# Patient Record
Sex: Male | Born: 1960 | Race: White | Hispanic: No | Marital: Married | Smoking: Never smoker
Health system: Southern US, Community
[De-identification: ages and names within clinical notes are randomized; demographics above are authoritative.]

## PROBLEM LIST (undated history)

## (undated) DIAGNOSIS — K219 Gastro-esophageal reflux disease without esophagitis: Secondary | ICD-10-CM

## (undated) DIAGNOSIS — I4719 Other supraventricular tachycardia: Secondary | ICD-10-CM

## (undated) DIAGNOSIS — J189 Pneumonia, unspecified organism: Secondary | ICD-10-CM

## (undated) DIAGNOSIS — Z9889 Other specified postprocedural states: Secondary | ICD-10-CM

## (undated) DIAGNOSIS — I7121 Aneurysm of the ascending aorta, without rupture: Secondary | ICD-10-CM

## (undated) DIAGNOSIS — M1990 Unspecified osteoarthritis, unspecified site: Secondary | ICD-10-CM

## (undated) DIAGNOSIS — I350 Nonrheumatic aortic (valve) stenosis: Secondary | ICD-10-CM

## (undated) DIAGNOSIS — N529 Male erectile dysfunction, unspecified: Secondary | ICD-10-CM

## (undated) DIAGNOSIS — I480 Paroxysmal atrial fibrillation: Secondary | ICD-10-CM

## (undated) DIAGNOSIS — Z8719 Personal history of other diseases of the digestive system: Secondary | ICD-10-CM

## (undated) DIAGNOSIS — G4733 Obstructive sleep apnea (adult) (pediatric): Secondary | ICD-10-CM

## (undated) DIAGNOSIS — B159 Hepatitis A without hepatic coma: Secondary | ICD-10-CM

## (undated) DIAGNOSIS — Z87442 Personal history of urinary calculi: Secondary | ICD-10-CM

## (undated) DIAGNOSIS — I10 Essential (primary) hypertension: Secondary | ICD-10-CM

## (undated) DIAGNOSIS — K8020 Calculus of gallbladder without cholecystitis without obstruction: Secondary | ICD-10-CM

## (undated) DIAGNOSIS — I2510 Atherosclerotic heart disease of native coronary artery without angina pectoris: Secondary | ICD-10-CM

## (undated) DIAGNOSIS — F909 Attention-deficit hyperactivity disorder, unspecified type: Secondary | ICD-10-CM

## (undated) DIAGNOSIS — R51 Headache: Secondary | ICD-10-CM

## (undated) DIAGNOSIS — R011 Cardiac murmur, unspecified: Secondary | ICD-10-CM

## (undated) DIAGNOSIS — I471 Supraventricular tachycardia: Secondary | ICD-10-CM

## (undated) DIAGNOSIS — I712 Thoracic aortic aneurysm, without rupture: Secondary | ICD-10-CM

## (undated) DIAGNOSIS — U071 COVID-19: Secondary | ICD-10-CM

## (undated) DIAGNOSIS — I509 Heart failure, unspecified: Secondary | ICD-10-CM

## (undated) DIAGNOSIS — I499 Cardiac arrhythmia, unspecified: Secondary | ICD-10-CM

## (undated) HISTORY — DX: Nonrheumatic aortic (valve) stenosis: I35.0

## (undated) HISTORY — DX: Hepatitis a without hepatic coma: B15.9

## (undated) HISTORY — PX: KNEE SURGERY: SHX244

## (undated) HISTORY — PX: OTHER SURGICAL HISTORY: SHX169

## (undated) HISTORY — DX: Calculus of gallbladder without cholecystitis without obstruction: K80.20

## (undated) HISTORY — DX: Obstructive sleep apnea (adult) (pediatric): G47.33

## (undated) HISTORY — PX: LUMBAR FUSION: SHX111

## (undated) HISTORY — DX: Thoracic aortic aneurysm, without rupture: I71.2

## (undated) HISTORY — PX: CEREBRAL ANEURYSM REPAIR: SHX164

## (undated) HISTORY — DX: Paroxysmal atrial fibrillation: I48.0

## (undated) HISTORY — DX: Aneurysm of the ascending aorta, without rupture: I71.21

## (undated) HISTORY — DX: Atherosclerotic heart disease of native coronary artery without angina pectoris: I25.10

---
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
## 1998-07-04 ENCOUNTER — Emergency Department (HOSPITAL_COMMUNITY): Admission: EM | Admit: 1998-07-04 | Discharge: 1998-07-04 | Payer: Self-pay | Admitting: Emergency Medicine

## 1998-07-29 ENCOUNTER — Encounter: Admission: RE | Admit: 1998-07-29 | Discharge: 1998-07-29 | Payer: Self-pay | Admitting: Infectious Diseases

## 1998-07-31 ENCOUNTER — Inpatient Hospital Stay (HOSPITAL_COMMUNITY): Admission: AD | Admit: 1998-07-31 | Discharge: 1998-08-05 | Payer: Self-pay | Admitting: Internal Medicine

## 1998-08-05 ENCOUNTER — Encounter: Payer: Self-pay | Admitting: Internal Medicine

## 1998-08-12 ENCOUNTER — Encounter: Payer: Self-pay | Admitting: Infectious Diseases

## 1998-08-12 ENCOUNTER — Encounter: Admission: RE | Admit: 1998-08-12 | Discharge: 1998-08-12 | Payer: Self-pay | Admitting: Infectious Diseases

## 1998-08-12 ENCOUNTER — Ambulatory Visit (HOSPITAL_COMMUNITY): Admission: RE | Admit: 1998-08-12 | Discharge: 1998-08-12 | Payer: Self-pay | Admitting: Infectious Diseases

## 1998-08-19 ENCOUNTER — Inpatient Hospital Stay (HOSPITAL_COMMUNITY): Admission: RE | Admit: 1998-08-19 | Discharge: 1998-08-20 | Payer: Self-pay | Admitting: *Deleted

## 1998-08-19 ENCOUNTER — Encounter: Payer: Self-pay | Admitting: *Deleted

## 1998-08-28 ENCOUNTER — Encounter: Admission: RE | Admit: 1998-08-28 | Discharge: 1998-08-28 | Payer: Self-pay | Admitting: Infectious Diseases

## 1998-09-06 ENCOUNTER — Encounter: Admission: RE | Admit: 1998-09-06 | Discharge: 1998-09-06 | Payer: Self-pay | Admitting: Infectious Diseases

## 1998-10-25 ENCOUNTER — Encounter: Admission: RE | Admit: 1998-10-25 | Discharge: 1998-10-25 | Payer: Self-pay | Admitting: Infectious Diseases

## 1998-10-28 ENCOUNTER — Encounter: Payer: Self-pay | Admitting: Infectious Diseases

## 1998-10-28 ENCOUNTER — Ambulatory Visit (HOSPITAL_COMMUNITY): Admission: RE | Admit: 1998-10-28 | Discharge: 1998-10-28 | Payer: Self-pay | Admitting: Infectious Diseases

## 1999-03-13 ENCOUNTER — Encounter: Admission: RE | Admit: 1999-03-13 | Discharge: 1999-03-13 | Payer: Self-pay | Admitting: Infectious Diseases

## 1999-03-14 ENCOUNTER — Encounter: Payer: Self-pay | Admitting: Infectious Diseases

## 1999-03-14 ENCOUNTER — Ambulatory Visit (HOSPITAL_COMMUNITY): Admission: RE | Admit: 1999-03-14 | Discharge: 1999-03-14 | Payer: Self-pay | Admitting: Infectious Diseases

## 2000-05-31 ENCOUNTER — Encounter: Admission: RE | Admit: 2000-05-31 | Discharge: 2000-05-31 | Payer: Self-pay | Admitting: Internal Medicine

## 2001-05-05 ENCOUNTER — Encounter: Admission: RE | Admit: 2001-05-05 | Discharge: 2001-05-05 | Payer: Self-pay | Admitting: Internal Medicine

## 2001-10-29 ENCOUNTER — Emergency Department (HOSPITAL_COMMUNITY): Admission: EM | Admit: 2001-10-29 | Discharge: 2001-10-29 | Payer: Self-pay | Admitting: *Deleted

## 2002-05-18 ENCOUNTER — Ambulatory Visit (HOSPITAL_COMMUNITY): Admission: RE | Admit: 2002-05-18 | Discharge: 2002-05-18 | Payer: Self-pay | Admitting: Neurosurgery

## 2002-05-18 ENCOUNTER — Encounter: Payer: Self-pay | Admitting: Neurosurgery

## 2002-07-05 ENCOUNTER — Encounter: Payer: Self-pay | Admitting: Neurosurgery

## 2002-07-05 ENCOUNTER — Inpatient Hospital Stay (HOSPITAL_COMMUNITY): Admission: RE | Admit: 2002-07-05 | Discharge: 2002-07-09 | Payer: Self-pay | Admitting: Neurosurgery

## 2002-09-28 ENCOUNTER — Encounter: Admission: RE | Admit: 2002-09-28 | Discharge: 2002-12-27 | Payer: Self-pay | Admitting: Neurosurgery

## 2004-04-25 ENCOUNTER — Encounter: Admission: RE | Admit: 2004-04-25 | Discharge: 2004-04-25 | Payer: Self-pay | Admitting: Internal Medicine

## 2004-07-04 ENCOUNTER — Ambulatory Visit: Payer: Self-pay | Admitting: Cardiovascular Disease

## 2004-10-10 ENCOUNTER — Ambulatory Visit: Payer: Self-pay

## 2004-10-24 ENCOUNTER — Ambulatory Visit: Payer: Self-pay | Admitting: Cardiovascular Disease

## 2004-11-26 ENCOUNTER — Emergency Department (HOSPITAL_COMMUNITY): Admission: EM | Admit: 2004-11-26 | Discharge: 2004-11-27 | Payer: Self-pay | Admitting: Emergency Medicine

## 2004-12-01 ENCOUNTER — Ambulatory Visit: Payer: Self-pay | Admitting: Cardiology

## 2004-12-09 ENCOUNTER — Ambulatory Visit: Payer: Self-pay

## 2004-12-09 ENCOUNTER — Ambulatory Visit: Payer: Self-pay | Admitting: Internal Medicine

## 2004-12-23 ENCOUNTER — Ambulatory Visit: Payer: Self-pay | Admitting: Internal Medicine

## 2004-12-29 ENCOUNTER — Ambulatory Visit: Payer: Self-pay | Admitting: Internal Medicine

## 2005-05-06 ENCOUNTER — Ambulatory Visit: Payer: Self-pay | Admitting: Cardiology

## 2005-05-14 ENCOUNTER — Ambulatory Visit: Payer: Self-pay | Admitting: Internal Medicine

## 2005-06-26 ENCOUNTER — Ambulatory Visit: Payer: Self-pay | Admitting: Pulmonary Disease

## 2005-06-29 ENCOUNTER — Ambulatory Visit (HOSPITAL_BASED_OUTPATIENT_CLINIC_OR_DEPARTMENT_OTHER): Admission: RE | Admit: 2005-06-29 | Discharge: 2005-06-29 | Payer: Self-pay | Admitting: Pulmonary Disease

## 2005-07-01 ENCOUNTER — Ambulatory Visit: Payer: Self-pay | Admitting: Pulmonary Disease

## 2005-07-03 ENCOUNTER — Ambulatory Visit: Payer: Self-pay | Admitting: Internal Medicine

## 2005-07-10 ENCOUNTER — Ambulatory Visit: Payer: Self-pay | Admitting: Cardiology

## 2005-07-17 ENCOUNTER — Ambulatory Visit: Payer: Self-pay | Admitting: Cardiology

## 2005-07-17 ENCOUNTER — Ambulatory Visit: Payer: Self-pay | Admitting: Pulmonary Disease

## 2005-09-11 ENCOUNTER — Ambulatory Visit: Payer: Self-pay | Admitting: Family Medicine

## 2005-10-02 ENCOUNTER — Ambulatory Visit: Payer: Self-pay | Admitting: Internal Medicine

## 2005-11-06 ENCOUNTER — Ambulatory Visit: Payer: Self-pay | Admitting: Internal Medicine

## 2005-11-13 ENCOUNTER — Ambulatory Visit: Payer: Self-pay | Admitting: Cardiovascular Disease

## 2005-11-19 ENCOUNTER — Ambulatory Visit: Payer: Self-pay | Admitting: Cardiology

## 2005-12-29 ENCOUNTER — Ambulatory Visit: Payer: Self-pay | Admitting: Internal Medicine

## 2005-12-30 ENCOUNTER — Encounter: Payer: Self-pay | Admitting: Internal Medicine

## 2006-01-08 ENCOUNTER — Ambulatory Visit: Payer: Self-pay | Admitting: Cardiovascular Disease

## 2006-01-28 ENCOUNTER — Ambulatory Visit: Payer: Self-pay | Admitting: Internal Medicine

## 2006-02-05 ENCOUNTER — Ambulatory Visit: Payer: Self-pay | Admitting: Cardiology

## 2006-02-12 ENCOUNTER — Ambulatory Visit: Payer: Self-pay | Admitting: Cardiology

## 2006-03-19 ENCOUNTER — Ambulatory Visit: Payer: Self-pay | Admitting: Internal Medicine

## 2006-04-13 ENCOUNTER — Ambulatory Visit: Payer: Self-pay | Admitting: Cardiology

## 2006-04-15 ENCOUNTER — Ambulatory Visit (HOSPITAL_COMMUNITY): Admission: RE | Admit: 2006-04-15 | Discharge: 2006-04-15 | Payer: Self-pay | Admitting: Neurosurgery

## 2006-04-19 ENCOUNTER — Ambulatory Visit: Payer: Self-pay

## 2006-04-19 ENCOUNTER — Ambulatory Visit: Payer: Self-pay | Admitting: Internal Medicine

## 2006-04-29 ENCOUNTER — Ambulatory Visit: Payer: Self-pay | Admitting: Internal Medicine

## 2006-05-07 ENCOUNTER — Ambulatory Visit: Payer: Self-pay | Admitting: Cardiology

## 2006-05-14 ENCOUNTER — Ambulatory Visit: Payer: Self-pay | Admitting: Cardiology

## 2006-05-21 ENCOUNTER — Ambulatory Visit: Payer: Self-pay | Admitting: Cardiovascular Disease

## 2006-06-07 ENCOUNTER — Ambulatory Visit: Payer: Self-pay | Admitting: Cardiovascular Disease

## 2006-06-07 ENCOUNTER — Ambulatory Visit: Payer: Self-pay | Admitting: Internal Medicine

## 2006-06-11 ENCOUNTER — Ambulatory Visit: Payer: Self-pay | Admitting: Pulmonary Disease

## 2006-06-25 ENCOUNTER — Ambulatory Visit: Payer: Self-pay | Admitting: Internal Medicine

## 2006-07-02 ENCOUNTER — Ambulatory Visit: Payer: Self-pay

## 2006-07-02 ENCOUNTER — Encounter: Payer: Self-pay | Admitting: Internal Medicine

## 2006-08-04 ENCOUNTER — Ambulatory Visit: Payer: Self-pay | Admitting: Cardiology

## 2006-10-08 ENCOUNTER — Ambulatory Visit: Payer: Self-pay | Admitting: Internal Medicine

## 2006-10-22 ENCOUNTER — Ambulatory Visit: Payer: Self-pay | Admitting: Internal Medicine

## 2006-11-12 ENCOUNTER — Ambulatory Visit: Payer: Self-pay | Admitting: Cardiovascular Disease

## 2006-11-12 ENCOUNTER — Ambulatory Visit: Payer: Self-pay | Admitting: Internal Medicine

## 2006-11-18 ENCOUNTER — Ambulatory Visit: Payer: Self-pay | Admitting: Cardiology

## 2006-11-18 LAB — CONVERTED CEMR LAB
INR: 4.4 — ABNORMAL HIGH (ref 0.9–2.0)
Prothrombin Time: 26.8 s — ABNORMAL HIGH (ref 10.0–14.0)

## 2007-03-17 ENCOUNTER — Ambulatory Visit: Payer: Self-pay | Admitting: Cardiology

## 2007-03-28 ENCOUNTER — Ambulatory Visit: Payer: Self-pay | Admitting: Internal Medicine

## 2007-03-28 ENCOUNTER — Ambulatory Visit: Payer: Self-pay | Admitting: Cardiology

## 2007-04-08 ENCOUNTER — Ambulatory Visit: Payer: Self-pay | Admitting: Internal Medicine

## 2007-04-08 ENCOUNTER — Ambulatory Visit: Payer: Self-pay | Admitting: Cardiology

## 2007-04-08 LAB — CONVERTED CEMR LAB
ALT: 30 units/L (ref 0–53)
AST: 22 units/L (ref 0–37)
Albumin: 4.2 g/dL (ref 3.5–5.2)
Alkaline Phosphatase: 60 units/L (ref 39–117)
BUN: 14 mg/dL (ref 6–23)
Bilirubin, Direct: 0.1 mg/dL (ref 0.0–0.3)
CO2: 30 meq/L (ref 19–32)
Calcium: 9.2 mg/dL (ref 8.4–10.5)
Chloride: 100 meq/L (ref 96–112)
Cholesterol: 242 mg/dL (ref 0–200)
Creatinine, Ser: 1.1 mg/dL (ref 0.4–1.5)
Direct LDL: 175 mg/dL
GFR calc Af Amer: 94 mL/min
GFR calc non Af Amer: 78 mL/min
Glucose, Bld: 105 mg/dL — ABNORMAL HIGH (ref 70–99)
HDL: 34.9 mg/dL — ABNORMAL LOW (ref >39.0)
Potassium: 4.9 meq/L (ref 3.5–5.1)
Sodium: 135 meq/L (ref 135–145)
Total Bilirubin: 2.1 mg/dL — ABNORMAL HIGH (ref 0.3–1.2)
Total CHOL/HDL Ratio: 6.9
Total Protein: 6.8 g/dL (ref 6.0–8.3)
Triglycerides: 145 mg/dL (ref 0–149)
VLDL: 29 mg/dL (ref 0–40)

## 2007-04-22 ENCOUNTER — Ambulatory Visit: Payer: Self-pay | Admitting: Cardiology

## 2007-05-06 ENCOUNTER — Ambulatory Visit: Payer: Self-pay | Admitting: Cardiology

## 2007-06-17 ENCOUNTER — Ambulatory Visit: Payer: Self-pay | Admitting: Cardiovascular Disease

## 2007-06-17 ENCOUNTER — Encounter: Payer: Self-pay | Admitting: Internal Medicine

## 2007-06-17 ENCOUNTER — Ambulatory Visit: Payer: Self-pay

## 2007-07-07 ENCOUNTER — Ambulatory Visit: Payer: Self-pay | Admitting: Internal Medicine

## 2007-07-07 LAB — CONVERTED CEMR LAB
ALT: 33 units/L (ref 0–53)
AST: 24 units/L (ref 0–37)
Albumin: 3.9 g/dL (ref 3.5–5.2)
Alkaline Phosphatase: 54 units/L (ref 39–117)
BUN: 15 mg/dL (ref 6–23)
Bilirubin, Direct: 0.1 mg/dL (ref 0.0–0.3)
CO2: 30 meq/L (ref 19–32)
Calcium: 9.4 mg/dL (ref 8.4–10.5)
Chloride: 107 meq/L (ref 96–112)
Cholesterol: 213 mg/dL (ref 0–200)
Creatinine, Ser: 1.1 mg/dL (ref 0.4–1.5)
Direct LDL: 148.3 mg/dL
GFR calc Af Amer: 94 mL/min
GFR calc non Af Amer: 77 mL/min
Glucose, Bld: 95 mg/dL (ref 70–99)
HDL: 33.3 mg/dL — ABNORMAL LOW (ref >39.0)
Potassium: 5.1 meq/L (ref 3.5–5.1)
Sodium: 141 meq/L (ref 135–145)
Total Bilirubin: 1.1 mg/dL (ref 0.3–1.2)
Total CHOL/HDL Ratio: 6.4
Total Protein: 6.6 g/dL (ref 6.0–8.3)
Triglycerides: 138 mg/dL (ref 0–149)
VLDL: 28 mg/dL (ref 0–40)

## 2007-07-13 ENCOUNTER — Ambulatory Visit: Payer: Self-pay | Admitting: Internal Medicine

## 2007-09-15 ENCOUNTER — Ambulatory Visit: Payer: Self-pay | Admitting: Internal Medicine

## 2007-10-14 ENCOUNTER — Ambulatory Visit: Payer: Self-pay | Admitting: Cardiology

## 2007-10-14 LAB — CONVERTED CEMR LAB
INR: 4.6 — ABNORMAL HIGH (ref 0.8–1.0)
Prothrombin Time: 27.5 s — ABNORMAL HIGH (ref 10.9–13.3)

## 2007-10-21 ENCOUNTER — Ambulatory Visit: Payer: Self-pay | Admitting: Internal Medicine

## 2007-10-21 ENCOUNTER — Encounter: Payer: Self-pay | Admitting: Internal Medicine

## 2007-10-21 ENCOUNTER — Ambulatory Visit: Payer: Self-pay

## 2007-10-21 LAB — CONVERTED CEMR LAB
ALT: 51 units/L (ref 0–53)
AST: 35 units/L (ref 0–37)
Albumin: 4.1 g/dL (ref 3.5–5.2)
Alkaline Phosphatase: 59 units/L (ref 39–117)
BUN: 14 mg/dL (ref 6–23)
Bilirubin, Direct: 0.2 mg/dL (ref 0.0–0.3)
CO2: 31 meq/L (ref 19–32)
Calcium: 9.4 mg/dL (ref 8.4–10.5)
Chloride: 107 meq/L (ref 96–112)
Cholesterol: 179 mg/dL (ref 0–200)
Creatinine, Ser: 1 mg/dL (ref 0.4–1.5)
GFR calc Af Amer: 104 mL/min
GFR calc non Af Amer: 86 mL/min
Glucose, Bld: 93 mg/dL (ref 70–99)
HDL: 36.2 mg/dL — ABNORMAL LOW (ref >39.0)
LDL Cholesterol: 119 mg/dL — ABNORMAL HIGH (ref 0–99)
Potassium: 4.4 meq/L (ref 3.5–5.1)
Sodium: 141 meq/L (ref 135–145)
Total Bilirubin: 1.7 mg/dL — ABNORMAL HIGH (ref 0.3–1.2)
Total CHOL/HDL Ratio: 4.9
Total Protein: 6.8 g/dL (ref 6.0–8.3)
Triglycerides: 118 mg/dL (ref 0–149)
VLDL: 24 mg/dL (ref 0–40)

## 2007-11-25 ENCOUNTER — Ambulatory Visit: Payer: Self-pay | Admitting: Cardiology

## 2007-12-26 ENCOUNTER — Ambulatory Visit: Payer: Self-pay | Admitting: Thoracic Surgery (Cardiothoracic Vascular Surgery)

## 2008-01-03 ENCOUNTER — Ambulatory Visit: Payer: Self-pay | Admitting: Internal Medicine

## 2008-01-03 LAB — CONVERTED CEMR LAB
BUN: 15 mg/dL (ref 6–23)
Basophils Absolute: 0 10*3/uL (ref 0.0–0.1)
Basophils Relative: 0 % (ref 0.0–1.0)
CO2: 31 meq/L (ref 19–32)
Calcium: 9.1 mg/dL (ref 8.4–10.5)
Chloride: 105 meq/L (ref 96–112)
Creatinine, Ser: 0.9 mg/dL (ref 0.4–1.5)
Eosinophils Absolute: 0.1 10*3/uL (ref 0.0–0.7)
Eosinophils Relative: 1.5 % (ref 0.0–5.0)
GFR calc Af Amer: 118 mL/min
GFR calc non Af Amer: 97 mL/min
Glucose, Bld: 100 mg/dL — ABNORMAL HIGH (ref 70–99)
HCT: 39.2 % (ref 39.0–52.0)
Hemoglobin: 13.3 g/dL (ref 13.0–17.0)
INR: 1.5 — ABNORMAL HIGH (ref 0.8–1.0)
Lymphocytes Relative: 39.9 % (ref 12.0–46.0)
MCHC: 33.9 g/dL (ref 30.0–36.0)
MCV: 91.7 fL (ref 78.0–100.0)
Monocytes Absolute: 0.2 10*3/uL (ref 0.1–1.0)
Monocytes Relative: 4.3 % (ref 3.0–12.0)
Neutro Abs: 2.7 10*3/uL (ref 1.4–7.7)
Neutrophils Relative %: 54.3 % (ref 43.0–77.0)
Platelets: 154 10*3/uL (ref 150–400)
Potassium: 4.4 meq/L (ref 3.5–5.1)
Prothrombin Time: 17.5 s — ABNORMAL HIGH (ref 10.9–13.3)
RBC: 4.28 M/uL (ref 4.22–5.81)
RDW: 12.1 % (ref 11.5–14.6)
Sodium: 141 meq/L (ref 135–145)
WBC: 5 10*3/uL (ref 4.5–10.5)
aPTT: 39.7 s — ABNORMAL HIGH (ref 21.7–29.8)

## 2008-01-05 ENCOUNTER — Encounter: Payer: Self-pay | Admitting: Thoracic Surgery (Cardiothoracic Vascular Surgery)

## 2008-01-05 ENCOUNTER — Ambulatory Visit: Payer: Self-pay | Admitting: Internal Medicine

## 2008-01-05 ENCOUNTER — Inpatient Hospital Stay (HOSPITAL_BASED_OUTPATIENT_CLINIC_OR_DEPARTMENT_OTHER): Admission: RE | Admit: 2008-01-05 | Discharge: 2008-01-05 | Payer: Self-pay | Admitting: Internal Medicine

## 2008-01-05 ENCOUNTER — Ambulatory Visit (HOSPITAL_COMMUNITY): Admission: RE | Admit: 2008-01-05 | Discharge: 2008-01-05 | Payer: Self-pay | Admitting: Internal Medicine

## 2008-01-09 ENCOUNTER — Ambulatory Visit: Payer: Self-pay | Admitting: Cardiology

## 2008-01-09 ENCOUNTER — Ambulatory Visit: Payer: Self-pay | Admitting: Thoracic Surgery (Cardiothoracic Vascular Surgery)

## 2008-01-09 ENCOUNTER — Encounter: Payer: Self-pay | Admitting: Internal Medicine

## 2008-01-09 LAB — CONVERTED CEMR LAB
INR: 1.3 — ABNORMAL HIGH (ref 0.8–1.0)
Prothrombin Time: 14.9 s — ABNORMAL HIGH (ref 10.9–13.3)

## 2008-01-12 ENCOUNTER — Ambulatory Visit: Payer: Self-pay | Admitting: Internal Medicine

## 2008-02-18 ENCOUNTER — Emergency Department (HOSPITAL_COMMUNITY): Admission: EM | Admit: 2008-02-18 | Discharge: 2008-02-18 | Payer: Self-pay | Admitting: Emergency Medicine

## 2008-03-13 ENCOUNTER — Ambulatory Visit: Payer: Self-pay | Admitting: Internal Medicine

## 2008-04-20 ENCOUNTER — Ambulatory Visit: Payer: Self-pay | Admitting: Internal Medicine

## 2008-06-15 ENCOUNTER — Ambulatory Visit: Payer: Self-pay | Admitting: Cardiology

## 2008-07-04 ENCOUNTER — Ambulatory Visit: Payer: Self-pay | Admitting: Internal Medicine

## 2008-07-04 LAB — CONVERTED CEMR LAB
ALT: 30 units/L (ref 0–53)
AST: 28 units/L (ref 0–37)
Albumin: 4.1 g/dL (ref 3.5–5.2)
Alkaline Phosphatase: 56 units/L (ref 39–117)
BUN: 20 mg/dL (ref 6–23)
Bilirubin, Direct: 0.3 mg/dL (ref 0.0–0.3)
CO2: 27 meq/L (ref 19–32)
CRP, High Sensitivity: <1 — ABNORMAL LOW (ref 0.00–5.00)
Calcium: 8.9 mg/dL (ref 8.4–10.5)
Chloride: 107 meq/L (ref 96–112)
Creatinine, Ser: 1.2 mg/dL (ref 0.4–1.5)
GFR calc Af Amer: 84 mL/min
GFR calc non Af Amer: 70 mL/min
Glucose, Bld: 123 mg/dL — ABNORMAL HIGH (ref 70–99)
Potassium: 4.2 meq/L (ref 3.5–5.1)
Sodium: 139 meq/L (ref 135–145)
Total Bilirubin: 1.5 mg/dL — ABNORMAL HIGH (ref 0.3–1.2)
Total Protein: 6.6 g/dL (ref 6.0–8.3)

## 2008-07-11 ENCOUNTER — Ambulatory Visit: Payer: Self-pay | Admitting: Internal Medicine

## 2008-07-11 ENCOUNTER — Encounter: Payer: Self-pay | Admitting: Internal Medicine

## 2008-07-11 ENCOUNTER — Ambulatory Visit: Payer: Self-pay

## 2008-07-11 ENCOUNTER — Ambulatory Visit: Payer: Self-pay | Admitting: Cardiology

## 2008-07-18 ENCOUNTER — Ambulatory Visit: Payer: Self-pay

## 2008-07-18 ENCOUNTER — Ambulatory Visit: Payer: Self-pay | Admitting: Cardiology

## 2008-07-18 ENCOUNTER — Ambulatory Visit: Payer: Self-pay | Admitting: Internal Medicine

## 2008-07-18 LAB — CONVERTED CEMR LAB
Cholesterol: 184 mg/dL (ref 0–200)
HDL: 37.4 mg/dL — ABNORMAL LOW (ref >39.0)
LDL Cholesterol: 123 mg/dL — ABNORMAL HIGH (ref 0–99)
Total CHOL/HDL Ratio: 4.9
Triglycerides: 116 mg/dL (ref 0–149)
VLDL: 23 mg/dL (ref 0–40)

## 2008-07-23 ENCOUNTER — Ambulatory Visit: Payer: Self-pay | Admitting: Thoracic Surgery (Cardiothoracic Vascular Surgery)

## 2008-07-23 ENCOUNTER — Encounter
Admission: RE | Admit: 2008-07-23 | Discharge: 2008-07-23 | Payer: Self-pay | Admitting: Thoracic Surgery (Cardiothoracic Vascular Surgery)

## 2008-10-18 ENCOUNTER — Ambulatory Visit: Payer: Self-pay | Admitting: Internal Medicine

## 2008-11-08 ENCOUNTER — Ambulatory Visit: Payer: Self-pay | Admitting: Cardiovascular Disease

## 2008-11-21 ENCOUNTER — Ambulatory Visit: Payer: Self-pay | Admitting: Internal Medicine

## 2008-11-21 DIAGNOSIS — R509 Fever, unspecified: Secondary | ICD-10-CM | POA: Insufficient documentation

## 2008-11-21 DIAGNOSIS — I330 Acute and subacute infective endocarditis: Secondary | ICD-10-CM | POA: Insufficient documentation

## 2008-11-21 DIAGNOSIS — R042 Hemoptysis: Secondary | ICD-10-CM | POA: Insufficient documentation

## 2008-11-21 DIAGNOSIS — Z954 Presence of other heart-valve replacement: Secondary | ICD-10-CM | POA: Insufficient documentation

## 2008-11-22 ENCOUNTER — Ambulatory Visit: Payer: Self-pay | Admitting: Cardiology

## 2008-12-03 ENCOUNTER — Ambulatory Visit: Payer: Self-pay | Admitting: Cardiology

## 2008-12-03 LAB — CONVERTED CEMR LAB
INR: 6.9 (ref 0.8–1.0)
Prothrombin Time: 67.5 s (ref 10.9–13.3)

## 2008-12-12 ENCOUNTER — Ambulatory Visit: Payer: Self-pay | Admitting: Cardiovascular Disease

## 2008-12-28 ENCOUNTER — Ambulatory Visit: Payer: Self-pay | Admitting: Cardiology

## 2009-01-11 ENCOUNTER — Ambulatory Visit: Payer: Self-pay | Admitting: Cardiology

## 2009-01-25 ENCOUNTER — Ambulatory Visit: Payer: Self-pay | Admitting: Cardiology

## 2009-01-25 LAB — CONVERTED CEMR LAB
POC INR: 2.3
Protime: 18.5

## 2009-01-29 ENCOUNTER — Encounter: Payer: Self-pay | Admitting: *Deleted

## 2009-03-06 ENCOUNTER — Encounter: Payer: Self-pay | Admitting: *Deleted

## 2009-03-12 ENCOUNTER — Encounter: Payer: Self-pay | Admitting: Internal Medicine

## 2009-03-12 ENCOUNTER — Telehealth: Payer: Self-pay | Admitting: Internal Medicine

## 2009-03-13 ENCOUNTER — Ambulatory Visit: Payer: Self-pay | Admitting: Vascular Surgery

## 2009-03-13 ENCOUNTER — Emergency Department (HOSPITAL_COMMUNITY): Admission: EM | Admit: 2009-03-13 | Discharge: 2009-03-13 | Payer: Self-pay | Admitting: Emergency Medicine

## 2009-03-14 ENCOUNTER — Encounter: Payer: Self-pay | Admitting: Cardiology

## 2009-03-14 ENCOUNTER — Ambulatory Visit: Payer: Self-pay | Admitting: Internal Medicine

## 2009-03-14 ENCOUNTER — Telehealth (INDEPENDENT_AMBULATORY_CARE_PROVIDER_SITE_OTHER): Payer: Self-pay | Admitting: Cardiology

## 2009-03-14 DIAGNOSIS — M79609 Pain in unspecified limb: Secondary | ICD-10-CM | POA: Insufficient documentation

## 2009-03-14 LAB — CONVERTED CEMR LAB
INR: 3
POC INR: 4.8

## 2009-03-15 ENCOUNTER — Telehealth (INDEPENDENT_AMBULATORY_CARE_PROVIDER_SITE_OTHER): Payer: Self-pay | Admitting: *Deleted

## 2009-04-19 ENCOUNTER — Ambulatory Visit: Payer: Self-pay | Admitting: Cardiovascular Disease

## 2009-04-19 LAB — CONVERTED CEMR LAB: POC INR: 4.1

## 2009-05-23 ENCOUNTER — Ambulatory Visit: Payer: Self-pay | Admitting: Cardiology

## 2009-05-23 LAB — CONVERTED CEMR LAB: POC INR: 4.7

## 2009-06-19 ENCOUNTER — Encounter (INDEPENDENT_AMBULATORY_CARE_PROVIDER_SITE_OTHER): Payer: Self-pay | Admitting: Cardiology

## 2009-07-04 ENCOUNTER — Ambulatory Visit: Payer: Self-pay | Admitting: Internal Medicine

## 2009-07-15 ENCOUNTER — Encounter: Payer: Self-pay | Admitting: Internal Medicine

## 2009-07-15 ENCOUNTER — Ambulatory Visit: Payer: Self-pay | Admitting: Thoracic Surgery (Cardiothoracic Vascular Surgery)

## 2009-07-15 ENCOUNTER — Encounter
Admission: RE | Admit: 2009-07-15 | Discharge: 2009-07-15 | Payer: Self-pay | Admitting: Thoracic Surgery (Cardiothoracic Vascular Surgery)

## 2009-08-02 ENCOUNTER — Ambulatory Visit: Payer: Self-pay | Admitting: Cardiology

## 2009-08-02 LAB — CONVERTED CEMR LAB: POC INR: 3.9

## 2009-08-06 ENCOUNTER — Ambulatory Visit: Payer: Self-pay | Admitting: Internal Medicine

## 2009-08-06 DIAGNOSIS — N529 Male erectile dysfunction, unspecified: Secondary | ICD-10-CM | POA: Insufficient documentation

## 2009-09-20 ENCOUNTER — Ambulatory Visit: Payer: Self-pay | Admitting: Cardiovascular Disease

## 2009-09-20 LAB — CONVERTED CEMR LAB: POC INR: 4

## 2009-11-01 ENCOUNTER — Ambulatory Visit: Payer: Self-pay | Admitting: Cardiovascular Disease

## 2009-11-01 LAB — CONVERTED CEMR LAB: POC INR: 3.4

## 2009-11-29 ENCOUNTER — Ambulatory Visit: Payer: Self-pay

## 2009-11-29 LAB — CONVERTED CEMR LAB: POC INR: 3.3

## 2010-02-05 ENCOUNTER — Encounter (INDEPENDENT_AMBULATORY_CARE_PROVIDER_SITE_OTHER): Payer: Self-pay | Admitting: *Deleted

## 2010-02-25 ENCOUNTER — Ambulatory Visit: Payer: Self-pay | Admitting: Cardiology

## 2010-02-25 LAB — CONVERTED CEMR LAB: POC INR: 4.4

## 2010-04-15 ENCOUNTER — Ambulatory Visit: Payer: Self-pay | Admitting: Internal Medicine

## 2010-04-15 ENCOUNTER — Encounter: Payer: Self-pay | Admitting: Internal Medicine

## 2010-04-15 LAB — CONVERTED CEMR LAB: POC INR: 3.6

## 2010-04-16 ENCOUNTER — Encounter: Payer: Self-pay | Admitting: Internal Medicine

## 2010-04-16 ENCOUNTER — Emergency Department (HOSPITAL_COMMUNITY): Admission: EM | Admit: 2010-04-16 | Discharge: 2010-04-17 | Payer: Self-pay | Admitting: Emergency Medicine

## 2010-04-17 LAB — CONVERTED CEMR LAB
ALT: 34 units/L (ref 0–53)
AST: 27 units/L (ref 0–37)
Albumin: 4.1 g/dL (ref 3.5–5.2)
Alkaline Phosphatase: 55 units/L (ref 39–117)
BUN: 15 mg/dL (ref 6–23)
Bilirubin, Direct: 0.1 mg/dL (ref 0.0–0.3)
CO2: 30 meq/L (ref 19–32)
Calcium: 8.8 mg/dL (ref 8.4–10.5)
Chloride: 105 meq/L (ref 96–112)
Cholesterol: 183 mg/dL (ref 0–200)
Creatinine, Ser: 0.9 mg/dL (ref 0.4–1.5)
GFR calc non Af Amer: 91.5 mL/min (ref >60)
Glucose, Bld: 91 mg/dL (ref 70–99)
HDL: 36 mg/dL — ABNORMAL LOW (ref >39.00)
INR: 3.3 — ABNORMAL HIGH (ref 0.8–1.0)
LDL Cholesterol: 126 mg/dL — ABNORMAL HIGH (ref 0–99)
Potassium: 4.6 meq/L (ref 3.5–5.1)
Prothrombin Time: 35.4 s — ABNORMAL HIGH (ref 9.7–11.8)
Sodium: 141 meq/L (ref 135–145)
Total Bilirubin: 1.2 mg/dL (ref 0.3–1.2)
Total CHOL/HDL Ratio: 5
Total Protein: 6.4 g/dL (ref 6.0–8.3)
Triglycerides: 106 mg/dL (ref 0.0–149.0)
VLDL: 21.2 mg/dL (ref 0.0–40.0)

## 2010-04-18 ENCOUNTER — Telehealth: Payer: Self-pay | Admitting: Internal Medicine

## 2010-04-18 ENCOUNTER — Ambulatory Visit: Payer: Self-pay | Admitting: Internal Medicine

## 2010-04-18 DIAGNOSIS — R519 Headache, unspecified: Secondary | ICD-10-CM | POA: Insufficient documentation

## 2010-04-18 DIAGNOSIS — N10 Acute tubulo-interstitial nephritis: Secondary | ICD-10-CM | POA: Insufficient documentation

## 2010-04-18 DIAGNOSIS — R51 Headache: Secondary | ICD-10-CM | POA: Insufficient documentation

## 2010-04-23 ENCOUNTER — Ambulatory Visit: Payer: Self-pay | Admitting: Internal Medicine

## 2010-04-23 LAB — CONVERTED CEMR LAB
INR: 2.1
INR: 2.1 — ABNORMAL HIGH (ref 0.8–1.0)
POC INR: 7
Prothrombin Time: 22 s — ABNORMAL HIGH (ref 9.7–11.8)

## 2010-04-28 ENCOUNTER — Ambulatory Visit: Payer: Self-pay | Admitting: Family Medicine

## 2010-04-28 DIAGNOSIS — S93409A Sprain of unspecified ligament of unspecified ankle, initial encounter: Secondary | ICD-10-CM | POA: Insufficient documentation

## 2010-04-28 LAB — CONVERTED CEMR LAB
Bilirubin Urine: NEGATIVE
Blood in Urine, dipstick: NEGATIVE
Glucose, Urine, Semiquant: NEGATIVE
Nitrite: NEGATIVE
Protein, U semiquant: NEGATIVE
Specific Gravity, Urine: >=1.03
Urobilinogen, UA: 0.2
WBC Urine, dipstick: NEGATIVE
pH: 5

## 2010-05-10 ENCOUNTER — Emergency Department (HOSPITAL_COMMUNITY): Admission: EM | Admit: 2010-05-10 | Discharge: 2010-05-10 | Payer: Self-pay | Admitting: Emergency Medicine

## 2010-05-13 ENCOUNTER — Telehealth: Payer: Self-pay | Admitting: Internal Medicine

## 2010-05-13 ENCOUNTER — Observation Stay (HOSPITAL_COMMUNITY): Admission: EM | Admit: 2010-05-13 | Discharge: 2010-05-13 | Payer: Self-pay | Admitting: Emergency Medicine

## 2010-05-13 ENCOUNTER — Ambulatory Visit: Payer: Self-pay | Admitting: Cardiovascular Disease

## 2010-05-21 ENCOUNTER — Ambulatory Visit: Payer: Self-pay | Admitting: Internal Medicine

## 2010-05-21 DIAGNOSIS — I4891 Unspecified atrial fibrillation: Secondary | ICD-10-CM | POA: Insufficient documentation

## 2010-05-22 ENCOUNTER — Telehealth (INDEPENDENT_AMBULATORY_CARE_PROVIDER_SITE_OTHER): Payer: Self-pay | Admitting: *Deleted

## 2010-05-23 ENCOUNTER — Ambulatory Visit: Payer: Self-pay | Admitting: Internal Medicine

## 2010-05-27 ENCOUNTER — Telehealth: Payer: Self-pay | Admitting: Internal Medicine

## 2010-06-04 ENCOUNTER — Telehealth: Payer: Self-pay | Admitting: Internal Medicine

## 2010-06-09 ENCOUNTER — Ambulatory Visit: Payer: Self-pay | Admitting: Cardiology

## 2010-06-09 ENCOUNTER — Encounter: Admission: RE | Admit: 2010-06-09 | Discharge: 2010-06-09 | Payer: Self-pay | Admitting: Orthopedic Surgery

## 2010-06-09 LAB — CONVERTED CEMR LAB: POC INR: 1.1

## 2010-06-10 ENCOUNTER — Encounter: Admission: RE | Admit: 2010-06-10 | Discharge: 2010-06-10 | Payer: Self-pay | Admitting: Orthopedic Surgery

## 2010-06-13 ENCOUNTER — Telehealth: Payer: Self-pay | Admitting: Pulmonary Disease

## 2010-06-16 ENCOUNTER — Ambulatory Visit: Payer: Self-pay | Admitting: Cardiology

## 2010-06-16 LAB — CONVERTED CEMR LAB: POC INR: 2.2

## 2010-06-19 ENCOUNTER — Ambulatory Visit: Payer: Self-pay | Admitting: Internal Medicine

## 2010-06-19 LAB — CONVERTED CEMR LAB: POC INR: 3.2

## 2010-07-01 ENCOUNTER — Telehealth: Payer: Self-pay | Admitting: Pulmonary Disease

## 2010-07-04 ENCOUNTER — Ambulatory Visit: Payer: Self-pay | Admitting: Cardiology

## 2010-07-04 LAB — CONVERTED CEMR LAB: POC INR: 2.9

## 2010-07-10 ENCOUNTER — Telehealth: Payer: Self-pay | Admitting: Internal Medicine

## 2010-07-18 ENCOUNTER — Telehealth (INDEPENDENT_AMBULATORY_CARE_PROVIDER_SITE_OTHER): Payer: Self-pay | Admitting: Pharmacist

## 2010-07-28 ENCOUNTER — Ambulatory Visit: Payer: Self-pay | Admitting: Thoracic Surgery (Cardiothoracic Vascular Surgery)

## 2010-07-28 ENCOUNTER — Encounter
Admission: RE | Admit: 2010-07-28 | Discharge: 2010-07-28 | Payer: Self-pay | Admitting: Thoracic Surgery (Cardiothoracic Vascular Surgery)

## 2010-07-28 ENCOUNTER — Encounter: Payer: Self-pay | Admitting: Internal Medicine

## 2010-07-28 ENCOUNTER — Ambulatory Visit: Payer: Self-pay | Admitting: Cardiology

## 2010-07-28 LAB — CONVERTED CEMR LAB: POC INR: 4.5

## 2010-08-18 ENCOUNTER — Ambulatory Visit: Payer: Self-pay

## 2010-09-19 ENCOUNTER — Telehealth: Payer: Self-pay | Admitting: Internal Medicine

## 2010-09-21 ENCOUNTER — Encounter: Payer: Self-pay | Admitting: Internal Medicine

## 2010-09-26 ENCOUNTER — Ambulatory Visit: Admit: 2010-09-26 | Payer: Self-pay

## 2010-10-02 NOTE — Letter (Signed)
Summary: [facility]  [facility]   Imported By: Lanelle Bal 07/31/2009 11:47:19  _____________________________________________________________________  External Attachment:    Type:   Image     Comment:   External Document

## 2010-10-02 NOTE — Letter (Signed)
Summary: Appointment - Missed  [facility], Main Office  [address]   [place], [place] [address]   Phone: [phone]  Fax: [phone]     February 05, 2010 MRN: [id number]   NIKAN ELLINGSON [address] [place], [place]  [address]   Dear Mr. [name],  Our records indicate you missed your appointment on 01/31/2010 with Dr.  Gala Romney. It is very important that we reach you to reschedule this appointment. We look forward to participating in your health care needs. Please contact us at the number listed above at your earliest convenience to reschedule this appointment.     Sincerely,   Migdalia Dk [facility] Scheduling Team

## 2010-10-02 NOTE — Progress Notes (Signed)
Summary: couamdin  Phone Note Call from Patient Call back at Home Phone [phone]   Caller: Patient Reason for Call: Talk to Nurse Summary of Call: pt was seen in er last night was told to call and speak with Latrenda Irani regarding his coumadin  Initial call taken by: Lorne Skeens,  March 14, 2009 10:06 AM  Follow-up for Phone Call        Spoke with pt. dosing done, see coumacare note in EMR. Bethena Midget, RN, BSN  March 14, 2009 11:01 AM
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
## 2010-10-02 NOTE — Medication Information (Signed)
Summary: rov/tm  Anticoagulant Therapy  Managed by: Cloyde Reams, RN, BSN Referring MD: Shirlee Limerick Supervising MD: Eden Emms MD, Theron Arista Indication 1: Aortic Valve Disorder (ICD-424.1) Indication 2: Aortic Valve Replacement (ICD-V43.3) Lab Used: [facility] Site: [address] INR POC 3.4 INR RANGE 3.5 - 4.5  Dietary changes: no    Health status changes: no    Bleeding/hemorrhagic complications: no    Recent/future hospitalizations: no    Any changes in medication regimen? no    Recent/future dental: no  Any missed doses?: yes     Details: Missed 1 dose Wednesday night.    Is patient compliant with meds? yes       Allergies (verified): No Known Drug Allergies  Anticoagulation Management History:      The patient is taking warfarin and comes in today for a routine follow up visit.  Negative risk factors for bleeding include an age less than 25 years old.  The bleeding index is 'low risk'.  Negative CHADS2 values include Age > 32 years old.  The start date was 05/15/1982.  His last INR was 3.0.  Anticoagulation responsible provider: Eden Emms MD, Theron Arista.  INR POC: 3.4.  Cuvette Lot#: [id number].  Exp: 11/2010.    Anticoagulation Management Assessment/Plan:      The patient's current anticoagulation dose is Coumadin 10 mg tabs: Take as directed by coumadin clinic..  The target INR is 3.5-4.5.  The next INR is due 11/29/2009.  Anticoagulation instructions were given to patient.  Results were reviewed/authorized by Cloyde Reams, RN, BSN.  He was notified by Cloyde Reams RN.         Prior Anticoagulation Instructions: INR 4.0  Continue on same dosage 15mg  daily except 10mg  on Thursdays.  Recheck in 4 weeks.    Current Anticoagulation Instructions: INR 3.4  Take 2 tablets today then resume same dosage 1.5 tablets daily except 1 tablet on Thursdays.  Recheck in 4 weeks.

## 2010-10-02 NOTE — Medication Information (Signed)
Summary: rov/ewj  Anticoagulant Therapy  Managed by: Cloyde Reams, RN, BSN Referring MD: Shirlee Limerick PCP: none Supervising MD: Myrtis Ser MD, Tinnie Gens Indication 1: Aortic Valve Disorder (ICD-424.1) Indication 2: Aortic Valve Replacement (ICD-V43.3) Lab Used: [facility] Site: [address] INR POC 2.9 INR RANGE 3.5 - 4.5  Dietary changes: no    Health status changes: no    Bleeding/hemorrhagic complications: no    Recent/future hospitalizations: no    Any changes in medication regimen? no    Recent/future dental: no  Any missed doses?: no       Is patient compliant with meds? yes       Allergies: No Known Drug Allergies  Anticoagulation Management History:      The patient is taking warfarin and comes in today for a routine follow up visit.  Negative risk factors for bleeding include an age less than 22 years old.  The bleeding index is 'low risk'.  Negative CHADS2 values include Age > 27 years old.  The start date was 05/15/1982.  His last INR was 2.1 ratio.  Anticoagulation responsible provider: Myrtis Ser MD, Tinnie Gens.  INR POC: 2.9.  Cuvette Lot#: [id number].  Exp: 08/2011.    Anticoagulation Management Assessment/Plan:      The patient's current anticoagulation dose is Coumadin 10 mg tabs: Take as directed by coumadin clinic..  The target INR is 3.5-4.5.  The next INR is due 07/18/2010.  Anticoagulation instructions were given to patient.  Results were reviewed/authorized by Cloyde Reams, RN, BSN.  He was notified by Cloyde Reams, RN, BSN.         Prior Anticoagulation Instructions: INR 3.2  Take 20mg  today, then resume same dosage 15mg  daily except 10mg  on Thursdays.  Recheck in 2 weeks.    Current Anticoagulation Instructions: INR 2.9  Take 2 tablets today, then start taking 1.5 tablets daily.  Recheck in 2 weeks.

## 2010-10-02 NOTE — Progress Notes (Signed)
Summary: Bactrim start/Coumadin refill  Phone Note Call from Patient   Caller: Patient Call For: Coumadin Clinic Summary of Call: Pt called diagnosed with UTI on Wednesday in hospital, given 1 dose of Bactrim Wednesday in hospital, discharged with rx for Bactrim x 10 days started yesterday 04/17/10.  Pt states he has been out of it and has not taken his coumadin x 2 days.  Advised pt to resume normal dosage of Coumadin since he has missed a couple of doses at least.  Pt seeing Dr Gala Romney on 04/23/10 at 3:15pm, made appt to check Coumadin same day prior to Dr Gala Romney appt.  Pt also needs a refill of his Coumadin sent to [facility].   Initial call taken by: Cloyde Reams RN,  April 18, 2010 8:49 AM    Prescriptions: COUMADIN 10 MG TABS (WARFARIN SODIUM) Take as directed by coumadin clinic.  #50 x 1   Entered by:   Cloyde Reams RN   Authorized by:   Dolores Patty, MD, [facility]   Signed by:   Cloyde Reams RN on 04/18/2010   Method used:   Electronically to        [facility] (retail)       [address]       [place], [place]  [address]       Ph: [phone]       Fax: [phone]   RxID:   [id number]

## 2010-10-02 NOTE — Progress Notes (Signed)
Summary: 48 hr Holter monitor  Phone Note Outgoing Call Call back at [facility] Phone [phone]   Call placed by: Stanton Kidney, EMT-P,  May 22, 2010 1:55 PM Call placed to: Patient Action Taken: Phone Call Completed Summary of Call: Pt scheduled 05/23/10 10:00 am for 48 hr Holter monitor. Stanton Kidney, EMT-P  May 22, 2010 1:55 PM
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
## 2010-10-02 NOTE — Medication Information (Signed)
Summary: rov/ewj  Anticoagulant Therapy  Managed by: Cloyde Reams, RN, BSN Referring MD: Shirlee Limerick Supervising MD: Eden Emms MD, Theron Arista Indication 1: Aortic Valve Disorder (ICD-424.1) Indication 2: Aortic Valve Replacement (ICD-V43.3) Lab Used: [facility] [place] Site: [address] INR POC 4.0 INR RANGE 3.5 - 4.5  Dietary changes: no    Health status changes: no    Bleeding/hemorrhagic complications: no    Recent/future hospitalizations: no    Any changes in medication regimen? no    Recent/future dental: no  Any missed doses?: yes     Details: Missed just a couple doses approx 10 days ago  Is patient compliant with meds? yes       Allergies (verified): No Known Drug Allergies  Anticoagulation Management History:      The patient is taking warfarin and comes in today for a routine follow up visit.  Negative risk factors for bleeding include an age less than 32 years old.  The bleeding index is 'low risk'.  Negative CHADS2 values include Age > 7 years old.  The start date was 05/15/1982.  His last INR was 3.0.  Anticoagulation responsible provider: Eden Emms MD, Theron Arista.  INR POC: 4.0.  Cuvette Lot#: [id number].  Exp: 10/2010.    Anticoagulation Management Assessment/Plan:      The patient's current anticoagulation dose is Coumadin 10 mg tabs: Take as directed by coumadin clinic..  The target INR is 3.5-4.5.  The next INR is due 10/18/2009.  Anticoagulation instructions were given to patient.  Results were reviewed/authorized by Cloyde Reams, RN, BSN.  He was notified by Cloyde Reams RN.         Prior Anticoagulation Instructions: INR 3.9 Contine 15mg s everyday except 10mg s on Thursdays. Recheck in 4 weeks.   Current Anticoagulation Instructions: INR 4.0  Continue on same dosage 15mg  daily except 10mg  on Thursdays.  Recheck in 4 weeks.   Prescriptions: COUMADIN 10 MG TABS (WARFARIN SODIUM) Take as directed by coumadin clinic.  #50 x 1   Entered by:   Cloyde Reams RN   Authorized by:   Dolores Patty, MD, [facility]   Signed by:   Cloyde Reams RN on 09/20/2009   Method used:   Electronically to        [facility] (retail)       [address]       [place], [place]  [address]       Ph: [phone]       Fax: [phone]   RxID:   [id number]

## 2010-10-02 NOTE — Medication Information (Signed)
Summary: on Bactrim/seeing DB at 3:15pm/ewj  Anticoagulant Therapy  Managed by: Weston Brass, PharmD Referring MD: Shirlee Limerick Supervising MD: Johney Frame MD, Fayrene Fearing Indication 1: Aortic Valve Disorder (ICD-424.1) Indication 2: Aortic Valve Replacement (ICD-V43.3) Lab Used: [facility] [place] [facility] Site: [address] INR POC 7.0 INR RANGE 3.5 - 4.5  Dietary changes: yes       Details: not eating much  Health status changes: no    Bleeding/hemorrhagic complications: no    Recent/future hospitalizations: no    Any changes in medication regimen? yes       Details: on bactrim x 1 more week  Recent/future dental: no  Any missed doses?: yes     Details: missed 2 doses last night  Is patient compliant with meds? yes       Allergies: No Known Drug Allergies  Anticoagulation Management History:      The patient is taking warfarin and comes in today for a routine follow up visit.  Negative risk factors for bleeding include an age less than 60 years old.  The bleeding index is 'low risk'.  Negative CHADS2 values include Age > 57 years old.  The start date was 05/15/1982.  His last INR was 3.3 ratio and today's INR is 2.1.  Anticoagulation responsible provider: Judas Mohammad MD, Fayrene Fearing.  INR POC: 7.0.  Exp: 06/2011.    Anticoagulation Management Assessment/Plan:      The patient's current anticoagulation dose is Coumadin 10 mg tabs: Take as directed by coumadin clinic..  The target INR is 3.5-4.5.  The next INR is due 05/02/2010.  Anticoagulation instructions were given to patient.  Results were reviewed/authorized by Weston Brass, PharmD.  He was notified by Weston Brass PharmD.         Prior Anticoagulation Instructions: INR 3.6 Continue 15mg s daily except 10mg s on Thursdays. Recheck in 4 weeks.   Current Anticoagulation Instructions: INR POC 7.0  to lab  INR from lab: 2.1  Spoke with pt.  He held his Coumadin last night.  Take 2 tablets today then resume same dose of 1 1/2 tablets every day  except 1 tablet on Thursday.  Recheck INR in 1 week.

## 2010-10-02 NOTE — Medication Information (Signed)
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 Site: [address] INR POC 4.8 INR RANGE 3.5 - 4.5  Dietary changes: no    Health status changes: yes       Details: Swelling in Rt. calf  Bleeding/hemorrhagic complications: yes       Details: Bruise at bend of Rt. knee & inside of rt. calf  Recent/future hospitalizations: yes       Details: Went to ER yesterday  Any changes in medication regimen? no    Recent/future dental: no  Any missed doses?: no       Is patient compliant with meds? yes      Comments: Pt. states he drove to [place] 10-12hrs car ride and he stopped maybe every 4-5hrs. After returning he started having swelling an pain in his calf. Thus, went to ER yesterday. He has follow up wtih PCP today, to get U/S ordered.   Allergies: No Known Drug Allergies  Anticoagulation Management History:      His anticoagulation is being managed by telephone today.  Negative risk factors for bleeding include an age less than 26 years old.  The bleeding index is 'low risk'.  Negative CHADS2 values include Age > 45 years old.  The start date was 05/15/1982.  His last INR was 6.9 ratio.  Anticoagulation responsible provider: Antoine Poche MD, Fayrene Fearing.  INR POC: 4.8.    Anticoagulation Management Assessment/Plan:      The patient's current anticoagulation dose is Coumadin 10 mg tabs: Sunday - 1.5 tabs, Monday - 1.5 tabs, Tuesday - 1.5 tabs, Wednesday - 1.5 tabs, Thursday - 1.5 tabs, Friday - 1.5 tabs, Saturday - 1.5 tabs.  The target INR is 3.5-4.5.  Anticoagulation instructions were given to patient.  Results were reviewed/authorized by Bethena Midget, RN, BSN.  He was notified by Bethena Midget, RN, BSN.         Prior Anticoagulation Instructions: 20 mg x 2  days, then 15 mg daily  Current Anticoagulation Instructions: pt. states his dosage is 15mg s daily except 10mg s on Thursdays. Per ER instructions pt. held coumadin yesterday, he's aware to resume regular dose and 2wks follow appt. was scheduled.

## 2010-10-02 NOTE — Medication Information (Signed)
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 no    Recent/future dental: no  Any missed doses?: no       Is patient compliant with meds? yes       Allergies: No Known Drug Allergies  Anticoagulation Management History:      The patient is taking warfarin and comes in today for a routine follow up visit.  Negative risk factors for bleeding include an age less than 61 years old.  The bleeding index is 'low risk'.  Negative CHADS2 values include Age > 15 years old.  The start date was 05/15/1982.  His last INR was 2.1 ratio.  Anticoagulation responsible provider: Riley Kill MD, Maisie Fus.  INR POC: 4.5.  Cuvette Lot#: [id number].  Exp: 08/2011.    Anticoagulation Management Assessment/Plan:      The patient's current anticoagulation dose is Coumadin 10 mg tabs: Take as directed by coumadin clinic..  The target INR is 3.5-4.5.  The next INR is due 08/18/2010.  Anticoagulation instructions were given to patient.  Results were reviewed/authorized by Weston Brass, PharmD.  He was notified by Weston Brass PharmD.         Prior Anticoagulation Instructions: INR 2.9  Take 2 tablets today, then start taking 1.5 tablets daily.  Recheck in 2 weeks.   Current Anticoagulation Instructions: INR 4.5  Continue same dose of 1 1/2 tablets every day except 1 tablet on Thursday.  Recheck INR in 3 weeks.

## 2010-10-02 NOTE — Medication Information (Signed)
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 yes       Details: on Lovenox bridge  Recent/future dental: no  Any missed doses?: yes     Details: Holding Coumadin since Wednesday for arthrogram  Is patient compliant with meds? yes       Allergies: No Known Drug Allergies  Anticoagulation Management History:      Negative risk factors for bleeding include an age less than 58 years old.  The bleeding index is 'low risk'.  Negative CHADS2 values include Age > 43 years old.  The start date was 05/15/1982.  His last INR was 2.1 ratio.  Anticoagulation responsible provider: Antoine Poche MD, Fayrene Fearing.  INR POC: 1.1.  Exp: 06/2011.    Anticoagulation Management Assessment/Plan:      The patient's current anticoagulation dose is Coumadin 10 mg tabs: Take as directed by coumadin clinic..  The target INR is 3.5-4.5.  The next INR is due 06/16/2010.  Anticoagulation instructions were given to patient.  Results were reviewed/authorized by Weston Brass, PharmD.  He was notified by Weston Brass PharmD.         Prior Anticoagulation Instructions: INR POC 7.0  to lab  INR from lab: 2.1  Spoke with pt.  He held his Coumadin last night.  Take 2 tablets today then resume same dose of 1 1/2 tablets every day except 1 tablet on Thursday.  Recheck INR in 1 week.   Current Anticoagulation Instructions: INR 1.1  Continue previous directions for Lovenox bridging and restarting Coumadin.

## 2010-10-02 NOTE — Assessment & Plan Note (Signed)
Summary: SWEATS & CHILLS/RH......   Vital Signs:  Patient profile:   50 year old male Temp:     98.8 degrees F Pulse rate:   70 / minute Resp:     17 per minute BP sitting:   124 / 86 CC: Post ER visit./kb, Headaches Is Patient Diabetic? No Pain Assessment Patient in pain? yes     Location: headache Intensity: 10 Type: heaviness Comments Patient c/o severe HA, fever, chills, balance issues, and blurry vision. Patient denies SOB and chest,abd pain./kb   CC:  Post ER visit./kb and Headaches.  History of Present Illness: He continues to be nauseated & has a persistent headache since the ER visit 08/17 for Pyelonephritis. Record reviewed : WBC 11.7,21-50 WBCs in urine, large leuks, & many bacteria. Only 0-2 RBCs in  urine(on coumadin).Urine sent for C&S 04/16/2010@ 2200 hrs.PMH of renal calculi X 3; this episode was unlike those. E Chart reviewed & [facility] called; C&S results not complete @ time of this visit.   The patient reports nausea & vomiting X 1 today, and sweats, but denies tearing of eyes, nasal congestion, sinus pain, sinus pressure, photophobia, and phonophobia.  The headache is described as constant and pressure-like.  The location of the pain is frontal, bilateral.  High-risk features (red flags) include fever, neck stiffness, vision loss (blurred), trauma ( minor trauma, he struck his forehead with his fist 08/16 trying to open a Thermos, no LOC), new type of headache, coumadin use (PT/INR was 3.65 @ [facility]  on 08/16) and concomitant infection (?Pyelo).  The patient denies the following high-risk features: focal weakness and altered mental status.  The headaches  have no trigger except the underling illness..No PMH of migraine.  Prior treatment has included a narcotic with  only partial response.  His fever responds to Tylenol.  Current Medications (verified): 1)  Lisinopril 20 Mg Tabs (Lisinopril) .... Take One Tablet By Mouth Daily 2)  Aspirin Adult Low Strength  81 Mg Tbec (Aspirin) .Marland Kitchen.. 1 By Mouth Once Daily 3)  Coumadin 10 Mg Tabs (Warfarin Sodium) .... Take As Directed By Coumadin Clinic. 4)  Simvastatin 40 Mg Tabs (Simvastatin) .... Take 1 Tablet By Mouth At Bedtime 5)  Viagra 100 Mg Tabs (Sildenafil Citrate) .... Take 1 Tab As Needed 6)  Percocet 7.5-500 Mg Tabs (Oxycodone-Acetaminophen) .Marland Kitchen.. 1 By Mouth Q4h Prn 7)  Bactrim Ds 800-160 Mg Tabs (Sulfamethoxazole-Trimethoprim) .Marland Kitchen.. 1 By Mouth Two Times A Day For 10 Days 8)  Zofran 8 Mg Tabs (Ondansetron Hcl) .Marland Kitchen.. 1 By Mouth Q 4-6h As Needed Nausea  Allergies: No Known Drug Allergies  Physical Exam  General:   Uncomfortable but in no acute distress; alert,appropriate and cooperative throughout examination Eyes:  No corneal or conjunctival inflammation noted. EOMI. Perrla. Field of  Vision grossly normal. Nose:  External nasal examination shows no deformity or inflammation. Nasal mucosa are pink and moist without lesions or exudates. Mouth:  Oral mucosa and oropharynx without lesions or exudates.  Teeth in good repair. Tongue moist  ; no tongue deviation Neck:  No deformities, masses, or tenderness noted. Supple Lungs:  Normal respiratory effort, chest expands symmetrically. Lungs are clear to auscultation, no crackles or wheezes. Heart:  grade 1.5 /6 systolic murmur.   Mechanical click Abdomen:  Bowel sounds positive,abdomen soft and non-tender without masses, organomegaly or hernias noted. Msk:  No flank tenderness Pulses:  R and L carotid,radial,dorsalis pedis and posterior tibial pulses are full and equal bilaterally Extremities:  No clubbing,  cyanosis, edema. Bursa L knee. Neg SLR bilaterally Neurologic:  affect subdued but  oriented X3, cranial nerves II-XII intact, strength normal in all extremities, sensation intact to light touch, gait  slow but normal, DTRs symmetrical and normal, finger-to-nose normal, and Romberg negative.   Skin:  Intact without suspicious lesions or rashes Cervical  Nodes:  No lymphadenopathy noted Axillary Nodes:  No palpable lymphadenopathy Psych:  memory intact for recent and remote and subdued.     Impression & Recommendations:  Problem # 1:  HEADACHE (ICD-784.0)  His updated medication list for this problem includes:    Aspirin Adult Low Strength 81 Mg Tbec (Aspirin) .Marland Kitchen... 1 by mouth once daily    Percocet 7.5-500 Mg Tabs (Oxycodone-acetaminophen) .Marland Kitchen... 1 by mouth q4h prn  Problem # 2:  VOMITING (ICD-787.03) N&V X 1  Problem # 3:  PYELONEPHRITIS, ACUTE (ICD-590.10) C&S pending  Complete Medication List: 1)  Lisinopril 20 Mg Tabs (Lisinopril) .... Take one tablet by mouth daily 2)  Aspirin Adult Low Strength 81 Mg Tbec (Aspirin) .Marland Kitchen.. 1 by mouth once daily 3)  Coumadin 10 Mg Tabs (Warfarin sodium) .... Take as directed by coumadin clinic. 4)  Simvastatin 40 Mg Tabs (Simvastatin) .... Take 1 tablet by mouth at bedtime 5)  Viagra 100 Mg Tabs (Sildenafil citrate) .... Take 1 tab as needed 6)  Percocet 7.5-500 Mg Tabs (Oxycodone-acetaminophen) .Marland Kitchen.. 1 by mouth q4h prn 7)  Bactrim Ds 800-160 Mg Tabs (Sulfamethoxazole-trimethoprim) .Marland Kitchen.. 1 by mouth two times a day for 10 days 8)  Zofran 8 Mg Tabs (Ondansetron hcl) .Marland Kitchen.. 1 by mouth q 4-6h as needed nausea 9)  Promethazine Hcl 25 Mg Supp (Promethazine hcl) .Marland Kitchen.. 1 by rectum every 6 -8 hrs as needed  for n&v  Patient Instructions: 1)  Clear Liquids until nausea resolves .I shall call Lab 08/20 for final results & contact you . Prescriptions: PROMETHAZINE HCL 25 MG SUPP (PROMETHAZINE HCL) 1 by rectum every 6 -8 hrs as needed  for N&V  #5 x 0   Entered and Authorized by:   Marga Melnick MD   Signed by:   Marga Melnick MD on 04/18/2010   Method used:   Print then Give to Patient   RxID:   [id number]

## 2010-10-02 NOTE — Medication Information (Signed)
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 no    Recent/future dental: no  Any missed doses?: no       Is patient compliant with meds? yes       Allergies: No Known Drug Allergies  Anticoagulation Management History:      The patient is taking warfarin and comes in today for a routine follow up visit.  Negative risk factors for bleeding include an age less than 62 years old.  The bleeding index is 'low risk'.  Negative CHADS2 values include Age > 67 years old.  The start date was 05/15/1982.  His last INR was 2.1 ratio.  Anticoagulation responsible provider: Deshante Cassell MD, Reuel Boom.  INR POC: 3.2.  Exp: 07/2011.    Anticoagulation Management Assessment/Plan:      The patient's current anticoagulation dose is Coumadin 10 mg tabs: Take as directed by coumadin clinic..  The target INR is 3.5-4.5.  The next INR is due 07/04/2010.  Anticoagulation instructions were given to patient.  Results were reviewed/authorized by Cloyde Reams, RN, BSN.  He was notified by Cloyde Reams RN.         Prior Anticoagulation Instructions: INR 2.2 Take 20mg s each day til Thursday and resume Lovenox twice aday 12 hrs apart.   Current Anticoagulation Instructions: INR 3.2  Take 20mg  today, then resume same dosage 15mg  daily except 10mg  on Thursdays.  Recheck in 2 weeks.

## 2010-10-02 NOTE — Progress Notes (Signed)
Summary: nos appt  Phone Note Call from Patient   Caller: [email or web address]  Call For: Haynes Giannotti Summary of Call: LMTCB x2 to rsc nos from 10/31. Initial call taken by: Darletta Moll,  July 01, 2010 3:38 PM

## 2010-10-02 NOTE — Progress Notes (Signed)
Summary: Pt daughter request call  Phone Note Call from Patient Call back at Home Phone [phone]   Caller: Daughter/*alexis Summary of Call: Pt daughter request call due to pt being in hospital Initial call taken by: Judie Grieve,  May 13, 2010 11:38 AM  Follow-up for Phone Call        spoke w/daughter, pt called 9-1-1 and went to [facility] ER for rapid irreg. HR per daughter they just took him back to do a dccv, he wanted Dr Gala Romney to know that he was there, mess sent to Dr Warnell Forester, RN  May 13, 2010 3:41 PM

## 2010-10-02 NOTE — Assessment & Plan Note (Signed)
Summary: appt @ 10:15/rov/f/u from echo   Visit Type:  Follow-up Primary Provider:  none  CC:  Fatigue.  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  He is status post St. Jude aortic valve replacement in 1983.  He also has a history of hyperlipidemia.   Over the past few years year, he has had some progression in the gradients across his valve.  He underwent cardiac catheterization in May 2009 which showed normal coronary arteries.  We did not cross the valve, obviously this is a mechanical valve.  However, the valve leaflets were seen to be opening well on fluoroscopy.  He did undergo a TEE.  While there was some turbulence around the valve, the leaflets seem to be moving well.  There was no obvious pannus formation.  Gradient on his transthoracic echo was within the moderate range at 33. 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 He was seen by Dr. [name] who agreed  with continue watchul waiting.  Currently undergoing treatment for pyelonephritis in setting of E. Coli UTI.   Overall feels like he is slowing down. Says he used to be the life of the party. Now just wants to stay home. Has lost interested in sex. Denies depression. Snoring much improved with new mattress. Company closed down so under a lot of stress. Still doing odd jobs. Plays softball and get SOB when running the bases. No syncope, CP or edema. Not exercising regularly.   Has not had echo since 11/09 due to lack of insurance.   Current Medications (verified): 1)  Lisinopril 20 Mg Tabs (Lisinopril) .... Take One Tablet By Mouth Daily 2)  Aspirin Adult Low Strength 81 Mg Tbec (Aspirin) .Marland Kitchen.. 1 By Mouth Once Daily 3)  Coumadin 10 Mg Tabs (Warfarin Sodium) .... Take As Directed By Coumadin Clinic. 4)  Simvastatin 40 Mg Tabs (Simvastatin) .... Take 1 Tablet By Mouth At Bedtime 5)  Viagra 100 Mg Tabs (Sildenafil  Citrate) .... Take 1 Tab As Needed 6)  Bactrim Ds 800-160 Mg Tabs (Sulfamethoxazole-Trimethoprim) .Marland Kitchen.. 1 By Mouth Two Times A Day For 10 Days 7)  Promethazine Hcl 25 Mg Supp (Promethazine Hcl) .Marland Kitchen.. 1 By Rectum Every 6 -8 Hrs As Needed  For N&v  Allergies (verified): No Known Drug Allergies  Past History:  Past Medical History: Last updated: 08/06/2009 AoV SBE 1983   --s/p AoV replacement with St. 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   Vital Signs:  Patient profile:   50 year old male Height:      70.5 inches Weight:      217 pounds BMI:     30.81 Pulse rate:   63 / minute BP sitting:   126 / 88  (left arm) Cuff size:   regular  Vitals Entered By: Hardin Negus, RMA (April 23, 2010 3:00 PM)  Physical Exam  General:  Well appearing. no resp difficulty HEENT: normal Neck: supple. no JVD. Carotids 2+ bilat; +bilat radiated bruits.  Cor: PMI nondisplaced. Regular rate & rhythm. No rubs, gallops, mechanical s2. very crisp. 2/6 SEM at RSB Lungs: clear Abdomen: Obese.NT. nondistended.Peri Jefferson bowel sounds. Extremities: no cyanosis, clubbing, rash, edema. Large left knee effusion Neuro: alert & orientedx3, cranial  nerves grossly intact. moves all 4 extremities w/o difficulty. affect pleasant   Impression & Recommendations:  Problem # 1:  AORTIC VALVE REPLACEMENT, HX OF (ICD-V43.3) Despite gradient on previous echo valve continues to sound crisp. Overdue for echo due to insurance pruposes. We have helped him apply to [facility] for financial assistance with this so we can re-evaluate valve and aortic root size. I encouraged him to do more cardio exercise.   Other Orders: EKG w/ Interpretation (93000)  Patient Instructions: 1)  Your physician has requested that you have an echocardiogram.  Echocardiography is a painless test that uses  sound waves to create images of your heart. It provides your doctor with information about the size and shape of your heart and how well your heart's chambers and valves are working.  This procedure takes approximately one hour. There are no restrictions for this procedure. 2)  Your physician wants you to follow-up in:  1 year.  You will receive a reminder letter in the mail two months in advance. If you don't receive a letter, please call our office to schedule the follow-up appointment.

## 2010-10-02 NOTE — Progress Notes (Signed)
Summary: nos appt  Phone Note Call from Patient   Caller: [email or web address]  Call For: Rejina Odle Summary of Call: Rsc nos from 10/13 to 10/31. Initial call taken by: Darletta Moll,  June 13, 2010 10:23 AM

## 2010-10-02 NOTE — Progress Notes (Signed)
  Phone Note Call from Patient   Caller: Patient Call For: [facility] Clinic Summary of Call: Patient called to notify coumadin clinic that he is going to have a tooth extraction done today.  He did not expect to have this done, but once the dentist examined him, he decided it was best to pull the tooth.  Patient wanted to ensure this would be ok, while still on coumadin.  I have informed him that we generally do not hold coumadin for minor tooth extractions and for him to proceed.  He will return for f/u at his regularly schedule time.   Initial call taken by: Eda Keys,  July 18, 2010 8:34 AM

## 2010-10-02 NOTE — Letter (Signed)
Summary: [facility]  [facility]   Imported By: Lanelle Bal 08/04/2010 13:10:25  _____________________________________________________________________  External Attachment:    Type:   Image     Comment:   External Document

## 2010-10-02 NOTE — Progress Notes (Signed)
Summary: fyi call a nurse  Phone Note Outgoing Call   Details for Reason: Call-A-Nurse Triage Call Report Triage Record Num: [id number] Operator: Alphonsa Overall Patient Name: Mark [name] Call Date & Time: 04/17/2010 6:13:53PM Patient Phone: [phone] PCP: Marga Melnick Patient Gender: Male PCP Fax : [phone] Patient DOB: [birth year] Practice Name: [facility] Reason for Call: Sherry/spouse calling about drug interaction with coumadin. ED 04/16/10 dx kidney infection.IV abx and fluids. Macrobid prescribed pt not taking due to possible rxn? Fever 101.6 at 1515. Tylenol helping fever. Flank pain unbearable, Percocet and phenergan not helping with pain/nausea. Home care advice given. [facility] 04/17/10 for evaluation. Protocol(s) Used: Flank Pain Recommended Outcome per Protocol: See ED Immediately Reason for Outcome: Unbearable abdominal/pelvic pain Care Advice:  ~ Allow the patient to be in a position of comfort.  ~ Another adult should drive.  ~ Do not give the patient anything to eat or drink.  ~ Do not push on abdomen. Call EMS 911 if signs and symptoms of shock develop (such as unable to stand due to faintness, dizziness, or lightheadedness; new onset of confusion; slow to respond or difficult to awaken; skin is pale, gray, cool, or moist to touch; severe weakness; loss of consciousness).  ~  ~ IMMEDIATE ACTION Write down provider's name. List or place the following in a bag for transport with the patient: current prescription and/or nonprescription medications; alternative treatments, therapies and medications; and street drugs.  Follow-up for Phone Call        Closed phone note b/c patient is being seen in office now. Follow-up by: Lucious Groves CMA,  April 18, 2010 2:41 PM

## 2010-10-02 NOTE — Assessment & Plan Note (Signed)
Summary: swollen right calf/kdc   Vital Signs:  Patient profile:   50 year old male Weight:      212 pounds BMI:     30.10 Temp:     98.3 degrees F oral Pulse rate:   72 / minute Resp:     16 per minute BP sitting:   108 / 66  (left arm) Cuff size:   large  Vitals Entered By: Shonna Chock (March 14, 2009 12:29 PM) CC: RIGHT LEG CONCERNS-PATIENT HIT LEG AND NOW ITS SWOLLEN AND VERY PAINFUL. PATIENT WENT TO EMERGENCY ROOM LAST PM, TOLD TO F/U WITH PRIMARY. Comments REVIEWED MED LIST, PATIENT AGREED DOSE AND INSTRUCTION CORRECT    CC:  RIGHT LEG CONCERNS-PATIENT HIT LEG AND NOW ITS SWOLLEN AND VERY PAINFUL. PATIENT WENT TO EMERGENCY ROOM LAST PM and TOLD TO F/U WITH PRIMARY.Marland Kitchen  History of Present Illness: Onset 03/13/2009 as swelling & pain R calf; hit leg 7/12 on trailer. Initially no pain but bruising & swelling that night. Pain as of 7/14 after walking. R Calf was 19inches  , L 16. No C-P symptoms. ER 03/13/2009: PT/INR 4.8  Corrie Dandy wants it 4.5"); creat 0.96. Dr Barry Dienes did ? Doppler in ER ;"concern was lot of bleeding" . Calf has decreased. Coumadin held last evening  Allergies (verified): No Known Drug Allergies  Physical Exam  General:  well-nourished,in no acute distress; alert,appropriate and cooperative throughout examination Lungs:  Normal respiratory effort, chest expands symmetrically. Lungs are clear to auscultation, no crackles or wheezes. Heart:  Mechanical click Pulses:  R and L dorsalis pedis and posterior tibial pulses are full and equal bilaterally Extremities:  ? + Homan's RLE. R calf 42 cm & L calf 40.5 cm 17 cm below inf patella. 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 Take one tablet by mouth daily 2)  Aspirin Adult Low  Strength 81 Mg Tbec (Aspirin) .Marland Kitchen.. 1 by mouth once daily 3)  Pravastatin Sodium 20 Mg Tabs (Pravastatin sodium) .... Take one tablet by mouth daily at bedtime 4)  Coumadin 10 Mg Tabs (Warfarin sodium) .... Sunday - 1.5 tabs, monday - 1.5 tabs, tuesday - 1.5 tabs, wednesday - 1.5 tabs, thursday - 1.5 tabs, friday - 1.5 tabs, saturday - 1.5 tabs 5)  Simvastatin 40 Mg Tabs (Simvastatin) .... Take 1 tablet by mouth at bedtime  Patient Instructions: 1)  Warm moist compresses  & elevation. Take pain meds as needed as Rxed. CT scan of RLE if swelling increases. No change in present coumadin dose; PT/INR schedule as per Dr Shelby Dubin.   ANTICOAGULATION RECORD PREVIOUS REGIMEN & LAB RESULTS Anticoagulation Diagnosis:  Aortic Valve Disorder (ICD-424.1) on  01/11/2009 Previous INR Goal Range:  3.5 - 4.5 on  01/11/2009 Previous INR:  6.9 ratio on  12/03/2008 Previous Coumadin Dose(mg):  10mg  on  03/14/2009   NEW REGIMEN & LAB RESULTS Current INR: 3.0 Regimen:   (no change)  MEDICATIONS LISINOPRIL 20 MG TABS (LISINOPRIL) Take one tablet by mouth daily ASPIRIN ADULT LOW STRENGTH 81 MG TBEC (ASPIRIN) 1 by mouth once daily PRAVASTATIN SODIUM 20 MG TABS (PRAVASTATIN SODIUM) Take one tablet by mouth daily at bedtime COUMADIN 10 MG TABS (WARFARIN SODIUM) Sunday - 1.5 tabs, Monday - 1.5 tabs, Tuesday - 1.5  tabs, Wednesday - 1.5 tabs, Thursday - 1.5 tabs, Friday - 1.5 tabs, Saturday - 1.5 tabs SIMVASTATIN 40 MG TABS (SIMVASTATIN) Take 1 tablet by mouth at bedtime

## 2010-10-02 NOTE — Medication Information (Signed)
Summary: rov/sp  Anticoagulant Therapy  Managed by: Bethena Midget, RN, BSN Referring MD: Shirlee Limerick PCP: none Supervising MD: Patty Sermons MD Indication 1: Aortic Valve Disorder (ICD-424.1) Indication 2: Aortic Valve Replacement (ICD-V43.3) Lab Used: [facility] [place] Site: [address] INR POC 2.2 INR RANGE 3.5 - 4.5  Dietary changes: no    Health status changes: no    Bleeding/hemorrhagic complications: no    Recent/future hospitalizations: no    Any changes in medication regimen? no    Recent/future dental: no  Any missed doses?: no       Is patient compliant with meds? yes      Comments: Restarted coumadin Wednesday 12th after Left shoulder arteriogram. Was off coumadin from 6th to 12th, bridged with Lovenox but pt took last Lovenox Sunday AM, states he thought he was suppose to stop then. Educated/informed him that he was to continue until he came in for this appt.   Allergies: No Known Drug Allergies  Anticoagulation Management History:      The patient is taking warfarin and comes in today for a routine follow up visit.  Negative risk factors for bleeding include an age less than 64 years old.  The bleeding index is 'low risk'.  Negative CHADS2 values include Age > 51 years old.  The start date was 05/15/1982.  His last INR was 2.1 ratio.  Anticoagulation responsible provider: Renesha Lizama MD.  INR POC: 2.2.  Cuvette Lot#: [id number].  Exp: 07/2011.    Anticoagulation Management Assessment/Plan:      The patient's current anticoagulation dose is Coumadin 10 mg tabs: Take as directed by coumadin clinic..  The target INR is 3.5-4.5.  The next INR is due 06/19/2010.  Anticoagulation instructions were given to patient.  Results were reviewed/authorized by Bethena Midget, RN, BSN.  He was notified by Bethena Midget, RN, BSN.         Prior Anticoagulation Instructions: INR 1.1  Continue previous directions for Lovenox bridging and restarting Coumadin.   Current  Anticoagulation Instructions: INR 2.2 Take 20mg s each day til Thursday and resume Lovenox twice aday 12 hrs apart.

## 2010-10-02 NOTE — Medication Information (Signed)
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 no    Recent/future dental: no  Any missed doses?: no       Is patient compliant with meds? yes       Allergies: No Known Drug Allergies  Anticoagulation Management History:      The patient is taking warfarin and comes in today for a routine follow up visit.  Negative risk factors for bleeding include an age less than 52 years old.  The bleeding index is 'low risk'.  Negative CHADS2 values include Age > 77 years old.  The start date was 05/15/1982.  His last INR was 3.0.  Anticoagulation responsible provider: Eden Emms MD, Theron Arista.  INR POC: 3.3.  Cuvette Lot#: [id number].  Exp: 12/2010.    Anticoagulation Management Assessment/Plan:      The patient's current anticoagulation dose is Coumadin 10 mg tabs: Take as directed by coumadin clinic..  The target INR is 3.5-4.5.  The next INR is due 12/27/2009.  Anticoagulation instructions were given to patient.  Results were reviewed/authorized by Bethena Midget, RN, BSN.  He was notified by Bethena Midget, RN, BSN.         Prior Anticoagulation Instructions: INR 3.4  Take 2 tablets today then resume same dosage 1.5 tablets daily except 1 tablet on Thursdays.  Recheck in 4 weeks.    Current Anticoagulation Instructions: INR 3.3 Today 20mg s then resume 15mg s daily except 10mg s on Thursdays. Recheck in 4 weeks.

## 2010-10-02 NOTE — Medication Information (Signed)
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 no    Recent/future dental: no  Any missed doses?: no       Is patient compliant with meds? yes       Allergies: No Known Drug Allergies  Anticoagulation Management History:      The patient is taking warfarin and comes in today for a routine follow up visit.  Negative risk factors for bleeding include an age less than 16 years old.  The bleeding index is 'low risk'.  Negative CHADS2 values include Age > 10 years old.  The start date was 05/15/1982.  His last INR was 3.0.  Anticoagulation responsible provider: Myrtis Ser MD, Tinnie Gens.  INR POC: 4.4.  Cuvette Lot#: [id number].  Exp: 12/2010.    Anticoagulation Management Assessment/Plan:      The patient's current anticoagulation dose is Coumadin 10 mg tabs: Take as directed by coumadin clinic..  The target INR is 3.5-4.5.  The next INR is due 03/25/2010.  Anticoagulation instructions were given to patient.  Results were reviewed/authorized by Elaina Pattee, PharmD.  He was notified by Elaina Pattee, PharmD.         Prior Anticoagulation Instructions: INR 3.3 Today 20mg s then resume 15mg s daily except 10mg s on Thursdays. Recheck in 4 weeks.   Current Anticoagulation Instructions: INR 4.4. Take 1.5 tablets daily except 1 tablet on Thursdays. Recheck in 4 weeks.

## 2010-10-02 NOTE — Letter (Signed)
Summary: [facility]  [facility]   Imported By: Freddy Jaksch 01/16/2008 13:20:36  _____________________________________________________________________  External Attachment:    Type:   Image     Comment:   External Document  Appended Document: [facility] Dr Arnold Long & Dr Almon Hercules : ascending aortic aneurysm ,ongoing monitor

## 2010-10-02 NOTE — Progress Notes (Signed)
Summary: pt needs rx for dentist  Phone Note Call from Patient Call back at Home Phone [phone]   Caller: Patient Reason for Call: Talk to Nurse Summary of Call: pt is going to the dentist Jul 18, 2010. pt states needs meds called in for the dentist visit. pharmacy# [phone] Initial call taken by: Roe Coombs,  July 10, 2010 3:50 PM  Follow-up for Phone Call          Called in Amoxicillin to pt's pharmacy b/c of his history of valve replacement & endocarditis. Pt. aware to take antbx. 1 hour before his dental procedure. Whitney Maeola Sarah RN  July 10, 2010 4:24 PM  Follow-up by: Whitney Maeola Sarah RN,  July 10, 2010 4:24 PM    New/Updated Medications: AMOXICILLIN 500 MG CAPS (AMOXICILLIN) Please take four tablets by mouth 1 hour before your procedure. Prescriptions: AMOXICILLIN 500 MG CAPS (AMOXICILLIN) Please take four tablets by mouth 1 hour before your procedure.  #8 x 0   Entered by:   Ellender Hose RN   Authorized by:   Dolores Patty, MD, [facility]   Signed by:   Ellender Hose RN on 07/10/2010   Method used:   Electronically to        [facility]* (retail)       [address]       [place], [place]  [address]       Ph: [phone]       Fax: [phone]   RxID:   [id number]

## 2010-10-02 NOTE — Medication Information (Signed)
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 no    Recent/future dental: no  Any missed doses?: no       Is patient compliant with meds? yes       Current Medications (verified): 1)  Lisinopril 20 Mg Tabs (Lisinopril) .... Take One Tablet By Mouth Daily 2)  Aspirin Adult Low Strength 81 Mg Tbec (Aspirin) .Marland Kitchen.. 1 By Mouth Once Daily 3)  Pravastatin Sodium 20 Mg Tabs (Pravastatin Sodium) .... Take One Tablet By Mouth Daily At Bedtime 4)  Coumadin 10 Mg Tabs (Warfarin Sodium) .... Sunday - 1.5 Tabs, Monday - 1.5 Tabs, Tuesday - 1.5 Tabs, Wednesday - 1.5 Tabs, Thursday - 1.5 Tabs, Friday - 1.5 Tabs, Saturday - 1.5 Tabs 5)  Simvastatin 40 Mg Tabs (Simvastatin) .... Take 1 Tablet By Mouth At Bedtime  Allergies (verified): No Known Drug Allergies  Anticoagulation Management History:      The patient is taking warfarin and comes in today for a routine follow up visit.  Negative risk factors for bleeding include an age less than 31 years old.  The bleeding index is 'low risk'.  Negative CHADS2 values include Age > 28 years old.  The start date was 05/15/1982.  His last INR was 3.0.  Anticoagulation responsible provider: Eden Emms MD, Theron Arista.  INR POC: 4.1.  Cuvette Lot#: [id number].  Exp: 05/2010.    Anticoagulation Management Assessment/Plan:      The patient's current anticoagulation dose is Coumadin 10 mg tabs: Sunday - 1.5 tabs, Monday - 1.5 tabs, Tuesday - 1.5 tabs, Wednesday - 1.5 tabs, Thursday - 1.5 tabs, Friday - 1.5 tabs, Saturday -  1.5 tabs.  The target INR is 3.5-4.5.  The next INR is due 05/17/2009.  Anticoagulation instructions were given to patient.  Results were reviewed/authorized by Shelby Dubin, PharmD, BCPS, CPP.         Prior Anticoagulation Instructions: pt. states his dosage is 15mg s daily except 10mg s on Thursdays. Per ER instructions pt. held coumadin yesterday, he's aware to resume regular dose and 2wks follow appt. was scheduled.   Current Anticoagulation Instructions: INR 4.1  Continue on the same Dose: 1.5 tablet every day except 1 tablet on Thursdays.  Recheck in 4 weeks

## 2010-10-02 NOTE — Medication Information (Signed)
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 no    Recent/future dental: no  Any missed doses?: no       Is patient compliant with meds? yes       Current Medications (verified): 1)  Lisinopril 20 Mg Tabs (Lisinopril) .... Take One Tablet By Mouth Daily 2)  Aspirin Adult Low Strength 81 Mg Tbec (Aspirin) .Marland Kitchen.. 1 By Mouth Once Daily 3)  Pravastatin Sodium 20 Mg Tabs (Pravastatin Sodium) .... Take One Tablet By Mouth Daily At Bedtime 4)  Coumadin 10 Mg Tabs (Warfarin Sodium) .... Take As Directed By Coumadin Clinic. 5)  Simvastatin 40 Mg Tabs (Simvastatin) .... Take 1 Tablet By Mouth At Bedtime  Allergies (verified): No Known Drug Allergies  Anticoagulation Management History:      The patient is taking warfarin and comes in today for a routine follow up visit.  Negative risk factors for bleeding include an age less than 29 years old.  The bleeding index is 'low risk'.  Negative CHADS2 values include Age > 54 years old.  The start date was 05/15/1982.  His last INR was 3.0.  Anticoagulation responsible provider: Myrtis Ser MD, Tinnie Gens.  INR POC: 4.7.  Cuvette Lot#: [id number].  Exp: 07/2010.    Anticoagulation Management Assessment/Plan:      The patient's current anticoagulation dose is Coumadin 10 mg tabs: Take as directed by coumadin clinic..  The target INR is 3.5-4.5.  The next INR is due 06/14/2009.  Anticoagulation instructions were given to patient.  Results were reviewed/authorized by Cloyde Reams, RN, BSN.  He was notified by Cloyde Reams RN.         Prior Anticoagulation Instructions: INR 4.1  Continue on the same Dose: 1.5 tablet every day except 1 tablet on Thursdays.  Recheck in 4 weeks  Current Anticoagulation Instructions: INR 4.7  Skip today's dose of coumadin.  Then resume 15mg  daily except 10mg  on Thursdays.  Recheck in 4 weeks.

## 2010-10-02 NOTE — Medication Information (Signed)
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 no    Recent/future dental: no  Any missed doses?: yes     Details: Missed Saturday's dose  Is patient compliant with meds? yes       Allergies: No Known Drug Allergies  Anticoagulation Management History:      The patient is taking warfarin and comes in today for a routine follow up visit.  Negative risk factors for bleeding include an age less than 72 years old.  The bleeding index is 'low risk'.  Negative CHADS2 values include Age > 15 years old.  The start date was 05/15/1982.  His last INR was 3.0.  Anticoagulation responsible provider: Traevion Poehler MD, Reuel Boom.  INR POC: 3.6.  Cuvette Lot#: [id number].  Exp: 06/2011.    Anticoagulation Management Assessment/Plan:      The patient's current anticoagulation dose is Coumadin 10 mg tabs: Take as directed by coumadin clinic..  The target INR is 3.5-4.5.  The next INR is due 05/13/2010.  Anticoagulation instructions were given to patient.  Results were reviewed/authorized by Bethena Midget, RN, BSN.  He was notified by Bethena Midget, RN, BSN.         Prior Anticoagulation Instructions: INR 4.4. Take 1.5 tablets daily except 1 tablet on Thursdays. Recheck in 4 weeks.  Current Anticoagulation Instructions: INR 3.6 Continue 15mg s daily except 10mg s on Thursdays. Recheck in 4 weeks.

## 2010-10-02 NOTE — Progress Notes (Signed)
Summary: Lovenox Instructions  Phone Note Call from Patient   Caller: Patient Summary of Call: Patient came by clinic to let us know his arthrogram has been rescheduled for 10/10.  He was given a copy of the following instructions:  10/5- Last dose of Coumadin 10/6- No Coumadin or Lovenox 10/7- Start Lovenox 100mg  subcutaneously two times a day  10/8-10/9- Continue Lovenox 10/10- Procedure- Take Coumadin 20mg  after procedure 10/11- Restart Lovenox and Coumadin 20mg   10/12-10/16- Continue Lovenox and Coumadin at normal dose 10/17- Appt with Coumadin Clinic.  Pt was given Lovenox that was received from the company.  Initial call taken by: Weston Brass PharmD,  June 04, 2010

## 2010-10-02 NOTE — Progress Notes (Signed)
Summary: monitor results  Phone Note Outgoing Call   Call placed by: Meredith Staggers, RN,  May 27, 2010 3:11 PM Call placed to: Patient Summary of Call: called pt w/monitor results, NSR w/PVCs does not need tikosyn, he will let us know if he has future episodes

## 2010-10-02 NOTE — Letter (Signed)
Summary: [facility]  [facility]oumadin  [address]   [place], [place] [address]   Phone: [phone]  Fax: [phone]     June 19, 2009 MRN: [id number]   Mark [name] [address] [place], [place]  [address]   Dear Mr. [name],  This letter is being sent to you as a reminder that it is necessary for you to get your INR/PT checked regularly so that we can optimize your care.  Our records indicate that you were scheduled to have a test done recently.  As of today, we have not received the results of this test.  It is very important that you have your INR checked.  Please call our office at the number listed above to schedule an appointment at your earliest convenience.    If you have recently had your protime checked or have discontinued this medication, please contact our office at the above phone number to clarify this issue.  Thank you for this prompt attention to this important health care matter.  Sincerely,   [facility] Team    Appended Document: [facility] Coumadin 1 Attempted to call pt and reschedule missed appt home and work number, no longer in service.   EWJ

## 2010-10-02 NOTE — Progress Notes (Signed)
Summary: meds for dental work  Phone Note Call from Patient Call back at [facility] [phone]   Caller: Patient Reason for Call: Talk to Nurse Summary of Call: pt has to having dental work done pt states he needs Antibiotics  before going to dentist office. Pharmacy # [phone]  Initial call taken by: Roe Coombs,  September 19, 2010 9:52 AM  Follow-up for Phone Call        pt aware rx sent in [facility], RN  September 19, 2010 10:03 AM     New/Updated Medications: AMOXICILLIN 500 MG CAPS (AMOXICILLIN) Please take four tablets by mouth 1 hour before your procedure. Prescriptions: AMOXICILLIN 500 MG CAPS (AMOXICILLIN) Please take four tablets by mouth 1 hour before your procedure.  #4 x 6   Entered by:   Meredith Staggers, RN   Authorized by:   Dolores Patty, MD, [facility]   Signed by:   Meredith Staggers, RN on 09/19/2010   Method used:   Electronically to        [facility]* (retail)       [address]       [place], [place]  [address]       Ph: [phone]       Fax: [phone]   RxID:   [id number]
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
## 2010-10-02 NOTE — Progress Notes (Signed)
Summary: RETURNING CALLED BACK  Medications Added SIMVASTATIN 40 MG TABS (SIMVASTATIN) Take 1 tablet by mouth at bedtime       Phone Note Call from Patient Call back at Home Phone [phone] Call back at [phone]   Caller: Patient Reason for Call: Talk to Nurse Summary of Call: SOMEONE CALLED HIM TODAY, JUST RETURNING CALLED BACK Initial call taken by: Lorne Skeens,  March 12, 2009 1:48 PM  Follow-up for Phone Call        I did not initiate phone call to pt. in call backs for Dr.Bensimhon...will send to [facility]. Follow-up by: J REISS RN    New/Updated Medications: SIMVASTATIN 40 MG TABS (SIMVASTATIN) Take 1 tablet by mouth at bedtime

## 2010-10-02 NOTE — Medication Information (Signed)
Summary: ROV.MP  Anticoagulant Therapy  Managed by: Mina Marble PharmD Supervising MD: Dietrich Pates MD  PT 18.5  Dietary changes: no    Health status changes: no    Bleeding/hemorrhagic complications: no    Recent/future hospitalizations: no    Any changes in medication regimen? no    Recent/future dental: no  Any missed doses?: no       Is patient compliant with meds? yes       Current Medications (verified): 1)  Coumadin (? Dose) 2)  Lisinopril 20 Mg Tabs (Lisinopril) .... Take One Tablet By Mouth Daily 3)  Aspirin Adult Low Strength 81 Mg Tbec (Aspirin) .Marland Kitchen.. 1 By Mouth Once Daily 4)  Pravastatin Sodium 20 Mg Tabs (Pravastatin Sodium) .... Take One Tablet By Mouth Daily At Bedtime  Allergies (verified): No Known Drug Allergies  Anticoagulation Management History:      Negative risk factors for bleeding include an age less than 27 years old.  The bleeding index is 'low risk'.  Negative CHADS2 values include Age > 47 years old.  His last INR was 6.9 ratio.    Anticoagulation Management Assessment/Plan:      The patient's current anticoagulation dose is Coumadin 10 mg tabs: Sunday - 1.5 tabs, Monday - 1.5 tabs, Tuesday - 1.5 tabs, Wednesday - 1.5 tabs, Thursday - 1.5 tabs, Friday - 1.5 tabs, Saturday - 1.5 tabs.  The target INR is 3.5-4.5.  Anticoagulation instructions were given to patient.  Results were reviewed/authorized by Mina Marble PharmD.  He was notified by Mina Marble, Pharm.D..         Current Anticoagulation Instructions: 20 mg x 2 days, then 15 mg daily

## 2010-10-02 NOTE — Assessment & Plan Note (Signed)
Summary: eph/ rapid irr heartbeats/mt   Visit Type:  Follow-up Primary Provider:  none   History of Present Illness: Mark [name] is a very pleasant 50 year old male with a history of bicuspid aortic valve complicated by endocarditis.  He is status post St. Jude aortic valve replacement in 1983.  He also has a history of hyperlipidemia.   Over the past few years year, he has had some progression in the gradients across his valve.  He underwent cardiac catheterization in May 2009 which showed normal coronary arteries.  We did not cross the valve, obviously this is a mechanical valve.  However, the valve leaflets were seen to be opening well on fluoroscopy.  He did undergo a TEE.  While there was some turbulence around the valve, the leaflets seem to be moving well.  There was no obvious pannus formation.  Gradient on his transthoracic echo was within the moderate range at 33. 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 He was seen by Dr. [name] who agreed  with continue watchul waiting.  Recently admitted with palpitations and found to have new-onset AF. Underwent DC-CV by Dr. Ladona Ridgel. Says he continues to have brief palpitations almost every day and night but nothing sustained.  Recently tore rotator cuff and still having pain with his arm.   Current Medications (verified): 1)  Lisinopril 20 Mg Tabs (Lisinopril) .... Take One Tablet By Mouth Daily 2)  Aspirin Adult Low Strength 81 Mg Tbec (Aspirin) .Marland Kitchen.. 1 By Mouth Once Daily 3)  Coumadin 10 Mg Tabs (Warfarin Sodium) .... Take As Directed By [facility]madin Clinic. 4)  Simvastatin 40 Mg Tabs (Simvastatin) .... Take 1 Tablet By Mouth At Bedtime 5)  Viagra 100 Mg Tabs (Sildenafil Citrate) .... Take 1 Tab As Needed 6)  Promethazine Hcl 25 Mg Supp (Promethazine Hcl) .Marland Kitchen.. 1 By Rectum Every 6 -8 Hrs As Needed  For N&v  Allergies (verified): No Known Drug Allergies  Past History:  Past Medical History: Last updated: 08/06/2009 AoV SBE 1983  --s/p AoV replacement with St. 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  Vital Signs:  Patient profile:   50 year old male Height:      70.5 inches Weight:      215 pounds BMI:     30.52 Pulse rate:   75 / minute BP sitting:   104 / 60  (left arm)  Vitals Entered By: Laurance Flatten CMA (May 21, 2010 10:47 AM)  Physical Exam  General:  Well appearing. no resp difficulty HEENT: normal Neck: supple. no JVD. Carotids 2+ bilat; +bilat radiated bruits.  Cor: PMI nondisplaced. Regular rate & rhythm. No rubs, gallops, mechanical s2. very crisp. 2/6 SEM at RSB Lungs: clear Abdomen: Obese.NT. nondistended.Peri Jefferson bowel sounds. Extremities: no cyanosis, clubbing, rash, edema. Large ecchymosis left upper arm with limited ROM Neuro: alert & orientedx3, cranial nerves grossly intact. moves all 4 extremities w/o difficulty. affect pleasant    Impression & Recommendations:  Problem # 1:  ATRIAL FIBRILLATION (ICD-427.31) Remains in SR. By history it seems like he is having multiple brief episodes of AF during the day which are symptomatic. Will place Holter monitor if this confirms AF will admit for Tikosyn load. Will need treatment of his OSA to minimize risks of recurrence. Will refer back to pulmonary to  work on his mask.   Problem # 2:  AORTIC VALVE REPLACEMENT, HX OF (ICD-V43.3) Given AVR he will need lovenox bridge for shouldersurgery.    Other Orders: Holter (Holter) Pulmonary Referral (Pulmonary)  Patient Instructions: 1)  Your physician has recommended that you wear a holter monitor.  Holter monitors are medical devices that record the heart's electrical activity. Doctors most often use these monitors to diagnose arrhythmias. Arrhythmias are problems with the speed or rhythm of the heartbeat. The monitor is a small, portable device. You  can wear one while you do your normal daily activities. This is usually used to diagnose what is causing palpitations/syncope (passing out). 2)  If your monitor shows more a-fib we will call and schedule to be admitted to the hospital next week for Tikosyn. 3)  Your physician has recommended that you start taking Tikosyn.  Initiation of this medication requires a 3-day hospital stay, during which the medication is administered and you will be monitored closely.  You should not stop this medication without physician assistance.  You must be seen in our office for labs/EKG at least twice a year. This medication is used for treating arrhythmias and requires close follow-up. 4)  You have been diagnosed with atrial fibrillation.  Atrial fibrillation is a condition in which one of the upper chambers of the heart has extra electrical cells causing it to beat very fast.  Please see the handout/brochure given to you today for further information. 5)  Follow up in 3 months 6)  You have been referred to Dr Craige Cotta

## 2010-10-08 ENCOUNTER — Encounter: Payer: Self-pay | Admitting: Cardiology

## 2010-10-08 ENCOUNTER — Encounter (INDEPENDENT_AMBULATORY_CARE_PROVIDER_SITE_OTHER): Payer: Self-pay

## 2010-10-08 DIAGNOSIS — I359 Nonrheumatic aortic valve disorder, unspecified: Secondary | ICD-10-CM

## 2010-10-08 DIAGNOSIS — Z7901 Long term (current) use of anticoagulants: Secondary | ICD-10-CM

## 2010-10-08 LAB — CONVERTED CEMR LAB: POC INR: 1.7

## 2010-10-09 DIAGNOSIS — I4891 Unspecified atrial fibrillation: Secondary | ICD-10-CM

## 2010-10-09 DIAGNOSIS — Z952 Presence of prosthetic heart valve: Secondary | ICD-10-CM

## 2010-10-15 NOTE — Assessment & Plan Note (Signed)
OFFICE VISIT  Mark [name], Mark [name] DOB:  May 25, 1961                                        July 28, 2010 CHART #:  [id number]  HISTORY OF PRESENT ILLNESS:  The patient returns for an annual followup and surveillance of his thoracic aortic aneurysm.  He was last seen here in the office on July 15, 2009.  Since then, he has done well.  He never did have his gallbladder removed as after he was initially referred to one of the local general surgeons, he was told that his insurance would not pay for them as a provider and he would have to go out of network.  Rather than pursue this, the patient simply dropped it and never sought any further consultation.  He also notes that he has not been having postprandial indigestion like he had been before and overall he states that he is feeling well.  He has not had any chest pain.  He did develop new onset persistent atrial fibrillation in September of this year, for which he ultimately underwent DC cardioversion by Dr. Lewayne Bunting.  He has been remaining in sinus rhythm since then.  He remains on therapeutic Coumadin because of his mechanical aortic valve prosthesis.  The patient states that other than his episode of atrial fibrillation, he has been feeling quite well and he has no physical problems or complaints.  He specifically denies any chest discomfort that could in anyway be attributable to his thoracic aorta.  His activity level is good.  He has no shortness of breath.  The remainder of his review of systems is unremarkable.  CURRENT MEDICATIONS:  Coumadin, pravastatin, aspirin, lisinopril, and Tylenol.  PHYSICAL EXAMINATION:  Notable for a well-appearing male with blood pressure 156/78, pulse 60 and regular, and oxygen saturation 98% on room air.  Examination of the chest reveals clear breath sounds that are symmetrical bilaterally.  No wheezes, rales, or rhonchi are noted. Cardiovascular exam  includes regular rate and rhythm.  Mechanical prosthetic valve heart sounds are noted.  No murmurs, rubs, or gallops are noted.  The abdomen is soft and nontender.  The extremities are warm and well perfused.  Distal pulses are easily palpable.  There is no lower extremity edema.  The remainder of his physical exam is noncontributory.  DIAGNOSTIC TESTS:  CT angiogram of the chest performed earlier today at [facility] is reviewed.  This demonstrates stable radiographic appearance of the entire thoracic aorta.  Specifically, the proximal ascending thoracic aorta measured between 4.9 and 5.0 cm.  This is unchanged.  This tapers down to less than 4 cm at the transverse arch and the descending thoracic aorta measures approximately 3 cm.  There has been no change since last year's exam.  No other abnormalities are noted.  IMPRESSION:  Stable moderate aneurysmal enlargement of the proximal ascending thoracic aorta in this 50 year old patient, who underwent aortic valve replacement in the remote past using a mechanical prosthesis.  The patient remains stable at this time.  His blood pressure is running a little bit elevated.  He is not on any type of beta-blocker, although his resting pulse is only 60.  PLAN:  We will plan to see the patient back in 1 year's time for further followup and a repeat CT angiogram.  I have suggested that he keep a very close eye  on his blood pressure and consult further with Dr. Gala Romney and/or Dr. Alwyn Ren should his blood pressure remain consistently elevated greater than 140 mmHg systolic or 80 mmHg diastolic.  We have not recommended any changes in his current medications at this time.  It sounds as though he is not having any symptoms at this time that might be attributable to his underlying cholelithiasis.  We will leave this to his discretion as to whether or not he proceeds this further.  All of his questions have been addressed.  Mark [name], M.D. Electronically Signed  CHO/MEDQ  D:  07/28/2010  T:  07/28/2010  Job:  [id number]  cc:   Bevelyn Buckles. Bensimhon, MD Titus Dubin. Alwyn Ren, MD,FACP,FCCP

## 2010-10-16 NOTE — Medication Information (Signed)
Summary: [facility]  Anticoagulant Therapy  Managed by: Windell Hummingbird, RN Referring MD: Shirlee Limerick PCP: none Supervising MD: Shirlee Latch MD, Lillianna Sabel Indication 1: Aortic Valve Disorder (ICD-424.1) Indication 2: Aortic Valve Replacement (ICD-V43.3) Lab Used: [facility] [place] Site: [address] INR POC 1.7 INR RANGE 3.5 - 4.5  Dietary changes: no    Health status changes: no    Bleeding/hemorrhagic complications: no    Recent/future hospitalizations: no    Any changes in medication regimen? no    Recent/future dental: no  Any missed doses?: yes     Details: Has been off coumadin approx. 4 days  Is patient compliant with meds? yes       Allergies: No Known Drug Allergies  Anticoagulation Management History:      The patient is taking warfarin and comes in today for a routine follow up visit.  Negative risk factors for bleeding include an age less than 71 years old.  The bleeding index is 'low risk'.  Negative CHADS2 values include Age > 48 years old.  The start date was 05/15/1982.  His last INR was 2.1 ratio.  Anticoagulation responsible provider: Shirlee Latch MD, [employer].  INR POC: 1.7.  Exp: 08/2011.    Anticoagulation Management Assessment/Plan:      The patient's current anticoagulation dose is Coumadin 10 mg tabs: Take as directed by coumadin clinic..  The target INR is 3.5-4.5.  The next INR is due 10/22/2010.  Anticoagulation instructions were given to patient.  Results were reviewed/authorized by Windell Hummingbird, RN.  He was notified by Windell Hummingbird, RN.         Prior Anticoagulation Instructions: INR 4.5  Continue same dose of 1 1/2 tablets every day except 1 tablet on Thursday.  Recheck INR in 3 weeks.   Current Anticoagulation Instructions:  Take 2 mg. today, then resume 1.5 tablets every day, except 1 tablet on Thursdays. Recheck in 2 weeks.

## 2010-10-28 NOTE — Letter (Signed)
Summary: [facility]  [facility]   Imported By: Maryln Gottron 10/21/2010 15:53:47  _____________________________________________________________________  External Attachment:    Type:   Image     Comment:   External Document

## 2010-11-13 LAB — URINALYSIS, ROUTINE W REFLEX MICROSCOPIC
Bilirubin Urine: NEGATIVE
Glucose, UA: NEGATIVE mg/dL
Ketones, ur: NEGATIVE mg/dL
Nitrite: NEGATIVE
Protein, ur: NEGATIVE mg/dL
Specific Gravity, Urine: 1.019 (ref 1.005–1.030)
Urobilinogen, UA: 1 mg/dL (ref 0.0–1.0)
pH: 6 (ref 5.0–8.0)

## 2010-11-13 LAB — DIFFERENTIAL
Basophils Absolute: 0 10*3/uL (ref 0.0–0.1)
Basophils Relative: 0 % (ref 0–1)
Eosinophils Absolute: 0 10*3/uL (ref 0.0–0.7)
Eosinophils Relative: 0 % (ref 0–5)
Lymphocytes Relative: 7 % — ABNORMAL LOW (ref 12–46)
Lymphs Abs: 0.9 10*3/uL (ref 0.7–4.0)
Monocytes Absolute: 0.7 10*3/uL (ref 0.1–1.0)
Monocytes Relative: 6 % (ref 3–12)
Neutro Abs: 10.2 10*3/uL — ABNORMAL HIGH (ref 1.7–7.7)
Neutrophils Relative %: 87 % — ABNORMAL HIGH (ref 43–77)

## 2010-11-13 LAB — POCT I-STAT, CHEM 8
BUN: 15 mg/dL (ref 6–23)
Calcium, Ion: 1.13 mmol/L (ref 1.12–1.32)
Chloride: 102 mEq/L (ref 96–112)
Creatinine, Ser: 0.9 mg/dL (ref 0.4–1.5)
Glucose, Bld: 145 mg/dL — ABNORMAL HIGH (ref 70–99)
HCT: 37 % — ABNORMAL LOW (ref 39.0–52.0)
Hemoglobin: 12.6 g/dL — ABNORMAL LOW (ref 13.0–17.0)
Potassium: 3.5 mEq/L (ref 3.5–5.1)
Sodium: 139 mEq/L (ref 135–145)
TCO2: 26 mmol/L (ref 0–100)

## 2010-11-13 LAB — COMPREHENSIVE METABOLIC PANEL
ALT: 39 U/L (ref 0–53)
AST: 37 U/L (ref 0–37)
Albumin: 4.4 g/dL (ref 3.5–5.2)
Alkaline Phosphatase: 78 U/L (ref 39–117)
BUN: 11 mg/dL (ref 6–23)
CO2: 25 mEq/L (ref 19–32)
Calcium: 9.5 mg/dL (ref 8.4–10.5)
Chloride: 105 mEq/L (ref 96–112)
Creatinine, Ser: 1.04 mg/dL (ref 0.4–1.5)
GFR calc Af Amer: >60 mL/min (ref >60)
GFR calc non Af Amer: >60 mL/min (ref >60)
Glucose, Bld: 123 mg/dL — ABNORMAL HIGH (ref 70–99)
Potassium: 4.1 mEq/L (ref 3.5–5.1)
Sodium: 137 mEq/L (ref 135–145)
Total Bilirubin: 2.2 mg/dL — ABNORMAL HIGH (ref 0.3–1.2)
Total Protein: 7.5 g/dL (ref 6.0–8.3)

## 2010-11-13 LAB — CBC
HCT: 34.6 % — ABNORMAL LOW (ref 39.0–52.0)
HCT: 39.4 % (ref 39.0–52.0)
Hemoglobin: 11.8 g/dL — ABNORMAL LOW (ref 13.0–17.0)
Hemoglobin: 14.1 g/dL (ref 13.0–17.0)
MCH: 30.4 pg (ref 26.0–34.0)
MCH: 31.3 pg (ref 26.0–34.0)
MCHC: 34.1 g/dL (ref 30.0–36.0)
MCHC: 35.8 g/dL (ref 30.0–36.0)
MCV: 89.2 fL (ref 78.0–100.0)
MCV: 87.6 fL (ref 78.0–100.0)
Platelets: 171 10*3/uL (ref 150–400)
Platelets: 146 10*3/uL — ABNORMAL LOW (ref 150–400)
RBC: 3.88 MIL/uL — ABNORMAL LOW (ref 4.22–5.81)
RBC: 4.5 MIL/uL (ref 4.22–5.81)
RDW: 13.1 % (ref 11.5–15.5)
RDW: 12.5 % (ref 11.5–15.5)
WBC: 6.5 10*3/uL (ref 4.0–10.5)
WBC: 11.7 10*3/uL — ABNORMAL HIGH (ref 4.0–10.5)

## 2010-11-13 LAB — PROTIME-INR
INR: 3.86 — ABNORMAL HIGH (ref 0.00–1.49)
INR: 4.96 — ABNORMAL HIGH (ref 0.00–1.49)
INR: 3.65 — ABNORMAL HIGH (ref 0.00–1.49)
Prothrombin Time: 37.9 seconds — ABNORMAL HIGH (ref 11.6–15.2)
Prothrombin Time: 45.9 seconds — ABNORMAL HIGH (ref 11.6–15.2)
Prothrombin Time: 36.3 seconds — ABNORMAL HIGH (ref 11.6–15.2)

## 2010-11-13 LAB — TROPONIN I: Troponin I: 0.05 ng/mL (ref 0.00–0.06)

## 2010-11-13 LAB — URINE CULTURE
Colony Count: >=100000
Culture  Setup Time: 201108180257

## 2010-11-13 LAB — POCT CARDIAC MARKERS
CKMB, poc: <1 ng/mL — ABNORMAL LOW (ref 1.0–8.0)
Myoglobin, poc: 67.8 ng/mL (ref 12–200)
Troponin i, poc: <0.05 ng/mL (ref 0.00–0.09)

## 2010-11-13 LAB — URINE MICROSCOPIC-ADD ON

## 2010-11-13 LAB — CK TOTAL AND CKMB (NOT AT ARMC)
CK, MB: 2.6 ng/mL (ref 0.3–4.0)
Relative Index: 1.4 (ref 0.0–2.5)
Total CK: 191 U/L (ref 7–232)

## 2010-11-17 ENCOUNTER — Emergency Department (HOSPITAL_COMMUNITY): Payer: Self-pay

## 2010-11-17 ENCOUNTER — Emergency Department (HOSPITAL_COMMUNITY)
Admission: EM | Admit: 2010-11-17 | Discharge: 2010-11-17 | Disposition: A | Discharge status: Home / Self Care | Payer: Self-pay | Attending: Emergency Medicine | Admitting: Emergency Medicine

## 2010-11-17 DIAGNOSIS — M79609 Pain in unspecified limb: Secondary | ICD-10-CM | POA: Insufficient documentation

## 2010-11-17 DIAGNOSIS — M7989 Other specified soft tissue disorders: Secondary | ICD-10-CM | POA: Insufficient documentation

## 2010-11-17 DIAGNOSIS — I998 Other disorder of circulatory system: Secondary | ICD-10-CM | POA: Insufficient documentation

## 2010-11-17 DIAGNOSIS — Z7901 Long term (current) use of anticoagulants: Secondary | ICD-10-CM | POA: Insufficient documentation

## 2010-11-17 DIAGNOSIS — E785 Hyperlipidemia, unspecified: Secondary | ICD-10-CM | POA: Insufficient documentation

## 2010-11-17 DIAGNOSIS — I10 Essential (primary) hypertension: Secondary | ICD-10-CM | POA: Insufficient documentation

## 2010-11-17 DIAGNOSIS — Z79899 Other long term (current) drug therapy: Secondary | ICD-10-CM | POA: Insufficient documentation

## 2010-11-17 LAB — PROTIME-INR
INR: 5.59 (ref 0.00–1.49)
Prothrombin Time: 50.4 seconds — ABNORMAL HIGH (ref 11.6–15.2)

## 2010-11-21 ENCOUNTER — Encounter: Payer: PRIVATE HEALTH INSURANCE | Admitting: *Deleted

## 2010-11-23 ENCOUNTER — Emergency Department (HOSPITAL_COMMUNITY): Payer: Self-pay

## 2010-11-23 ENCOUNTER — Emergency Department (HOSPITAL_COMMUNITY)
Admission: EM | Admit: 2010-11-23 | Discharge: 2010-11-24 | Disposition: A | Discharge status: Home / Self Care | Payer: Self-pay | Attending: Emergency Medicine | Admitting: Emergency Medicine

## 2010-11-23 DIAGNOSIS — Z7901 Long term (current) use of anticoagulants: Secondary | ICD-10-CM | POA: Insufficient documentation

## 2010-11-23 DIAGNOSIS — M79609 Pain in unspecified limb: Secondary | ICD-10-CM | POA: Insufficient documentation

## 2010-11-23 DIAGNOSIS — M25619 Stiffness of unspecified shoulder, not elsewhere classified: Secondary | ICD-10-CM | POA: Insufficient documentation

## 2010-11-23 DIAGNOSIS — M25519 Pain in unspecified shoulder: Secondary | ICD-10-CM | POA: Insufficient documentation

## 2010-11-23 DIAGNOSIS — Y92009 Unspecified place in unspecified non-institutional (private) residence as the place of occurrence of the external cause: Secondary | ICD-10-CM | POA: Insufficient documentation

## 2010-11-23 DIAGNOSIS — W1809XA Striking against other object with subsequent fall, initial encounter: Secondary | ICD-10-CM | POA: Insufficient documentation

## 2010-11-23 DIAGNOSIS — I10 Essential (primary) hypertension: Secondary | ICD-10-CM | POA: Insufficient documentation

## 2010-11-23 DIAGNOSIS — Z79899 Other long term (current) drug therapy: Secondary | ICD-10-CM | POA: Insufficient documentation

## 2010-11-23 DIAGNOSIS — M7989 Other specified soft tissue disorders: Secondary | ICD-10-CM | POA: Insufficient documentation

## 2010-11-23 DIAGNOSIS — E785 Hyperlipidemia, unspecified: Secondary | ICD-10-CM | POA: Insufficient documentation

## 2010-11-23 DIAGNOSIS — S40029A Contusion of unspecified upper arm, initial encounter: Secondary | ICD-10-CM | POA: Insufficient documentation

## 2010-11-24 ENCOUNTER — Emergency Department (HOSPITAL_COMMUNITY): Payer: Self-pay

## 2010-11-24 LAB — PROTIME-INR
INR: 3.1 — ABNORMAL HIGH (ref 0.00–1.49)
Prothrombin Time: 32 seconds — ABNORMAL HIGH (ref 11.6–15.2)

## 2010-11-24 LAB — CBC
HCT: 38.1 % — ABNORMAL LOW (ref 39.0–52.0)
Hemoglobin: 13.3 g/dL (ref 13.0–17.0)
MCH: 30.8 pg (ref 26.0–34.0)
MCHC: 34.9 g/dL (ref 30.0–36.0)
MCV: 88.2 fL (ref 78.0–100.0)
Platelets: 190 10*3/uL (ref 150–400)
RBC: 4.32 MIL/uL (ref 4.22–5.81)
RDW: 12.5 % (ref 11.5–15.5)
WBC: 13.5 10*3/uL — ABNORMAL HIGH (ref 4.0–10.5)

## 2010-11-24 LAB — DIFFERENTIAL
Basophils Absolute: 0 10*3/uL (ref 0.0–0.1)
Basophils Relative: 0 % (ref 0–1)
Eosinophils Absolute: 0.1 10*3/uL (ref 0.0–0.7)
Eosinophils Relative: 1 % (ref 0–5)
Lymphocytes Relative: 25 % (ref 12–46)
Lymphs Abs: 3.3 10*3/uL (ref 0.7–4.0)
Monocytes Absolute: 0.9 10*3/uL (ref 0.1–1.0)
Monocytes Relative: 7 % (ref 3–12)
Neutro Abs: 9.2 10*3/uL — ABNORMAL HIGH (ref 1.7–7.7)
Neutrophils Relative %: 68 % (ref 43–77)

## 2010-12-05 ENCOUNTER — Encounter: Payer: Self-pay | Admitting: Internal Medicine

## 2010-12-07 LAB — CBC
HCT: 39.2 % (ref 39.0–52.0)
Hemoglobin: 13.6 g/dL (ref 13.0–17.0)
MCHC: 34.8 g/dL (ref 30.0–36.0)
MCV: 90.1 fL (ref 78.0–100.0)
Platelets: 157 10*3/uL (ref 150–400)
RBC: 4.36 MIL/uL (ref 4.22–5.81)
RDW: 13.2 % (ref 11.5–15.5)
WBC: 9.8 10*3/uL (ref 4.0–10.5)

## 2010-12-07 LAB — PROTIME-INR
INR: 4.8 — ABNORMAL HIGH (ref 0.00–1.49)
Prothrombin Time: 49.2 seconds — ABNORMAL HIGH (ref 11.6–15.2)

## 2010-12-07 LAB — COMPREHENSIVE METABOLIC PANEL
ALT: 34 U/L (ref 0–53)
AST: 31 U/L (ref 0–37)
Albumin: 4.2 g/dL (ref 3.5–5.2)
Alkaline Phosphatase: 72 U/L (ref 39–117)
BUN: 13 mg/dL (ref 6–23)
CO2: 25 mEq/L (ref 19–32)
Calcium: 9.2 mg/dL (ref 8.4–10.5)
Chloride: 107 mEq/L (ref 96–112)
Creatinine, Ser: 0.96 mg/dL (ref 0.4–1.5)
GFR calc Af Amer: >60 mL/min (ref >60)
GFR calc non Af Amer: >60 mL/min (ref >60)
Glucose, Bld: 106 mg/dL — ABNORMAL HIGH (ref 70–99)
Potassium: 4.5 mEq/L (ref 3.5–5.1)
Sodium: 139 mEq/L (ref 135–145)
Total Bilirubin: 1.4 mg/dL — ABNORMAL HIGH (ref 0.3–1.2)
Total Protein: 7 g/dL (ref 6.0–8.3)

## 2010-12-07 LAB — DIFFERENTIAL
Basophils Absolute: 0 10*3/uL (ref 0.0–0.1)
Basophils Relative: 0 % (ref 0–1)
Eosinophils Absolute: 0 10*3/uL (ref 0.0–0.7)
Eosinophils Relative: 1 % (ref 0–5)
Lymphocytes Relative: 15 % (ref 12–46)
Lymphs Abs: 1.5 10*3/uL (ref 0.7–4.0)
Monocytes Absolute: 0.5 10*3/uL (ref 0.1–1.0)
Monocytes Relative: 6 % (ref 3–12)
Neutro Abs: 7.7 10*3/uL (ref 1.7–7.7)
Neutrophils Relative %: 79 % — ABNORMAL HIGH (ref 43–77)

## 2010-12-07 LAB — LACTIC ACID, PLASMA: Lactic Acid, Venous: 1.2 mmol/L (ref 0.5–2.2)

## 2010-12-07 LAB — APTT: aPTT: 69 seconds — ABNORMAL HIGH (ref 24–37)

## 2010-12-11 ENCOUNTER — Ambulatory Visit (INDEPENDENT_AMBULATORY_CARE_PROVIDER_SITE_OTHER): Payer: Self-pay | Admitting: Internal Medicine

## 2010-12-11 ENCOUNTER — Ambulatory Visit (INDEPENDENT_AMBULATORY_CARE_PROVIDER_SITE_OTHER): Payer: Self-pay | Admitting: Cardiovascular Disease

## 2010-12-11 ENCOUNTER — Encounter: Payer: Self-pay | Admitting: Internal Medicine

## 2010-12-11 VITALS — BP 124/74 | HR 67 | Resp 18 | Ht 70.0 in | Wt 225.4 lb

## 2010-12-11 DIAGNOSIS — I350 Nonrheumatic aortic (valve) stenosis: Secondary | ICD-10-CM

## 2010-12-11 DIAGNOSIS — I4891 Unspecified atrial fibrillation: Secondary | ICD-10-CM

## 2010-12-11 DIAGNOSIS — Z952 Presence of prosthetic heart valve: Secondary | ICD-10-CM

## 2010-12-11 DIAGNOSIS — Z954 Presence of other heart-valve replacement: Secondary | ICD-10-CM

## 2010-12-11 DIAGNOSIS — I359 Nonrheumatic aortic valve disorder, unspecified: Secondary | ICD-10-CM

## 2010-12-11 DIAGNOSIS — Z7901 Long term (current) use of anticoagulants: Secondary | ICD-10-CM | POA: Insufficient documentation

## 2010-12-11 DIAGNOSIS — E785 Hyperlipidemia, unspecified: Secondary | ICD-10-CM

## 2010-12-11 LAB — POCT INR: INR: 5.9

## 2010-12-11 NOTE — Assessment & Plan Note (Signed)
Due for repeat lipid check.

## 2010-12-11 NOTE — Assessment & Plan Note (Signed)
Quiescent. Continue coumadin for AF and valve.

## 2010-12-11 NOTE — Patient Instructions (Signed)
Your physician has requested that you have an echocardiogram. Echocardiography is a painless test that uses sound waves to create images of your heart. It provides your doctor with information about the size and shape of your heart and how well your heart's chambers and valves are working. This procedure takes approximately one hour. There are no restrictions for this procedure. Your physician recommends that you return for a FASTING lipid profile: with echo Your physician wants you to follow-up in: 1 year. You will receive a reminder letter in the mail two months in advance. If you don't receive a letter, please call our office to schedule the follow-up appointment.

## 2010-12-11 NOTE — Assessment & Plan Note (Signed)
Doing well. Due for repeat echo. Continue coumadin.

## 2010-12-11 NOTE — Assessment & Plan Note (Deleted)
Doing well. Due for repeat echo. Continue coumadin.  

## 2010-12-11 NOTE — Progress Notes (Signed)
HPI:  Mark [name] is a very pleasant 50 year old male with a history of bicuspid aortic valve complicated by endocarditis.  He is status post St. Jude aortic valve replacement in 1983.  He also has a history of hyperlipidemia and PAF s/p DC-CV in 9/11. OSA noncompliant  With CPAP.   Over the past few years year, he has had some progression in the gradients across his valve.  He underwent cardiac catheterization in May 2009 which showed normal coronary arteries.  We did not cross the valve, obviously this is a mechanical valve.  However, the valve leaflets were seen to be opening well on fluoroscopy.  He did undergo a TEE.  While there was some turbulence around the valve, the leaflets seem to be moving well.  There was no obvious pannus formation.  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 He was seen by Dr. [name] who agreed  with continue watchul waiting.  Here for f/u. 3 weeks ago fell and fractured his right humerus. Otherwise doing well. Able to work without much difficulty. No exertional dyspnea, CP, orthopnea or PND. No syncope or presyncope. Frequent brief palpitations. Snores heavily. Not using hs CPAP.   Last lipids: TC 183 TG 106 HDL 36 LDL 126  ROS: All systems negative except as listed in HPI, PMH and Problem List.  Past Medical History  Diagnosis Date  . Ascending aortic aneurysm   . Gallstones     Current Outpatient Prescriptions  Medication Sig Dispense Refill  . amoxicillin (AMOXIL) 500 MG capsule Take 4 tabs 1 hour prior to dental procedures       . aspirin 81 MG tablet Take 81 mg by mouth daily.        Marland Kitchen lisinopril (PRINIVIL,ZESTRIL) 20 MG tablet Take 20 mg by mouth daily.        . simvastatin (ZOCOR) 40 MG tablet Take 40 mg by mouth at bedtime.        Marland Kitchen warfarin (COUMADIN) 10 MG tablet Take by mouth as directed.        Marland Kitchen DISCONTD: promethazine (PHENERGAN) 25 MG suppository Place 25 mg  rectally every 6 (six) hours as needed.        Marland Kitchen DISCONTD: sildenafil (VIAGRA) 100 MG tablet Take 100 mg by mouth daily as needed.           PHYSICAL EXAM: Filed Vitals:   12/11/10 1151  BP: 124/74  Pulse: 67  Resp: 18   General:  Well appearing. no resp difficulty HEENT: normal Neck: supple. no JVD. Carotids 2+ bilat; +bilat radiated bruits.  Cor: PMI nondisplaced. Regular rate & rhythm. No rubs, gallops, mechanical s2. very crisp. 2/6 SEM at RSB Lungs: clear Abdomen: Obese.NT. nondistended.Peri Jefferson bowel sounds. Extremities: no cyanosis, clubbing, rash, edema. Large ecchymosis right upper arm with limited ROM Neuro: alert & orientedx3, cranial nerves grossly intact. moves all 4 extremities w/o difficulty. affect pleasant   ECG: NSR 67 inferior-lateral TWI    ASSESSMENT & PLAN:

## 2010-12-19 ENCOUNTER — Other Ambulatory Visit (INDEPENDENT_AMBULATORY_CARE_PROVIDER_SITE_OTHER): Payer: Self-pay | Admitting: *Deleted

## 2010-12-19 ENCOUNTER — Ambulatory Visit (INDEPENDENT_AMBULATORY_CARE_PROVIDER_SITE_OTHER): Payer: Self-pay | Admitting: *Deleted

## 2010-12-19 ENCOUNTER — Ambulatory Visit (HOSPITAL_COMMUNITY): Payer: Self-pay | Attending: Internal Medicine | Admitting: Radiology

## 2010-12-19 ENCOUNTER — Encounter: Payer: Self-pay | Admitting: *Deleted

## 2010-12-19 DIAGNOSIS — E785 Hyperlipidemia, unspecified: Secondary | ICD-10-CM

## 2010-12-19 DIAGNOSIS — I080 Rheumatic disorders of both mitral and aortic valves: Secondary | ICD-10-CM | POA: Insufficient documentation

## 2010-12-19 DIAGNOSIS — Z954 Presence of other heart-valve replacement: Secondary | ICD-10-CM | POA: Insufficient documentation

## 2010-12-19 DIAGNOSIS — I359 Nonrheumatic aortic valve disorder, unspecified: Secondary | ICD-10-CM

## 2010-12-19 DIAGNOSIS — I079 Rheumatic tricuspid valve disease, unspecified: Secondary | ICD-10-CM | POA: Insufficient documentation

## 2010-12-19 DIAGNOSIS — I379 Nonrheumatic pulmonary valve disorder, unspecified: Secondary | ICD-10-CM | POA: Insufficient documentation

## 2010-12-19 DIAGNOSIS — Z952 Presence of prosthetic heart valve: Secondary | ICD-10-CM

## 2010-12-19 DIAGNOSIS — I4891 Unspecified atrial fibrillation: Secondary | ICD-10-CM

## 2010-12-19 LAB — BASIC METABOLIC PANEL
BUN: 16 mg/dL (ref 6–23)
CO2: 28 mEq/L (ref 19–32)
Calcium: 9.2 mg/dL (ref 8.4–10.5)
Chloride: 105 mEq/L (ref 96–112)
Creatinine, Ser: 1 mg/dL (ref 0.4–1.5)
GFR: 90.12 mL/min (ref >60.00)
Glucose, Bld: 86 mg/dL (ref 70–99)
Potassium: 4.5 mEq/L (ref 3.5–5.1)
Sodium: 139 mEq/L (ref 135–145)

## 2010-12-19 LAB — POCT INR: INR: 4.5

## 2010-12-19 MED ORDER — WARFARIN SODIUM 10 MG PO TABS: 10.0000 mg | ORAL_TABLET | ORAL | Status: DC

## 2010-12-26 ENCOUNTER — Telehealth: Payer: Self-pay | Admitting: *Deleted

## 2010-12-26 NOTE — Telephone Encounter (Signed)
12/26/10--after giving mr Dalsanto his ECHO results he asked what his cholesterole was--after talking with h schub, we realized the lipids were not done so advised pt we would draw them at his next coumadin visit--reminded pt to be fasting--nt

## 2011-01-13 NOTE — Assessment & Plan Note (Signed)
OFFICE VISIT   Mark [name], Mark [name]  DOB:  07-16-1961                                        Jan 09, 2008  CHART #:  [id number]   HISTORY OF PRESENT ILLNESS:  Mark [name] returns for further  followup related to his ascending thoracic aortic aneurysm.  He was  originally seen in consultation on April 27.  Since then, he underwent  transesophageal echocardiogram and cardiac catheterization by Dr.  Gala Romney on Jan 05, 2008.  Transesophageal echocardiogram demonstrates  normally functioning mechanical prosthesis in the aortic position.  There does not appear to be significant pannus ingrowths and the valve  is functioning normally.  There is no perivalvular leak.  Left  ventricular function is normal.  Cardiac catheterization demonstrates  normal coronary arteries with normal right heart pressures.  The aortic  valve is opening normally on fluoroscopy.   I discussed matters at length with Mark [name] and his wife, and I  have discussed these findings over the telephone with Dr. Gala Romney.  In  itself, I do not feel that Mark [name] ascending thoracic aortic  is large enough to require surgery at this point in time.  It may well  be that his ascending aorta has been large ever since the time his  aortic valve was originally replaced.  His mechanical valve seems to be  functioning normally, despite suggestion from recent chest wall echo of  the presence of significant transvalvular gradient.  Clinically, he is  doing fine.  I support continued close observation with serial  echocardiograms and CT angiograms.   PLAN:  We will see Mark [name] back in six months' time.  Dr.  Gala Romney plans a followup echocardiogram at that time, and we will  obtain a CT angiogram to formally evaluate the size of his aorta.   Mark [name], M.D.  Electronically Signed   CHO/MEDQ  D:  01/09/2008  T:  01/09/2008  Job:  [id number]   cc:   Bevelyn Buckles. Bensimhon,  MD  Titus Dubin. Alwyn Ren, MD,FACP,FCCP

## 2011-01-13 NOTE — Procedures (Signed)
[facility]                              EXERCISE TREADMILL   CLOY, COZZENS                MRN:          [id number]  DATE:07/18/2008                            DOB:          04-Feb-1961    Stress test was done to look at exercise tolerance.  CPX was actually  listed in the plan, but the stress test was ordered.  The patient  exercised 12 minutes 48 seconds on a Bruce protocol, peak maximum heart  rate got up to 157 which was 89% of the maximum predicted heart rate.  Blood pressure remained stable.  Test was stopped due to back pain.  The  patient had no EKG changes.  He had an occasional PVC.  No chest pain.  He was a little short of breath, but overall had excellent exercise  tolerance and good stress test.      Jacolyn Reedy, PA-C  Electronically Signed      Bevelyn Buckles. Bensimhon, MD  Electronically Signed   ML/MedQ  DD: 07/18/2008  DT: 07/18/2008  Job #: [id number]

## 2011-01-13 NOTE — Assessment & Plan Note (Signed)
OFFICE VISIT   Mark [name], Mark [name]  DOB:  Oct 09, 1960                                        July 15, 2009  CHART #:  [id number]   HISTORY:  The patient returns to the office today for annual followup  and surveillance related to his ascending thoracic aortic aneurysm.  He  was last seen here in the office July 23, 2008.  Since then, he  reports that he has done well with no new significant medical problems  or complaints.  He reports normal physical activity.  He has not had any  exertional chest pain or shortness of breath.  He has not had any other  reasons to be hospitalized or to see a physician for any problems.  He  does report that over the last month or 2, he has been having increasing  problems with postprandial indigestion, which he describes as epigastric  pain that is brought on usually shortly after meals and lasts for a  period of time.  Initially, he thought this was related to eating spicy  foods, but now seems to happen with any type of eating.  He has not had  any fevers or chills.  He has not had any nausea or vomiting.  The  remainder of his review of systems is unremarkable.  The remainder of  his past medical history is unchanged.   CURRENT MEDICATIONS:  Coumadin, Diovan, pravastatin, aspirin,  lisinopril, Zocor, and Tylenol as needed.   PHYSICAL EXAMINATION:  Notable for a well-appearing male with blood  pressure 155/74, pulse 66 and regular, and oxygen saturation 98% on room  air.  HEENT exam is unrevealing.  The neck is supple.  Auscultation of  the chest reveals clear breath sounds, which are symmetrical  bilaterally.  No wheezes or rhonchi are noted.  Cardiovascular exam is  notable for regular rate and rhythm.  There is a grade 2-3/6 systolic  murmur heard along the right sternal border.  Mechanical prosthetic  valve heart sounds can be appreciated.  No diastolic murmurs are noted.  The abdomen is soft and  nontender.  Bowel sounds are present.  The  extremities are warm and well perfused.  There is no lower extremity  edema.   DIAGNOSTIC TESTS:  CT angiogram performed today at the [facility] is reviewed.  This demonstrates stable radiographic  appearance of moderate aneurysmal enlargement of the ascending thoracic  aorta.  The maximum transverse diameter of the aorta is 5.0 cm, which is  essentially unchanged from last year's exam.  No new abnormalities are  noted.  The descending thoracic aorta measures 3.0 cm for comparison.  Of note, there is numerous gallstones present within the gallbladder,  which is small and somewhat contracted.  There is no pericholecystic  fluid or other findings to suggest the presence of ongoing acute  cholecystitis.   IMPRESSION:  1. Stable moderate aneurysmal enlargement of the ascending thoracic      aorta measuring 5.0 cm in diameter.  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  2. The patient has postprandial epigastric abdominal pain associated      with multiple gallstones within the gallbladder suggestive of      possible symptomatic  cholelithiasis.   PLAN:  We will make arrangements for the patient to be evaluated by one  of the general surgeons here in town to consider whether or not  abdominal ultrasound is indicated or whether or not based upon the  patient's symptomatology and CT findings, he should simply have his  gallbladder removed.  We will plan to see the patient back in 1 year's  time for followup CT angiogram.   Mark [name], M.D.  Electronically Signed   CHO/MEDQ  D:  07/15/2009  T:  07/16/2009  Job:  [id number]   cc:   Bevelyn Buckles. Bensimhon, MD  Titus Dubin. Alwyn Ren, MD,FACP,FCCP  Gabrielle Dare Janee Morn, M.D.

## 2011-01-13 NOTE — Assessment & Plan Note (Signed)
[facility]                            CARDIOLOGY OFFICE NOTE   Mark [name], Mark [name]                MRN:          [id number]  DATE:07/13/2007                            DOB:          1961/08/09    PRIMARY CARE PHYSICIAN:  Dr. Marga Melnick   INTERVAL HISTORY:  Mark [name] is a delightful 50 year old male with  a history of aortic valve replacement due to endocarditis back in 1984.  He also has a history of hypertension, hyperlipidemia, obstructive sleep  apnea.  He presents today for routine followup.   He is doing very well.  He denies any chest pain or shortness of breath,  he has been active.  He had a followup echocardiogram which showed an  ejection fraction of 55%.  His St. Jude valve appeared to be functioning  well however, there was a question of increasing velocities across the  valve.  His echo from last year showed a mean gradient around 20 and  this gradient was 31.  I reviewed the echo's and it appears that in the  4-chamber view the gradients are approximately the same however, in the  5-chamber view the gradients on this echo are considerably higher than  what is found on the  4-chamber.  The gradient was not measured previously in the 5-chamber  view.   He has not had any lower extremity edema or any signs of heart failure.   CURRENT MEDICATIONS:  Coumadin, aspirin 81, lisinopril 20 and  pravastatin 20.   PHYSICAL EXAMINATION:  GENERAL:  He is well-appearing, in no acute  distress. Ambulates around the clinic without any respiratory  difficulty.  VITAL SIGNS:  Blood pressure is 116/80, heart rate 72, weight is 219.  HEENT:  Normal.  NECK:  Supple, there is no JVD. Carotids are 2+ bilaterally with  bilaterally radiated bruits, there is no lymphadenopathy or thyromegaly.  CARDIAC:  PMI is nondisplaced. Regular rate and rhythm with a 2/6  systolic ejection murmur radiating over the aortic valve, the closing  sound is  crisp.  LUNGS:  Clear.  ABDOMEN:  Soft, nontender, nondistended. There is no hepatosplenomegaly,  no bruits, no masses. Good bowel sounds.  EXTREMITIES:  Are warm with no clubbing, cyanosis or edema.  No rash.  NEURO:  He is alert and oriented x3. Cranial nerves II-XII are intact.  He moves all four extremities without difficulty.   LABORATORY DATA:  Total cholesterol 213, triglycerides 138, HDL 33 and  LDL of 148 which is down from 175.  He has been compliant with his  Pravastatin.   ASSESSMENT AND PLAN:  1. Aortic valve disease.  He is 24 years status post valve      replacement.  He does have some elevated velocities across the      valve but I am not sure if these are actually increasing.  He is      asymptomatic. We will watch the valve very closely, repeat his      echocardiogram in 3 months and if necessary he will need a TEE.      Continue Coumadin.  2. Hyperlipidemia.  Will switch  him from Pravastatin to a higher      potency Statin such as Zocor 40 mg a day.  I did remind him to      watch his diet and get more exercise.  3. Hypertension.  Very nicely controlled, continue current therapy.     Bevelyn Buckles. Bensimhon, MD  Electronically Signed    DRB/MedQ  DD: 07/13/2007  DT: 07/13/2007  Job #: [id number]   cc:   Titus Dubin. Alwyn Ren, MD,FACP,FCCP
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
## 2011-01-13 NOTE — Assessment & Plan Note (Signed)
OFFICE VISIT   Mark [name], Mark [name]  DOB:  October 29, 1960                                        July 23, 2008  CHART #:  [id number]   HISTORY OF PRESENT ILLNESS:  The patient returns for followup CT  angiogram of his ascending thoracic aortic aneurysm.  She was originally  seen in consultation on December 26, 2007.  Since then, he has done quite  well.  He continues to follow up with Dr. Gala Romney, and apparently, he  underwent a stress test last week and did remarkably well.  He reports  no problems or complaints.  He specifically denies any exertional  shortness of breath.  He has not had any chest pain.  He feels well and  has no other complaints.  His past medical history is unchanged.   PHYSICAL EXAMINATION:  GENERAL:  Notable for well-appearing male.  VITAL SIGNS:  Blood pressure 128/82, pulse 58, and oxygen saturation 97%  on room air.  CHEST:  Clear breath sounds, which are symmetrical bilaterally.  No  wheezes or rhonchi are noted.  CARDIOVASCULAR:  Demonstrates regular rate and rhythm.  Mechanical  prosthetic valve heart sounds can be noted.  A soft systolic murmur is  noted.  No diastolic murmurs are appreciated.  ABDOMEN:  Soft, nontender.  EXTREMITIES:  Warm and well perfused.  There is no lower extremity  edema.   DIAGNOSTIC TEST:  CT angiogram of the thoracic aorta performed at  [facility] today is reviewed.  This demonstrates moderate  aneurysmal dilatation of the ascending thoracic aorta, which tapers down  to normal at the level of the transverse aortic arch.  The maximum  diameter on today's exam is 4.9 cm at the mid ascending thoracic aorta.  This is actually less than what was measured on the scan performed last  spring at [facility].  There are no other complicating factors.   IMPRESSION:  Stable moderate aneurysmal enlargement of the ascending  thoracic aorta.   PLAN:  We will plan to see the patient back in  1 year's time with a  followup CT angiogram.  I suspect that his aorta has been large for  quite some time and it is only recently that this was noted.  I have  reassured him that we can continue to follow this conservatively and  probably decrease the frequency of surveillance.  He understands that he  will need to continue to keep his blood pressure under good control as  he has been doing.  He will continue to follow up with Dr. Gala Romney for  long-term management of his medical needs.   Salvatore Decent. [name], M.D.  Electronically Signed   CHO/MEDQ  D:  07/23/2008  T:  07/23/2008  Job:  [id number]   cc:   Bevelyn Buckles. Bensimhon, MD  Titus Dubin. Alwyn Ren, MD,FACP,FCCP

## 2011-01-13 NOTE — Assessment & Plan Note (Signed)
[facility]ALTHCARE                            CARDIOLOGY OFFICE NOTE   LEOPOLD, SMYERS                MRN:          [id number]  DATE:07/11/2008                            DOB:          1960/12/24    INTERVAL HISTORY:  [name] is a very pleasant 50 year old male  with a history of bicuspid aortic valve complicated by endocarditis.  He  is status post St. Jude aortic valve replacement in 1983.  He also has a  history of hyperlipidemia.   Over the past year, he has had some progression in the gradients across  his valve.  He underwent cardiac catheterization in May which showed  normal coronary arteries.  We did not cross the valve, obviously this is  a mechanical valve.  However, the valve leaflets were seen to be opening  well on fluoroscopy.  He did undergo a TEE.  While there was some  turbulence around the valve, the leaflets seem to be moving well.  There  was no obvious pannus formation.  Gradient on his transthoracic echo was  within the moderate range at 33.   This was reviewed with Dr. [name] who has been following him.  It was  decided that we will continue to follow his valve and he was not ready  for a redo replacement at this time.   He comes in today with his wife.  He says that he gets short of breath  with just walking around the block.  His wife has tried to get him to  exercise more and walk; however, he is just not interested in that.  He  says he walks continuously at work without difficulty and he just does  not want to walk anymore.  He has not had any orthopnea, no PND.  He  does snore very heavily at night.  He has been diagnosed with sleep  apnea, but cannot wear his mask.  He has also been having some mild  troubles with some mild erectile dysfunction.   CURRENT MEDICATIONS:  1. Coumadin.  2. Aspirin 81.  3. Lisinopril 20 a day.  4. Zocor 40 a day.   PHYSICAL EXAMINATION:  GENERAL:  He is well appearing,  ambulates in  clinic with no respiratory difficulty.  VITAL SIGNS:  Blood pressure is 112 112/76, heart rate is 58, weight is  215.  HEENT:  Normal.  NECK:  Supple.  There is no JVD.  Carotids are 2+ bilaterally with  bilateral radiated bruits.  There is no lymphadenopathy or thyromegaly.  CARDIAC:  PMI is nondisplaced.  Regular rate and rhythm with a 2/6  systolic ejection murmur radiating over the aortic valve.  The closing  sound is very crisp and can be heard across the room.  LUNGS:  Clear.  ABDOMEN:  Soft, nontender, nondistended.  No hepatosplenomegaly.  No  bruits.  No masses.  Good bowel sounds.  EXTREMITIES:  Warm with no cyanosis, clubbing, or edema.  No rash.  NEUROLOGIC:  Alert and oriented x3.  Cranial nerves II through XII  intact.  Moves all 4 extremities without difficulty.  Affect is  pleasant.  ASSESSMENT AND PLAN:  1. Aortic valve disease.  He obviously has somewhat of an elevated      gradient across his valve, but I do not think that this is the sole      cause of his exercise intolerance.  I think he is significantly      deconditioned.  We will check a followup echocardiogram to make      sure the gradient across the valve is stable.  I do not think he is      ready for a redo replacement.  We will also proceed with      cardiopulmonary exercise test to better define his degree of      limitation and the etiology.  I have asked him to start exercising      more regularly.  2. Sleep apnea.  I have encouraged him to use his continuous positive      airway pressure, but this is not possible.  I told him the only      alternative is to lose at least 20 pounds.  3. Hyperlipidemia.  His lipids were drawn with his last blood draw.      We will need to get these at his next Coumadin visit.  His CRP was      less than 1 which is a good sign.  4. Glucose intolerance.  Most recent fasting glucose was 123.  I have      told him that he is very close to meeting the  criteria for being a      diabetic and that he needs to focus on exercise and weight loss.     Bevelyn Buckles. Bensimhon, MD  Electronically Signed    DRB/MedQ  DD: 07/11/2008  DT: 07/11/2008  Job #: [id number]

## 2011-01-13 NOTE — Cardiovascular Report (Signed)
NAME:  TYQUEZ, HOLLIBAUGH NO.:  [id number]   MEDICAL RECORD NO.:  [id number]          PATIENT TYPE:  OIB   LOCATION:  1963                         FACILITY:  [facility]   PHYSICIAN:  Bevelyn Buckles. Bensimhon, MDDATE OF BIRTH:  1961-05-15   DATE OF PROCEDURE:  01/05/2008  DATE OF DISCHARGE:                            CARDIAC CATHETERIZATION   INTERVAL HISTORY:  Mr. Stumpe is a 50 year old male with a history  of a St. Jude aortic valve replacement due to endocarditis at age 10.  We have been following him with serial echocardiograms and he has had a  moderate gradient across his St. Jude valve.  He was also found to have  significant ascending aortic aneurysm about 5 cm.  He has seen Dr. [name]  to consider possible repair and redo aortic valve replacement.  He was  brought in today for TEE and right heart catheterization and coronary  angiography.   This was done in the outpatient catheterization lab.   PROCEDURES PERFORMED:  1. Right heart catheterization.  2. Selective coronary angiography.   DESCRIPTION OF PROCEDURE:  The risks and indication of the  catheterization were explained.  Consent was signed and placed on the  chart.  A 4-French sheath was placed in the right femoral artery using a  modified Seldinger technique.  Standard catheters including a JL-5,  3DRCA and angled pigtail were used for the catheterization.  A 7-French  venous sheath was placed in the right femoral vein and a standard Swan-  Ganz catheter was used for right heart catheterization.  Obviously,  given his mechanical aortic valve, we did not cross the valve.   Right atrial pressure mean of 8, RV pressure 28/6, PA pressure 23/9 with  a mean of 16, pulmonary capillary wedge pressure mean of 11, central  aortic pressure was 107/71 with a mean of 87.  Fick cardiac output was 6  L/min.  Cardiac index was 2.7 L/min/m2.   Left main was normal.   LAD is a long vessel wrapping the apex that gave  off a large diagonal in  the proximal portion.  They are angiographically normal.   Left circumflex was a nondominant vessel gave off a large branching  ramus and two small posterolaterals, angiographically normal.   Right coronary artery was a moderate-sized dominant vessel gave off a  small acute marginal PDA and posterolateral, was angiographically  normal.   ASSESSMENT:  1. Normal coronary arteries.  2. Normal right heart pressures.  3. Aortic valve is seen opening well under fluoroscopy.   PLAN/DISCUSSION:  At this point, his valve seem to be functioning well.  I cannot rule out the fact that he may have a small amount of pannus  formation around the valve causing some degree of obstruction but this  does not  seem critical.  His arteries looked good.  His aorta is just moderately  dilated.  In his young age, I would favor a watchful waiting with echoes  every 6 months and then considering surgery if he has any significant  change in his transaortic valve gradients or size of his ascending  aortic aneurysm.  I will discuss with  Dr. [name].      Bevelyn Buckles. Bensimhon, MD  Electronically Signed     DRB/MEDQ  D:  01/05/2008  T:  01/06/2008  Job:  [id number]   cc:   Dr. [name]  Dr. Alwyn Ren

## 2011-01-13 NOTE — Assessment & Plan Note (Signed)
[facility] HEALTHCARE                            CARDIOLOGY OFFICE NOTE   BENNO, BRENSINGER                MRN:          [id number]  DATE:03/28/2007                            DOB:          Jan 09, 1961    PRIMARY CARE PHYSICIAN:  Dr. Marga Melnick.   INTERVAL HISTORY:  Mr. Esco is a delightful 50 year old male with  a history of aortic valve replacement due to endocarditis back in 1984.  He also has a history of hypertension, hyperlipidemia, obstructive sleep  apnea and unable to tolerate CPAP.  He presents today for routine  followup.   Overall, he says he is doing fairly well.  He has been having problems  recently with severe low back pain.  He does have a history of lumbar  fusion.  He also says that he has occasional very brief episodes of  chest pain.  These are more like electrical shocks.  They just last a  few seconds and resolve.  They are not related to exertion.  He had a  Myoview 11/2004 which was normal.  He has not had any prolonged  episodes.   Unfortunately, he never started his pravastatin for his cholesterol and  is now also off his Diovan due to cost issues.  He has not had any  problems with his Coumadin or bleeding.   CURRENT MEDICATIONS:  1. Coumadin.  2. Aspirin 81mg  a day.   DRUG INTOLERANCES/ALLERGIES:  He has been unable to tolerate Lipitor due  to severe muscle cramps.  He also apparently had problems with Vytorin.   PHYSICAL EXAMINATION:  He is in some distress due to his low back pain  but he ambulates around the clinic without too much difficulty.  Respirations are unlabored.  Blood pressure is difficult to hear but  appears to be at 140/96.  Heart rate is 62.  Weight is 218.  HEENT:  Normal.  NECK:  Supple.  There is no jugular venous distention.  There is no  lymphadenopathy or thyromegaly.  Carotids are 2+ bilaterally without  bruits.  CARDIAC:  PMI is non-displaced.  He has a regular rate and rhythm  with a  mechanical S2.  His valve sounds are crisp.  There is noted a soft  systolic ejection murmur over the valve.  No rub or gallop.  LUNGS:  Clear.  ABDOMEN:  Soft, nontender, nondistended.  Obese.  No hepatosplenomegaly.  No bruits.  No masses.  Good bowel sounds.  EXTREMITIES:  Warm with no clubbing, cyanosis or edema.  No rash.  NEUROLOGICAL:  Alert and oriented x3.  Cranial nerves II through XII are  intact.  Moves all four extremities without difficulty.  Affect is  pleasant.  EKG shows normal sinus rhythm with inferior T-wave inversion which is  old.   ASSESSMENT AND PLAN:  1. Aortic valve disease status post aortic valve replacement.  He is      doing well.  He had an echocardiogram last year which showed normal      valve function.  I did remind about the need for endocarditis      prophylaxis.  2. Hyperlipidemia.  Most recent LDL is in the 160s.  Will go ahead and      try and restart pravastatin and reinforce to him the need to be      compliant with this.  3. Hypertension.  This is suboptimally controlled after coming off      Diovan.  Will start him on lisinopril 20 mg a day and check a BMET      next week.  4. Chest pain.  This is quite atypical.  However,  he is accumulating      risk factors, and I told him, should this continue or get any      worse, he needs to let me know.  We will consider stress test in      the near future if it persists.     Bevelyn Buckles. Bensimhon, MD  Electronically Signed    DRB/MedQ  DD: 03/28/2007  DT: 03/28/2007  Job #: [id number]   cc:   Titus Dubin. Alwyn Ren, MD,FACP,FCCP

## 2011-01-13 NOTE — Assessment & Plan Note (Signed)
[facility] HEALTHCARE                                 ON-CALL NOTE   MATTHER, LABELL                MRN:          [id number]  DATE:12/04/2008                            DOB:          February 19, 1961    I received a call about a panic lab value of PT equal to 67.5 and an INR  equal to 6.87 on the evening of December 03, 2008.  I was given the  patient's phone number and checked both phone numbers given to me in E-  chart, both were correct.  I called the patient twice right after  receiving the information and then twice an hour later and at that time  due to concern about his HIPPA rules and regulations, I did not leave a  message; however, I called back the following morning on December 04, 2008  and was informed that one of the number is [phone] was no  applicable to the patient, and the other number I called [phone],  there was no answer after 3 calls.  Therefore, I left a message  informing the patient not to take any Coumadin, to call [facility] as soon as possible and talk with Shelby Dubin or any of the  clinicians regarding getting a vitamin K prescription called in.  Also,  the patient should make sure to have a followup appointment at the  Coumadin Clinic as soon as possible set up if he did not already have  one set up.  This was a voice mail message.  I never did speak to the  patient.     Jarrett Ables, [facility]     MS/MedQ  DD: 12/04/2008  DT: 12/05/2008  Job #: [id number]

## 2011-01-16 ENCOUNTER — Other Ambulatory Visit (INDEPENDENT_AMBULATORY_CARE_PROVIDER_SITE_OTHER): Payer: Self-pay | Admitting: *Deleted

## 2011-01-16 ENCOUNTER — Ambulatory Visit (INDEPENDENT_AMBULATORY_CARE_PROVIDER_SITE_OTHER): Payer: Self-pay | Admitting: *Deleted

## 2011-01-16 DIAGNOSIS — Z952 Presence of prosthetic heart valve: Secondary | ICD-10-CM

## 2011-01-16 DIAGNOSIS — I4891 Unspecified atrial fibrillation: Secondary | ICD-10-CM

## 2011-01-16 DIAGNOSIS — R0989 Other specified symptoms and signs involving the circulatory and respiratory systems: Secondary | ICD-10-CM

## 2011-01-16 LAB — POCT INR: INR: 2.6

## 2011-01-16 NOTE — Procedures (Signed)
NAME:  Mark [name], Mark [name] NO.:  [id number]   MEDICAL RECORD NO.:  [id number]          PATIENT TYPE:  OUT   LOCATION:  SLEEP CENTER                 FACILITY:  [facility]   PHYSICIAN:  Marcelyn Bruins, M.D. [facility] DATE OF BIRTH:  [birth year]   DATE OF STUDY:  06/29/2005                              NOCTURNAL POLYSOMNOGRAM   REFERRING PHYSICIAN:  Dr. Marcelyn Bruins.   DATE OF STUDY:  June 27, 2005.   INDICATION FOR STUDY:  Hypersomnia with sleep apnea.   EPWORTH SCORE:  16.   SLEEP ARCHITECTURE:  The patient total sleep time of 348 minutes with  decreased REM and slow wave sleep. Sleep onset latency was prolonged at 41  minutes and REM onset was at the upper limits of normal. Sleep efficiency  was 88%.   RESPIRATORY DATA:  The patient was found to have 39 hypopneas and 6  obstructive apneas for a respiratory disturbance index of 8 events per hour.  These were primarily during REM. There was moderate to loud snoring noted.  The events were not positional.   OXYGEN DATA:  The patient had O2 desaturation as low as 86% with the  obstructive events.   CARDIAC DATA:  The patient had no clinically significant cardiac  arrhythmias.   MOVEMENT/PARASOMNIA:  Small numbers of leg jerks were noted with very little  sleep disruption.   IMPRESSION:  Mild obstructive sleep apnea/hypopnea syndrome with O2  desaturation as low as 86%. The events did occur primarily during REM and  therefore may make the patient more symptomatic given this degree of sleep  apnea. Treatment for this may involve weight loss alone, upper airway  surgery, oral appliance, and also C-PAP. Clinical correlation is suggested.           ______________________________  Marcelyn Bruins, M.D. [facility]  Diplomate, American Board of Sleep  Medicine     KC/MEDQ  D:  07/01/2005 16:44:33  T:  07/01/2005 23:49:42  Job:  [id number]

## 2011-01-16 NOTE — Assessment & Plan Note (Signed)
[facility]CARE                              CARDIOLOGY OFFICE NOTE   ALESANDRO, STUEVE                MRN:          [id number]  DATE:06/07/2006                            DOB:          16-Aug-1961    PRIMARY CARE PHYSICIAN:  Titus Dubin. Alwyn Ren, M.D.   PATIENT IDENTIFICATION:  Mark [name] is a 50 year old male with a  history of aortic valve replacement due to endocarditis who was previously  followed by Dr. Tenny Craw and presents to establish long-term care here.   PROBLEM LIST:  1. History of aortic valve endocarditis, status post St. Jude's aortic      valve placement in 1984.  2. Hypertension.  3. Hyperlipidemia.  4. Obstructive sleep apnea, unable to tolerate CPAP.  5. Kidney stones.  6. Erectile dysfunction.  7. Anxiety.   CURRENT MEDICATIONS:  1. Coumadin as directed.  2. Diovan 80 mg a day.   DRUG INTOLERANCES/ALLERGIES:  He has been unable to tolerate LIPITOR due to  severe muscle cramps.  Also apparently had problems with VYTORIN.   INTERVAL HISTORY:  Mr. Gaspard returns today to establish long-term  care.  He previously saw Dr. Tenny Craw.  Overall, he is doing quite well.  He  denies any chest pain or shortness of breath.  He remains fairly active on  his own but he is not exercising.  He denies any lower extremity edema.  He  has not had any problems with bleeding from his Coumadin, though his INR has  been quite labile.  Unfortunately, he has had a difficult time tolerating  the CPAP, has not been able to wear it.  His wife says he snores frequently  and keeps her up.  He is working hard with his diet to lose weight and has  lost over 30 pounds.   PHYSICAL EXAMINATION:  He is well-appearing, no acute distress.  He  ambulates around the clinic briskly with no difficulty.  Blood pressure is  126/82.  His heart rate is 64.  He weighs 203 pounds.  HEENT:  Sclerae anicteric.  EOMI.  There is no xanthelasma.  Moist mucus  membranes.  Neck is supple.  No JVD.  Carotids are 2+ bilaterally and  bruits.  There is no lymphadenopathy or thyromegaly.  CARDIAC:  Regular rate and rhythm with a mechanical S2.  His valve sounds  are crisp.  There is no murmur associated with it.  There is a soft apical  murmur.  LUNGS:  Clear.  ABDOMEN:  Soft, nontender, and nondistended, mildly obese.  No  hepatosplenomegaly, no bruits, no masses.  EXTREMITIES:  Warm with no clubbing, cyanosis, or edema.  NEUROLOGICAL:  Alert and oriented x3.  Speech is must minimally pressured.  Cranial nerves II through XII are intact and moves all four extremities  without difficulty.   ASSESSMENT/PLAN:  1. Status post aortic valve replacement:  He is doing quite well.  We will      follow him with yearly echocardiogram.  He is aware of need for      endocarditis prophylaxis with invasive procedures.  2. hyperlipidemia:  Lipids are quite high with  previous LDL of 219 in      April of 2006.  I have reinforced the need to be compliant with his      diet and also told him about the need for a statin.  We will try a      Pravastatin 20 mg a day to see how he tolerates.  3. Hypertension:  This is well controlled.  4. Sleep apnea:  I have referred him back to Dr. Shelle Iron to see if he can      find a device that works better for him.   DISPOSITION:  I will see him back for routine follow-up in six months.       Bevelyn Buckles. Bensimhon, MD     DRB/MedQ  DD:  06/07/2006  DT:  06/08/2006  Job #:  [id number]   cc:   Titus Dubin. Alwyn Ren, MD,FACP,FCCP

## 2011-01-16 NOTE — Discharge Summary (Signed)
   NAME:  Mark [name], Mark [name]                 ACCOUNT NO.:  [id number]   MEDICAL RECORD NO.:  [id number]                   PATIENT TYPE:  INP   LOCATION:  5009                                 FACILITY:  [facility]   PHYSICIAN:  Coletta Memos, M.D.                  DATE OF BIRTH:  05/06/1961   DATE OF ADMISSION:  07/05/2002  DATE OF DISCHARGE:  07/09/2002                                 DISCHARGE SUMMARY   ADMISSION DIAGNOSES:  1. L5-S1 spondylolisthesis.  2. Spondylolysis.  3. Low back pain.   DISCHARGE DIAGNOSES:  1. L5-S1 spondylolisthesis.  2. Spondylolysis.  3. Low back pain.   PROCEDURES:  Posterior lumbar interbody fusion and posterolateral  arthrodesis, L5-S1.   COMPLICATIONS:  None.   INDICATIONS:  The patient is a 50 year old gentleman who has a  spondylolisthesis and par defect at L5-S1.  He had intractable low back  pain.  Therefore, I recommended after conservative measures were tried to  undergo lumbar fusion.   HOSPITAL COURSE:  The patient also has an artificial heart valve and, as a  result, he is on Coumadin therapy.  We have already arranged for him to be  treated with Lovenox and then Coumadin for the aortic valve.  He was started  on Lovenox on 07/07/2002 and also given his first dose of Coumadin.  He will  be sent home with the Lovenox and that was taken care of through [facility]  coagulation clinic.  Strength was 5/5 at discharge. Wound clean and dry  without signs of infection, tolerating a regular diet.   He was given instructions of no driving for two weeks, no bending or  lifting.  He is to contact my office for a followup appointment.  His pain  is improved at the time of discharge.                                                    Coletta Memos, M.D.    KC/MEDQ  D:  07/07/2002  T:  07/09/2002  Job:  [id number]

## 2011-01-16 NOTE — Op Note (Signed)
NAME:  Mark [name], Mark [name]                 ACCOUNT NO.:  [id number]   MEDICAL RECORD NO.:  [id number]                   PATIENT TYPE:  INP   LOCATION:  5009                                 FACILITY:  [facility]   PHYSICIAN:  Coletta Memos, M.D.                  DATE OF BIRTH:  02-Aug-1961   DATE OF PROCEDURE:  DATE OF DISCHARGE:                                 OPERATIVE REPORT   PREOPERATIVE. DIAGNOSIS:  Spondylolisthesis, spondylolysis at L5-S1.   POSTOPERATIVE DIAGNOSIS:  Spondylolisthesis, spondylolysis at L5-S1.   OPERATION PERFORMED:  Gill procedure L5-S1, posterolateral arthrodesis, L5-  S1 nonsegmental pedicle screw fixation using Medtronic transforaminal  interbody fusion Boomerang 13 mm x 30 mm and morselized allograft for  interbody arthrodesis.   COMPLICATIONS:  None.   SURGEON:  Coletta Memos, M.D.   ASSISTANT:  Hewitt Shorts, M.D.   ANESTHESIA:   INDICATIONS FOR PROCEDURE:  The patient is a 50 year old with intractable  back pain, spondylolisthesis and spondylolysis at L5-S1.  I recommended and  he has agreed to undergo lumbar decompression and fusion with the Gill  procedure.   DESCRIPTION OF PROCEDURE:  The patient was brought to the operating room,  intubated and placed under general anesthesia without difficulty.  He was  placed into the prone position, all pressure points properly padded.  A  Foley catheter was then placed under sterile conditions without difficulty.  Skin incision was made and I took this down to the thoracolumbar fascia.  I  identified and exposed the lamina of L3, L5 and S1.  I then performed a Gill  procedure and removed the lamina and pseudoarthrosis bilaterally at L5-S1.  Once that was done, the thecal sac was well exposed and the L5 and S1 nerve  roots.  I then placed distraction devices into the disk space after opening  it and removed this in a progressive fashion.  I then was able to place for  fluoroscopic guidance a 13 x 30  boomerang device packed with bone putty and  allograft into the disk space.  Bone was packed anteriorly to the boomerang  and then bone was also packed laterally and anterior to it.  I then placed  pedicle screws using fluoroscopic guidance with the assistance of Dr. Shirlean Kelly who also assisted with the interbody fusion.  Two screws were  placed into S1, two screws were placed in  L5.  X-rays showed the bottom  right screw in the sacrum was oriented mildly laterally; however, with  inspection, there was good bone surrounding this screw in all positions.  Each hole was first inspected and then tapped and inspected again with a  pedicle probe, then the screw was placed.  He tolerated the procedure  without difficulty.  Rods were placed. More bone was placed into the  interspace.  Irrigated the wound.  I then closed the wound in layered  fashion after securing the rods with facet screws breaking  them off as  supposed to.  There were no difficulties encountered.  The patient tolerated  the procedure well.                                              Coletta Memos, M.D.   KC/MEDQ  D:  07/05/2002  T:  07/06/2002  Job:  [id number]

## 2011-01-16 NOTE — Discharge Summary (Signed)
   NAME:  Mark [name], Mark [name]                 ACCOUNT NO.:  [id number]   MEDICAL RECORD NO.:  [id number]                   PATIENT TYPE:  INP   LOCATION:  5009                                 FACILITY:  [facility]   PHYSICIAN:  Hewitt Shorts, M.D.            DATE OF BIRTH:  April 25, 1961   DATE OF ADMISSION:  07/05/2002  DATE OF DISCHARGE:  07/09/2002                                 DISCHARGE SUMMARY   ADMISSION HISTORY AND PHYSICAL EXAMINATION:  The patient is a 50 year old  man who was evaluated by Dr. Freddrick March for an L5-S1 spondylolisthesis and  spondylolysis.  Details of his admission history and physical examination  are included in Dr. Rafael Bihari admission note.   HOSPITAL COURSE:  The patient was admitted by Dr. Freddrick March and underwent an  L5 Gill procedure and L5-S1 posterior lumbar interbody fusion and posterior  lateral arthrodesis.  He has done well following surgery.  He was seen by  physical therapy and subsequently by occupational  therapy.  He has made  good progress and is steadily ambulating further and further.  He is using a  rolling walker.  The patient is asking to be discharged at this time.  His  wound is healing well.  He is afebrile.  We have requested that he be  provided with a rolling walker with 5-inch heels, three-in-one and that  arrangements be made for home health  physical therapy and occupational  therapy.  Discharge prescriptions were given by Dr. Freddrick March both Percocet  and Flexeril.   DISCHARGE DIAGNOSES:  1. Spondylolisthesis.  2. Spondylolysis.                                               Hewitt Shorts, M.D.    RWN/MEDQ  D:  07/09/2002  T:  07/10/2002  Job:  [id number]

## 2011-02-06 ENCOUNTER — Other Ambulatory Visit: Payer: Self-pay | Admitting: Internal Medicine

## 2011-02-06 ENCOUNTER — Ambulatory Visit (INDEPENDENT_AMBULATORY_CARE_PROVIDER_SITE_OTHER): Payer: Self-pay | Admitting: *Deleted

## 2011-02-06 ENCOUNTER — Other Ambulatory Visit (INDEPENDENT_AMBULATORY_CARE_PROVIDER_SITE_OTHER): Payer: Self-pay | Admitting: *Deleted

## 2011-02-06 DIAGNOSIS — I359 Nonrheumatic aortic valve disorder, unspecified: Secondary | ICD-10-CM

## 2011-02-06 DIAGNOSIS — Z954 Presence of other heart-valve replacement: Secondary | ICD-10-CM

## 2011-02-06 DIAGNOSIS — Z952 Presence of prosthetic heart valve: Secondary | ICD-10-CM

## 2011-02-06 DIAGNOSIS — E785 Hyperlipidemia, unspecified: Secondary | ICD-10-CM

## 2011-02-06 DIAGNOSIS — I4891 Unspecified atrial fibrillation: Secondary | ICD-10-CM

## 2011-02-06 LAB — HEPATIC FUNCTION PANEL
ALT: 29 U/L (ref 0–53)
AST: 23 U/L (ref 0–37)
Albumin: 4.8 g/dL (ref 3.5–5.2)
Alkaline Phosphatase: 69 U/L (ref 39–117)
Bilirubin, Direct: 0.3 mg/dL (ref 0.0–0.3)
Total Bilirubin: 2.2 mg/dL — ABNORMAL HIGH (ref 0.3–1.2)
Total Protein: 7.7 g/dL (ref 6.0–8.3)

## 2011-02-06 LAB — LDL CHOLESTEROL, DIRECT: Direct LDL: 170.9 mg/dL

## 2011-02-06 LAB — LIPID PANEL
Cholesterol: 242 mg/dL — ABNORMAL HIGH (ref 0–200)
HDL: 41 mg/dL (ref >39.00)
Total CHOL/HDL Ratio: 6
Triglycerides: 155 mg/dL — ABNORMAL HIGH (ref 0.0–149.0)
VLDL: 31 mg/dL (ref 0.0–40.0)

## 2011-02-06 LAB — POCT INR: INR: 1.6

## 2011-02-19 ENCOUNTER — Encounter: Payer: Self-pay | Admitting: *Deleted

## 2011-02-25 ENCOUNTER — Telehealth: Payer: Self-pay | Admitting: *Deleted

## 2011-02-25 MED ORDER — ATORVASTATIN CALCIUM 40 MG PO TABS: 40.0000 mg | ORAL_TABLET | Freq: Every day | ORAL | Status: DC

## 2011-02-25 NOTE — Telephone Encounter (Signed)
Message copied by Noralee Space on Wed Feb 25, 2011  4:03 PM ------      Message from: Dolores Patty      Created: Tue Feb 10, 2011 11:44 PM       Cholesterol way up. Is he taking his simva? If so change to atorva 40 and repeat in 3 months

## 2011-02-25 NOTE — Telephone Encounter (Signed)
Pt on simva changed to atorvastatin will recheck labs in 3 months.

## 2011-04-24 ENCOUNTER — Ambulatory Visit (INDEPENDENT_AMBULATORY_CARE_PROVIDER_SITE_OTHER): Payer: Self-pay | Admitting: *Deleted

## 2011-04-24 DIAGNOSIS — I4891 Unspecified atrial fibrillation: Secondary | ICD-10-CM

## 2011-04-24 DIAGNOSIS — Z952 Presence of prosthetic heart valve: Secondary | ICD-10-CM

## 2011-04-24 DIAGNOSIS — Z954 Presence of other heart-valve replacement: Secondary | ICD-10-CM

## 2011-04-24 LAB — POCT INR: INR: 4.6

## 2011-04-29 ENCOUNTER — Telehealth: Payer: Self-pay | Admitting: Internal Medicine

## 2011-04-29 DIAGNOSIS — E785 Hyperlipidemia, unspecified: Secondary | ICD-10-CM

## 2011-04-29 NOTE — Telephone Encounter (Signed)
Pt calling asking that she return pt call regarding pt Lipitor. Please return pt call.

## 2011-04-29 NOTE — Telephone Encounter (Signed)
Try crestor 40

## 2011-04-29 NOTE — Telephone Encounter (Signed)
Spoke w/pt he started Lipitor in June and has been having horrible cramps ever since in feet and shoulders, cvrr told him to hold it on Fri and he has been ok since then, will send to Dr Gala Romney for review, pt was previously on Simva 40 mg daily.

## 2011-04-30 NOTE — Telephone Encounter (Signed)
Pt states he has tried Crestor before and was unable to tolerate due to leg cramps, will let Dr Gala Romney know

## 2011-05-04 NOTE — Telephone Encounter (Signed)
Let's refer to lipid clinic.

## 2011-05-06 NOTE — Telephone Encounter (Signed)
Addended by: Noralee Space on: 05/06/2011 05:13 PM   Modules accepted: Orders

## 2011-05-06 NOTE — Telephone Encounter (Signed)
Pt aware and agreeable, Mark [name] will call him to schedule appt

## 2011-05-13 ENCOUNTER — Other Ambulatory Visit: Payer: Self-pay | Admitting: *Deleted

## 2011-05-13 MED ORDER — LISINOPRIL 20 MG PO TABS: 20.0000 mg | ORAL_TABLET | Freq: Every day | ORAL | Status: DC

## 2011-05-21 ENCOUNTER — Ambulatory Visit (INDEPENDENT_AMBULATORY_CARE_PROVIDER_SITE_OTHER): Payer: Self-pay | Admitting: *Deleted

## 2011-05-21 ENCOUNTER — Ambulatory Visit (INDEPENDENT_AMBULATORY_CARE_PROVIDER_SITE_OTHER): Payer: Self-pay

## 2011-05-21 DIAGNOSIS — I4891 Unspecified atrial fibrillation: Secondary | ICD-10-CM

## 2011-05-21 DIAGNOSIS — E785 Hyperlipidemia, unspecified: Secondary | ICD-10-CM

## 2011-05-21 DIAGNOSIS — Z952 Presence of prosthetic heart valve: Secondary | ICD-10-CM

## 2011-05-21 LAB — POCT INR: INR: 5.8

## 2011-05-21 MED ORDER — SIMVASTATIN 40 MG PO TABS: 40.0000 mg | ORAL_TABLET | Freq: Every day | ORAL | Status: DC

## 2011-05-21 NOTE — Assessment & Plan Note (Signed)
CV Risk Assessment Risk Factors: Family hx of high cholesterol (no FHx of CVD), HTN, sedentary lifestyle, poor eating habits LDL Goal<130 (optimal <100), HDL Goal>40, TG Goal<150  Recommendations & Plan 1. Cut out biscuits for breakfast, try to find a healthy alternative (ie. Oatmeal, fruit) 2. Try to eat something for lunch and have healthy snacks during day 3. Cut back on portion size at dinner (use new square dinner plates) 4. Increase exercise to of biking each day 5. Restart simvastatin 40mg  daily 6. Return for f/u lab and visit in one month.

## 2011-05-21 NOTE — Progress Notes (Signed)
HPI Mr Stocks is a very pleasant gentleman, known to [place] in anticoag clinic, who presents today for his first visit with lipid clinic.  He has been treated in the past with Crestor (leg cramping), simvastatin (tolerated, but didn't help much), and most recently Lipitor (leg cramping).  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   Diet - He admits that his diet is poorly controlled.  His wife recently bought him diet guides/books that have made him more aware of his  poor eating habits.  For breakfast, he usually has an egg and cheese or steak buiscuit and root beer.  He seldom eats lunch, but snacks on  potato chips and pb crackers.  His biggest meal is dinner, which consists of chicken/beef and steamed vegetables.  He rarely has desserts  or sweets.   Exercise - He recently bought a bike and has started riding around the neighborhood: 2-3 miles/night for 15 minutes x2 weeks  Lipid Panel  Total Cholesterol 242  02/06/2011 TG   155  02/06/2011 HDL   41  02/06/2011  LDL-Direct  170  02/06/2011  Current Outpatient Prescriptions  Medication Sig Dispense Refill  . aspirin 81 MG tablet Take 81 mg by mouth daily.        Marland Kitchen lisinopril (PRINIVIL,ZESTRIL) 20 MG tablet Take 1 tablet (20 mg total) by mouth daily.  30 tablet  7  . omeprazole (PRILOSEC) 20 MG capsule Take 20 mg by mouth daily.        Marland Kitchen warfarin (COUMADIN) 10 MG tablet Take 1 tablet (10 mg total) by mouth as directed.  45 tablet  3  . amoxicillin (AMOXIL) 500 MG capsule Take 4 tabs 1 hour prior to dental procedures       . simvastatin (ZOCOR) 40 MG tablet Take 1 tablet (40 mg total) by mouth at bedtime.  90 tablet  3

## 2011-05-21 NOTE — Patient Instructions (Signed)
Restart simvastatin 40mg  daily  Diet Plans:  1. Stop eating biscuits for breakfast.  Try to eat oatmeal, fruit, or egg whites 2. Eat something for lunch every day.  3. Eat off a smaller plate at dinner.   Exercise:  1. Try to increase your bike riding to 30 minutes a day.   Follow up in one month.

## 2011-05-22 ENCOUNTER — Encounter: Payer: Self-pay | Admitting: *Deleted

## 2011-05-27 LAB — POCT I-STAT 3, VENOUS BLOOD GAS (G3P V)
Acid-Base Excess: 1
Bicarbonate: 27 — ABNORMAL HIGH
Bicarbonate: 27.6 — ABNORMAL HIGH
O2 Saturation: 73
O2 Saturation: 73
Operator id: 221371
Operator id: 221371
TCO2: 29
TCO2: 29
pCO2, Ven: 52.5 — ABNORMAL HIGH
pCO2, Ven: 53.4 — ABNORMAL HIGH
pH, Ven: 7.312 — ABNORMAL HIGH
pH, Ven: 7.329 — ABNORMAL HIGH
pO2, Ven: 42
pO2, Ven: 42

## 2011-05-27 LAB — POCT I-STAT 3, ART BLOOD GAS (G3+)
Acid-Base Excess: 2
Bicarbonate: 27.8 — ABNORMAL HIGH
O2 Saturation: 98
Operator id: 221371
TCO2: 29
pCO2 arterial: 46.7 — ABNORMAL HIGH
pH, Arterial: 7.383
pO2, Arterial: 113 — ABNORMAL HIGH

## 2011-05-28 LAB — POCT CARDIAC MARKERS
CKMB, poc: 2.5
Myoglobin, poc: 97
Operator id: 222501
Troponin i, poc: <0.05

## 2011-05-28 LAB — POCT I-STAT, CHEM 8
BUN: 12
Calcium, Ion: 1.2
Chloride: 106
Creatinine, Ser: 1.1
Glucose, Bld: 105 — ABNORMAL HIGH
HCT: 37 — ABNORMAL LOW
Hemoglobin: 12.6 — ABNORMAL LOW
Potassium: 4.3
Sodium: 141
TCO2: 27

## 2011-05-28 LAB — PROTIME-INR
INR: 3.8 — ABNORMAL HIGH
Prothrombin Time: 38.7 — ABNORMAL HIGH

## 2011-05-28 LAB — DIFFERENTIAL
Basophils Absolute: 0
Basophils Relative: 1
Eosinophils Absolute: 0
Eosinophils Relative: 1
Lymphocytes Relative: 17
Lymphs Abs: 1.1
Monocytes Absolute: 0.4
Monocytes Relative: 7
Neutro Abs: 4.9
Neutrophils Relative %: 75

## 2011-05-28 LAB — CBC
HCT: 37.5 — ABNORMAL LOW
Hemoglobin: 12.9 — ABNORMAL LOW
MCHC: 34.4
MCV: 89.4
Platelets: 150
RBC: 4.2 — ABNORMAL LOW
RDW: 13.1
WBC: 6.5

## 2011-06-05 ENCOUNTER — Encounter: Payer: Self-pay | Admitting: *Deleted

## 2011-06-10 ENCOUNTER — Ambulatory Visit (INDEPENDENT_AMBULATORY_CARE_PROVIDER_SITE_OTHER): Payer: Self-pay | Admitting: *Deleted

## 2011-06-10 DIAGNOSIS — Z952 Presence of prosthetic heart valve: Secondary | ICD-10-CM

## 2011-06-10 DIAGNOSIS — I4891 Unspecified atrial fibrillation: Secondary | ICD-10-CM

## 2011-06-10 LAB — POCT INR: INR: 3.5

## 2011-06-17 ENCOUNTER — Other Ambulatory Visit: Payer: Self-pay | Admitting: Thoracic Surgery (Cardiothoracic Vascular Surgery)

## 2011-06-17 DIAGNOSIS — I712 Thoracic aortic aneurysm, without rupture: Secondary | ICD-10-CM

## 2011-06-23 ENCOUNTER — Other Ambulatory Visit: Payer: Self-pay | Admitting: *Deleted

## 2011-06-25 ENCOUNTER — Ambulatory Visit: Payer: Self-pay

## 2011-06-25 ENCOUNTER — Other Ambulatory Visit (INDEPENDENT_AMBULATORY_CARE_PROVIDER_SITE_OTHER): Payer: Self-pay | Admitting: *Deleted

## 2011-06-25 ENCOUNTER — Ambulatory Visit (INDEPENDENT_AMBULATORY_CARE_PROVIDER_SITE_OTHER): Payer: Self-pay | Admitting: *Deleted

## 2011-06-25 DIAGNOSIS — Z952 Presence of prosthetic heart valve: Secondary | ICD-10-CM

## 2011-06-25 DIAGNOSIS — E785 Hyperlipidemia, unspecified: Secondary | ICD-10-CM

## 2011-06-25 DIAGNOSIS — I4891 Unspecified atrial fibrillation: Secondary | ICD-10-CM

## 2011-06-25 DIAGNOSIS — Z7901 Long term (current) use of anticoagulants: Secondary | ICD-10-CM

## 2011-06-25 LAB — HEPATIC FUNCTION PANEL
ALT: 26 U/L (ref 0–53)
AST: 21 U/L (ref 0–37)
Albumin: 4.2 g/dL (ref 3.5–5.2)
Alkaline Phosphatase: 65 U/L (ref 39–117)
Bilirubin, Direct: 0.2 mg/dL (ref 0.0–0.3)
Total Bilirubin: 1.5 mg/dL — ABNORMAL HIGH (ref 0.3–1.2)
Total Protein: 7 g/dL (ref 6.0–8.3)

## 2011-06-25 LAB — LIPID PANEL
Cholesterol: 157 mg/dL (ref 0–200)
HDL: 37.7 mg/dL — ABNORMAL LOW (ref >39.00)
LDL Cholesterol: 98 mg/dL (ref 0–99)
Total CHOL/HDL Ratio: 4
Triglycerides: 105 mg/dL (ref 0.0–149.0)
VLDL: 21 mg/dL (ref 0.0–40.0)

## 2011-06-25 LAB — POCT INR: INR: 3.6

## 2011-07-01 ENCOUNTER — Encounter (HOSPITAL_COMMUNITY): Payer: Self-pay | Admitting: *Deleted

## 2011-07-08 ENCOUNTER — Other Ambulatory Visit: Payer: Self-pay | Admitting: *Deleted

## 2011-07-08 MED ORDER — WARFARIN SODIUM 10 MG PO TABS: 10.0000 mg | ORAL_TABLET | ORAL | Status: DC

## 2011-07-09 ENCOUNTER — Telehealth: Payer: Self-pay | Admitting: Internal Medicine

## 2011-07-09 NOTE — Telephone Encounter (Signed)
Pt is having a test done and he is not sure what it is and he has some questions and concerns

## 2011-07-09 NOTE — Telephone Encounter (Signed)
Pt states he is supposed to have an echo every 6 months.  He states his last echo was 12/19/10.  He wants to know if he needs to have another one done now?  He doesn't have one scheduled that I can see or that he knows of.

## 2011-07-10 NOTE — Telephone Encounter (Signed)
Would do yearly echos at this point unless he is more symptomatic.

## 2011-07-16 ENCOUNTER — Ambulatory Visit (INDEPENDENT_AMBULATORY_CARE_PROVIDER_SITE_OTHER): Payer: Self-pay | Admitting: *Deleted

## 2011-07-16 DIAGNOSIS — I4891 Unspecified atrial fibrillation: Secondary | ICD-10-CM

## 2011-07-16 DIAGNOSIS — Z7901 Long term (current) use of anticoagulants: Secondary | ICD-10-CM

## 2011-07-16 DIAGNOSIS — Z952 Presence of prosthetic heart valve: Secondary | ICD-10-CM

## 2011-07-16 LAB — POCT INR: INR: 3.8

## 2011-07-27 ENCOUNTER — Ambulatory Visit: Payer: Self-pay | Admitting: Thoracic Surgery (Cardiothoracic Vascular Surgery)

## 2011-07-27 ENCOUNTER — Other Ambulatory Visit: Payer: Self-pay

## 2011-08-03 ENCOUNTER — Ambulatory Visit: Payer: Self-pay | Admitting: Thoracic Surgery (Cardiothoracic Vascular Surgery)

## 2011-08-03 ENCOUNTER — Ambulatory Visit
Admission: RE | Admit: 2011-08-03 | Discharge: 2011-08-03 | Disposition: A | Discharge status: Home / Self Care | Payer: No Typology Code available for payment source | Source: Ambulatory Visit | Attending: Thoracic Surgery (Cardiothoracic Vascular Surgery) | Admitting: Thoracic Surgery (Cardiothoracic Vascular Surgery)

## 2011-08-03 DIAGNOSIS — I712 Thoracic aortic aneurysm, without rupture: Secondary | ICD-10-CM

## 2011-08-03 MED ORDER — IOHEXOL 300 MG/ML  SOLN
100.0000 mL | Freq: Once | INTRAMUSCULAR | Status: AC | PRN
  Administered 2011-08-03: 100 mL via INTRAVENOUS

## 2011-08-13 ENCOUNTER — Ambulatory Visit (INDEPENDENT_AMBULATORY_CARE_PROVIDER_SITE_OTHER): Payer: Self-pay | Admitting: Family Medicine

## 2011-08-13 ENCOUNTER — Ambulatory Visit (INDEPENDENT_AMBULATORY_CARE_PROVIDER_SITE_OTHER): Payer: Self-pay | Admitting: *Deleted

## 2011-08-13 ENCOUNTER — Telehealth: Payer: Self-pay | Admitting: Internal Medicine

## 2011-08-13 ENCOUNTER — Encounter: Payer: Self-pay | Admitting: Family Medicine

## 2011-08-13 VITALS — BP 115/75 | HR 71 | Temp 97.5°F | Ht 70.0 in | Wt 232.0 lb

## 2011-08-13 DIAGNOSIS — Z7901 Long term (current) use of anticoagulants: Secondary | ICD-10-CM

## 2011-08-13 DIAGNOSIS — Z952 Presence of prosthetic heart valve: Secondary | ICD-10-CM

## 2011-08-13 DIAGNOSIS — Z23 Encounter for immunization: Secondary | ICD-10-CM

## 2011-08-13 DIAGNOSIS — E785 Hyperlipidemia, unspecified: Secondary | ICD-10-CM

## 2011-08-13 DIAGNOSIS — L089 Local infection of the skin and subcutaneous tissue, unspecified: Secondary | ICD-10-CM

## 2011-08-13 DIAGNOSIS — I4891 Unspecified atrial fibrillation: Secondary | ICD-10-CM

## 2011-08-13 LAB — POCT INR: INR: 4

## 2011-08-13 MED ORDER — DOXYCYCLINE HYCLATE 100 MG PO TABS: 100.0000 mg | ORAL_TABLET | Freq: Two times a day (BID) | ORAL | Status: AC

## 2011-08-13 NOTE — Patient Instructions (Signed)
Take the Doxy as directed- take w/ food to avoid upset stomach If you have spreading redness, increased pus or drainage, or any other concerns- CALL! Hang in there! Good luck to your wife (i'm so sorry) Happy Holidays!

## 2011-08-13 NOTE — Telephone Encounter (Signed)
Spoke with pt.  Advised okay to take clindamycin with Coumadin.  Instructed to call if any diarrhea, etc occurs as this may change INR.

## 2011-08-13 NOTE — Progress Notes (Signed)
  Subjective:    Patient ID: Mark [name], male    DOB: 17-Jan-1961, 50 y.o.   MRN: [id number]  HPI R finger injury- cut knuckle of 3rd finger 1 week ago on galvanized pipe.  Cleaned area immediately.  Did not start swelling until Monday.  Now red, swollen, painful.  Needs tetanus.   Review of Systems For ROS see HPI     Objective:   Physical Exam  Vitals reviewed. Constitutional: He appears well-developed and well-nourished. No distress.  Skin: There is erythema (1.5 cm well approximated laceration to PIP joint of R 3rd finger.  + sweling and erythema.  + TTP).          Assessment & Plan:

## 2011-08-13 NOTE — Telephone Encounter (Signed)
New message:  Pt was started on Clindomycin by his PCP.  He just wanted you to know. Please advise him if any necessary changes.

## 2011-08-14 ENCOUNTER — Telehealth: Payer: Self-pay | Admitting: *Deleted

## 2011-08-14 NOTE — Telephone Encounter (Signed)
ABX that pt is on is not Clindamycin it is Doxycycline 100mg s BID for 10 days that he is starting today, 08/14/11. Follow up appt scheduled for Tuesday.

## 2011-08-18 NOTE — Assessment & Plan Note (Signed)
Pt w/ cellulitis.  Start Doxy to cover for possible MRSA given he has prosthetic valve.  Tdap given.  Reviewed supportive care and red flags that should prompt return.  Pt expressed understanding and is in agreement w/ plan.

## 2011-08-19 ENCOUNTER — Ambulatory Visit (INDEPENDENT_AMBULATORY_CARE_PROVIDER_SITE_OTHER): Payer: Self-pay | Admitting: *Deleted

## 2011-08-19 DIAGNOSIS — Z952 Presence of prosthetic heart valve: Secondary | ICD-10-CM

## 2011-08-19 DIAGNOSIS — I4891 Unspecified atrial fibrillation: Secondary | ICD-10-CM

## 2011-08-19 DIAGNOSIS — Z7901 Long term (current) use of anticoagulants: Secondary | ICD-10-CM

## 2011-08-19 LAB — POCT INR: INR: 2.1

## 2011-08-31 ENCOUNTER — Ambulatory Visit (INDEPENDENT_AMBULATORY_CARE_PROVIDER_SITE_OTHER): Payer: Self-pay | Admitting: *Deleted

## 2011-08-31 DIAGNOSIS — Z952 Presence of prosthetic heart valve: Secondary | ICD-10-CM

## 2011-08-31 DIAGNOSIS — Z7901 Long term (current) use of anticoagulants: Secondary | ICD-10-CM

## 2011-08-31 DIAGNOSIS — I4891 Unspecified atrial fibrillation: Secondary | ICD-10-CM

## 2011-08-31 LAB — POCT INR: INR: 4.6

## 2011-09-04 ENCOUNTER — Other Ambulatory Visit: Payer: Self-pay

## 2011-09-04 ENCOUNTER — Emergency Department (HOSPITAL_COMMUNITY)
Admission: EM | Admit: 2011-09-04 | Discharge: 2011-09-04 | Disposition: A | Discharge status: Home / Self Care | Payer: Self-pay | Attending: Emergency Medicine | Admitting: Emergency Medicine

## 2011-09-04 DIAGNOSIS — Z981 Arthrodesis status: Secondary | ICD-10-CM | POA: Insufficient documentation

## 2011-09-04 DIAGNOSIS — Z7982 Long term (current) use of aspirin: Secondary | ICD-10-CM | POA: Insufficient documentation

## 2011-09-04 DIAGNOSIS — Z7901 Long term (current) use of anticoagulants: Secondary | ICD-10-CM | POA: Insufficient documentation

## 2011-09-04 DIAGNOSIS — R002 Palpitations: Secondary | ICD-10-CM | POA: Insufficient documentation

## 2011-09-04 DIAGNOSIS — Z954 Presence of other heart-valve replacement: Secondary | ICD-10-CM | POA: Insufficient documentation

## 2011-09-04 DIAGNOSIS — R51 Headache: Secondary | ICD-10-CM | POA: Insufficient documentation

## 2011-09-04 DIAGNOSIS — Z79899 Other long term (current) drug therapy: Secondary | ICD-10-CM | POA: Insufficient documentation

## 2011-09-04 DIAGNOSIS — I4891 Unspecified atrial fibrillation: Secondary | ICD-10-CM | POA: Insufficient documentation

## 2011-09-04 LAB — POCT I-STAT, CHEM 8
BUN: 21 mg/dL (ref 6–23)
Calcium, Ion: 1.16 mmol/L (ref 1.12–1.32)
Chloride: 107 mEq/L (ref 96–112)
Creatinine, Ser: 1.3 mg/dL (ref 0.50–1.35)
Glucose, Bld: 115 mg/dL — ABNORMAL HIGH (ref 70–99)
HCT: 45 % (ref 39.0–52.0)
Hemoglobin: 15.3 g/dL (ref 13.0–17.0)
Potassium: 4 mEq/L (ref 3.5–5.1)
Sodium: 142 mEq/L (ref 135–145)
TCO2: 24 mmol/L (ref 0–100)

## 2011-09-04 LAB — PROTIME-INR
INR: 3.24 — ABNORMAL HIGH (ref 0.00–1.49)
Prothrombin Time: 33.6 seconds — ABNORMAL HIGH (ref 11.6–15.2)

## 2011-09-04 MED ORDER — ONDANSETRON HCL 4 MG/2ML IJ SOLN
4.0000 mg | Freq: Once | INTRAMUSCULAR | Status: AC
  Administered 2011-09-04: 4 mg via INTRAVENOUS
  Filled 2011-09-04: qty 2

## 2011-09-04 MED ORDER — METOPROLOL SUCCINATE ER 25 MG PO TB24: 25.0000 mg | ORAL_TABLET | Freq: Every day | ORAL | Status: DC

## 2011-09-04 MED ORDER — HYDROMORPHONE HCL PF 1 MG/ML IJ SOLN
0.5000 mg | Freq: Once | INTRAMUSCULAR | Status: AC
  Administered 2011-09-04: 0.5 mg via INTRAVENOUS
  Filled 2011-09-04: qty 1

## 2011-09-04 MED ORDER — METOPROLOL TARTRATE 25 MG PO TABS
25.0000 mg | ORAL_TABLET | Freq: Once | ORAL | Status: AC
  Administered 2011-09-04: 25 mg via ORAL
  Filled 2011-09-04: qty 1

## 2011-09-04 NOTE — ED Notes (Signed)
Pt reports he has been going in and out of afib more recently.  Pt states he can usually breath it away.   Pt states he has been getting headaches more frequently too.

## 2011-09-04 NOTE — ED Provider Notes (Signed)
History     CSN: [id number]  Arrival date & time 09/04/11  1722   First MD Initiated Contact with Patient 09/04/11 1740      Chief Complaint  Patient presents with  . Atrial Fibrillation    HPI Palpitations  Onset - today, about 2 hrs ago Acute in onset Course - improved Duration - 1.5 hours Associated symptoms - mild SOB No cp reported No focal weakness No syncope No abd pain No vomiting/diarrhea No fever No focal weakness reported  Pt reports long h/o paroxysmal afib, has had increasing episodes over past month, this one occurred earlier today now resolved He reports mild headache and no other complaints  Past Medical History  Diagnosis Date  . Ascending aortic aneurysm   . Gallstones   . Aortic stenosis     due to bicuspid AoV; S/p mechanical AVR  . OSA (obstructive sleep apnea)     Past Surgical History  Procedure Date  . Aortic valve replacement   . Lumbar fusion     Family History  Problem Relation Age of Onset  . Lung cancer      History  Substance Use Topics  . Smoking status: Never Smoker   . Smokeless tobacco: Not on file  . Alcohol Use: No      Review of Systems  All other systems reviewed and are negative.    Allergies  Morphine and related and Percocet  Home Medications   Current Outpatient Rx  Name Route Sig Dispense Refill  . ASPIRIN 81 MG PO TABS Oral Take 81 mg by mouth daily.      . CORICIDIN HBP COLD/FLU PO Oral Take 2 tablets by mouth daily as needed. For flu-like symptoms     . LISINOPRIL 20 MG PO TABS Oral Take 20 mg by mouth daily.      Marland Kitchen SIMVASTATIN 40 MG PO TABS Oral Take 40 mg by mouth at bedtime.      . WARFARIN SODIUM 10 MG PO TABS Oral Take 10-15 mg by mouth daily. Take 15mg  every day except thurs. Take 10mg  on thurs       BP 157/91  Pulse 111  Temp(Src) 98 F (36.7 C) (Oral)  Resp 20  SpO2 97%  Physical Exam  CONSTITUTIONAL: Well developed/well nourished HEAD AND FACE: Normocephalic/atraumatic EYES:  EOMI/PERRL ENMT: Mucous membranes moist NECK: supple no meningeal signs CV: regular, murmur noted LUNGS: Lungs are clear to auscultation bilaterally, no apparent distress ABDOMEN: soft, nontender, no rebound or guarding GU:no cva tenderness NEURO: Pt is awake/alert, moves all extremitiesx4 No focal motor deficits noted EXTREMITIES: pulses normal, full ROM SKIN: warm, color normal PSYCH: no abnormalities of mood noted   ED Course  Procedures   Labs Reviewed  I-STAT, CHEM 8  PROTIME-INR   MDM  Nursing notes reviewed and considered in documentation All labs/vitals reviewed and considered Previous records reviewed and considered       5:59 PM D/w dr Riley Kill, cardiology Reviewed history, and now in sinus rhythm If INR >2, start metoprolol XL 25 daily Call his cardiologist in two days Do not check troponin at this time  7:22 PM Pt without symptoms at this time INR < 3.5, his goal is above 3.5 due to valve He usually takes one extra coumadin which he will do tonight and then go back to baseline dosing tomorrow Will start toprol and call his cardiologist in two days   Date: 09/04/2011  Rate: 104  Rhythm: normal sinus rhythm  QRS Axis:  normal  Intervals: normal  ST/T Wave abnormalities: nonspecific ST changes  Conduction Disutrbances:none  Narrative Interpretation:   Old EKG Reviewed: changes noted Previous EKG showed afib not currently in afib at this time   Joya Gaskins, MD 09/04/11 1925

## 2011-09-04 NOTE — ED Notes (Signed)
Patient is AOx4 and is comfortable being discharged.  He is familiar with the s/s he should look for that should cause him to come back to the ER for reevaluation.

## 2011-09-04 NOTE — ED Notes (Signed)
At rest while in truck, palpitations, on way over arrhythmia converted to Sr. Pt. States, "breathing heavy" to convert rhythm, no sob.

## 2011-09-06 ENCOUNTER — Encounter (HOSPITAL_COMMUNITY): Payer: Self-pay | Admitting: Emergency Medicine

## 2011-09-06 ENCOUNTER — Other Ambulatory Visit: Payer: Self-pay

## 2011-09-06 ENCOUNTER — Emergency Department (HOSPITAL_COMMUNITY)
Admission: EM | Admit: 2011-09-06 | Discharge: 2011-09-06 | Disposition: A | Discharge status: Home / Self Care | Payer: Self-pay | Attending: Emergency Medicine | Admitting: Emergency Medicine

## 2011-09-06 ENCOUNTER — Emergency Department (HOSPITAL_COMMUNITY): Payer: Self-pay

## 2011-09-06 DIAGNOSIS — Z7982 Long term (current) use of aspirin: Secondary | ICD-10-CM | POA: Insufficient documentation

## 2011-09-06 DIAGNOSIS — Z7901 Long term (current) use of anticoagulants: Secondary | ICD-10-CM | POA: Insufficient documentation

## 2011-09-06 DIAGNOSIS — G4733 Obstructive sleep apnea (adult) (pediatric): Secondary | ICD-10-CM | POA: Insufficient documentation

## 2011-09-06 DIAGNOSIS — Z79899 Other long term (current) drug therapy: Secondary | ICD-10-CM | POA: Insufficient documentation

## 2011-09-06 DIAGNOSIS — R079 Chest pain, unspecified: Secondary | ICD-10-CM | POA: Insufficient documentation

## 2011-09-06 DIAGNOSIS — R110 Nausea: Secondary | ICD-10-CM | POA: Insufficient documentation

## 2011-09-06 DIAGNOSIS — R011 Cardiac murmur, unspecified: Secondary | ICD-10-CM | POA: Insufficient documentation

## 2011-09-06 LAB — BASIC METABOLIC PANEL
BUN: 20 mg/dL (ref 6–23)
CO2: 25 mEq/L (ref 19–32)
Calcium: 9.3 mg/dL (ref 8.4–10.5)
Chloride: 104 mEq/L (ref 96–112)
Creatinine, Ser: 1.07 mg/dL (ref 0.50–1.35)
GFR calc Af Amer: >90 mL/min (ref >90)
GFR calc non Af Amer: 80 mL/min — ABNORMAL LOW (ref >90)
Glucose, Bld: 117 mg/dL — ABNORMAL HIGH (ref 70–99)
Potassium: 3.7 mEq/L (ref 3.5–5.1)
Sodium: 139 mEq/L (ref 135–145)

## 2011-09-06 LAB — PROTIME-INR
INR: 4.49 — ABNORMAL HIGH (ref 0.00–1.49)
Prothrombin Time: 43.3 seconds — ABNORMAL HIGH (ref 11.6–15.2)

## 2011-09-06 LAB — CBC
HCT: 41.5 % (ref 39.0–52.0)
Hemoglobin: 14.3 g/dL (ref 13.0–17.0)
MCH: 30.5 pg (ref 26.0–34.0)
MCHC: 34.5 g/dL (ref 30.0–36.0)
MCV: 88.5 fL (ref 78.0–100.0)
Platelets: 152 10*3/uL (ref 150–400)
RBC: 4.69 MIL/uL (ref 4.22–5.81)
RDW: 12.6 % (ref 11.5–15.5)
WBC: 5.8 10*3/uL (ref 4.0–10.5)

## 2011-09-06 LAB — POCT I-STAT TROPONIN I: Troponin i, poc: 0.01 ng/mL (ref 0.00–0.08)

## 2011-09-06 NOTE — ED Notes (Signed)
Pt was just seen here Tues for intermittent afib.  He awoke this am with a sharp, intermittent L chest pain.  Pain is stabbing.  Pt c/o non-productive, dry cough with no fever.  Pt is nsr on monitor.  Lung sounds clear bil.

## 2011-09-06 NOTE — ED Notes (Signed)
PT. WOKE UP THIS MORNING 3 AM WITH LEFT CHEST PAIN AND NAUSEA , DRY COUGH , DENIES VOMITTING OR DIAPHORESIS.
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
## 2011-09-07 ENCOUNTER — Telehealth (HOSPITAL_COMMUNITY): Payer: Self-pay | Admitting: *Deleted

## 2011-09-07 NOTE — Telephone Encounter (Signed)
Pt called this AM stating that he went into a-fib on Fri and came to the ER where he converted back to SR on his own, they did start him on Toprol 25 and wanted him to f/u w/Dr Bensimhon he states he hasn't had any more episodes since then, appt sch for tom at 4 pm

## 2011-09-08 ENCOUNTER — Ambulatory Visit (HOSPITAL_COMMUNITY)
Admission: RE | Admit: 2011-09-08 | Discharge: 2011-09-08 | Disposition: A | Discharge status: Home / Self Care | Payer: Self-pay | Source: Ambulatory Visit | Attending: Internal Medicine | Admitting: Internal Medicine

## 2011-09-08 ENCOUNTER — Other Ambulatory Visit: Payer: Self-pay

## 2011-09-08 VITALS — BP 132/84 | HR 63 | Wt 231.2 lb

## 2011-09-08 DIAGNOSIS — Z954 Presence of other heart-valve replacement: Secondary | ICD-10-CM | POA: Insufficient documentation

## 2011-09-08 DIAGNOSIS — R002 Palpitations: Secondary | ICD-10-CM | POA: Insufficient documentation

## 2011-09-08 DIAGNOSIS — I4891 Unspecified atrial fibrillation: Secondary | ICD-10-CM | POA: Insufficient documentation

## 2011-09-08 NOTE — Progress Notes (Signed)
HPI:  Mark [name] is a very pleasant 51 year old male with a history of bicuspid aortic valve complicated by endocarditis.  He is status post St. Jude aortic valve replacement in 1983.  He also has a history of hyperlipidemia and PAF s/p DC-CV in 9/11. OSA noncompliant  With CPAP.   Over the past few years year, he has had some progression in the gradients across his valve.  He underwent cardiac catheterization in May 2009 which showed normal coronary arteries.  We did not cross the valve, obviously this is a mechanical valve.  However, the valve leaflets were seen to be opening well on fluoroscopy.  He did undergo a TEE.  While there was some turbulence around the valve, the leaflets seem to be moving well.  There was no obvious pannus formation.  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 He was seen by Dr. [name] who agreed  with continue watchul waiting.  Repeat ECHO 4/12 EF 55-60% mean gradient .   Here for f/u. Over past month really struggling with AF. Now having episodes up to 7 times per day. Lasts a few seconds to over an hour. Feels wiped out and SOB. +tachypalpitations. Went to ER on Friday but converted before he got there. Was started on Toprol. Snores heavily. Not using hs CPAP.   No edema, orthopnea or PND. Very anxious. No ETOH. Very anxious.   Last lipids: TC 183 TG 106 HDL 36 LDL 126  ROS: All systems negative except as listed in HPI, PMH and Problem List.  Past Medical History  Diagnosis Date  . Ascending aortic aneurysm   . Gallstones   . Aortic stenosis     due to bicuspid AoV; S/p mechanical AVR  . OSA (obstructive sleep apnea)     Current Outpatient Prescriptions  Medication Sig Dispense Refill  . aspirin 81 MG tablet Take 81 mg by mouth daily.        Marland Kitchen lisinopril (PRINIVIL,ZESTRIL) 20 MG tablet Take 20 mg by mouth daily.        . metoprolol succinate (TOPROL-XL) 25 MG 24 hr  tablet Take 1 tablet (25 mg total) by mouth daily.  10 tablet  0  . simvastatin (ZOCOR) 40 MG tablet Take 40 mg by mouth at bedtime.        Marland Kitchen warfarin (COUMADIN) 10 MG tablet Take 10-15 mg by mouth daily. Take 15mg  every day except thurs. Take 10mg  on thurs          PHYSICAL EXAM: Filed Vitals:   09/08/11 1604  BP: 132/84  Pulse: 63   General:  Well appearing. no resp difficulty HEENT: normal Neck: supple. no JVD. Carotids 2+ bilat; +bilat radiated bruits.  Cor: PMI nondisplaced. Regular rate & rhythm. No rubs, gallops, mechanical s2. very crisp. 2/6 SEM at RSB Lungs: clear Abdomen: Obese.NT. nondistended.Peri Jefferson bowel sounds. Extremities: no cyanosis, clubbing, rash, edema. Neuro: alert & orientedx3, cranial nerves grossly intact. moves all 4 extremities w/o difficulty. affect pleasant   ECG: NSR 64 inferior TWI (no change)  ASSESSMENT & PLAN:

## 2011-09-08 NOTE — Patient Instructions (Addendum)
Xanax .25 mg as needed  Your physician has recommended that you wear a holter monitor. Holter monitors are medical devices that record the heart's electrical activity. Doctors most often use these monitors to diagnose arrhythmias. Arrhythmias are problems with the speed or rhythm of the heartbeat. The monitor is a small, portable device. You can wear one while you do your normal daily activities. This is usually used to diagnose what is causing palpitations/syncope (passing out).  [place] TOMORROW AT 10 AM  Your physician recommends that you schedule a follow-up appointment in: 1 month

## 2011-09-09 ENCOUNTER — Encounter (INDEPENDENT_AMBULATORY_CARE_PROVIDER_SITE_OTHER): Payer: Self-pay

## 2011-09-09 DIAGNOSIS — I4891 Unspecified atrial fibrillation: Secondary | ICD-10-CM

## 2011-09-11 ENCOUNTER — Encounter: Payer: Self-pay | Admitting: *Deleted

## 2011-09-11 ENCOUNTER — Other Ambulatory Visit (INDEPENDENT_AMBULATORY_CARE_PROVIDER_SITE_OTHER): Payer: Self-pay | Admitting: *Deleted

## 2011-09-11 DIAGNOSIS — E785 Hyperlipidemia, unspecified: Secondary | ICD-10-CM

## 2011-09-11 LAB — HEPATIC FUNCTION PANEL
ALT: 24 U/L (ref 0–53)
AST: 25 U/L (ref 0–37)
Albumin: 4.2 g/dL (ref 3.5–5.2)
Alkaline Phosphatase: 62 U/L (ref 39–117)
Bilirubin, Direct: 0.2 mg/dL (ref 0.0–0.3)
Total Bilirubin: 1.7 mg/dL — ABNORMAL HIGH (ref 0.3–1.2)
Total Protein: 7.2 g/dL (ref 6.0–8.3)

## 2011-09-11 LAB — LIPID PANEL
Cholesterol: 171 mg/dL (ref 0–200)
HDL: 38 mg/dL — ABNORMAL LOW (ref >39.00)
LDL Cholesterol: 116 mg/dL — ABNORMAL HIGH (ref 0–99)
Total CHOL/HDL Ratio: 5
Triglycerides: 85 mg/dL (ref 0.0–149.0)
VLDL: 17 mg/dL (ref 0.0–40.0)

## 2011-09-14 ENCOUNTER — Ambulatory Visit (INDEPENDENT_AMBULATORY_CARE_PROVIDER_SITE_OTHER): Payer: Self-pay | Admitting: *Deleted

## 2011-09-14 ENCOUNTER — Ambulatory Visit (INDEPENDENT_AMBULATORY_CARE_PROVIDER_SITE_OTHER): Payer: Self-pay | Admitting: Pharmacist

## 2011-09-14 VITALS — Wt 233.8 lb

## 2011-09-14 DIAGNOSIS — Z7901 Long term (current) use of anticoagulants: Secondary | ICD-10-CM

## 2011-09-14 DIAGNOSIS — R0989 Other specified symptoms and signs involving the circulatory and respiratory systems: Secondary | ICD-10-CM

## 2011-09-14 DIAGNOSIS — E785 Hyperlipidemia, unspecified: Secondary | ICD-10-CM

## 2011-09-14 DIAGNOSIS — Z952 Presence of prosthetic heart valve: Secondary | ICD-10-CM

## 2011-09-14 DIAGNOSIS — Z954 Presence of other heart-valve replacement: Secondary | ICD-10-CM

## 2011-09-14 DIAGNOSIS — I4891 Unspecified atrial fibrillation: Secondary | ICD-10-CM

## 2011-09-14 LAB — POCT INR: INR: 3.7

## 2011-09-14 NOTE — Assessment & Plan Note (Addendum)
Assessment: TC 171 (goal <200), TG 85 (goal <150), HDL 38 (goal >40), LDL 116 (goal <130). Current lipid panel at goal except low HDL; TC and LDL have increased since last lipid panel but remain in goal range, TG have decreased. Improved diet and exercise; has cut out eating biscuits and drinking sugary drinks and increased intake of healthier snackslike fruit.   Plan: 1. Continue taking simvastatin 40 mg daily 2. Continue current diet regimen lower in fat, carbohydrates and higher in fiber; continuing eating smaller portions 3. Continue exercising, working up to 30 minutes daily (treadmill, bike) 4. Return for follow up labs and visit in 4 months (~May)

## 2011-09-14 NOTE — Patient Instructions (Addendum)
Thank you for coming into lipid clinic today.   Your cholesterol panel is at goal except your good cholesterol is a little lower than we would like (38, goal >40).   Continue taking your simvastatin 40 mg daily.  Continue your lower carbohydrate, lower fat, higher fiber diet. Congratulations on cutting out biscuits and sugary drinks and increasing on higher fiber foods like fruits and vegetables.   Continue working up to 30 minutes of exercise daily (treadmill and biking).  We will follow up in ~4 months with follow-up labs.

## 2011-09-14 NOTE — Progress Notes (Signed)
HPI: Mr. Goldsborough returns to lipid clinic in good spirits for follow-up of his lipid panel. He is currently on simvastatin 40 mg daily for his hyperlipidemia. He reports taking it daily without side effects of muscle pain. States that he has had a recent death in his family which made things hard but is now back with eating healthier and exercising.   Diet: He states that he has cut out eating biscuits and has switched to eating Special K bars or bars with oats and biscuits wrapped in lettuce. Pt states that he has started eating lunch more regularly consisting of grapes/bananas, grilled chicken, canned tuna with crackers, fish tacos, shrimp. For dinner he eats fish, chicken, and veggies (e.g. steamed carrots, broccoli, corn, green beans, squash, and zucchini). He has switched from drinking 5-6 20 oz sodas to water and decaffeinate tea with 3/4 cup sugar per one gallon. (He had also been drinking juice and 3-4 pouches of Capri Sun until he realized it was high in sugar content.) Pt also states that switching to square dinner plates has allowed him to eat less than when his family was using round plates.   Exercise: Pt states he has been working towards exercising 30 minutes daily on the treadmill.   Lipid Panel: 09/11/2011 TC  171 TG  85 HDL  38 LDL  116  Wt 233 lb 12.8 oz (106.051 kg)   Current Outpatient Prescriptions on File Prior to Visit  Medication Sig Dispense Refill  . simvastatin (ZOCOR) 40 MG tablet Take 40 mg by mouth at bedtime.        Marland Kitchen warfarin (COUMADIN) 10 MG tablet Take 10-15 mg by mouth daily. Take 15mg  every day except thurs. Take 10mg  on thurs       . aspirin 81 MG tablet Take 81 mg by mouth daily.        Marland Kitchen lisinopril (PRINIVIL,ZESTRIL) 20 MG tablet Take 20 mg by mouth daily.        . metoprolol succinate (TOPROL-XL) 25 MG 24 hr tablet Take 1 tablet (25 mg total) by mouth daily.  10 tablet  0

## 2011-09-18 DIAGNOSIS — R002 Palpitations: Secondary | ICD-10-CM | POA: Insufficient documentation

## 2011-09-18 NOTE — Assessment & Plan Note (Signed)
Stable Continue coumadin 

## 2011-09-18 NOTE — Assessment & Plan Note (Addendum)
Suspect he is having PAF but we have no objective documentation of this. Will place 2 week event monitor and if we document multiple episodes of AF will likely need admission for Tikosyn as baseline HR too low to tolerate further rate control efforts. We had a long talk about triggers and treatments of AF. Stressed need to use CPAP. Will give small dose of Xanax to help with panic symptoms.   .Total MD time spent = 40 mins with over 70% of that time dedicated to counseling and discussions described above.

## 2011-09-23 ENCOUNTER — Encounter (HOSPITAL_COMMUNITY): Payer: Self-pay | Admitting: *Deleted

## 2011-09-28 ENCOUNTER — Ambulatory Visit (INDEPENDENT_AMBULATORY_CARE_PROVIDER_SITE_OTHER): Payer: Self-pay | Admitting: Thoracic Surgery (Cardiothoracic Vascular Surgery)

## 2011-09-28 ENCOUNTER — Encounter: Payer: Self-pay | Admitting: Thoracic Surgery (Cardiothoracic Vascular Surgery)

## 2011-09-28 VITALS — BP 125/84 | HR 63 | Resp 20 | Ht 72.0 in | Wt 234.0 lb

## 2011-09-28 DIAGNOSIS — I712 Thoracic aortic aneurysm, without rupture: Secondary | ICD-10-CM

## 2011-09-28 DIAGNOSIS — I719 Aortic aneurysm of unspecified site, without rupture: Secondary | ICD-10-CM | POA: Insufficient documentation
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
## 2011-10-09 ENCOUNTER — Ambulatory Visit (INDEPENDENT_AMBULATORY_CARE_PROVIDER_SITE_OTHER): Payer: Self-pay | Admitting: *Deleted

## 2011-10-09 ENCOUNTER — Encounter (HOSPITAL_COMMUNITY): Payer: Self-pay

## 2011-10-09 DIAGNOSIS — Z952 Presence of prosthetic heart valve: Secondary | ICD-10-CM

## 2011-10-09 DIAGNOSIS — Z954 Presence of other heart-valve replacement: Secondary | ICD-10-CM

## 2011-10-09 DIAGNOSIS — I4891 Unspecified atrial fibrillation: Secondary | ICD-10-CM

## 2011-10-09 DIAGNOSIS — Z7901 Long term (current) use of anticoagulants: Secondary | ICD-10-CM

## 2011-10-09 LAB — POCT INR: INR: 5.9

## 2011-10-14 ENCOUNTER — Ambulatory Visit (HOSPITAL_COMMUNITY)
Admission: RE | Admit: 2011-10-14 | Discharge: 2011-10-14 | Disposition: A | Discharge status: Home / Self Care | Payer: Self-pay | Source: Ambulatory Visit | Attending: Internal Medicine | Admitting: Internal Medicine

## 2011-10-14 ENCOUNTER — Telehealth: Payer: Self-pay

## 2011-10-14 VITALS — BP 134/62 | HR 62 | Wt 237.0 lb

## 2011-10-14 DIAGNOSIS — R5381 Other malaise: Secondary | ICD-10-CM | POA: Insufficient documentation

## 2011-10-14 DIAGNOSIS — I4891 Unspecified atrial fibrillation: Secondary | ICD-10-CM | POA: Insufficient documentation

## 2011-10-14 DIAGNOSIS — R5383 Other fatigue: Secondary | ICD-10-CM

## 2011-10-14 DIAGNOSIS — R002 Palpitations: Secondary | ICD-10-CM | POA: Insufficient documentation

## 2011-10-14 NOTE — Assessment & Plan Note (Addendum)
Most recent monitor showed SR with PVCs with a brief run of SVT but Mr. [name] feels that his palpitations are worse since taking the monitor off.  Will add 30 day monitor to hopefully capture definitive evidence of AF. If we find it will consider possible Tikosyn administration.  He is agreeable to this plan.  He has also agreed to under a sleep study therefore will refer to pulmonary.    Patient seen and examined with Ulyess Blossom PA-C. We discussed all aspects of the encounter. I agree with the assessment and plan as stated above.  Symptoms seem classic for PAF (and he is at high risk for this given untreated OSA) but we have been unable to capture any AF on Holter monitor - which I reviewed again personally today. That said, I also think anxiety is playing a role here and may be confusing the picture. Will place 30-day event monitor and see if we can capture AF. He is on coumadin for AVR already. He agrees to sleep study.

## 2011-10-14 NOTE — Patient Instructions (Addendum)
Will place 30 day event monitor.   Will refer for sleep study.   Follow up in 6 weeks.

## 2011-10-14 NOTE — Telephone Encounter (Signed)
Mark MONITOR WILL BE MAIL TO Mark [name] PER HEATHER S AND I HAVE TALK TO THE [name]  TO LET HIM NO THAT [employer] WILL CALL HIM.

## 2011-10-14 NOTE — Progress Notes (Signed)
HPI:  Mark [name] is a very pleasant 51 year old male with a history of bicuspid aortic valve complicated by endocarditis.  He is status post St. Jude aortic valve replacement in 1983.  He also has a history of hyperlipidemia and PAF s/p DC-CV in 9/11. OSA noncompliant  With CPAP.   Over the past few years year, he has had some progression in the gradients across his valve.  He underwent cardiac catheterization in May 2009 which showed normal coronary arteries.  We did not cross the valve, obviously this is a mechanical valve.  However, the valve leaflets were seen to be opening well on fluoroscopy.  He did undergo a TEE.  While there was some turbulence around the valve, the leaflets seem to be moving well.  There was no obvious pannus formation.  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 He was seen by Dr. [name] who agreed  with continue watchul waiting.  Repeat ECHO 4/12 EF 55-60% mean gradient .   Last lipids: TC 183 TG 106 HDL 36 LDL 126  Returns for follow up today after holter monitor was placed.  Sunday had an episode of lightheadedness, N requiring him to lay down for 2 hours.  Palpitations are driving him crazy.  Had one episode while on the monitor.  Holter monitor with SR with PVCs, with brief 5 beat run SVT.  Very anxious about palpitations.  Feels wiped out and some dyspnea with episodes.  Snores a ton.    ROS: All systems negative except as listed in HPI, PMH and Problem List.  Past Medical History  Diagnosis Date  . Ascending aortic aneurysm   . Gallstones   . Aortic stenosis     due to bicuspid AoV; S/p mechanical AVR  . OSA (obstructive sleep apnea)     Current Outpatient Prescriptions  Medication Sig Dispense Refill  . aspirin 81 MG tablet Take 81 mg by mouth daily.        Marland Kitchen lisinopril (PRINIVIL,ZESTRIL) 20 MG tablet Take 20 mg by mouth daily.        . simvastatin (ZOCOR) 40 MG  tablet Take 40 mg by mouth at bedtime.        Marland Kitchen warfarin (COUMADIN) 10 MG tablet Take 10-15 mg by mouth daily. Take 15mg  every day except thurs. Take 10mg  on thurs          PHYSICAL EXAM: Filed Vitals:   10/14/11 0953  BP: 134/62  Pulse: 62  Weight: 237 lb (107.502 kg)  SpO2: 98%   General:  Well appearing. no resp difficulty HEENT: normal Neck: supple. no JVD. Carotids 2+ bilat; +bilat radiated bruits.  Cor: PMI nondisplaced. Regular rate & rhythm. No rubs, gallops, mechanical s2. very crisp. 2/6 SEM at RSB Lungs: clear Abdomen: Obese.NT. nondistended.Peri Jefferson bowel sounds. Extremities: no cyanosis, clubbing, rash, edema. Neuro: alert & orientedx3, cranial nerves grossly intact. moves all 4 extremities w/o difficulty. affect pleasant   ASSESSMENT & PLAN:

## 2011-10-23 ENCOUNTER — Telehealth (HOSPITAL_COMMUNITY): Payer: Self-pay | Admitting: *Deleted

## 2011-10-23 NOTE — Telephone Encounter (Signed)
Patient would like Heather to call him due to a question he has about a monitor that was to be ordered for him, he has not received anything and would like to know if there is a 1800 # he could call to check on status.

## 2011-10-23 NOTE — Telephone Encounter (Signed)
Pt is still waiting on [employer] monitor he received a call from them last week and they said they were going to send him some paperwork but he has not received anything, gave him [employer] # to f/u with them, also left mess for Windell Moulding at [place] to see if she knew of anything going on

## 2011-11-02 ENCOUNTER — Institutional Professional Consult (permissible substitution): Payer: Self-pay | Admitting: Pulmonary Disease

## 2011-11-11 ENCOUNTER — Telehealth (HOSPITAL_COMMUNITY): Payer: Self-pay | Admitting: *Deleted

## 2011-11-11 ENCOUNTER — Encounter: Payer: Self-pay | Admitting: Internal Medicine

## 2011-11-11 NOTE — Telephone Encounter (Signed)
Will discuss with Dr. Gala Romney and call Mrs. Medal back.

## 2011-11-11 NOTE — Telephone Encounter (Signed)
Mark [name] called today to let you know that Mark [name] is in [facility] in the ER for AFIB.  They told her that they were admitting him there and she wants Dr Gala Romney to be aware and to call her back.  Thanks.

## 2011-11-11 NOTE — Telephone Encounter (Signed)
Discussed with Dr. Gala Romney who also talked to Dr. Graciela Husbands.  He would prefer the patient be on Flecainide 100 mg BID.  I called to discuss this with the patient's wife and talked to the hospitalist that was admitting him.  He was not comfortable starting Flecainide so he was going to try and rate control him today with diltiazem and hopeful conversion to sinus rhythm over night.  I have given him the office number if he has any questions.  The patient is to call when he is discharged to discuss flecainide initiation.

## 2011-11-12 ENCOUNTER — Telehealth (HOSPITAL_COMMUNITY): Payer: Self-pay | Admitting: *Deleted

## 2011-11-12 MED ORDER — FLECAINIDE ACETATE 100 MG PO TABS: 100.0000 mg | ORAL_TABLET | Freq: Two times a day (BID) | ORAL | Status: DC

## 2011-11-12 NOTE — Telephone Encounter (Signed)
Discussed with Dr. Gala Romney, he would like to start Flecainide 100 mg BID and continue diltiazem for an AV nodal blocker.  I have faxed in the script for flecainide and the patient is aware.  He will follow up on 3/25.

## 2011-11-12 NOTE — Telephone Encounter (Signed)
Mark [name] called today, he has been released from [facility] with a prescription for Dilt-CD 180 mg/day for his heart rate.  He wants to know if he should go ahead and fill this prescription or if you want him to take something different.  Please follow up, thanks.

## 2011-11-23 ENCOUNTER — Ambulatory Visit (INDEPENDENT_AMBULATORY_CARE_PROVIDER_SITE_OTHER): Payer: Self-pay | Admitting: *Deleted

## 2011-11-23 ENCOUNTER — Ambulatory Visit (HOSPITAL_COMMUNITY)
Admission: RE | Admit: 2011-11-23 | Discharge: 2011-11-23 | Disposition: A | Discharge status: Home / Self Care | Payer: Self-pay | Source: Ambulatory Visit | Attending: Internal Medicine | Admitting: Internal Medicine

## 2011-11-23 VITALS — BP 104/62 | HR 67 | Wt 229.5 lb

## 2011-11-23 DIAGNOSIS — I4891 Unspecified atrial fibrillation: Secondary | ICD-10-CM | POA: Insufficient documentation

## 2011-11-23 DIAGNOSIS — Z954 Presence of other heart-valve replacement: Secondary | ICD-10-CM

## 2011-11-23 DIAGNOSIS — Z952 Presence of prosthetic heart valve: Secondary | ICD-10-CM

## 2011-11-23 DIAGNOSIS — Z7901 Long term (current) use of anticoagulants: Secondary | ICD-10-CM

## 2011-11-23 LAB — POCT INR: INR: 8

## 2011-11-23 LAB — PROTIME-INR
INR: 7 (ref <1.50)
Prothrombin Time: 61.3 seconds — ABNORMAL HIGH (ref 11.6–15.2)

## 2011-11-23 NOTE — Assessment & Plan Note (Signed)
INR up today. Being managed by coumadin clinic.

## 2011-11-23 NOTE — Progress Notes (Signed)
Encounter addended by: Dolores Patty, MD on: 11/23/2011  4:46 PM<BR>     Documentation filed: Notes Section

## 2011-11-23 NOTE — Assessment & Plan Note (Signed)
Continues to have symptomatic palpitations with evidence of AF at [facility]. Unable to tolerate flecainide. Will plan to admit and initiate Tikosyn next week.

## 2011-11-23 NOTE — Patient Instructions (Signed)
The Hospital will call you Monday AM when they have a room available for you.

## 2011-11-23 NOTE — Progress Notes (Addendum)
HPI:  Mark [name] is a very pleasant 51 year old male with a history of bicuspid aortic valve complicated by endocarditis.  He is status post St. Jude aortic valve replacement in 1983.  He also has a history of hyperlipidemia and PAF s/p DC-CV in 9/11. OSA noncompliant  With CPAP.   Over the past few years year, he has had some progression in the gradients across his valve.  He underwent cardiac catheterization in May 2009 which showed normal coronary arteries.  We did not cross the valve, obviously this is a mechanical valve.  However, the valve leaflets were seen to be opening well on fluoroscopy.  He did undergo a TEE.  While there was some turbulence around the valve, the leaflets seem to be moving well.  There was no obvious pannus formation.  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 He was seen by Dr. [name] who agreed  with continue watchul waiting.  Repeat ECHO 4/12 EF 55-60% mean gradient .   Last lipids: TC 183 TG 106 HDL 36 LDL 126  Has been having problem with severe palpitations. Holter monitor with SR with PVCs, with brief 5 beat run SVT.  Admitted to [facility] 2 weeks ago and found to have AF with RVR. Started on diltiazem and converted overnight. I started him on flecainide as outpatient and he is unable to tolerate due to severe nausea. Continues with palpitations several times per week. Sleep study has been rescheduled for this week. INR today 8.0.    ROS: All systems negative except as listed in HPI, PMH and Problem List.  Past Medical History  Diagnosis Date  . Ascending aortic aneurysm   . Gallstones   . Aortic stenosis     due to bicuspid AoV; S/p mechanical AVR  . OSA (obstructive sleep apnea)     Current Outpatient Prescriptions  Medication Sig Dispense Refill  . aspirin 81 MG tablet Take 81 mg by mouth daily.        Marland Kitchen diltiazem (CARDIZEM CD) 180 MG 24 hr capsule Take 180 mg  by mouth daily.      . flecainide (TAMBOCOR) 100 MG tablet Take 1 tablet (100 mg total) by mouth 2 (two) times daily.  60 tablet  6  . lisinopril (PRINIVIL,ZESTRIL) 20 MG tablet Take 20 mg by mouth daily.        . simvastatin (ZOCOR) 40 MG tablet Take 40 mg by mouth at bedtime.        Marland Kitchen warfarin (COUMADIN) 10 MG tablet Take 10-15 mg by mouth daily. Take 15mg  every day except thurs. Take 10mg  on thurs          PHYSICAL EXAM: Filed Vitals:   11/23/11 1555  BP: 104/62  Pulse: 67  Weight: 229 lb 8 oz (104.101 kg)  SpO2: 97%   General:  Well appearing. no resp difficulty HEENT: normal Neck: supple. no JVD. Carotids 2+ bilat; +bilat radiated bruits.  Cor: PMI nondisplaced. Regular rate & rhythm. No rubs, gallops, mechanical s2. very crisp. 2/6 SEM at RSB Lungs: clear Abdomen: Obese.NT. nondistended.Peri Jefferson bowel sounds. Extremities: no cyanosis, clubbing, rash, edema. Neuro: alert & orientedx3, cranial nerves grossly intact. moves all 4 extremities w/o difficulty. affect pleasant  ECG: NSR 65 inf TWI  ASSESSMENT & PLAN:

## 2011-11-23 NOTE — Progress Notes (Signed)
Encounter addended by: Noralee Space, RN on: 11/23/2011  4:35 PM<BR>     Documentation filed: Patient Instructions Section, Orders

## 2011-11-25 NOTE — Progress Notes (Signed)
Encounter addended by: Cresenciano Genre on: 11/25/2011  7:47 AM<BR>     Documentation filed: Charges VN

## 2011-11-30 ENCOUNTER — Other Ambulatory Visit: Payer: Self-pay

## 2011-11-30 ENCOUNTER — Encounter (HOSPITAL_COMMUNITY): Payer: Self-pay | Admitting: Physician Assistant

## 2011-11-30 ENCOUNTER — Inpatient Hospital Stay (HOSPITAL_COMMUNITY)
Admission: AD | Admit: 2011-11-30 | Discharge: 2011-12-02 | DRG: 310 | Disposition: A | Discharge status: Home / Self Care | Payer: Self-pay | Source: Ambulatory Visit | Attending: Internal Medicine | Admitting: Internal Medicine

## 2011-11-30 DIAGNOSIS — H9209 Otalgia, unspecified ear: Secondary | ICD-10-CM | POA: Diagnosis present

## 2011-11-30 DIAGNOSIS — E785 Hyperlipidemia, unspecified: Secondary | ICD-10-CM | POA: Diagnosis present

## 2011-11-30 DIAGNOSIS — Z952 Presence of prosthetic heart valve: Secondary | ICD-10-CM

## 2011-11-30 DIAGNOSIS — Z954 Presence of other heart-valve replacement: Secondary | ICD-10-CM

## 2011-11-30 DIAGNOSIS — I350 Nonrheumatic aortic (valve) stenosis: Secondary | ICD-10-CM

## 2011-11-30 DIAGNOSIS — I10 Essential (primary) hypertension: Secondary | ICD-10-CM | POA: Diagnosis present

## 2011-11-30 DIAGNOSIS — Z7901 Long term (current) use of anticoagulants: Secondary | ICD-10-CM

## 2011-11-30 DIAGNOSIS — I4891 Unspecified atrial fibrillation: Principal | ICD-10-CM | POA: Diagnosis present

## 2011-11-30 HISTORY — DX: Essential (primary) hypertension: I10

## 2011-11-30 HISTORY — DX: Cardiac arrhythmia, unspecified: I49.9

## 2011-11-30 HISTORY — DX: Headache: R51

## 2011-11-30 LAB — BASIC METABOLIC PANEL
BUN: 14 mg/dL (ref 6–23)
CO2: 25 mEq/L (ref 19–32)
Calcium: 8.9 mg/dL (ref 8.4–10.5)
Chloride: 107 mEq/L (ref 96–112)
Creatinine, Ser: 0.91 mg/dL (ref 0.50–1.35)
GFR calc Af Amer: >90 mL/min (ref >90)
GFR calc non Af Amer: >90 mL/min (ref >90)
Glucose, Bld: 94 mg/dL (ref 70–99)
Potassium: 4 mEq/L (ref 3.5–5.1)
Sodium: 140 mEq/L (ref 135–145)

## 2011-11-30 LAB — PROTIME-INR
INR: 2.97 — ABNORMAL HIGH (ref 0.00–1.49)
Prothrombin Time: 31.4 seconds — ABNORMAL HIGH (ref 11.6–15.2)

## 2011-11-30 LAB — MAGNESIUM: Magnesium: 2.2 mg/dL (ref 1.5–2.5)

## 2011-11-30 MED ORDER — ASPIRIN EC 81 MG PO TBEC
81.0000 mg | DELAYED_RELEASE_TABLET | Freq: Every day | ORAL | Status: DC
  Administered 2011-11-30 – 2011-12-02 (×3): 81 mg via ORAL
  Filled 2011-11-30 (×3): qty 1

## 2011-11-30 MED ORDER — ATORVASTATIN CALCIUM 20 MG PO TABS
20.0000 mg | ORAL_TABLET | Freq: Every day | ORAL | Status: DC
  Filled 2011-11-30 (×3): qty 1

## 2011-11-30 MED ORDER — SODIUM CHLORIDE 0.9 % IV SOLN: 250.0000 mL | INTRAVENOUS | Status: DC | PRN

## 2011-11-30 MED ORDER — DOFETILIDE 500 MCG PO CAPS
500.0000 ug | ORAL_CAPSULE | Freq: Two times a day (BID) | ORAL | Status: DC
  Administered 2011-11-30: 500 ug via ORAL
  Filled 2011-11-30 (×3): qty 1

## 2011-11-30 MED ORDER — DILTIAZEM HCL ER COATED BEADS 180 MG PO CP24
180.0000 mg | ORAL_CAPSULE | Freq: Two times a day (BID) | ORAL | Status: DC
  Administered 2011-11-30 – 2011-12-02 (×5): 180 mg via ORAL
  Filled 2011-11-30 (×6): qty 1

## 2011-11-30 MED ORDER — ACETAMINOPHEN 325 MG PO TABS
650.0000 mg | ORAL_TABLET | ORAL | Status: DC | PRN
  Administered 2011-11-30 – 2011-12-01 (×3): 650 mg via ORAL
  Filled 2011-11-30 (×3): qty 2

## 2011-11-30 MED ORDER — WARFARIN - PHARMACIST DOSING INPATIENT
Freq: Every day | Status: DC
  Administered 2011-11-30: 18:00:00

## 2011-11-30 MED ORDER — ONDANSETRON HCL 4 MG/2ML IJ SOLN: 4.0000 mg | Freq: Four times a day (QID) | INTRAMUSCULAR | Status: DC | PRN

## 2011-11-30 MED ORDER — SODIUM CHLORIDE 0.9 % IJ SOLN: 3.0000 mL | INTRAMUSCULAR | Status: DC | PRN

## 2011-11-30 MED ORDER — SIMVASTATIN 40 MG PO TABS: 40.0000 mg | ORAL_TABLET | Freq: Every day | ORAL | Status: DC

## 2011-11-30 MED ORDER — LISINOPRIL 20 MG PO TABS
20.0000 mg | ORAL_TABLET | Freq: Every day | ORAL | Status: DC
  Administered 2011-11-30 – 2011-12-02 (×3): 20 mg via ORAL
  Filled 2011-11-30 (×3): qty 1

## 2011-11-30 MED ORDER — SODIUM CHLORIDE 0.9 % IJ SOLN
3.0000 mL | Freq: Two times a day (BID) | INTRAMUSCULAR | Status: DC
  Administered 2011-11-30 – 2011-12-02 (×5): 3 mL via INTRAVENOUS

## 2011-11-30 MED ORDER — POTASSIUM CHLORIDE CRYS ER 20 MEQ PO TBCR
20.0000 meq | EXTENDED_RELEASE_TABLET | Freq: Once | ORAL | Status: AC
  Administered 2011-11-30: 20 meq via ORAL
  Filled 2011-11-30: qty 1

## 2011-11-30 MED ORDER — WARFARIN SODIUM 7.5 MG PO TABS
15.0000 mg | ORAL_TABLET | Freq: Once | ORAL | Status: AC
  Administered 2011-11-30: 15 mg via ORAL
  Filled 2011-11-30: qty 2

## 2011-11-30 MED ORDER — DOFETILIDE 500 MCG PO CAPS: 500.0000 ug | ORAL_CAPSULE | Freq: Two times a day (BID) | ORAL | Status: DC

## 2011-11-30 NOTE — Progress Notes (Signed)
ANTICOAGULATION CONSULT NOTE - Initial Consult  Pharmacy Consult for Warfarin, Tikosyn Indication: atrial fibrillation  Allergies  Allergen Reactions  . Morphine And Related Nausea And Vomiting  . Percocet (Oxycodone-Acetaminophen) Nausea And Vomiting    Patient Measurements: Height: 5\' 10"  (177.8 cm) Weight: 231 lb 11.3 oz (105.1 kg) (scale B) IBW/kg (Calculated) : 73   Vital Signs: Temp: 97.9 F (36.6 C) (04/01 1035) Temp src: Oral (04/01 1035) BP: 131/82 mmHg (04/01 1035) Pulse Rate: 58  (04/01 1035)  Labs: No results found for this basename: HGB:2,HCT:3,PLT:3,APTT:3,LABPROT:3,INR:3,HEPARINUNFRC:3,CREATININE:3,CKTOTAL:3,CKMB:3,TROPONINI:3 in the last 72 hours Estimated Creatinine Clearance: 102.5 ml/min (by C-G formula based on Cr of 1.07).  Medical History: Past Medical History  Diagnosis Date  . Ascending aortic aneurysm   . Gallstones   . Aortic stenosis     due to bicuspid AoV; S/p mechanical AVR  . OSA (obstructive sleep apnea)     Medications:  Prescriptions prior to admission  Medication Sig Dispense Refill  . aspirin EC 81 MG tablet Take 81 mg by mouth daily.      Marland Kitchen diltiazem (CARDIZEM CD) 180 MG 24 hr capsule Take 180 mg by mouth 2 (two) times daily.       Marland Kitchen lisinopril (PRINIVIL,ZESTRIL) 20 MG tablet Take 20 mg by mouth daily.       . simvastatin (ZOCOR) 40 MG tablet Take 40 mg by mouth at bedtime.       Marland Kitchen warfarin (COUMADIN) 10 MG tablet Take 10-15 mg by mouth daily. Take 15mg  every day except thurs. Take 10mg  on thurs        Admit Complaint: 51 y.o.  male  admitted 11/30/2011 to initiate tikosyn for atrial fibrillation.  Pharmacy consulted to continue warfarin.  Home warfarin dose: 15 mg daily, except 10 mg daily  Assessment: Anticoagulation: atrial fibrillation INR pending, last dose taken last night at home.  Follow up INR for dose.  Cardiovascular: atrial fibrillation, aortic valve replacement, hyperlipidemia: to continue all home meds.  Last dose  of flecainide last night at 10p at home, prior to evaluation of lab results, earliest first dose of tikosyn 2200 tonight.  Reviewed patient's medication list. He is currently not taking any other antiarrhythmics or medications contraindicated with dofetilide (verapamil, cimetidine, hydrochlorothiazide, trimethoprim, itraconazole, ketoconazole, prochlorperazine, or megestrol).  PTA Medication Issues: All Home Meds Ordered:  Best Practices: DVT Prophylaxis:  Ambulatory  Goal of Therapy:  INR 2-3 Follow renal function, electrolytes, potential drug interactions with tikosyn . Provide education kit and 1 week supply at discharge.  Plan:  1. Daily INR 2. Evaluate INR baseline prior to continuing home warfarin dose. 3. Follow up K, Mg, SCr for initiation of dofetilide.   4. Follow up renal function, electrolytes, success of initiation and facilitate 1 week discharge supply as clinically indicated.  Guila Owensby Merrily Brittle 11/30/2011,12:09 PM

## 2011-11-30 NOTE — Progress Notes (Signed)
Pt c/o headache.  Dr Chesley Mires notied  And ordered Tylenol 650mg  P0 q 4hrs PRN.  Given to pt as ordered.  Will cont. To monitor.  Amanda Pea, RN

## 2011-11-30 NOTE — Progress Notes (Signed)
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 He is status post St. Jude aortic valve replacement in 1983. He also has a history of hyperlipidemia and PAF s/p DC-CV in 9/11. OSA noncompliant With CPAP.  Over the past few years year, he has had some progression in the gradients across his valve. He underwent cardiac catheterization in May 2009 which showed normal coronary arteries. We did not cross the valve, obviously this is a mechanical valve. However, the valve leaflets were seen to be opening well on fluoroscopy. He did undergo a TEE. While there was some turbulence around the valve, the leaflets seem to be moving well. There was no obvious pannus formation. Gradient on his transthoracic echo was within the moderate range at 33. 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 He was seen by Dr. [name] who agreed with continue watchul waiting.  Repeat ECHO 4/12 EF 55-60% mean gradient .  Last lipids: TC 183 TG 106 HDL 36 LDL 126  Has been having problem with severe palpitations. Holter monitor with SR with PVCs, with brief 5 beat run SVT. Admitted to [facility] 2 weeks ago and found to have AF with RVR. Started on diltiazem and converted overnight. I started him on flecainide as outpatient and he is unable to tolerate due to severe nausea. Continues with palpitations several times per week. Sleep study has been rescheduled for this week. INR today 8.0.  ROS: All systems negative except as listed in HPI, PMH and Problem List.  Past Medical History   Diagnosis  Date   .  Ascending aortic aneurysm    .  Gallstones    .  Aortic stenosis      due to bicuspid AoV; S/p mechanical AVR   .  OSA (obstructive sleep apnea)      Current Outpatient Prescriptions   Medication  Sig  Dispense  Refill   .  aspirin 81 MG tablet  Take 81 mg by mouth  daily.     Marland Kitchen  diltiazem (CARDIZEM CD) 180 MG 24 hr capsule  Take 180 mg by mouth daily.     .  flecainide (TAMBOCOR) 100 MG tablet  Take 1 tablet (100 mg total) by mouth 2 (two) times daily.  60 tablet  6   .  lisinopril (PRINIVIL,ZESTRIL) 20 MG tablet  Take 20 mg by mouth daily.     .  simvastatin (ZOCOR) 40 MG tablet  Take 40 mg by mouth at bedtime.     Marland Kitchen  warfarin (COUMADIN) 10 MG tablet  Take 10-15 mg by mouth daily. Take 15mg  every day except thurs. Take 10mg  on thurs      PHYSICAL EXAM:  Filed Vitals:    11/23/11 1555   BP:  104/62   Pulse:  67   Weight:  229 lb 8 oz (104.101 kg)   SpO2:  97%    General: Well appearing. no resp difficulty  HEENT: normal. TMs clear bilaterally. No bulging, erythema or exudate. Neck: supple. no JVD. Carotids 2+ bilat; +bilat radiated bruits.  Cor: PMI nondisplaced. Regular rate & rhythm. No rubs, gallops, mechanical s2. very crisp. 2/6 SEM at RSB  Lungs: clear  Abdomen: Obese.NT. nondistended.Peri Jefferson bowel sounds.  Extremities: no cyanosis, clubbing, rash, edema. Neuro: alert & orientedx3, cranial nerves grossly intact. moves all 4 extremities w/o  difficulty. 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 Jude AVR in 1983; chronic Coumadin anticoagulation), paroxysmal atrial fibrillation (with RVR), history of SVT, HTN, HL and OSA who was direct-admitted to [facility] hospital today for Tikosyn initiation.   1. Paroxysmal atrial fibrillation- patient notes persistent palpitations with occasional associated lightheadedness since seeing Dr. Gala Romney last week. Currently sinus bradycardia with 1st degree AVB on telemetry. Asymptomatic. Patient did state that he took last Flecainide dose at 10:00 PM last night. Per discussion with the pharmacist, will allow a full 24-hour wash out before initiating Tikosyn.   - Entered dofetilide order set  -  Will await lab work and formal EKG for CrCl, Mg, K and QTc  - No outpatient drug interactions, will continue outpatient meds   2. Mechanical AVR- stable; last Coumadin taken around 10:00 PM last night. Will be dosed per pharmacy.   - Continue Coumadin  3. Left ear pain- mildly tender to palpation of auricle. TMs clear bilaterally without erythema, bulging or exudate. Afebrile.   - Will continue to monitor  4. HTN- stable  - Will resume ACEi/BB/Cardizem  5. HL- stable  - Will resume home statin   Patient seen and examined with Hurman Horn PA-C. We discussed all aspects of the encounter. I agree with the assessment and plan as stated above. Mr. Kerth has been struggling with symptomatic PAF. Currently in sinus rhythm on exam and monitor. Will start Tikosyn load. Continue coumadin (takes for mAVR).  Kadynce Bonds,MD 2:21 PM

## 2011-11-30 NOTE — Plan of Care (Signed)
Problem: Phase I Progression Outcomes Goal: OOB as tolerated unless otherwise ordered Outcome: Progressing Pt OOB to chair.     

## 2011-11-30 NOTE — Progress Notes (Signed)
Pharmacy/ tikosyn, coumadin- addenum  Labs  INR= 2.97 SCr=0.91, K=4.0, Mg=2.2 CrCl> 100   A:  51 yo male with afin to start tikosyn and electrolytes are within range no adjustments are needed for renal function  P:  -continue tikosyn at bid -Continue coumadin at home dose but will watch cloasely as INR on high end of range  Harland German, Pharm D 11/30/2011 3:41 PM

## 2011-12-01 LAB — PROTIME-INR
INR: 2.95 — ABNORMAL HIGH (ref 0.00–1.49)
Prothrombin Time: 31.2 seconds — ABNORMAL HIGH (ref 11.6–15.2)

## 2011-12-01 LAB — BASIC METABOLIC PANEL
BUN: 15 mg/dL (ref 6–23)
CO2: 28 mEq/L (ref 19–32)
Calcium: 9 mg/dL (ref 8.4–10.5)
Chloride: 108 mEq/L (ref 96–112)
Creatinine, Ser: 1.08 mg/dL (ref 0.50–1.35)
GFR calc Af Amer: >90 mL/min (ref >90)
GFR calc non Af Amer: 79 mL/min — ABNORMAL LOW (ref >90)
Glucose, Bld: 101 mg/dL — ABNORMAL HIGH (ref 70–99)
Potassium: 4.4 mEq/L (ref 3.5–5.1)
Sodium: 143 mEq/L (ref 135–145)

## 2011-12-01 LAB — MAGNESIUM: Magnesium: 2.2 mg/dL (ref 1.5–2.5)

## 2011-12-01 MED ORDER — WARFARIN SODIUM 7.5 MG PO TABS
15.0000 mg | ORAL_TABLET | Freq: Once | ORAL | Status: AC
  Administered 2011-12-01: 15 mg via ORAL
  Filled 2011-12-01: qty 2

## 2011-12-01 MED ORDER — DOFETILIDE 500 MCG PO CAPS
500.0000 ug | ORAL_CAPSULE | Freq: Two times a day (BID) | ORAL | Status: DC
  Administered 2011-12-01 – 2011-12-02 (×3): 500 ug via ORAL
  Filled 2011-12-01 (×5): qty 1

## 2011-12-01 NOTE — Progress Notes (Signed)
   Subjective:   Got first dose of dofetilide last night. No palpitations, CP or SOB. QT stable.     Intake/Output Summary (Last 24 hours) at 12/01/11 0859 Last data filed at 12/01/11 0800  Gross per 24 hour  Intake   1443 ml  Output   2400 ml  Net   -957 ml    Current meds:    . aspirin EC  81 mg Oral Daily  . atorvastatin  20 mg Oral q1800  . diltiazem  180 mg Oral BID  . dofetilide  500 mcg Oral Q12H  . lisinopril  20 mg Oral Daily  . potassium chloride  20 mEq Oral Once  . sodium chloride  3 mL Intravenous Q12H  . warfarin  15 mg Oral ONCE-1800  . Warfarin - Pharmacist Dosing Inpatient   Does not apply q1800  . DISCONTD: dofetilide  500 mcg Oral Q12H  . DISCONTD: simvastatin  40 mg Oral QHS   Infusions:     Objective:  Blood pressure 119/67, pulse 51, temperature 98.3 F (36.8 C), temperature source Oral, resp. rate 20, height 5\' 10"  (1.778 m), weight 104.554 kg (230 lb 8 oz), SpO2 97.00%. Weight change:   Physical Exam: General:  Well appearing. No resp difficulty HEENT: normal Neck: supple. JVP flat . Carotids 2+ bilat; no bruits. No lymphadenopathy or thryomegaly appreciated. Cor: PMI nondisplaced. Regular rate & rhythm. Mechanical s2. 2/6 AS murmur Lungs: clear Abdomen: soft, nontender, nondistended. No hepatosplenomegaly. No bruits or masses. Good bowel sounds. Extremities: no cyanosis, clubbing, rash, edema Neuro: alert & orientedx3, cranial nerves grossly intact. moves all 4 extremities w/o difficulty. Affect pleasant  Telemetry: sinus brady   Lab Results: Basic Metabolic Panel:  Lab 12/01/11 1610 11/30/11 1213  NA 143 140  K 4.4 4.0  CL 108 107  CO2 28 25  GLUCOSE 101* 94  BUN 15 14  CREATININE 1.08 0.91  CALCIUM 9.0 8.9  MG 2.2 2.2  PHOS -- --   Liver Function Tests: No results found for this basename: AST:5,ALT:5,ALKPHOS:5,BILITOT:5,PROT:5,ALBUMIN:5 in the last 168 hours No results found for this basename: LIPASE:5,AMYLASE:5 in the  last 168 hours No results found for this basename: AMMONIA:5 in the last 168 hours CBC: No results found for this basename: WBC:5,NEUTROABS:5,HGB:5,HCT:5,MCV:5,PLT:5 in the last 168 hours Cardiac Enzymes: No results found for this basename: CKTOTAL:5,CKMB:5,CKMBINDEX:5,TROPONINI:5 in the last 168 hours BNP: No components found with this basename: POCBNP:5 CBG: No results found for this basename: GLUCAP:5 in the last 168 hours Microbiology: Lab Results  Component Value Date   CULT ESCHERICHIA COLI 04/16/2010   No results found for this basename: CULT:2,SDES:2 in the last 168 hours  Imaging: No results found.   ASSESSMENT:  1. PAF now in sinus - loading dofetilide 2. Congenital AS s/p mAVR - on coumadin  PLAN/DISCUSSION: Continue dofetilide load. Home tomorrow night after 5th dose. Continue to follow K and Mag.    LOS: 1 day    Arvilla Meres, MD 12/01/2011, 8:59 AM

## 2011-12-01 NOTE — Plan of Care (Signed)
Problem: Phase I Progression Outcomes Goal: Voiding-avoid urinary catheter unless indicated Outcome: Completed/Met Date Met:  12/01/11 Patient uses urinal

## 2011-12-01 NOTE — Progress Notes (Signed)
ANTICOAGULATION CONSULT NOTE - Follow Up Consult  Pharmacy Consult for Coumadin, Tikosyn Indication: atrial fibrillation, AVR  Allergies  Allergen Reactions  . Morphine And Related Nausea And Vomiting  . Percocet (Oxycodone-Acetaminophen) Nausea And Vomiting   Vital Signs: Temp: 98.3 F (36.8 C) (04/02 0522) Temp src: Oral (04/02 0522) BP: 119/67 mmHg (04/02 0522) Pulse Rate: 51  (04/02 0522)  Labs:  Basename 12/01/11 0510 11/30/11 1400 11/30/11 1213  HGB -- -- --  HCT -- -- --  PLT -- -- --  APTT -- -- --  LABPROT 31.2* 31.4* --  INR 2.95* 2.97* --  HEPARINUNFRC -- -- --  CREATININE 1.08 -- 0.91  CKTOTAL -- -- --  CKMB -- -- --  TROPONINI -- -- --   Estimated Creatinine Clearance: 101.3 ml/min (by C-G formula based on Cr of 1.08).  Assessment: 50yom continues on coumadin with a therapeutic INR. No bleeding noted.  Received first dose of Tikosyn last night and plan to discharge home tomorrow after 5th dose. Electrolytes, QTc, renal function all stable.  Goal of Therapy:  INR 2-3   Plan:  1) Continue with home dose of coumadin--give 15mg  x 1 tonight 2) Continue Tikosyn po bid 3) Follow up INR, K, Mg, sCr, QTc in AM  Fredrik Rigger 12/01/2011,9:15 AM

## 2011-12-01 NOTE — Progress Notes (Signed)
Nutrition Brief Note:   RD pulled to pt from nutrition risk report: unintentional weight loss. Pt reports he had norovirus recently and lost about 10 lbs. Pt no longer has symptoms. Normal appetite.  Rd noticed pt with CHF followed by clinic, had high salt snack foods in room. Pt states that he eats these items infrequently and follows a low salt diet at home. Denies need for additional education at this time.  Diet:  Heart, NAS. PO intake 75% breakfast.  Chart reviewed, no additional nutrition interventions at this time. Please consult RD if needed.   Clarene Duke MARIE

## 2011-12-01 NOTE — Progress Notes (Signed)
UR Completed. Simmons, Panagiotis Oelkers F [phone]  

## 2011-12-01 NOTE — Progress Notes (Signed)
   CARE MANAGEMENT NOTE 12/01/2011  Patient:  Mark [name], Mark [name]   Account Number:  [id number]  Date Initiated:  12/01/2011  Documentation initiated by:  GRAVES-BIGELOW,Kaysen Deal  Subjective/Objective Assessment:   Pt admitted for Tikosyn load and is a self pay. Pt states he is self employeed. He will call family for tax forms from 2011.     Action/Plan:   CM did give pt Connection to Care Financial assistance application. MD please fill out in order for CM to fax in. Pt will need 14 day supply Tikosyn no refills  in order for co to have time to get approved and medication shipped to pt.   Anticipated DC Date:  12/02/2011   Anticipated DC Plan:  HOME/SELF CARE      DC Planning Services  CM consult  Medication Assistance      Choice offered to / List presented to:             Status of service:  Completed, signed off Medicare Important Message given?   (If response is "NO", the following Medicare IM given date fields will be blank) Date Medicare IM given:   Date Additional Medicare IM given:    Discharge Disposition:  HOME/SELF CARE  Per UR Regulation:    If discussed at Long Length of Stay Meetings, dates discussed:    Comments:  12-01-11 [address] Tomi Bamberger, RN,BSN [phone] CM did call [employer] and they have a 72 hour turn around turn around time to see if pt is approved for assistance. Then it will take 5-7 business days for delivery for medicaiton. Please write Rx for 14 day supply tikosyn to be filled in main pharmacy before d/c. Thanks

## 2011-12-02 ENCOUNTER — Other Ambulatory Visit: Payer: Self-pay

## 2011-12-02 LAB — BASIC METABOLIC PANEL
BUN: 18 mg/dL (ref 6–23)
CO2: 26 mEq/L (ref 19–32)
Calcium: 8.9 mg/dL (ref 8.4–10.5)
Chloride: 105 mEq/L (ref 96–112)
Creatinine, Ser: 0.99 mg/dL (ref 0.50–1.35)
GFR calc Af Amer: >90 mL/min (ref >90)
GFR calc non Af Amer: >90 mL/min (ref >90)
Glucose, Bld: 93 mg/dL (ref 70–99)
Potassium: 4.4 mEq/L (ref 3.5–5.1)
Sodium: 139 mEq/L (ref 135–145)

## 2011-12-02 LAB — MAGNESIUM: Magnesium: 2.1 mg/dL (ref 1.5–2.5)

## 2011-12-02 LAB — PROTIME-INR
INR: 2.91 — ABNORMAL HIGH (ref 0.00–1.49)
Prothrombin Time: 30.9 seconds — ABNORMAL HIGH (ref 11.6–15.2)

## 2011-12-02 MED ORDER — DOFETILIDE 500 MCG PO CAPS: 500.0000 ug | ORAL_CAPSULE | Freq: Two times a day (BID) | ORAL | Status: DC

## 2011-12-02 MED ORDER — DILTIAZEM HCL ER COATED BEADS 180 MG PO CP24: 180.0000 mg | ORAL_CAPSULE | Freq: Every day | ORAL | Status: DC

## 2011-12-02 MED ORDER — DOFETILIDE 500 MCG PO CAPS
500.0000 ug | ORAL_CAPSULE | Freq: Two times a day (BID) | ORAL | Status: DC
  Administered 2011-12-02: 500 ug via ORAL
  Filled 2011-12-02 (×2): qty 1

## 2011-12-02 NOTE — Progress Notes (Signed)
   CARE MANAGEMENT NOTE 12/02/2011  Patient:  Mark [name], Mark [name]   Account Number:  [id number]  Date Initiated:  12/01/2011  Documentation initiated by:  GRAVES-BIGELOW,Paradise Vensel  Subjective/Objective Assessment:   Pt admitted for Tikosyn load and is a self pay. Pt states he is self employeed. He will call family for tax forms from 2011.     Action/Plan:   CM did give pt Connection to Care Financial assistance application. MD please fill out in order for CM to fax in. Pt will need 14 day supply Tikosyn no refills  in order for co to have time to get approved and medication shipped to pt.   Anticipated DC Date:  12/02/2011   Anticipated DC Plan:  HOME/SELF CARE      DC Planning Services  CM consult  Medication Assistance      Choice offered to / List presented to:             Status of service:  Completed, signed off Medicare Important Message given?   (If response is "NO", the following Medicare IM given date fields will be blank) Date Medicare IM given:   Date Additional Medicare IM given:    Discharge Disposition:  HOME/SELF CARE  Per UR Regulation:    If discussed at Long Length of Stay Meetings, dates discussed:    Comments:  12-02-11 1019 Tomi Bamberger, RN,BSN [phone] CM did fax information to [employer] for pt assistance. Pt will receive 14 day supply of medication from main pharmacy via [employer]. RN Pattricia Boss is aware that pt has to get medication before d/c.  12-01-11 [address], RN,BSN [phone] CM did call [employer] and they have a 72 hour turn around turn around time to see if pt is approved for assistance. Then it will take 5-7 business days for delivery for medicaiton. Please write Rx for 14 day supply tikosyn to be filled in main pharmacy before d/c. Thanks

## 2011-12-02 NOTE — Discharge Summary (Signed)
Advanced Heart Failure Team  Discharge Summary   Patient ID: Mark [name] MRN: [id number], DOB/AGE: 10-Jul-1961 51 y.o. Admit date: 11/30/2011 D/C date:     12/02/2011   Primary Discharge Diagnoses:  1. PAF now in sinus - loading dofetilide  2. Congenital AS s/p mAVR - on coumadin   Secondary Discharge Diagnoses:  1. HTN 2. 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 He is status post St. Jude aortic valve replacement in 1983. He also has a history of hyperlipidemia and PAF s/p DC-CV in 9/11. OSA noncompliant With CPAP.   Has been having problem with severe palpitations. Holter monitor with SR with PVCs, with brief 5 beat run SVT. Recently admitted to [facility] with documeted AF with RVR. Flecainide started but he couldn't tolerate due to nausea.  Admitted for Tikosyn load on 4/1. Initial EKG showed Sinus brady with 1st degree AV block and QTc= 377 ms. K was 4.0 and Mag 2.2 upon admission. Tikosyn BID started. QTc's once initiated on medication have been and then stayed below 400 ms and remained in sinus brady with 1 degree AV block, no change in dosage needed.  Patient has tolerated Tikosyn with no CP, nausea, vomiting, or SOB. He is felt [name] for discharge after 5 doses.   Discharge Vitals: Blood pressure 107/53, pulse 51, temperature 98.1 F (36.7 C), temperature source Oral, resp. rate 16, height 5\' 10"  (1.778 m), weight 104.2 kg (229 lb 11.5 oz), SpO2 99.00%.  Physical Exam:  General: Well appearing. No resp difficulty  HEENT: normal  Neck: supple. JVP flat . Carotids 2+ bilat; no bruits. No lymphadenopathy or thryomegaly appreciated.  Cor: PMI nondisplaced. Regular rate & rhythm. Mechanical s2. 2/6 AS murmur  Lungs: clear  Abdomen: soft, nontender, nondistended. No hepatosplenomegaly. No bruits or masses. Good bowel sounds.  Extremities: no cyanosis, clubbing, rash,  edema  Neuro: alert & orientedx3, cranial nerves grossly intact. moves all 4 extremities w/o difficulty. Affect pleasant      Labs: Lab Results  Component Value Date   WBC 5.8 09/06/2011   HGB 14.3 09/06/2011   HCT 41.5 09/06/2011   MCV 88.5 09/06/2011   PLT 152 09/06/2011     Lab 12/02/11 0611  NA 139  K 4.4  CL 105  CO2 26  BUN 18  CREATININE 0.99  CALCIUM 8.9  PROT --  BILITOT --  ALKPHOS --  ALT --  AST --  GLUCOSE 93   No results found for this basename: CKTOTAL:4,CKMB:4,TROPONINI:4 in the last 72 hours Lab Results  Component Value Date   CHOL 171 09/11/2011   HDL 38.00* 09/11/2011   LDLCALC 116* 09/11/2011   TRIG 85.0 09/11/2011   BNP (last 3 results) No results found for this basename: PROBNP:3 in the last 8760 hours  Diagnostic Studies/Procedures   No results found.  Discharge Medications   Medication List  As of 12/02/2011  8:49 AM   TAKE these medications         aspirin EC 81 MG tablet   Take 81 mg by mouth daily.      diltiazem 180 MG 24 hr capsule   Commonly known as: CARDIZEM CD   Take 1 capsule (180 mg total) by mouth daily.      dofetilide 500 MCG capsule   Commonly known as: TIKOSYN   Take 1 capsule (500 mcg total) by mouth 2 (two) times daily.  lisinopril 20 MG tablet   Commonly known as: PRINIVIL,ZESTRIL   Take 20 mg by mouth daily.      simvastatin 40 MG tablet   Commonly known as: ZOCOR   Take 40 mg by mouth at bedtime.      warfarin 10 MG tablet   Commonly known as: COUMADIN   Take 10-15 mg by mouth daily. Take 15mg  every day except thurs. Take 10mg  on thurs            Disposition   The patient will be discharged in [name] condition to home. Discharge Orders    Future Appointments: Provider: Department: Dept Phone: Center:   12/14/2011 11:45 AM Barbaraann Share, MD [facility] [phone] None   12/24/2011 2:45 PM [facility] [phone] None     Future Orders Please Complete By Expires   Diet  - low sodium heart healthy      Increase activity slowly        Follow-up Information    Follow up with Arvilla Meres, MD on 12/24/2011. ( 2:45 PM               (Gate Code [id number]))    Contact information:   [address] [place] [address] [phone]            Duration of Discharge Encounter: Greater than 35 minutes   Signed, Aundria Rud NP-C 12/02/2011, 8:49 AM  Patient seen and examined with Ulla Potash, NP. We discussed all aspects of the encounter. I agree with the assessment and plan as stated above. He has tolerated Tikosyn load well. ECGS and tele reviewed personally. No evidence of AF. QTc [name]. INR has remained therapeutic. Will d/c after 5th dose tonight.   Bryar Rennie,MD 9:18 AM

## 2011-12-07 ENCOUNTER — Ambulatory Visit (INDEPENDENT_AMBULATORY_CARE_PROVIDER_SITE_OTHER): Payer: Self-pay

## 2011-12-07 ENCOUNTER — Telehealth (HOSPITAL_COMMUNITY): Payer: Self-pay | Admitting: *Deleted

## 2011-12-07 DIAGNOSIS — Z954 Presence of other heart-valve replacement: Secondary | ICD-10-CM

## 2011-12-07 DIAGNOSIS — Z7901 Long term (current) use of anticoagulants: Secondary | ICD-10-CM

## 2011-12-07 DIAGNOSIS — I4891 Unspecified atrial fibrillation: Secondary | ICD-10-CM

## 2011-12-07 DIAGNOSIS — Z952 Presence of prosthetic heart valve: Secondary | ICD-10-CM

## 2011-12-07 LAB — POCT INR: INR: 5.1

## 2011-12-07 NOTE — Telephone Encounter (Signed)
Mark [name] called today.  He has some questions regarding his Tikosyn, and would like a call back. Thanks.

## 2011-12-08 ENCOUNTER — Other Ambulatory Visit: Payer: Self-pay | Admitting: *Deleted

## 2011-12-08 MED ORDER — WARFARIN SODIUM 10 MG PO TABS: ORAL_TABLET | ORAL | Status: DC

## 2011-12-08 NOTE — Telephone Encounter (Signed)
Spoke w/pt yesterday and he states he has been having episodes of his heart "jumping" it only last a few seconds and its gone, discussed w/Dr Bensimhon he does not feel like this is a-fib, if it continues or is bothersome to the pt can wear a monitor, pt states he is ok.  Also pt's Tikosyn arrived from the company he will come by and pick it up tomorrow

## 2011-12-14 ENCOUNTER — Institutional Professional Consult (permissible substitution): Payer: Self-pay | Admitting: Pulmonary Disease

## 2011-12-24 ENCOUNTER — Ambulatory Visit (HOSPITAL_COMMUNITY)
Admission: RE | Admit: 2011-12-24 | Discharge: 2011-12-24 | Disposition: A | Discharge status: Home / Self Care | Payer: Self-pay | Source: Ambulatory Visit | Attending: Internal Medicine | Admitting: Internal Medicine

## 2011-12-24 VITALS — BP 126/84 | HR 61 | Wt 232.2 lb

## 2011-12-24 DIAGNOSIS — I4891 Unspecified atrial fibrillation: Secondary | ICD-10-CM | POA: Insufficient documentation

## 2011-12-24 DIAGNOSIS — R5381 Other malaise: Secondary | ICD-10-CM | POA: Insufficient documentation

## 2011-12-24 DIAGNOSIS — R5383 Other fatigue: Secondary | ICD-10-CM | POA: Insufficient documentation

## 2011-12-24 NOTE — Assessment & Plan Note (Signed)
Suspect multifactorial. Have encouraged him to reschedule sleep study. Will hold diltiazem.

## 2011-12-24 NOTE — Assessment & Plan Note (Signed)
Maintaining SR on Tikosyn. Given fatigue and bradycardia will stop diltiazem (can restart as needed). Continue coumadin.

## 2011-12-24 NOTE — Progress Notes (Signed)
HPI:  Mark [name] is a very pleasant 51 year old male with a history of bicuspid aortic valve complicated by endocarditis.  He is status post St. Jude aortic valve replacement in 1983.  He also has a history of hyperlipidemia and PAF s/p DC-CV in 9/11. OSA noncompliant  With CPAP.   Over the past few years year, he has had some progression in the gradients across his valve.  He underwent cardiac catheterization in May 2009 which showed normal coronary arteries.  We did not cross the valve, obviously this is a mechanical valve.  However, the valve leaflets were seen to be opening well on fluoroscopy.  He did undergo a TEE.  While there was some turbulence around the valve, the leaflets seem to be moving well.  There was no obvious pannus formation.  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 He was seen by Dr. [name] who agreed  with continue watchul waiting.  Repeat ECHO 4/12 EF 55-60% mean gradient .   Last lipids: TC 183 TG 106 HDL 36 LDL 126  Recently found to have AF. Failed flecainide due to n/v. Was loaded on Tikosyn. Has been doing well palpitations much improved. However remains very fatigued. Missed sleep study on April 15. Also c/o erectile dysfunction. No bleeding on coumadin.   ROS: All systems negative except as listed in HPI, PMH and Problem List.  Past Medical History  Diagnosis Date  . Ascending aortic aneurysm   . Gallstones   . Aortic stenosis     due to bicuspid AoV; S/p mechanical AVR  . OSA (obstructive sleep apnea)   . Hypertension   . Dysrhythmia   . Shortness of breath   . Hepatitis   . Chronic kidney disease   . Headache     Current Outpatient Prescriptions  Medication Sig Dispense Refill  . aspirin EC 81 MG tablet Take 81 mg by mouth daily.      Marland Kitchen diltiazem (CARDIZEM CD) 180 MG 24 hr capsule Take 1 capsule (180 mg total) by mouth daily.  30 capsule  6  . dofetilide  (TIKOSYN) 500 MCG capsule Take 500 mcg by mouth 2 (two) times daily.      Marland Kitchen lisinopril (PRINIVIL,ZESTRIL) 20 MG tablet Take 20 mg by mouth daily.       . simvastatin (ZOCOR) 40 MG tablet Take 40 mg by mouth at bedtime.       Marland Kitchen warfarin (COUMADIN) 10 MG tablet Take as directed by coumadin clinic  45 tablet  3  . dofetilide (TIKOSYN) 500 MCG capsule Take 1 capsule (500 mcg total) by mouth 2 (two) times daily.         PHYSICAL EXAM: Filed Vitals:   12/24/11 1454  BP: 126/84  Pulse: 61  Weight: 232 lb 4 oz (105.348 kg)  SpO2: 98%   General:  Well appearing. no resp difficulty HEENT: normal Neck: supple. no JVD. Carotids 2+ bilat; +bilat radiated bruits.  Cor: PMI nondisplaced. Regular rate & rhythm. No rubs, gallops, mechanical s2. very crisp. 2/6 SEM at RSB Lungs: clear Abdomen: Obese.NT. nondistended.Peri Jefferson bowel sounds. Extremities: no cyanosis, clubbing, rash, edema. Neuro: alert & orientedx3, cranial nerves grossly intact. moves all 4 extremities w/o difficulty. affect pleasant  ECG: Sinus brady 58 No ST-T wave abnormalities. QTc  ASSESSMENT & PLAN:

## 2011-12-24 NOTE — Patient Instructions (Signed)
Stop Diltiazem   Your physician recommends that you schedule a follow-up appointment in: 8 weeks

## 2011-12-25 ENCOUNTER — Ambulatory Visit (INDEPENDENT_AMBULATORY_CARE_PROVIDER_SITE_OTHER): Payer: Self-pay | Admitting: Family Medicine

## 2011-12-25 ENCOUNTER — Encounter: Payer: Self-pay | Admitting: Family Medicine

## 2011-12-25 VITALS — BP 120/72 | HR 65 | Temp 98.5°F | Wt 232.0 lb

## 2011-12-25 DIAGNOSIS — J309 Allergic rhinitis, unspecified: Secondary | ICD-10-CM

## 2011-12-25 DIAGNOSIS — N529 Male erectile dysfunction, unspecified: Secondary | ICD-10-CM

## 2011-12-25 DIAGNOSIS — J302 Other seasonal allergic rhinitis: Secondary | ICD-10-CM | POA: Insufficient documentation

## 2011-12-25 NOTE — Patient Instructions (Signed)
Start the Zyrtec daily for the allergies Use Mucinex to thin your congestion We'll notify you of your lab results Call with any questions or concerns Hang in there!!!

## 2011-12-25 NOTE — Progress Notes (Signed)
  Subjective:    Patient ID: Mark [name], male    DOB: July 23, 1961, 51 y.o.   MRN: [id number]  HPI Cough- sxs started 3 weeks ago, hx of allergies.  Cough is wet but not productive.  Not taking anything for seasonal allergies.  Denies PND.  L ear pain.  No facial pain/pressure.  ? Low T- decreased libido, ED, fatigue.  Cards wants him tested.   Review of Systems For ROS see HPI     Objective:   Physical Exam  Vitals reviewed. Constitutional: He appears well-developed and well-nourished. No distress.  HENT:  Head: Normocephalic and atraumatic.  Right Ear: Tympanic membrane normal.  Left Ear: Tympanic membrane normal.  Nose: Mucosal edema and rhinorrhea present. Right sinus exhibits no maxillary sinus tenderness and no frontal sinus tenderness. Left sinus exhibits no maxillary sinus tenderness and no frontal sinus tenderness.  Mouth/Throat: Mucous membranes are normal. Posterior oropharyngeal erythema present. No oropharyngeal exudate or posterior oropharyngeal edema.       + PND  Eyes: Conjunctivae and EOM are normal. Pupils are equal, round, and reactive to light.  Neck: Normal range of motion. Neck supple.  Cardiovascular: Normal rate, regular rhythm and normal heart sounds.   Pulmonary/Chest: Effort normal and breath sounds normal. No respiratory distress. He has no wheezes.       + hacking cough  Lymphadenopathy:    He has no cervical adenopathy.  Skin: Skin is warm and dry.          Assessment & Plan:

## 2011-12-25 NOTE — Assessment & Plan Note (Signed)
New to provider.  Pt's cluster of sxs suspicious for low T.  Check labs.  tx prn.

## 2011-12-25 NOTE — Assessment & Plan Note (Signed)
New.  Pt's cough is likely a result of PND caused by untreated allergies.  Start OTC antihistamine.  Reviewed supportive care and red flags that should prompt return.  Pt expressed understanding and is in agreement w/ plan.

## 2011-12-26 NOTE — Progress Notes (Signed)
Encounter addended by: Sanda Linger on: 12/26/2011 10:10 AM<BR>     Documentation filed: Charges VN

## 2011-12-28 ENCOUNTER — Other Ambulatory Visit: Payer: Self-pay | Admitting: *Deleted

## 2011-12-28 DIAGNOSIS — R7989 Other specified abnormal findings of blood chemistry: Secondary | ICD-10-CM

## 2011-12-28 LAB — TESTOSTERONE, FREE, TOTAL, SHBG
Sex Hormone Binding: 28 nmol/L (ref 13–71)
Testosterone, Free: 43.6 pg/mL — ABNORMAL LOW (ref 47.0–244.0)
Testosterone-% Free: 2.1 % (ref 1.6–2.9)
Testosterone: 208.59 ng/dL — ABNORMAL LOW (ref 300–890)

## 2012-01-05 ENCOUNTER — Other Ambulatory Visit: Payer: Self-pay | Admitting: Pharmacist

## 2012-01-05 ENCOUNTER — Telehealth (HOSPITAL_COMMUNITY): Payer: Self-pay | Admitting: *Deleted

## 2012-01-05 DIAGNOSIS — I4891 Unspecified atrial fibrillation: Secondary | ICD-10-CM

## 2012-01-05 DIAGNOSIS — E785 Hyperlipidemia, unspecified: Secondary | ICD-10-CM

## 2012-01-05 NOTE — Telephone Encounter (Signed)
Per Dr Gala Romney pt needs a 3 week event monitor, pt is aware and order placed

## 2012-01-05 NOTE — Telephone Encounter (Signed)
Spoke w/pt he states this has happened several times and was very bad last night and this AM, it has calmed down now.  He describes it as his heart feels out of rhythm, he states he feels like it is "pushing and pulling" will discuss w/Dr Bensimhon and call him back

## 2012-01-05 NOTE — Telephone Encounter (Signed)
Mr Mark [name] called this am, he woke up in bad afib last night and again this am.  Please call him back. Thanks.

## 2012-01-08 ENCOUNTER — Telehealth (HOSPITAL_COMMUNITY): Payer: Self-pay | Admitting: *Deleted

## 2012-01-08 ENCOUNTER — Ambulatory Visit (INDEPENDENT_AMBULATORY_CARE_PROVIDER_SITE_OTHER): Payer: Self-pay | Admitting: *Deleted

## 2012-01-08 DIAGNOSIS — Z952 Presence of prosthetic heart valve: Secondary | ICD-10-CM

## 2012-01-08 DIAGNOSIS — Z7901 Long term (current) use of anticoagulants: Secondary | ICD-10-CM

## 2012-01-08 DIAGNOSIS — I4891 Unspecified atrial fibrillation: Secondary | ICD-10-CM

## 2012-01-08 DIAGNOSIS — E785 Hyperlipidemia, unspecified: Secondary | ICD-10-CM

## 2012-01-08 DIAGNOSIS — Z954 Presence of other heart-valve replacement: Secondary | ICD-10-CM

## 2012-01-08 LAB — LIPID PANEL
Cholesterol: 167 mg/dL (ref 0–200)
HDL: 41.6 mg/dL (ref >39.00)
LDL Cholesterol: 95 mg/dL (ref 0–99)
Total CHOL/HDL Ratio: 4
Triglycerides: 151 mg/dL — ABNORMAL HIGH (ref 0.0–149.0)
VLDL: 30.2 mg/dL (ref 0.0–40.0)

## 2012-01-08 LAB — POCT INR: INR: 6.4

## 2012-01-08 LAB — HEPATIC FUNCTION PANEL
ALT: 23 U/L (ref 0–53)
AST: 22 U/L (ref 0–37)
Albumin: 4 g/dL (ref 3.5–5.2)
Alkaline Phosphatase: 58 U/L (ref 39–117)
Bilirubin, Direct: 0.1 mg/dL (ref 0.0–0.3)
Total Bilirubin: 1.2 mg/dL (ref 0.3–1.2)
Total Protein: 7 g/dL (ref 6.0–8.3)

## 2012-01-08 LAB — PROTIME-INR
INR: 5.4 ratio (ref 0.8–1.0)
Prothrombin Time: 60.5 s (ref 10.2–12.4)

## 2012-01-08 NOTE — Telephone Encounter (Signed)
Mr Mark [name] called this afternoon. He is having rapid runs AFib, which is making him have SOB.  Please call him back. Thanks.

## 2012-01-08 NOTE — Patient Instructions (Signed)
A full discussion of the nature of anticoagulants has been carried out.  A benefit risk analysis has been presented to the patient, so that they understand the justification for choosing anticoagulation at this time.   Side effects of potential bleeding are discussed.  The patient should avoid any OTC items containing aspirin or ibuprofen, and should avoid great swings in general diet.  Avoid alcohol consumption.  Call if any signs of abnormal bleeding.

## 2012-01-08 NOTE — Telephone Encounter (Signed)
He feels like he going into A-Fib 5-6 seconds and back to normal which makes him breathless. He restarted cardizem 180 mg daily 5 days ago.    Discussed with Dr Gala Romney. We will follow up Monday for event monitor. He instructed to call back Monday am if he continues to have symptoms. Mr Pfluger verbalized understanding.

## 2012-01-13 ENCOUNTER — Ambulatory Visit: Payer: Self-pay | Admitting: Pharmacist

## 2012-01-13 ENCOUNTER — Telehealth (HOSPITAL_COMMUNITY): Payer: Self-pay | Admitting: *Deleted

## 2012-01-13 NOTE — Telephone Encounter (Signed)
Spoke w/pt he was in [place] ER yesterday with a-flutter rate 168 he converted w/IV dilt, per Dr Gala Romney refer to Dr Johney Frame for possible ablation, pt is aware Dr Jenel Lucks office will call him for appointment

## 2012-01-18 ENCOUNTER — Ambulatory Visit: Payer: Self-pay | Admitting: Pharmacist

## 2012-01-29 ENCOUNTER — Ambulatory Visit (INDEPENDENT_AMBULATORY_CARE_PROVIDER_SITE_OTHER): Payer: Self-pay

## 2012-01-29 ENCOUNTER — Ambulatory Visit: Payer: Self-pay

## 2012-01-29 DIAGNOSIS — I4891 Unspecified atrial fibrillation: Secondary | ICD-10-CM

## 2012-01-29 DIAGNOSIS — Z952 Presence of prosthetic heart valve: Secondary | ICD-10-CM

## 2012-01-29 DIAGNOSIS — Z7901 Long term (current) use of anticoagulants: Secondary | ICD-10-CM

## 2012-01-29 DIAGNOSIS — Z954 Presence of other heart-valve replacement: Secondary | ICD-10-CM

## 2012-01-29 DIAGNOSIS — E785 Hyperlipidemia, unspecified: Secondary | ICD-10-CM

## 2012-01-29 LAB — PROTIME-INR
INR: >10 (ref <1.50)
Prothrombin Time: >90 seconds — ABNORMAL HIGH (ref 11.6–15.2)

## 2012-01-29 LAB — POCT INR: INR: 6.8

## 2012-01-29 NOTE — Progress Notes (Signed)
Attempted to call pt with Critical INR results.  LMOM to go to ED immediately with any bleeding problems.  If none hold Coumadin Friday, Saturday, Sunday and Monday.  Asked pt TCB to schedule recheck for Monday 02/01/12.

## 2012-02-02 ENCOUNTER — Ambulatory Visit (INDEPENDENT_AMBULATORY_CARE_PROVIDER_SITE_OTHER): Payer: Self-pay

## 2012-02-02 DIAGNOSIS — I4891 Unspecified atrial fibrillation: Secondary | ICD-10-CM

## 2012-02-02 DIAGNOSIS — Z954 Presence of other heart-valve replacement: Secondary | ICD-10-CM

## 2012-02-02 DIAGNOSIS — Z952 Presence of prosthetic heart valve: Secondary | ICD-10-CM

## 2012-02-02 DIAGNOSIS — Z7901 Long term (current) use of anticoagulants: Secondary | ICD-10-CM

## 2012-02-02 LAB — POCT INR: INR: 2.4

## 2012-02-04 ENCOUNTER — Other Ambulatory Visit: Payer: Self-pay

## 2012-02-04 ENCOUNTER — Ambulatory Visit (HOSPITAL_COMMUNITY)
Admission: RE | Admit: 2012-02-04 | Discharge: 2012-02-04 | Disposition: A | Discharge status: Home / Self Care | Payer: Self-pay | Source: Ambulatory Visit | Attending: Internal Medicine | Admitting: Internal Medicine

## 2012-02-04 ENCOUNTER — Encounter (HOSPITAL_COMMUNITY): Payer: Self-pay

## 2012-02-04 VITALS — BP 119/80 | HR 59 | Ht 72.0 in | Wt 234.4 lb

## 2012-02-04 DIAGNOSIS — N189 Chronic kidney disease, unspecified: Secondary | ICD-10-CM | POA: Insufficient documentation

## 2012-02-04 DIAGNOSIS — R51 Headache: Secondary | ICD-10-CM | POA: Insufficient documentation

## 2012-02-04 DIAGNOSIS — K759 Inflammatory liver disease, unspecified: Secondary | ICD-10-CM | POA: Insufficient documentation

## 2012-02-04 DIAGNOSIS — E785 Hyperlipidemia, unspecified: Secondary | ICD-10-CM | POA: Insufficient documentation

## 2012-02-04 DIAGNOSIS — Z7901 Long term (current) use of anticoagulants: Secondary | ICD-10-CM | POA: Insufficient documentation

## 2012-02-04 DIAGNOSIS — I498 Other specified cardiac arrhythmias: Secondary | ICD-10-CM | POA: Insufficient documentation

## 2012-02-04 DIAGNOSIS — K8020 Calculus of gallbladder without cholecystitis without obstruction: Secondary | ICD-10-CM | POA: Insufficient documentation

## 2012-02-04 DIAGNOSIS — I129 Hypertensive chronic kidney disease with stage 1 through stage 4 chronic kidney disease, or unspecified chronic kidney disease: Secondary | ICD-10-CM | POA: Insufficient documentation

## 2012-02-04 DIAGNOSIS — I719 Aortic aneurysm of unspecified site, without rupture: Secondary | ICD-10-CM | POA: Insufficient documentation

## 2012-02-04 DIAGNOSIS — Z7982 Long term (current) use of aspirin: Secondary | ICD-10-CM | POA: Insufficient documentation

## 2012-02-04 DIAGNOSIS — I359 Nonrheumatic aortic valve disorder, unspecified: Secondary | ICD-10-CM | POA: Insufficient documentation

## 2012-02-04 DIAGNOSIS — Z954 Presence of other heart-valve replacement: Secondary | ICD-10-CM | POA: Insufficient documentation

## 2012-02-04 DIAGNOSIS — I4891 Unspecified atrial fibrillation: Secondary | ICD-10-CM | POA: Insufficient documentation

## 2012-02-04 NOTE — Assessment & Plan Note (Signed)
He continues with an extensive amount of palpitations despite Tikosyn therapy. I am not certain that this is all AF and think it is important to get a monitor on him prior to consideration of AF ablation and the risks/costs associated with it. I discussed this with Dr. Johney Frame who agrees. I will talk to Windell Moulding and see if we can get a [employer] monitor approved. In the meantime, we will increase diltiazem to 240 mg as HR tolerates. Also he will try to use his CPAP. Continue coumadin. Discussed in detail with him and his wife.

## 2012-02-04 NOTE — Progress Notes (Signed)
Patient ID: Mark [name], male   DOB: 08-31-1961, 51 y.o.   MRN: [id number] HPI:  Mark [name] is a very pleasant 51 year old male with a history of bicuspid aortic valve complicated by endocarditis.  He is status post St. Jude aortic valve replacement in 1983.  He also has a history of hyperlipidemia and PAF s/p DC-CV in 9/11. OSA noncompliant  With CPAP.   Over the past few years year, he has had some progression in the gradients across his valve.  He underwent cardiac catheterization in May 2009 which showed normal coronary arteries.  We did not cross the valve, obviously this is a mechanical valve.  However, the valve leaflets were seen to be opening well on fluoroscopy.  He did undergo a TEE.  While there was some turbulence around the valve, the leaflets seem to be moving well.  There was no obvious pannus formation.  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 He was seen by Dr. [name] who agreed  with continue watchul waiting.  Repeat ECHO 4/12 EF 55-60% mean gradient .   Last lipids: TC 183 TG 106 HDL 36 LDL 126  Was having palpitations in January 2013. 48-Holter with SR + PVCs and 5-beat SVT. Then found to have AF earlier this year at [facility] confirmed with 12 lead. Failed flecainide due to n/v. Was loaded on Tikosyn in early April. Did well for a while but then developed recurrent palpitations. Now having palpitations every day usually last a few seconds but can lost longer. + dyspnea and presyncope. Can have multiple episodes per day. Repeat monitor ordered but he hasn't gotten it yet due to lack of insurance and [employer] told him he is a "high risk" for not paying. We also ordered sleep study but he hasn't gone for that yet. No bleeding with coumadin.   We referred to Dr. Johney Frame for consideration of AF ablation but hasn't been able to get it.    ROS: All systems negative except as listed  in HPI, PMH and Problem List.  Past Medical History  Diagnosis Date  . Ascending aortic aneurysm   . Gallstones   . Aortic stenosis     due to bicuspid AoV; S/p mechanical AVR  . OSA (obstructive sleep apnea)   . Hypertension   . Dysrhythmia   . Shortness of breath   . Hepatitis   . Chronic kidney disease   . Headache     Current Outpatient Prescriptions  Medication Sig Dispense Refill  . aspirin EC 81 MG tablet Take 81 mg by mouth daily.      Marland Kitchen diltiazem (TIAZAC) 180 MG 24 hr capsule Take 180 mg by mouth daily.      Marland Kitchen dofetilide (TIKOSYN) 500 MCG capsule Take 1 capsule (500 mcg total) by mouth 2 (two) times daily.      Marland Kitchen lisinopril (PRINIVIL,ZESTRIL) 20 MG tablet Take 20 mg by mouth daily.       . simvastatin (ZOCOR) 40 MG tablet Take 40 mg by mouth at bedtime.       Marland Kitchen warfarin (COUMADIN) 10 MG tablet Take as directed by coumadin clinic  45 tablet  3  . DISCONTD: dofetilide (TIKOSYN) 500 MCG capsule Take 500 mcg by mouth 2 (two) times daily.         PHYSICAL EXAM: Filed Vitals:   02/04/12 1529  BP: 119/80  Pulse: 59  Height: 6' (1.829  m)  Weight: 234 lb 6.4 oz (106.323 kg)   General:  Well appearing. no resp difficulty HEENT: normal Neck: supple. no JVD. Carotids 2+ bilat; +bilat radiated bruits.  Cor: PMI nondisplaced. Regular rate & rhythm. No rubs, gallops, mechanical s2. very crisp. 2/6 SEM at RSB Lungs: clear Abdomen: Obese.NT. nondistended.Peri Jefferson bowel sounds. Extremities: no cyanosis, clubbing, rash, edema. Neuro: alert & orientedx3, cranial nerves grossly intact. moves all 4 extremities w/o difficulty. affect pleasant  ECG: Sinus brady 58 No ST-T wave abnormalities. QTc  ASSESSMENT & PLAN:

## 2012-02-05 NOTE — Progress Notes (Signed)
Encounter addended by: Simon Rhein on: 02/05/2012  8:19 AM<BR>     Documentation filed: Charges VN

## 2012-02-10 ENCOUNTER — Encounter: Payer: Self-pay | Admitting: Internal Medicine

## 2012-02-15 ENCOUNTER — Telehealth (HOSPITAL_COMMUNITY): Payer: Self-pay | Admitting: Internal Medicine

## 2012-02-15 MED ORDER — LISINOPRIL 20 MG PO TABS: 20.0000 mg | ORAL_TABLET | Freq: Every day | ORAL | Status: DC

## 2012-02-15 NOTE — Telephone Encounter (Signed)
Pt aware rx was sent in, he did go by [employer] and complete hardship papers for the monitor but has not heard back he will call Windell Moulding to f/u today

## 2012-02-15 NOTE — Telephone Encounter (Signed)
Mark [name] called and would like a 90 day supply of his lisinopril called into [facility].Pt states this is cheaper for him. Thanks.

## 2012-02-23 ENCOUNTER — Telehealth (HOSPITAL_COMMUNITY): Payer: Self-pay | Admitting: *Deleted

## 2012-02-23 NOTE — Telephone Encounter (Signed)
Kennon Rounds at [facility] has some Tikosyn samples she can give pt to last him until his supply comes in, he is aware and scheduled cvrr appt for Fri am at 7:30

## 2012-02-23 NOTE — Telephone Encounter (Signed)
Mr Tilly called today.  He is running out of Tikosyn and will need to get more medication from [place]. Also, he received his monitor on Sunday and he wants [place] to be aware. Thanks.

## 2012-02-23 NOTE — Telephone Encounter (Signed)
[employer] and ordered [employer], pt id # [id number], the order # is [id number] and should arrive in 7-10 days, pt is aware but states he will run out this week, will find a local pharmacy and call rx in for a few days

## 2012-02-26 ENCOUNTER — Ambulatory Visit (INDEPENDENT_AMBULATORY_CARE_PROVIDER_SITE_OTHER): Payer: Self-pay | Admitting: *Deleted

## 2012-02-26 DIAGNOSIS — Z952 Presence of prosthetic heart valve: Secondary | ICD-10-CM

## 2012-02-26 DIAGNOSIS — Z954 Presence of other heart-valve replacement: Secondary | ICD-10-CM

## 2012-02-26 DIAGNOSIS — Z7901 Long term (current) use of anticoagulants: Secondary | ICD-10-CM

## 2012-02-26 DIAGNOSIS — I4891 Unspecified atrial fibrillation: Secondary | ICD-10-CM

## 2012-02-26 LAB — POCT INR: INR: 5.1

## 2012-03-08 ENCOUNTER — Emergency Department (HOSPITAL_COMMUNITY)
Admission: EM | Admit: 2012-03-08 | Discharge: 2012-03-08 | Disposition: A | Discharge status: Home / Self Care | Payer: Self-pay | Attending: Emergency Medicine | Admitting: Emergency Medicine

## 2012-03-08 ENCOUNTER — Encounter (HOSPITAL_COMMUNITY): Payer: Self-pay | Admitting: Emergency Medicine

## 2012-03-08 ENCOUNTER — Emergency Department (HOSPITAL_COMMUNITY): Payer: Self-pay

## 2012-03-08 DIAGNOSIS — N189 Chronic kidney disease, unspecified: Secondary | ICD-10-CM | POA: Insufficient documentation

## 2012-03-08 DIAGNOSIS — G4733 Obstructive sleep apnea (adult) (pediatric): Secondary | ICD-10-CM | POA: Insufficient documentation

## 2012-03-08 DIAGNOSIS — I4891 Unspecified atrial fibrillation: Secondary | ICD-10-CM | POA: Insufficient documentation

## 2012-03-08 DIAGNOSIS — Z79899 Other long term (current) drug therapy: Secondary | ICD-10-CM | POA: Insufficient documentation

## 2012-03-08 DIAGNOSIS — R5381 Other malaise: Secondary | ICD-10-CM | POA: Insufficient documentation

## 2012-03-08 DIAGNOSIS — I129 Hypertensive chronic kidney disease with stage 1 through stage 4 chronic kidney disease, or unspecified chronic kidney disease: Secondary | ICD-10-CM | POA: Insufficient documentation

## 2012-03-08 DIAGNOSIS — Z7901 Long term (current) use of anticoagulants: Secondary | ICD-10-CM | POA: Insufficient documentation

## 2012-03-08 DIAGNOSIS — R000 Tachycardia, unspecified: Secondary | ICD-10-CM | POA: Insufficient documentation

## 2012-03-08 DIAGNOSIS — Z7982 Long term (current) use of aspirin: Secondary | ICD-10-CM | POA: Insufficient documentation

## 2012-03-08 DIAGNOSIS — R0602 Shortness of breath: Secondary | ICD-10-CM | POA: Insufficient documentation

## 2012-03-08 LAB — CBC
HCT: 40.3 % (ref 39.0–52.0)
Hemoglobin: 14 g/dL (ref 13.0–17.0)
MCH: 30.4 pg (ref 26.0–34.0)
MCHC: 34.7 g/dL (ref 30.0–36.0)
MCV: 87.4 fL (ref 78.0–100.0)
Platelets: 155 10*3/uL (ref 150–400)
RBC: 4.61 MIL/uL (ref 4.22–5.81)
RDW: 12.8 % (ref 11.5–15.5)
WBC: 8.6 10*3/uL (ref 4.0–10.5)

## 2012-03-08 LAB — BASIC METABOLIC PANEL
BUN: 17 mg/dL (ref 6–23)
CO2: 23 mEq/L (ref 19–32)
Calcium: 9.7 mg/dL (ref 8.4–10.5)
Chloride: 102 mEq/L (ref 96–112)
Creatinine, Ser: 1.25 mg/dL (ref 0.50–1.35)
GFR calc Af Amer: 77 mL/min — ABNORMAL LOW (ref >90)
GFR calc non Af Amer: 67 mL/min — ABNORMAL LOW (ref >90)
Glucose, Bld: 156 mg/dL — ABNORMAL HIGH (ref 70–99)
Potassium: 3.6 mEq/L (ref 3.5–5.1)
Sodium: 138 mEq/L (ref 135–145)

## 2012-03-08 LAB — POCT I-STAT TROPONIN I: Troponin i, poc: 0.05 ng/mL (ref 0.00–0.08)

## 2012-03-08 LAB — PROTIME-INR
INR: 2.93 — ABNORMAL HIGH (ref 0.00–1.49)
Prothrombin Time: 31 seconds — ABNORMAL HIGH (ref 11.6–15.2)

## 2012-03-08 NOTE — ED Notes (Signed)
Pt c/o tachycardia, palpitations and SOB x 20 min; pt HR 170bpm; pt sts hx of same
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
## 2012-03-08 NOTE — ED Notes (Signed)
Pt. Came from triage with tachycardia and afib. Pt. Diaphoretic. Hx of same. Pt. Converted himself out of tachycardia. Currently 81 HR.

## 2012-03-15 ENCOUNTER — Telehealth: Payer: Self-pay | Admitting: Internal Medicine

## 2012-03-15 NOTE — Telephone Encounter (Signed)
Called pt to confirm appt, said he didn't know if he still needs it due to having the monitor and being told he didn't need an ablation, pls call

## 2012-03-16 ENCOUNTER — Ambulatory Visit (INDEPENDENT_AMBULATORY_CARE_PROVIDER_SITE_OTHER): Payer: Self-pay | Admitting: *Deleted

## 2012-03-16 ENCOUNTER — Ambulatory Visit (INDEPENDENT_AMBULATORY_CARE_PROVIDER_SITE_OTHER): Payer: Self-pay | Admitting: Internal Medicine

## 2012-03-16 ENCOUNTER — Encounter: Payer: Self-pay | Admitting: Internal Medicine

## 2012-03-16 VITALS — BP 122/79 | HR 61 | Resp 18 | Wt 231.0 lb

## 2012-03-16 DIAGNOSIS — Z7901 Long term (current) use of anticoagulants: Secondary | ICD-10-CM

## 2012-03-16 DIAGNOSIS — I4891 Unspecified atrial fibrillation: Secondary | ICD-10-CM

## 2012-03-16 DIAGNOSIS — G4733 Obstructive sleep apnea (adult) (pediatric): Secondary | ICD-10-CM

## 2012-03-16 DIAGNOSIS — Z952 Presence of prosthetic heart valve: Secondary | ICD-10-CM

## 2012-03-16 DIAGNOSIS — Z954 Presence of other heart-valve replacement: Secondary | ICD-10-CM

## 2012-03-16 LAB — POCT INR: INR: 5.9

## 2012-03-16 NOTE — Assessment & Plan Note (Signed)
Compliance with CPAP strongly encouraged He will follow-up with pulmonary as suggested by Dr Gala Romney

## 2012-03-16 NOTE — Assessment & Plan Note (Signed)
Repeat 2D echo as above

## 2012-03-16 NOTE — Telephone Encounter (Signed)
Spoke with Herbert Seta  Still come to appointment today  Called patient and lmom

## 2012-03-16 NOTE — Assessment & Plan Note (Signed)
The patient presents today for EP consultation regarding symptomatic paroxysmal atrial fibrillation.  He has failed medical therapy with tikosyn and diltiazem.  Recently, he presented to [facility] ER with very elevated ventricular rates.  He has a h/o sleep apnea for which he is noncompliant with CPAP.  His INRs are also very labile.   Today, I have reviewed his event monitor (6/23-7/13/13) in detail today.  This reveals predominantly sinus rhythm though there are several episodes of afib with RVR.  Ventricular rates are at times over 200 bpm with afib.  There is abbarant conduction during afib but not clear pre-excitation.  EKG during sinus does not reveal pre-excitation.  He does not have PVC initiated afib documented.  He does have several salvos of rapidly conducting afib.  I suspect that he has a pulmonary vein trigger.  Therapeutic strategies for afib including medicine and ablation were discussed in detail with the patient today. Risk, benefits, and alternatives to EP study and radiofrequency ablation for afib were also discussed in detail today. These risks include but are not limited to stroke, bleeding, vascular damage, tamponade, perforation, damage to the esophagus, lungs, and other structures, pulmonary vein stenosis, worsening renal function, and death. The patient understands these risk and wishes to proceed.  We will therefore proceed with catheter ablation at the next available time.  He will require TEE prior to the procedure.  He will also require close preoperative INR checks as his INRs are frequently supratherapeutic.  He is aware that if his INR is above 3.3 on the day of the procedure then I would not be able to perform his procedure (goal INR 2.5-3 for the procedure). I will repeat 2D echo at this time to evaluate LA size. Given recent RVR with afib, I have encouraged him to increase diltiazem to 180mg  BID prior to the procedure. He is also instructed to follow-up with Pulmonary for  management of sleep apnea.
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
## 2012-03-17 ENCOUNTER — Other Ambulatory Visit: Payer: Self-pay | Admitting: *Deleted

## 2012-03-17 ENCOUNTER — Encounter: Payer: Self-pay | Admitting: *Deleted

## 2012-03-17 DIAGNOSIS — I4891 Unspecified atrial fibrillation: Secondary | ICD-10-CM

## 2012-03-17 DIAGNOSIS — I4892 Unspecified atrial flutter: Secondary | ICD-10-CM

## 2012-03-21 ENCOUNTER — Telehealth: Payer: Self-pay | Admitting: Internal Medicine

## 2012-03-21 DIAGNOSIS — I4891 Unspecified atrial fibrillation: Secondary | ICD-10-CM

## 2012-03-21 MED ORDER — DILTIAZEM HCL ER BEADS 180 MG PO CP24: 180.0000 mg | ORAL_CAPSULE | Freq: Two times a day (BID) | ORAL | Status: DC

## 2012-03-21 NOTE — Telephone Encounter (Signed)
New msg Pt wants to discuss increasing one on his meds. Please call

## 2012-03-21 NOTE — Telephone Encounter (Signed)
Patient called Dr Johney Frame asked him to increase his Diltiazem and to have an echo  I will order both and he is expectng someone to call him

## 2012-03-23 ENCOUNTER — Encounter (HOSPITAL_COMMUNITY): Payer: Self-pay | Admitting: Respiratory Therapy

## 2012-03-29 ENCOUNTER — Ambulatory Visit (HOSPITAL_COMMUNITY): Payer: Self-pay | Attending: Cardiology | Admitting: Radiology

## 2012-03-29 ENCOUNTER — Ambulatory Visit (INDEPENDENT_AMBULATORY_CARE_PROVIDER_SITE_OTHER): Payer: Self-pay | Admitting: Pharmacist

## 2012-03-29 DIAGNOSIS — I079 Rheumatic tricuspid valve disease, unspecified: Secondary | ICD-10-CM | POA: Insufficient documentation

## 2012-03-29 DIAGNOSIS — R51 Headache: Secondary | ICD-10-CM | POA: Insufficient documentation

## 2012-03-29 DIAGNOSIS — Z954 Presence of other heart-valve replacement: Secondary | ICD-10-CM

## 2012-03-29 DIAGNOSIS — Z952 Presence of prosthetic heart valve: Secondary | ICD-10-CM

## 2012-03-29 DIAGNOSIS — I379 Nonrheumatic pulmonary valve disorder, unspecified: Secondary | ICD-10-CM | POA: Insufficient documentation

## 2012-03-29 DIAGNOSIS — I359 Nonrheumatic aortic valve disorder, unspecified: Secondary | ICD-10-CM | POA: Insufficient documentation

## 2012-03-29 DIAGNOSIS — Z7901 Long term (current) use of anticoagulants: Secondary | ICD-10-CM

## 2012-03-29 DIAGNOSIS — I4891 Unspecified atrial fibrillation: Secondary | ICD-10-CM

## 2012-03-29 LAB — POCT INR: INR: 3.4

## 2012-03-29 NOTE — Progress Notes (Signed)
Echocardiogram performed.  

## 2012-04-01 ENCOUNTER — Ambulatory Visit (INDEPENDENT_AMBULATORY_CARE_PROVIDER_SITE_OTHER): Payer: Self-pay

## 2012-04-01 ENCOUNTER — Other Ambulatory Visit (INDEPENDENT_AMBULATORY_CARE_PROVIDER_SITE_OTHER): Payer: Self-pay

## 2012-04-01 DIAGNOSIS — Z7901 Long term (current) use of anticoagulants: Secondary | ICD-10-CM

## 2012-04-01 DIAGNOSIS — Z954 Presence of other heart-valve replacement: Secondary | ICD-10-CM

## 2012-04-01 DIAGNOSIS — Z952 Presence of prosthetic heart valve: Secondary | ICD-10-CM

## 2012-04-01 DIAGNOSIS — I4891 Unspecified atrial fibrillation: Secondary | ICD-10-CM

## 2012-04-01 LAB — CBC WITH DIFFERENTIAL/PLATELET
Basophils Absolute: 0 10*3/uL (ref 0.0–0.1)
Basophils Relative: 0.7 % (ref 0.0–3.0)
Eosinophils Absolute: 0.1 10*3/uL (ref 0.0–0.7)
Eosinophils Relative: 1.1 % (ref 0.0–5.0)
HCT: 42.6 % (ref 39.0–52.0)
Hemoglobin: 14.3 g/dL (ref 13.0–17.0)
Lymphocytes Relative: 26.7 % (ref 12.0–46.0)
Lymphs Abs: 1.5 10*3/uL (ref 0.7–4.0)
MCHC: 33.7 g/dL (ref 30.0–36.0)
MCV: 90.6 fl (ref 78.0–100.0)
Monocytes Absolute: 0.5 10*3/uL (ref 0.1–1.0)
Monocytes Relative: 8.5 % (ref 3.0–12.0)
Neutro Abs: 3.6 10*3/uL (ref 1.4–7.7)
Neutrophils Relative %: 63 % (ref 43.0–77.0)
Platelets: 148 10*3/uL — ABNORMAL LOW (ref 150.0–400.0)
RBC: 4.7 Mil/uL (ref 4.22–5.81)
RDW: 13.2 % (ref 11.5–14.6)
WBC: 5.8 10*3/uL (ref 4.5–10.5)

## 2012-04-01 LAB — BASIC METABOLIC PANEL
BUN: 23 mg/dL (ref 6–23)
CO2: 28 mEq/L (ref 19–32)
Calcium: 9.5 mg/dL (ref 8.4–10.5)
Chloride: 102 mEq/L (ref 96–112)
Creatinine, Ser: 1 mg/dL (ref 0.4–1.5)
GFR: 81.65 mL/min (ref >60.00)
Glucose, Bld: 95 mg/dL (ref 70–99)
Potassium: 4.5 mEq/L (ref 3.5–5.1)
Sodium: 137 mEq/L (ref 135–145)

## 2012-04-01 LAB — POCT INR: INR: 4.1

## 2012-04-05 ENCOUNTER — Ambulatory Visit (INDEPENDENT_AMBULATORY_CARE_PROVIDER_SITE_OTHER): Payer: Self-pay | Admitting: *Deleted

## 2012-04-05 DIAGNOSIS — Z954 Presence of other heart-valve replacement: Secondary | ICD-10-CM

## 2012-04-05 DIAGNOSIS — Z7901 Long term (current) use of anticoagulants: Secondary | ICD-10-CM

## 2012-04-05 DIAGNOSIS — Z952 Presence of prosthetic heart valve: Secondary | ICD-10-CM

## 2012-04-05 DIAGNOSIS — I4891 Unspecified atrial fibrillation: Secondary | ICD-10-CM

## 2012-04-05 LAB — POCT INR: INR: 3.9

## 2012-04-06 ENCOUNTER — Encounter (HOSPITAL_COMMUNITY): Payer: Self-pay | Admitting: Adult Health

## 2012-04-06 ENCOUNTER — Emergency Department (HOSPITAL_COMMUNITY)
Admission: EM | Admit: 2012-04-06 | Discharge: 2012-04-06 | Disposition: A | Discharge status: Home / Self Care | Payer: Self-pay | Attending: Emergency Medicine | Admitting: Emergency Medicine

## 2012-04-06 ENCOUNTER — Emergency Department (HOSPITAL_COMMUNITY): Payer: Self-pay

## 2012-04-06 DIAGNOSIS — I10 Essential (primary) hypertension: Secondary | ICD-10-CM | POA: Insufficient documentation

## 2012-04-06 DIAGNOSIS — Z7982 Long term (current) use of aspirin: Secondary | ICD-10-CM | POA: Insufficient documentation

## 2012-04-06 DIAGNOSIS — Z7901 Long term (current) use of anticoagulants: Secondary | ICD-10-CM | POA: Insufficient documentation

## 2012-04-06 DIAGNOSIS — G4733 Obstructive sleep apnea (adult) (pediatric): Secondary | ICD-10-CM | POA: Insufficient documentation

## 2012-04-06 DIAGNOSIS — Z79899 Other long term (current) drug therapy: Secondary | ICD-10-CM | POA: Insufficient documentation

## 2012-04-06 DIAGNOSIS — I4891 Unspecified atrial fibrillation: Secondary | ICD-10-CM

## 2012-04-06 LAB — CBC
HCT: 41.9 % (ref 39.0–52.0)
Hemoglobin: 14.8 g/dL (ref 13.0–17.0)
MCH: 30.8 pg (ref 26.0–34.0)
MCHC: 35.3 g/dL (ref 30.0–36.0)
MCV: 87.1 fL (ref 78.0–100.0)
Platelets: 175 10*3/uL (ref 150–400)
RBC: 4.81 MIL/uL (ref 4.22–5.81)
RDW: 12.7 % (ref 11.5–15.5)
WBC: 8.6 10*3/uL (ref 4.0–10.5)

## 2012-04-06 LAB — COMPREHENSIVE METABOLIC PANEL
ALT: 38 U/L (ref 0–53)
AST: 33 U/L (ref 0–37)
Albumin: 4.5 g/dL (ref 3.5–5.2)
Alkaline Phosphatase: 80 U/L (ref 39–117)
BUN: 19 mg/dL (ref 6–23)
CO2: 22 mEq/L (ref 19–32)
Calcium: 9.6 mg/dL (ref 8.4–10.5)
Chloride: 104 mEq/L (ref 96–112)
Creatinine, Ser: 1.05 mg/dL (ref 0.50–1.35)
GFR calc Af Amer: >90 mL/min (ref >90)
GFR calc non Af Amer: 82 mL/min — ABNORMAL LOW (ref >90)
Glucose, Bld: 112 mg/dL — ABNORMAL HIGH (ref 70–99)
Potassium: 4.2 mEq/L (ref 3.5–5.1)
Sodium: 137 mEq/L (ref 135–145)
Total Bilirubin: 1.3 mg/dL — ABNORMAL HIGH (ref 0.3–1.2)
Total Protein: 7.9 g/dL (ref 6.0–8.3)

## 2012-04-06 LAB — PROTIME-INR
INR: 2.09 — ABNORMAL HIGH (ref 0.00–1.49)
Prothrombin Time: 23.8 seconds — ABNORMAL HIGH (ref 11.6–15.2)

## 2012-04-06 LAB — POCT I-STAT TROPONIN I: Troponin i, poc: 0 ng/mL (ref 0.00–0.08)

## 2012-04-06 LAB — APTT: aPTT: 35 seconds (ref 24–37)

## 2012-04-06 NOTE — ED Notes (Signed)
Reports being seen at [facility] in [place] today for rapid heart rate and atrial fibrillation, checked himself out due to schedule for an ablation tomorrow by Dr. Johney Frame. Given DIltiazem by [facility]. Still feeling like hr is jumping around, Our EKG shows NSR, with T wave inversion. Denies SOB at this time, denies nausea at this time. Pt takes coumadin.
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
## 2012-04-06 NOTE — ED Notes (Signed)
Pt. Seen earlier today for atrial fibrillation at [facility], was given diltiazem and converted back into sinus rhythm. Pt. States "I checked out AMA  At [facility] because I have a cardiac ablation here scheduled for tomorrow morning". Pt came here to get checked out before his ablation tomorrow due to episode of afib. Pt. Currently in sinus rhythm.

## 2012-04-07 ENCOUNTER — Ambulatory Visit (HOSPITAL_COMMUNITY): Payer: Self-pay | Admitting: Anesthesiology

## 2012-04-07 ENCOUNTER — Encounter (HOSPITAL_COMMUNITY): Payer: Self-pay | Admitting: Anesthesiology

## 2012-04-07 ENCOUNTER — Encounter (HOSPITAL_COMMUNITY): Payer: Self-pay | Admitting: *Deleted

## 2012-04-07 ENCOUNTER — Ambulatory Visit (HOSPITAL_COMMUNITY)
Admission: RE | Admit: 2012-04-07 | Discharge: 2012-04-08 | Disposition: A | Discharge status: Home / Self Care | Payer: Self-pay | Source: Ambulatory Visit | Attending: Internal Medicine | Admitting: Internal Medicine

## 2012-04-07 ENCOUNTER — Encounter (HOSPITAL_COMMUNITY)
Admission: RE | Disposition: A | Discharge status: Home / Self Care | Payer: Self-pay | Source: Ambulatory Visit | Attending: Internal Medicine

## 2012-04-07 ENCOUNTER — Ambulatory Visit (INDEPENDENT_AMBULATORY_CARE_PROVIDER_SITE_OTHER): Payer: Self-pay | Admitting: *Deleted

## 2012-04-07 DIAGNOSIS — I4891 Unspecified atrial fibrillation: Secondary | ICD-10-CM

## 2012-04-07 DIAGNOSIS — Z954 Presence of other heart-valve replacement: Secondary | ICD-10-CM

## 2012-04-07 DIAGNOSIS — I712 Thoracic aortic aneurysm, without rupture, unspecified: Secondary | ICD-10-CM | POA: Insufficient documentation

## 2012-04-07 DIAGNOSIS — G4733 Obstructive sleep apnea (adult) (pediatric): Secondary | ICD-10-CM | POA: Insufficient documentation

## 2012-04-07 DIAGNOSIS — Z7901 Long term (current) use of anticoagulants: Secondary | ICD-10-CM

## 2012-04-07 DIAGNOSIS — I10 Essential (primary) hypertension: Secondary | ICD-10-CM | POA: Insufficient documentation

## 2012-04-07 DIAGNOSIS — Z952 Presence of prosthetic heart valve: Secondary | ICD-10-CM

## 2012-04-07 HISTORY — PX: TEE WITHOUT CARDIOVERSION: SHX5443

## 2012-04-07 HISTORY — PX: ATRIAL FIBRILLATION ABLATION: SHX5456

## 2012-04-07 LAB — MRSA PCR SCREENING: MRSA by PCR: POSITIVE — AB

## 2012-04-07 LAB — POCT ACTIVATED CLOTTING TIME
Activated Clotting Time: 214 seconds
Activated Clotting Time: 254 seconds
Activated Clotting Time: 179 seconds
Activated Clotting Time: 269 seconds

## 2012-04-07 LAB — POCT INR: INR: 1.9

## 2012-04-07 SURGERY — ATRIAL FIBRILLATION ABLATION
Anesthesia: Monitor Anesthesia Care

## 2012-04-07 SURGERY — ECHOCARDIOGRAM, TRANSESOPHAGEAL
Anesthesia: Moderate Sedation

## 2012-04-07 MED ORDER — FENTANYL CITRATE 0.05 MG/ML IJ SOLN
INTRAMUSCULAR | Status: DC | PRN
  Administered 2012-04-07: 50 ug via INTRAVENOUS
  Administered 2012-04-07: 25 ug via INTRAVENOUS

## 2012-04-07 MED ORDER — HEPARIN SODIUM (PORCINE) 1000 UNIT/ML IJ SOLN
INTRAMUSCULAR | Status: AC
  Filled 2012-04-07: qty 1

## 2012-04-07 MED ORDER — WARFARIN SODIUM 10 MG PO TABS
20.0000 mg | ORAL_TABLET | Freq: Once | ORAL | Status: AC
  Administered 2012-04-07: 20 mg via ORAL
  Filled 2012-04-07: qty 2

## 2012-04-07 MED ORDER — SODIUM CHLORIDE 0.45 % IV SOLN
INTRAVENOUS | Status: DC
  Administered 2012-04-07: 09:00:00 via INTRAVENOUS

## 2012-04-07 MED ORDER — HYDROXYUREA 500 MG PO CAPS
ORAL_CAPSULE | ORAL | Status: AC
  Filled 2012-04-07: qty 1

## 2012-04-07 MED ORDER — SODIUM CHLORIDE 0.9 % IV SOLN
250.0000 mL | INTRAVENOUS | Status: DC | PRN
Start: 2012-04-07 — End: 2012-04-07

## 2012-04-07 MED ORDER — WARFARIN - PHYSICIAN DOSING INPATIENT: Freq: Every day | Status: DC

## 2012-04-07 MED ORDER — LIDOCAINE HCL (CARDIAC) 20 MG/ML IV SOLN
INTRAVENOUS | Status: DC | PRN
  Administered 2012-04-07: 50 mg via INTRAVENOUS

## 2012-04-07 MED ORDER — SODIUM CHLORIDE 0.9 % IV SOLN: 250.0000 mL | INTRAVENOUS | Status: DC | PRN

## 2012-04-07 MED ORDER — CHLORHEXIDINE GLUCONATE CLOTH 2 % EX PADS
6.0000 | MEDICATED_PAD | Freq: Every day | CUTANEOUS | Status: DC
  Administered 2012-04-08: 6 via TOPICAL

## 2012-04-07 MED ORDER — SODIUM CHLORIDE 0.9 % IJ SOLN
3.0000 mL | Freq: Two times a day (BID) | INTRAMUSCULAR | Status: DC
  Administered 2012-04-07 – 2012-04-08 (×2): 3 mL via INTRAVENOUS

## 2012-04-07 MED ORDER — ASPIRIN EC 81 MG PO TBEC
81.0000 mg | DELAYED_RELEASE_TABLET | Freq: Every day | ORAL | Status: DC
  Administered 2012-04-07 – 2012-04-08 (×2): 81 mg via ORAL
  Filled 2012-04-07 (×2): qty 1

## 2012-04-07 MED ORDER — MIDAZOLAM HCL 10 MG/2ML IJ SOLN
INTRAMUSCULAR | Status: AC
  Filled 2012-04-07: qty 2

## 2012-04-07 MED ORDER — MUPIROCIN 2 % EX OINT
1.0000 "application " | TOPICAL_OINTMENT | Freq: Two times a day (BID) | CUTANEOUS | Status: DC
  Administered 2012-04-07 – 2012-04-08 (×2): 1 via NASAL
  Filled 2012-04-07: qty 22

## 2012-04-07 MED ORDER — PROMETHAZINE HCL 25 MG/ML IJ SOLN
INTRAMUSCULAR | Status: AC
  Filled 2012-04-07: qty 1

## 2012-04-07 MED ORDER — ACETAMINOPHEN 325 MG PO TABS
650.0000 mg | ORAL_TABLET | ORAL | Status: DC | PRN
  Administered 2012-04-07: 650 mg via ORAL
  Filled 2012-04-07: qty 2

## 2012-04-07 MED ORDER — PROMETHAZINE HCL 25 MG/ML IJ SOLN
12.5000 mg | Freq: Once | INTRAMUSCULAR | Status: AC
  Administered 2012-04-07: 12.5 mg via INTRAVENOUS

## 2012-04-07 MED ORDER — PROPOFOL 10 MG/ML IV EMUL
INTRAVENOUS | Status: DC | PRN
  Administered 2012-04-07: 20 mg via INTRAVENOUS

## 2012-04-07 MED ORDER — ONDANSETRON HCL 4 MG/2ML IJ SOLN
4.0000 mg | Freq: Once | INTRAMUSCULAR | Status: AC
  Administered 2012-04-07: 4 mg via INTRAVENOUS

## 2012-04-07 MED ORDER — SODIUM CHLORIDE 0.9 % IJ SOLN: 3.0000 mL | INTRAMUSCULAR | Status: DC | PRN

## 2012-04-07 MED ORDER — SODIUM CHLORIDE 0.9 % IJ SOLN: 3.0000 mL | Freq: Two times a day (BID) | INTRAMUSCULAR | Status: DC

## 2012-04-07 MED ORDER — ONDANSETRON HCL 4 MG/2ML IJ SOLN
INTRAMUSCULAR | Status: AC
  Filled 2012-04-07: qty 2

## 2012-04-07 MED ORDER — DOFETILIDE 500 MCG PO CAPS
500.0000 ug | ORAL_CAPSULE | Freq: Two times a day (BID) | ORAL | Status: DC
  Administered 2012-04-07 – 2012-04-08 (×2): 500 ug via ORAL
  Filled 2012-04-07 (×4): qty 1

## 2012-04-07 MED ORDER — BUPIVACAINE HCL (PF) 0.25 % IJ SOLN
INTRAMUSCULAR | Status: AC
  Filled 2012-04-07: qty 60

## 2012-04-07 MED ORDER — LISINOPRIL 20 MG PO TABS
20.0000 mg | ORAL_TABLET | Freq: Every day | ORAL | Status: DC
  Administered 2012-04-07 – 2012-04-08 (×2): 20 mg via ORAL
  Filled 2012-04-07 (×2): qty 1

## 2012-04-07 MED ORDER — ONDANSETRON HCL 4 MG/2ML IJ SOLN: 4.0000 mg | Freq: Four times a day (QID) | INTRAMUSCULAR | Status: DC | PRN

## 2012-04-07 MED ORDER — PROTAMINE SULFATE 10 MG/ML IV SOLN
INTRAVENOUS | Status: DC | PRN
  Administered 2012-04-07: 30 mg via INTRAVENOUS

## 2012-04-07 MED ORDER — PROPOFOL 10 MG/ML IV EMUL
INTRAVENOUS | Status: DC | PRN
  Administered 2012-04-07: 50 ug/kg/min via INTRAVENOUS

## 2012-04-07 MED ORDER — MIDAZOLAM HCL 10 MG/2ML IJ SOLN
INTRAMUSCULAR | Status: DC | PRN
  Administered 2012-04-07: 2 mg via INTRAVENOUS
  Administered 2012-04-07: 3 mg via INTRAVENOUS
  Administered 2012-04-07: 4 mg via INTRAVENOUS

## 2012-04-07 MED ORDER — DEXTROSE 5 % IV SOLN
INTRAVENOUS | Status: AC
  Filled 2012-04-07: qty 250

## 2012-04-07 MED ORDER — HEPARIN SODIUM (PORCINE) 1000 UNIT/ML IJ SOLN
INTRAMUSCULAR | Status: DC | PRN
  Administered 2012-04-07: 4000 [IU] via INTRAVENOUS
  Administered 2012-04-07: 5000 [IU] via INTRAVENOUS

## 2012-04-07 MED ORDER — MIDAZOLAM HCL 5 MG/5ML IJ SOLN
INTRAMUSCULAR | Status: DC | PRN
  Administered 2012-04-07: 2 mg via INTRAVENOUS

## 2012-04-07 MED ORDER — FENTANYL CITRATE 0.05 MG/ML IJ SOLN
INTRAMUSCULAR | Status: AC
  Filled 2012-04-07: qty 2

## 2012-04-07 MED ORDER — SODIUM CHLORIDE 0.9 % IV SOLN
INTRAVENOUS | Status: DC | PRN
  Administered 2012-04-07: 12:00:00 via INTRAVENOUS

## 2012-04-07 MED ORDER — FENTANYL CITRATE 0.05 MG/ML IJ SOLN
INTRAMUSCULAR | Status: DC | PRN
  Administered 2012-04-07 (×3): 25 ug via INTRAVENOUS
  Administered 2012-04-07: 100 ug via INTRAVENOUS
  Administered 2012-04-07: 25 ug via INTRAVENOUS
  Administered 2012-04-07: 50 ug via INTRAVENOUS

## 2012-04-07 NOTE — Anesthesia Preprocedure Evaluation (Addendum)
Anesthesia Evaluation  Patient identified by MRN, date of birth, ID band Patient awake    Reviewed: Allergy & Precautions, H&P , NPO status , Patient's Chart, lab work & pertinent test results, reviewed documented beta blocker date and time   Airway Mallampati: II TM Distance: >3 FB Neck ROM: Full    Dental  (+) Teeth Intact and Dental Advisory Given   Pulmonary sleep apnea ,  breath sounds clear to auscultation        Cardiovascular hypertension, Pt. on medications + dysrhythmias Atrial Fibrillation + Valvular Problems/Murmurs AS Rhythm:Irregular Rate:Normal  S/P AVR for AS   Neuro/Psych  Headaches,    GI/Hepatic (+) Hepatitis -, A  Endo/Other    Renal/GU      Musculoskeletal   Abdominal (+) + obese,   Peds  Hematology   Anesthesia Other Findings   Reproductive/Obstetrics                          Anesthesia Physical Anesthesia Plan  ASA: III  Anesthesia Plan: MAC   Post-op Pain Management:    Induction: Intravenous  Airway Management Planned: Simple Face Mask  Additional Equipment:   Intra-op Plan:   Post-operative Plan:   Informed Consent: I have reviewed the patients History and Physical, chart, labs and discussed the procedure including the risks, benefits and alternatives for the proposed anesthesia with the patient or authorized representative who has indicated his/her understanding and acceptance.   Dental advisory given  Plan Discussed with: CRNA and Surgeon  Anesthesia Plan Comments:        Anesthesia Quick Evaluation

## 2012-04-07 NOTE — Anesthesia Postprocedure Evaluation (Signed)
  Anesthesia Post-op Note  Patient: Mark [name]  Procedure(s) Performed: Procedure(s) (LRB): ATRIAL FIBRILLATION ABLATION (N/A)  Patient Location: PACU and Cath Lab  Anesthesia Type: MAC  Level of Consciousness: awake and alert   Airway and Oxygen Therapy: Patient Spontanous Breathing  Post-op Pain: none  Post-op Assessment: Post-op Vital signs reviewed, Patient's Cardiovascular Status Stable, Respiratory Function Stable, Patent Airway, No signs of Nausea or vomiting and Pain level controlled  Post-op Vital Signs: stable  Complications: No apparent anesthesia complications

## 2012-04-07 NOTE — CV Procedure (Addendum)
    TRANSESOPHAGEAL ECHOCARDIOGRAM   NAME:  Jock Mahon   MRN: [id number] DOB:  05/26/1961   ADMIT DATE: 04/07/2012  INDICATIONS:  AF (pre-ablation)   PROCEDURE:   Informed consent was obtained prior to the procedure. The risks, benefits and alternatives for the procedure were discussed and the patient comprehended these risks.  Risks include, but are not limited to, cough, sore throat, vomiting, nausea, somnolence, esophageal and stomach trauma or perforation, bleeding, low blood pressure, aspiration, pneumonia, infection, trauma to the teeth and death.    After a procedural time-out, the patient was given 9 mg versed and 75 mcg fentanyl for moderate sedation.  The oropharynx was anesthetized 10 cc of topical 1% viscous lidocaine.  The transesophageal probe was inserted in the esophagus and stomach without difficulty and multiple views were obtained.   COMPLICATIONS:    There were no immediate complications.  FINDINGS:  LEFT VENTRICLE: EF = 60%. No regional wall motion abnormalities.  RIGHT VENTRICLE: Normal   LEFT ATRIUM: Dilated. Diameter 5.5 cm  LEFT ATRIAL APPENDAGE: No clot  RIGHT ATRIUM: Normal. + [employer]  AORTIC VALVE:  Mechanical prosthesis. Calcified. Not well visualized. Unable to line-up jet well enough to quantify AS. Trivial AI.   AORTIC ROOT: Normal  ASCENDING AORTA: Mildly dilated.  4.1 cm  MITRAL VALVE:    Normal. Trivial MR  TRICUSPID VALVE: Normal. No TR.  PULMONIC VALVE: No well visualized.  INTERATRIAL SEPTUM: + atrial septal aneurysm. No obvious PFO or ASD.  PERICARDIUM: Small effusion  DESCENDING AORTA: Mild plaque
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
## 2012-04-07 NOTE — Progress Notes (Signed)
  Echocardiogram Echocardiogram Transesophageal has been performed.  Mark [name] 04/07/2012, 11:26 AM

## 2012-04-07 NOTE — Interval H&P Note (Signed)
History and Physical Interval Note:  04/07/2012 12:19 PM  Mark [name]  has presented today for surgery, with the diagnosis of A Fib  The various methods of treatment have been discussed with the patient and family. After consideration of risks, benefits and other options for treatment, the patient has consented to  Procedure(s) (LRB): ATRIAL FIBRILLATION ABLATION (N/A) as a surgical intervention .  The patient's history has been reviewed, patient examined, no change in status, stable for surgery.  I have reviewed the patient's chart and labs.  Questions were answered to the patient's satisfaction.     Hillis Range

## 2012-04-07 NOTE — Op Note (Signed)
SURGEON:  Hillis Range, MD  PREPROCEDURE DIAGNOSES: 1. Paroxysmal atrial fibrillation.  POSTPROCEDURE DIAGNOSES: 1. Paroxysmal  atrial fibrillation.  PROCEDURES: 1. Comprehensive electrophysiologic study. 2. Coronary sinus pacing and recording. 3. Three-dimensional mapping of atrial fibrillation with additional right atrial mapping of a second focus 4. Ablation of atrial fibrillation with additional right atrial ablation of a second focus 5.  Intracardiac echocardiography. 7. Transseptal puncture of an intact septum. 8. Rotational Angiography with processing at an independent workstation 9. Arrhythmia induction with pacing with isuprel infusion  INTRODUCTION:  Mark [name] is a 51 y.o. male with a history of paroxysmal atrial fibrillation and prior aortic valve replacement who now presents for EP study and radiofrequency ablation.  The patient reports initially being diagnosed with atrial fibrillation several years ago after presenting with symptomatic palpitations and fatgiue. The patient reports increasing frequency and duration of atrial fibrillation since that time.  The patient has failed medical therapy with Joice Lofts.  The patient therefore presents today for catheter ablation of atrial fibrillation.  DESCRIPTION OF PROCEDURE:  Informed written consent was obtained, and the patient was brought to the electrophysiology lab in a fasting state.  The patient was adequately sedated with intravenous medications as outlined in the anesthesia report.  The patient's left and right groins were prepped and draped in the usual sterile fashion by the EP lab staff.  Using a percutaneous Seldinger technique, two 7-French and one 11-French hemostasis sheaths were placed into the right common femoral vein.   3 Dimensional Rotational Angiography: A 5 french pigtail catheter was introduced through the right common femoral vein and advanced into the inferior venocava.  3 demential rotational  angiography was then performed by power injection of 100cc of nonionic contrast.  Reprocessing at an independent work station was then performed.   This demonstrated a moderate sized left atrium with 4 separate pulmonary veins which were also moderate in size.  There were no anomalous veins or significant abnormalities.  A 3 dimensional rendering of the left atrium was then merged using [employer] onto the Well[employer] system and registered with intracardiac echo (see below).  The pigtail catheter was then removed.  Catheter Placement:  A 7-French Biosense Webster Decapolar coronary sinus catheter was introduced through the right common femoral vein and advanced into the coronary sinus for recording and pacing from this location.  A 6-French quadripolar Josephson catheter was introduced through the right common femoral vein and advanced into the right ventricle for recording and pacing.  This catheter was then pulled back to the His bundle location.    Initial Measurements: The patient presented to the electrophysiology lab in sinus rhythm.  His PR interval measured with a QRS duringation of 126 and a QT interval of 430 msec.  The AH interval measured 80 and the HV interval measured 40 msec.     Intracardiac Echocardiography: A 10-French Biosense Webster AcuNav intracardiac echocardiography catheter was introduced through the left common femoral vein and advanced into the right atrium. Intracardiac echocardiography was performed of the left atrium, and a three-dimensional anatomical rendering of the left atrium was performed using CARTO sound technology.  The patient was noted to have a moderate sized left atrium.  The interatrial septum was prominent but not aneurysmal. All 4 pulmonary veins were visualized and noted to have separate ostia.  The pulmonary veins were moderate in size.  The left atrial appendage was visualized and did not reveal thrombus.   There was no evidence of  pulmonary vein  stenosis.   Transseptal Puncture: The middle right common femoral vein sheath was exchanged for an 8.5 [place] SL2 transseptal sheath and transseptal access was achieved in a standard fashion using a Brockenbrough needle under biplane fluoroscopy with intracardiac echocardiography confirmation of the transseptal puncture.  Once transseptal access had been achieved, heparin was administered intravenously and intra- arterially in order to maintain an ACT of greater than 300 seconds throughout the procedure.   3D Mapping and Ablation: The His bundle catheter was removed and in its place a 3.5 mm Biosense Webster EZ [employer] ablation catheter was advanced into the right atrium.  The transseptal sheath was pulled back into the IVC over a guidewire.  The ablation catheter was advanced across the transseptal hole using the wire as a guide.  The transseptal sheath was then re-advanced over the guidewire into the left atrium.  A duodecapolar Biosense Webster circular mapping catheter was introduced through the transseptal sheath and positioned over the mouth of all 4 pulmonary veins.  Three-dimensional electroanatomical mapping was performed using CARTO technology.  This demonstrated electrical activity within all four pulmonary veins at baseline. The patient underwent successful sequential electrical isolation and anatomical encircling of all four pulmonary veins using radiofrequency current with a circular mapping catheter as a guide.   Additional ablation: The circular mapping catheter was pulled back into the right atrium and 3D mapping was performed at the junction of the superior vena cava and right atrium.  Electrical activity was observed within the SVC.  I therefore elected to perform right atrial ablation in this area.  A series of radiofrequency applications were delivered in a circular fashion around the ostium of the SVC.  Prior to each ablation lesions, pacing was performed from  the distal ablation electrode to insure that diaphragmatic stimulation was not observed to avoid phrenic nerve injury.  Diaphragmatic excursion was also observed during ablation.    Measurements Following Ablation: Following ablation, Isuprel was infused up to 20 mcg/min with no inducible atrial fibrillation, atrial tachycardia, atrial flutter, or sustained PACs. In sinus rhythm with RR interval was 1034, with PR , QRS 126 msec, and Qtc 418 msec.  Following ablation the AH interval measured with an HV interval of 50 msec. Ventricular pacing was performed, which revealed midline decremental VA conduction with a VA Wenckebach cycle length of490 msec.  Rapid atrial pacing was performed, which revealed an AV Wenckebach cycle length of 450 msec.  Electroisolation was then again confirmed in all four pulmonary veins.  Intracardiac echocardiography was again performed, which revealed no pericardial effusion.  The procedure was therefore considered completed.  All catheters were removed, and the sheaths were aspirated and flushed.  The patient was transferred to the recovery area for sheath removal per protocol.  A limited bedside transthoracic echocardiogram revealed no pericardial effusion.  There were no early apparent complications.  CONCLUSIONS: 1. Sinus rhythm upon presentation.   2. Rotational Angiography reveals a moderate sized left atrium with four separate pulmonary veins without evidence of pulmonary vein stenosis. 3. Successful electrical isolation and anatomical encircling of all four pulmonary veins with radiofrequency current.  Additional ablation was performed with electrical encircling of the superior vena cava. 4. No inducible arrhythmias following ablation both on and off of Isuprel 5. No early apparent complications.   Mark Spallone,MD 3:02 PM 04/07/2012

## 2012-04-07 NOTE — OR Nursing (Signed)
Meds given in room: 9mg  verserd/72mcg fentanyl Meds wasted (pyxis down): 1mg  versed[facility]59mcg [facility] DStuckeyRN

## 2012-04-07 NOTE — Interval H&P Note (Signed)
History and Physical Interval Note:  04/07/2012 10:08 AM  Mark [name]  has presented today for surgery, with the diagnosis of a fib  The various methods of treatment have been discussed with the patient and family. After consideration of risks, benefits and other options for treatment, the patient has consented to  Procedure(s) (LRB): TRANSESOPHAGEAL ECHOCARDIOGRAM (TEE) (N/A) as a surgical intervention .  The patient's history has been reviewed, patient examined, no change in status, stable for surgery.  I have reviewed the patient's chart and labs.  Questions were answered to the patient's satisfaction.     [name]

## 2012-04-07 NOTE — Preoperative (Signed)
Beta Blockers   Reason not to administer Beta Blockers:Not Applicable. No home beta blockers 

## 2012-04-07 NOTE — Transfer of Care (Signed)
Immediate Anesthesia Transfer of Care Note  Patient: Mark [name]  Procedure(s) Performed: Procedure(s) (LRB): ATRIAL FIBRILLATION ABLATION (N/A)  Patient Location: PACU and Cath Lab  Anesthesia Type: MAC  Level of Consciousness: awake, alert  and oriented  Airway & Oxygen Therapy: Patient Spontanous Breathing and Patient connected to nasal cannula oxygen  Post-op Assessment: Report given to PACU RN and Post -op Vital signs reviewed and stable  Post vital signs: Reviewed and stable  Complications: No apparent anesthesia complications

## 2012-04-08 ENCOUNTER — Encounter (HOSPITAL_COMMUNITY): Payer: Self-pay | Admitting: Internal Medicine

## 2012-04-08 ENCOUNTER — Encounter (HOSPITAL_COMMUNITY): Payer: Self-pay

## 2012-04-08 ENCOUNTER — Emergency Department (HOSPITAL_COMMUNITY)
Admission: EM | Admit: 2012-04-08 | Discharge: 2012-04-08 | Disposition: A | Discharge status: Home / Self Care | Payer: Self-pay | Attending: Emergency Medicine | Admitting: Emergency Medicine

## 2012-04-08 DIAGNOSIS — I4891 Unspecified atrial fibrillation: Secondary | ICD-10-CM | POA: Insufficient documentation

## 2012-04-08 LAB — BASIC METABOLIC PANEL
BUN: 18 mg/dL (ref 6–23)
CO2: 26 mEq/L (ref 19–32)
Calcium: 8.7 mg/dL (ref 8.4–10.5)
Chloride: 108 mEq/L (ref 96–112)
Creatinine, Ser: 1.03 mg/dL (ref 0.50–1.35)
GFR calc Af Amer: >90 mL/min (ref >90)
GFR calc non Af Amer: 84 mL/min — ABNORMAL LOW (ref >90)
Glucose, Bld: 117 mg/dL — ABNORMAL HIGH (ref 70–99)
Potassium: 4.3 mEq/L (ref 3.5–5.1)
Sodium: 140 mEq/L (ref 135–145)

## 2012-04-08 LAB — PROTIME-INR
INR: 1.83 — ABNORMAL HIGH (ref 0.00–1.49)
Prothrombin Time: 21.5 seconds — ABNORMAL HIGH (ref 11.6–15.2)

## 2012-04-08 MED ORDER — DILTIAZEM HCL ER BEADS 180 MG PO CP24: 180.0000 mg | ORAL_CAPSULE | Freq: Every day | ORAL | Status: DC

## 2012-04-08 MED ORDER — ENOXAPARIN SODIUM 80 MG/0.8ML ~~LOC~~ SOLN
70.0000 mg | Freq: Once | SUBCUTANEOUS | Status: AC
  Administered 2012-04-08: 70 mg via SUBCUTANEOUS
  Filled 2012-04-08: qty 0.8

## 2012-04-08 MED ORDER — PANTOPRAZOLE SODIUM 40 MG PO TBEC: 40.0000 mg | DELAYED_RELEASE_TABLET | Freq: Every day | ORAL | Status: DC

## 2012-04-08 MED ORDER — DOFETILIDE 500 MCG PO CAPS: 500.0000 ug | ORAL_CAPSULE | Freq: Two times a day (BID) | ORAL | Status: DC

## 2012-04-08 NOTE — ED Notes (Signed)
Pt just discharged and when leaving in car sts went back into afib so came here to be seen; pt had  Cardiac ablation yesterday for afib; pt was checking in and sts bent too far forward and hit head on counter; pt with small laceration noted; bleeding controlled; pt sts generalized weakness

## 2012-04-08 NOTE — Progress Notes (Signed)
Pt discharged to home.Instructions given pt verbalized understanding. Departed floor via wheelchair with family

## 2012-04-08 NOTE — ED Notes (Signed)
Called to see patient in [facility] ED triage.   Mark [name] was discharged this AM s/p AFib ablation yesterday with Dr. Johney Frame. He was discharged in stable condition. He was continued on Tikosyn and diltiazem in addition to warfarin. While riding home in the car, he felt palpitations similar to his previous with AFib and became anxious/concerned. He reports his symptoms were less severe than they were prior to his ablation yesterday but he didn't expect to have recurrence so soon after his ablation. He denies CP, SOB, palpitations or dizziness. His 12-lead ECG shows atrial fibrillation at 106 bpm with T wave inversions inferiorly which are similar to previous/old 12-lead ECGs. His vitals are stable. His rate improved during my interview and exam. On exam, he appears well and in no distress. His heart rhythm is irregular but not tachycardic, mechanical valve sounds noted. His lungs are clear. He was provided reassurance regarding his AFib and I explained this is not uncommon after AFib ablation. It is expected that he will have some recurrence of AFib during recovery but we hope these episodes are brief, rate controlled and well tolerated. He is stable for discharge home. He was instructed to call our office if he or his wife have any further concerns or questions. He will continue his medications as directed by Dr. Johney Frame and return for outpatient follow-up as scheduled in 4-6 weeks. Mark [name] and his wife expressed verbal understanding and agree with this plan of care.

## 2012-04-08 NOTE — ED Notes (Signed)
Pt seen by cards in triage and discharged; pt did not want to be seen for small abrasion to head; charge RN notified

## 2012-04-08 NOTE — Discharge Summary (Signed)
ELECTROPHYSIOLOGY PROCEDURE DISCHARGE SUMMARY    Patient ID: Mark [name],  MRN: [id number], DOB/AGE: September 05, 1960 51 y.o.  Admit date: 04/07/2012 Discharge date: 04/08/2012  Primary Care Physician: Dwain Sarna, MD Primary Cardiologist: Nicholes Mango, MD Electrophysiologist: Hillis Range, MD  Primary Discharge Diagnosis:  Atrial fibrillation status post ablation this admission  Secondary Discharge Diagnosis:  1. AAA 2. Aortic stenosis- status post AVR 1983 3. Obstructive sleep apnea 4. Hypertension 5. Hepatitis A as a child   Procedures This Admission:  1.  Electrophysiology study and radiofrequency catheter ablation of atrial fibrillation on 04-07-2012 by Dr Johney Frame.  This demonstrated sinus rhythm upon presentation, rotational Angiography reveals a moderate sized left atrium with four separate pulmonary veins without evidence of pulmonary vein stenosis, successful electrical isolation and anatomical encircling of all four pulmonary veins with radiofrequency current, additional ablation was performed with electrical encircling of the superior vena cava. There were no inducible arrhythmias following ablation both on and off of Isuprel and no early apparent complications.  Brief HPI: Mark [name] is a 51 y.o. male with a history of paroxysmal atrial fibrillation and prior aortic valve replacement who now presents for EP study and radiofrequency ablation. The patient reports initially being diagnosed with atrial fibrillation several years ago after presenting  with symptomatic palpitations and fatgiue. The patient reports increasing frequency and duration of atrial fibrillation since that time. The patient has failed medical therapy with Joice Lofts. The patient therefore presents today for catheter ablation of atrial fibrillation.  Hospital Course:  The patient was admitted and underwent ablation of atrial fibrillation on 04-07-2012 with details as outlined above.  He was  monitored on telemetry overnight which demonstrated sinus bradycardia.  His groin incision was without complication. Because his INR was subtherapeutic on the day of discharge, he was given one dose of Lovenox prior to discharge.  Dr Johney Frame examined the patient on 04-08-2012 and considered him stable for discharge.   Discharge Vitals: Blood pressure 107/74, pulse 56, temperature 98.6 F (37 C), temperature source Oral, resp. rate 13, height 5\' 10"  (1.778 m), weight 231 lb (104.781 kg), SpO2 95.00%.    Physical Exam: Filed Vitals:   04/07/12 2327 04/08/12 0402 04/08/12 0404 04/08/12 0822  BP: 97/54  109/49 107/74  Pulse:    56  Temp:  98.1 F (36.7 C)  98.6 F (37 C)  TempSrc:  Oral  Oral  Resp:      Height:      Weight:      SpO2:  94%  95%    GEN- The patient is well appearing, alert and oriented x 3 today.   Head- normocephalic, atraumatic Eyes-  Sclera clear, conjunctiva pink Ears- hearing intact Oropharynx- clear Neck- supple, no JVP Lymph- no cervical lymphadenopathy Lungs- Clear to ausculation bilaterally, normal work of breathing Heart- Regular rate and rhythm, no murmurs, rubs or gallops, PMI not laterally displaced GI- soft, NT, ND, + BS Extremities- no clubbing, cyanosis, or edema MS- no significant deformity or atrophy Skin- no rash or lesion Psych- euthymic mood, full affect Neuro- strength and sensation are intact  Labs:   Lab Results  Component Value Date   WBC 8.6 04/06/2012   HGB 14.8 04/06/2012   HCT 41.9 04/06/2012   MCV 87.1 04/06/2012   PLT 175 04/06/2012     Lab 04/08/12 0500 04/06/12 1801  NA 140 --  K 4.3 --  CL 108 --  CO2 26 --  BUN 18 --  CREATININE 1.03 --  CALCIUM 8.7 --  PROT -- 7.9  BILITOT -- 1.3*  ALKPHOS -- 80  ALT -- 38  AST -- 33  GLUCOSE 117* --   INR- 1.83  Discharge Medications:  Medication List  As of 04/08/2012  7:48 AM           aspirin EC 81 MG tablet   Take 81 mg by mouth daily.      diltiazem 180 MG 24 hr capsule    Commonly known as: TIAZAC   Take 1 capsule (180 mg total) by mouth one time daily.      dofetilide 500 MCG capsule   Commonly known as: TIKOSYN   Take 1 capsule (500 mcg total) by mouth 2 (two) times daily.      lisinopril 20 MG tablet   Commonly known as: PRINIVIL,ZESTRIL   Take 1 tablet (20 mg total) by mouth daily.      simvastatin 40 MG tablet   Commonly known as: ZOCOR   Take 40 mg by mouth at bedtime.      warfarin 10 MG tablet   Commonly known as: COUMADIN   Take 10-15 mg by mouth daily. Taking 10 mg M_W_F and 15 mg T_TH_SAT_SUN          Protonix 40mg  1 tablet daily for 6 weeks.   Disposition:  Discharge Orders    Future Appointments: Provider: Department: Dept Phone: Center:   04/11/2012 2:30 PM [facility] [phone] None   07/07/2012 11:00 AM Hillis Range, MD [facility] [phone] [facility]     Future Orders Please Complete By Expires   Diet - low sodium heart healthy      Increase activity slowly      Discharge instructions      Comments:   Keep procedure site clean & dry. If you notice increased pain, swelling, bleeding or pus, call/return!  You may shower, but no soaking baths/hot tubs/pools for 1 week. No driving for 3 days. No lifting over 5 lbs for 1 week.     Follow-up Information    Follow up with [facility] on 04/11/2012. (At 2:30 PM for Coumadin follow-up (INR check))       Follow up with Hillis Range, MD on 07/07/2012. (At 11:00 AM)    Contact information:   [address], [address] [place] [address] [phone]          Duration of Discharge Encounter: Greater than 30 minutes including physician time.  Signed, Gypsy Balsam, RN, BSN 04/08/2012, 8:29 AM   I have seen, examined the patient, and reviewed the above assessment and plan.  Changes to above are made where necessary.    Co Sign: Hillis Range, MD 04/08/2012 11:37 AM

## 2012-04-15 ENCOUNTER — Ambulatory Visit (INDEPENDENT_AMBULATORY_CARE_PROVIDER_SITE_OTHER): Payer: Self-pay | Admitting: Pharmacist

## 2012-04-15 DIAGNOSIS — I4891 Unspecified atrial fibrillation: Secondary | ICD-10-CM

## 2012-04-15 DIAGNOSIS — Z954 Presence of other heart-valve replacement: Secondary | ICD-10-CM

## 2012-04-15 DIAGNOSIS — Z952 Presence of prosthetic heart valve: Secondary | ICD-10-CM

## 2012-04-15 DIAGNOSIS — Z7901 Long term (current) use of anticoagulants: Secondary | ICD-10-CM

## 2012-04-15 LAB — POCT INR: INR: 5.4

## 2012-04-18 ENCOUNTER — Telehealth: Payer: Self-pay | Admitting: Internal Medicine

## 2012-04-18 NOTE — Telephone Encounter (Signed)
Pt had ablation, about a week ago, having some a-fib, pls advise [phone]

## 2012-04-18 NOTE — Telephone Encounter (Signed)
Spoke with pt. Pt had at fib ablation  04/07/12.  Pt states he had at fib for about 3 hours yesterday. Last Wed and  Saturday had at fib for about 2 hours. Pt is at fib now for about the last 2 hours. Pt feels his heart rate has been less than 130 and has not had any other symptoms. Pt increased diltiazem to 180mg  twice a day last Wed. BP is 143/87 now.

## 2012-04-18 NOTE — Telephone Encounter (Signed)
Reviewed with Dr Johney Frame. No new recommendations at the present time. Pt is aware.

## 2012-04-21 ENCOUNTER — Other Ambulatory Visit: Payer: Self-pay | Admitting: Pharmacist

## 2012-04-21 DIAGNOSIS — I4891 Unspecified atrial fibrillation: Secondary | ICD-10-CM

## 2012-04-22 ENCOUNTER — Ambulatory Visit (INDEPENDENT_AMBULATORY_CARE_PROVIDER_SITE_OTHER): Payer: Self-pay | Admitting: *Deleted

## 2012-04-22 DIAGNOSIS — Z954 Presence of other heart-valve replacement: Secondary | ICD-10-CM

## 2012-04-22 DIAGNOSIS — Z952 Presence of prosthetic heart valve: Secondary | ICD-10-CM

## 2012-04-22 DIAGNOSIS — Z7901 Long term (current) use of anticoagulants: Secondary | ICD-10-CM

## 2012-04-22 DIAGNOSIS — I4891 Unspecified atrial fibrillation: Secondary | ICD-10-CM

## 2012-04-22 LAB — POCT INR: INR: 4.4

## 2012-05-06 ENCOUNTER — Ambulatory Visit (INDEPENDENT_AMBULATORY_CARE_PROVIDER_SITE_OTHER): Payer: Self-pay | Admitting: *Deleted

## 2012-05-06 DIAGNOSIS — Z7901 Long term (current) use of anticoagulants: Secondary | ICD-10-CM

## 2012-05-06 DIAGNOSIS — I4891 Unspecified atrial fibrillation: Secondary | ICD-10-CM

## 2012-05-06 DIAGNOSIS — Z952 Presence of prosthetic heart valve: Secondary | ICD-10-CM

## 2012-05-06 DIAGNOSIS — Z954 Presence of other heart-valve replacement: Secondary | ICD-10-CM

## 2012-05-06 LAB — POCT INR: INR: 6.1

## 2012-05-13 ENCOUNTER — Other Ambulatory Visit: Payer: Self-pay

## 2012-05-13 ENCOUNTER — Ambulatory Visit (INDEPENDENT_AMBULATORY_CARE_PROVIDER_SITE_OTHER): Payer: Self-pay

## 2012-05-13 DIAGNOSIS — I4891 Unspecified atrial fibrillation: Secondary | ICD-10-CM

## 2012-05-13 DIAGNOSIS — Z7901 Long term (current) use of anticoagulants: Secondary | ICD-10-CM

## 2012-05-13 DIAGNOSIS — Z954 Presence of other heart-valve replacement: Secondary | ICD-10-CM

## 2012-05-13 DIAGNOSIS — Z952 Presence of prosthetic heart valve: Secondary | ICD-10-CM

## 2012-05-13 LAB — POCT INR: INR: 4.5

## 2012-05-13 MED ORDER — WARFARIN SODIUM 10 MG PO TABS: ORAL_TABLET | ORAL | Status: DC

## 2012-05-14 ENCOUNTER — Emergency Department (HOSPITAL_COMMUNITY)
Admission: EM | Admit: 2012-05-14 | Discharge: 2012-05-15 | Disposition: A | Discharge status: Home / Self Care | Payer: Self-pay | Attending: Emergency Medicine | Admitting: Emergency Medicine

## 2012-05-14 ENCOUNTER — Encounter (HOSPITAL_COMMUNITY): Payer: Self-pay | Admitting: *Deleted

## 2012-05-14 ENCOUNTER — Emergency Department (HOSPITAL_COMMUNITY): Payer: Self-pay

## 2012-05-14 DIAGNOSIS — G4733 Obstructive sleep apnea (adult) (pediatric): Secondary | ICD-10-CM | POA: Insufficient documentation

## 2012-05-14 DIAGNOSIS — R5383 Other fatigue: Secondary | ICD-10-CM

## 2012-05-14 DIAGNOSIS — Z7901 Long term (current) use of anticoagulants: Secondary | ICD-10-CM | POA: Insufficient documentation

## 2012-05-14 DIAGNOSIS — Z79899 Other long term (current) drug therapy: Secondary | ICD-10-CM | POA: Insufficient documentation

## 2012-05-14 DIAGNOSIS — I4891 Unspecified atrial fibrillation: Secondary | ICD-10-CM | POA: Insufficient documentation

## 2012-05-14 DIAGNOSIS — R002 Palpitations: Secondary | ICD-10-CM | POA: Insufficient documentation

## 2012-05-14 DIAGNOSIS — R5381 Other malaise: Secondary | ICD-10-CM | POA: Insufficient documentation

## 2012-05-14 DIAGNOSIS — I10 Essential (primary) hypertension: Secondary | ICD-10-CM | POA: Insufficient documentation

## 2012-05-14 DIAGNOSIS — I2510 Atherosclerotic heart disease of native coronary artery without angina pectoris: Secondary | ICD-10-CM | POA: Insufficient documentation

## 2012-05-14 HISTORY — DX: Atherosclerotic heart disease of native coronary artery without angina pectoris: I25.10

## 2012-05-14 LAB — CBC WITH DIFFERENTIAL/PLATELET
Basophils Absolute: 0 10*3/uL (ref 0.0–0.1)
Basophils Relative: 1 % (ref 0–1)
Eosinophils Absolute: 0.1 10*3/uL (ref 0.0–0.7)
Eosinophils Relative: 2 % (ref 0–5)
HCT: 41.6 % (ref 39.0–52.0)
Hemoglobin: 14.7 g/dL (ref 13.0–17.0)
Lymphocytes Relative: 35 % (ref 12–46)
Lymphs Abs: 3 10*3/uL (ref 0.7–4.0)
MCH: 30.9 pg (ref 26.0–34.0)
MCHC: 35.3 g/dL (ref 30.0–36.0)
MCV: 87.6 fL (ref 78.0–100.0)
Monocytes Absolute: 0.6 10*3/uL (ref 0.1–1.0)
Monocytes Relative: 7 % (ref 3–12)
Neutro Abs: 5 10*3/uL (ref 1.7–7.7)
Neutrophils Relative %: 57 % (ref 43–77)
Platelets: 184 10*3/uL (ref 150–400)
RBC: 4.75 MIL/uL (ref 4.22–5.81)
RDW: 12.6 % (ref 11.5–15.5)
WBC: 8.7 10*3/uL (ref 4.0–10.5)

## 2012-05-14 LAB — COMPREHENSIVE METABOLIC PANEL
ALT: 29 U/L (ref 0–53)
AST: 31 U/L (ref 0–37)
Albumin: 4.6 g/dL (ref 3.5–5.2)
Alkaline Phosphatase: 68 U/L (ref 39–117)
BUN: 18 mg/dL (ref 6–23)
CO2: 27 mEq/L (ref 19–32)
Calcium: 10.1 mg/dL (ref 8.4–10.5)
Chloride: 102 mEq/L (ref 96–112)
Creatinine, Ser: 1.2 mg/dL (ref 0.50–1.35)
GFR calc Af Amer: 81 mL/min — ABNORMAL LOW (ref >90)
GFR calc non Af Amer: 70 mL/min — ABNORMAL LOW (ref >90)
Glucose, Bld: 136 mg/dL — ABNORMAL HIGH (ref 70–99)
Potassium: 4.2 mEq/L (ref 3.5–5.1)
Sodium: 139 mEq/L (ref 135–145)
Total Bilirubin: 0.8 mg/dL (ref 0.3–1.2)
Total Protein: 8.2 g/dL (ref 6.0–8.3)

## 2012-05-14 LAB — PROTIME-INR
INR: 2.71 — ABNORMAL HIGH (ref 0.00–1.49)
Prothrombin Time: 29.2 seconds — ABNORMAL HIGH (ref 11.6–15.2)

## 2012-05-14 LAB — TROPONIN I: Troponin I: <0.3 ng/mL (ref <0.30)
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
## 2012-05-14 NOTE — ED Notes (Signed)
The pt has had sob all day and he feel weak and very tired and he feels like his heart is beating out of rhy

## 2012-05-14 NOTE — ED Notes (Signed)
Patient presents stating for the last couple of days he has been having discomfort in his chest.  States it feels like his heart is beating irregularly at times.  Had ablation 5 weeks ago, aortic valve replacement years ago.  Has been seeing Dr. Joselyn Glassman as his cardiac physician.

## 2012-05-14 NOTE — ED Notes (Signed)
Family waiting in room while the patient was taken to xray

## 2012-05-15 LAB — TSH: TSH: 1.8 u[IU]/mL (ref 0.350–4.500)

## 2012-05-20 ENCOUNTER — Telehealth (HOSPITAL_COMMUNITY): Payer: Self-pay | Admitting: *Deleted

## 2012-05-20 NOTE — Telephone Encounter (Signed)
Called [employer] at [phone] and ordered pt's Tikosyn, pt ID [id number], order # [id number]

## 2012-05-27 ENCOUNTER — Ambulatory Visit (INDEPENDENT_AMBULATORY_CARE_PROVIDER_SITE_OTHER): Payer: Self-pay | Admitting: Family Medicine

## 2012-05-27 ENCOUNTER — Ambulatory Visit (INDEPENDENT_AMBULATORY_CARE_PROVIDER_SITE_OTHER): Payer: Self-pay | Admitting: *Deleted

## 2012-05-27 ENCOUNTER — Encounter: Payer: Self-pay | Admitting: Internal Medicine

## 2012-05-27 ENCOUNTER — Encounter: Payer: Self-pay | Admitting: Family Medicine

## 2012-05-27 ENCOUNTER — Ambulatory Visit (INDEPENDENT_AMBULATORY_CARE_PROVIDER_SITE_OTHER): Payer: Self-pay | Admitting: Internal Medicine

## 2012-05-27 ENCOUNTER — Ambulatory Visit (HOSPITAL_BASED_OUTPATIENT_CLINIC_OR_DEPARTMENT_OTHER)
Admission: RE | Admit: 2012-05-27 | Discharge: 2012-05-27 | Disposition: A | Discharge status: Home / Self Care | Payer: Self-pay | Source: Ambulatory Visit | Attending: Family Medicine | Admitting: Family Medicine

## 2012-05-27 ENCOUNTER — Telehealth: Payer: Self-pay | Admitting: *Deleted

## 2012-05-27 VITALS — BP 140/94 | HR 74 | Ht 71.0 in | Wt 234.0 lb

## 2012-05-27 VITALS — BP 130/90 | HR 83 | Temp 98.5°F | Ht 70.0 in | Wt 237.4 lb

## 2012-05-27 DIAGNOSIS — K2289 Other specified disease of esophagus: Secondary | ICD-10-CM | POA: Insufficient documentation

## 2012-05-27 DIAGNOSIS — I712 Thoracic aortic aneurysm, without rupture, unspecified: Secondary | ICD-10-CM | POA: Insufficient documentation

## 2012-05-27 DIAGNOSIS — R079 Chest pain, unspecified: Secondary | ICD-10-CM

## 2012-05-27 DIAGNOSIS — K449 Diaphragmatic hernia without obstruction or gangrene: Secondary | ICD-10-CM | POA: Insufficient documentation

## 2012-05-27 DIAGNOSIS — Z952 Presence of prosthetic heart valve: Secondary | ICD-10-CM

## 2012-05-27 DIAGNOSIS — I4891 Unspecified atrial fibrillation: Secondary | ICD-10-CM

## 2012-05-27 DIAGNOSIS — K228 Other specified diseases of esophagus: Secondary | ICD-10-CM | POA: Insufficient documentation

## 2012-05-27 DIAGNOSIS — Z7901 Long term (current) use of anticoagulants: Secondary | ICD-10-CM

## 2012-05-27 DIAGNOSIS — I719 Aortic aneurysm of unspecified site, without rupture: Secondary | ICD-10-CM

## 2012-05-27 DIAGNOSIS — Z954 Presence of other heart-valve replacement: Secondary | ICD-10-CM

## 2012-05-27 DIAGNOSIS — R188 Other ascites: Secondary | ICD-10-CM | POA: Insufficient documentation

## 2012-05-27 LAB — POCT INR: INR: 5.7

## 2012-05-27 MED ORDER — HYDROCODONE-ACETAMINOPHEN 5-500 MG PO TABS: ORAL_TABLET | ORAL | Status: DC

## 2012-05-27 MED ORDER — IOHEXOL 350 MG/ML SOLN
100.0000 mL | Freq: Once | INTRAVENOUS | Status: AC | PRN
  Administered 2012-05-27: 100 mL via INTRAVENOUS

## 2012-05-27 MED ORDER — COLCHICINE 0.6 MG PO TABS: 0.6000 mg | ORAL_TABLET | Freq: Two times a day (BID) | ORAL | Status: DC

## 2012-05-27 MED ORDER — PREDNISONE 20 MG PO TABS: ORAL_TABLET | ORAL | Status: DC

## 2012-05-27 NOTE — Progress Notes (Signed)
  Subjective:    Patient ID: Mark [name], male    DOB: December 25, 1960, 51 y.o.   MRN: [id number]  HPI Chest discomfort- went to ER on 9/14 w/ SOB but had normal w/u.  'i feel like it's a pulled muscle in my chest'.  Uncomfortable to lie down, rest, has back pain 'right between the shoulder blades'.  Pain x48 hrs.  Some SOB.  Wednesday AM lifted AC unit.  Denies change in regular activity.  Movement doesn't change pain level but it will worsen w/ rising from seated position.  Worse w/ cough.  Unable to lie down due to severe pain.   Review of Systems For ROS see HPI     Objective:   Physical Exam  Vitals reviewed. Constitutional: He is oriented to person, place, and time. He appears well-developed and well-nourished.       Obviously uncomfortable  HENT:  Head: Normocephalic and atraumatic.  Neck: Normal range of motion. Neck supple.  Cardiovascular: Normal rate and regular rhythm.   Murmur (mechanical click audible w/out stethoscope, III/VI whooshing SEM heard best over USB) heard. Pulmonary/Chest: Effort normal and breath sounds normal. No respiratory distress. He has no wheezes. He has no rales. He exhibits no tenderness.  Musculoskeletal: He exhibits no edema.  Lymphadenopathy:    He has no cervical adenopathy.  Neurological: He is alert and oriented to person, place, and time.  Skin: Skin is warm and dry.  Psychiatric: He has a normal mood and affect. His behavior is normal. Thought content normal.          Assessment & Plan:

## 2012-05-27 NOTE — Patient Instructions (Signed)
Your physician recommends that you schedule a follow-up appointment on 06/01/12 at 3:45

## 2012-05-27 NOTE — Telephone Encounter (Signed)
Called pt to advise the following notations made by MD [name] in result notations  Discussed w/ cards, cannot r/o possibility of microperforation of ascending aortic aneurysm but cards indicated this will usually spontaneously heal. Will tx pain and f/u w/ cards. Cards to call pt and set up f/u. Start Colchicine 0.6 BID x7 days (#14), Prednisone 20 mg 2 tabs daily x7 days (#14), and Vicodin 5/500mg  1 tab po q4-6 prn pain #30  Pt advised he was actually in the Cards office now per they called him in, MD [name] advised she will send the RX anyway and if the Cards MD wants to change it he will not have to pick them up, however they will be at his pharmacy, pt understood, Sent all medications via escribe/fax

## 2012-05-28 NOTE — Assessment & Plan Note (Signed)
Given hx of aneurysm, have concern for current dissection or rupture w/ loud murmur, pain across chest and between shoulder blades that is obviously not musculoskeletal.  Spoke w/ cards and will proceed w/ stat CT angiogram.  Pt expressed understanding and is in agreement w/ plan.

## 2012-05-28 NOTE — Assessment & Plan Note (Signed)
New.  See above note for aortic aneurysm.  Results of CT angio are consistent w/ possible microperforation.  Will tx pain/pericarditis and pt will have close f/u w/ cards.  Pt expressed understanding and is in agreement w/ plan.
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
## 2012-06-01 ENCOUNTER — Ambulatory Visit (INDEPENDENT_AMBULATORY_CARE_PROVIDER_SITE_OTHER): Payer: Self-pay | Admitting: Internal Medicine

## 2012-06-01 ENCOUNTER — Encounter: Payer: Self-pay | Admitting: Internal Medicine

## 2012-06-01 VITALS — BP 128/78 | HR 64 | Ht 71.0 in | Wt 239.4 lb

## 2012-06-01 DIAGNOSIS — I4891 Unspecified atrial fibrillation: Secondary | ICD-10-CM

## 2012-06-01 MED ORDER — DILTIAZEM HCL ER BEADS 180 MG PO CP24: 180.0000 mg | ORAL_CAPSULE | Freq: Every day | ORAL | Status: DC

## 2012-06-01 NOTE — Patient Instructions (Signed)
Your physician wants you to follow-up in: 6 weeks with Dr Johney Frame Bonita Quin will receive a reminder letter in the mail two months in advance. If you don't receive a letter, please call our office to schedule the follow-up appointment.  Your physician has recommended you make the following change in your medication:  1) Decrease Diltiazem to 180mg  once daily
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
## 2012-06-10 ENCOUNTER — Ambulatory Visit (INDEPENDENT_AMBULATORY_CARE_PROVIDER_SITE_OTHER): Payer: Self-pay

## 2012-06-10 DIAGNOSIS — Z952 Presence of prosthetic heart valve: Secondary | ICD-10-CM

## 2012-06-10 DIAGNOSIS — Z7901 Long term (current) use of anticoagulants: Secondary | ICD-10-CM

## 2012-06-10 DIAGNOSIS — I4891 Unspecified atrial fibrillation: Secondary | ICD-10-CM

## 2012-06-10 DIAGNOSIS — Z954 Presence of other heart-valve replacement: Secondary | ICD-10-CM

## 2012-06-10 LAB — POCT INR: INR: 7.2

## 2012-06-10 LAB — PROTIME-INR
INR: 6.7 ratio (ref 0.8–1.0)
Prothrombin Time: 67.8 s (ref 10.2–12.4)

## 2012-06-15 ENCOUNTER — Telehealth: Payer: Self-pay | Admitting: Internal Medicine

## 2012-06-15 NOTE — Telephone Encounter (Signed)
New Problem:    Patient called returning your call.  Please call back. 

## 2012-06-15 NOTE — Telephone Encounter (Signed)
Telephoned pt he was going to change appt, but states he will keep it on Friday as scheduled.

## 2012-06-17 ENCOUNTER — Telehealth: Payer: Self-pay | Admitting: *Deleted

## 2012-06-17 ENCOUNTER — Ambulatory Visit (INDEPENDENT_AMBULATORY_CARE_PROVIDER_SITE_OTHER): Payer: Self-pay | Admitting: *Deleted

## 2012-06-17 DIAGNOSIS — Z952 Presence of prosthetic heart valve: Secondary | ICD-10-CM

## 2012-06-17 DIAGNOSIS — I4891 Unspecified atrial fibrillation: Secondary | ICD-10-CM

## 2012-06-17 DIAGNOSIS — Z7901 Long term (current) use of anticoagulants: Secondary | ICD-10-CM

## 2012-06-17 DIAGNOSIS — Z954 Presence of other heart-valve replacement: Secondary | ICD-10-CM

## 2012-06-17 LAB — POCT INR: INR: 3.1

## 2012-06-17 NOTE — Telephone Encounter (Signed)
Started feeling bad and so he took his BP 154/92 HR 80 and felt a squeezing sensation in chest. Lasted only seconds. Discussed with Dr Johney Frame and he suggested patient keeping a log and calling us back middle of next week with reading  Pt aware and will monitor pressures for now

## 2012-06-21 NOTE — Addendum Note (Signed)
Addended by: Marrion Coy L on: 06/21/2012 03:17 PM   Modules accepted: Orders

## 2012-06-22 ENCOUNTER — Other Ambulatory Visit: Payer: Self-pay

## 2012-06-22 MED ORDER — SIMVASTATIN 40 MG PO TABS: 40.0000 mg | ORAL_TABLET | Freq: Every day | ORAL | Status: DC

## 2012-06-24 ENCOUNTER — Ambulatory Visit (INDEPENDENT_AMBULATORY_CARE_PROVIDER_SITE_OTHER): Payer: Self-pay

## 2012-06-24 DIAGNOSIS — I4891 Unspecified atrial fibrillation: Secondary | ICD-10-CM

## 2012-06-24 DIAGNOSIS — Z954 Presence of other heart-valve replacement: Secondary | ICD-10-CM

## 2012-06-24 DIAGNOSIS — Z952 Presence of prosthetic heart valve: Secondary | ICD-10-CM

## 2012-06-24 DIAGNOSIS — Z7901 Long term (current) use of anticoagulants: Secondary | ICD-10-CM

## 2012-06-24 LAB — POCT INR: INR: 5.5

## 2012-07-01 ENCOUNTER — Ambulatory Visit (INDEPENDENT_AMBULATORY_CARE_PROVIDER_SITE_OTHER): Payer: Self-pay | Admitting: *Deleted

## 2012-07-01 DIAGNOSIS — Z952 Presence of prosthetic heart valve: Secondary | ICD-10-CM

## 2012-07-01 DIAGNOSIS — I4891 Unspecified atrial fibrillation: Secondary | ICD-10-CM

## 2012-07-01 DIAGNOSIS — Z954 Presence of other heart-valve replacement: Secondary | ICD-10-CM

## 2012-07-01 DIAGNOSIS — Z7901 Long term (current) use of anticoagulants: Secondary | ICD-10-CM

## 2012-07-01 LAB — POCT INR: INR: 5.5

## 2012-07-07 ENCOUNTER — Encounter: Payer: Self-pay | Admitting: Internal Medicine

## 2012-07-11 ENCOUNTER — Ambulatory Visit (INDEPENDENT_AMBULATORY_CARE_PROVIDER_SITE_OTHER): Payer: Self-pay | Admitting: Pharmacist

## 2012-07-11 ENCOUNTER — Encounter: Payer: Self-pay | Admitting: Internal Medicine

## 2012-07-11 ENCOUNTER — Ambulatory Visit (INDEPENDENT_AMBULATORY_CARE_PROVIDER_SITE_OTHER): Payer: Self-pay | Admitting: Internal Medicine

## 2012-07-11 VITALS — BP 127/94 | HR 63 | Ht 71.0 in | Wt 236.0 lb

## 2012-07-11 DIAGNOSIS — Z7901 Long term (current) use of anticoagulants: Secondary | ICD-10-CM

## 2012-07-11 DIAGNOSIS — I4891 Unspecified atrial fibrillation: Secondary | ICD-10-CM

## 2012-07-11 DIAGNOSIS — Z954 Presence of other heart-valve replacement: Secondary | ICD-10-CM

## 2012-07-11 DIAGNOSIS — Z952 Presence of prosthetic heart valve: Secondary | ICD-10-CM

## 2012-07-11 DIAGNOSIS — R079 Chest pain, unspecified: Secondary | ICD-10-CM

## 2012-07-11 LAB — POCT INR: INR: 4.7

## 2012-07-11 MED ORDER — PANTOPRAZOLE SODIUM 40 MG PO TBEC: 40.0000 mg | DELAYED_RELEASE_TABLET | Freq: Every day | ORAL | Status: DC

## 2012-07-11 NOTE — Patient Instructions (Addendum)
STOP Tikosyn.  Your physician recommends that you schedule a follow-up appointment in: 3 months with Dr. Johney Frame.

## 2012-07-20 ENCOUNTER — Emergency Department (HOSPITAL_COMMUNITY): Payer: Self-pay

## 2012-07-20 ENCOUNTER — Emergency Department (HOSPITAL_COMMUNITY)
Admission: EM | Admit: 2012-07-20 | Discharge: 2012-07-20 | Disposition: A | Discharge status: Home / Self Care | Payer: Self-pay | Attending: Emergency Medicine | Admitting: Emergency Medicine

## 2012-07-20 ENCOUNTER — Encounter (HOSPITAL_COMMUNITY): Payer: Self-pay | Admitting: Emergency Medicine

## 2012-07-20 DIAGNOSIS — I2510 Atherosclerotic heart disease of native coronary artery without angina pectoris: Secondary | ICD-10-CM | POA: Insufficient documentation

## 2012-07-20 DIAGNOSIS — I359 Nonrheumatic aortic valve disorder, unspecified: Secondary | ICD-10-CM | POA: Insufficient documentation

## 2012-07-20 DIAGNOSIS — Z8719 Personal history of other diseases of the digestive system: Secondary | ICD-10-CM | POA: Insufficient documentation

## 2012-07-20 DIAGNOSIS — R51 Headache: Secondary | ICD-10-CM | POA: Insufficient documentation

## 2012-07-20 DIAGNOSIS — Z7901 Long term (current) use of anticoagulants: Secondary | ICD-10-CM | POA: Insufficient documentation

## 2012-07-20 DIAGNOSIS — I712 Thoracic aortic aneurysm, without rupture, unspecified: Secondary | ICD-10-CM | POA: Insufficient documentation

## 2012-07-20 DIAGNOSIS — Z79899 Other long term (current) drug therapy: Secondary | ICD-10-CM | POA: Insufficient documentation

## 2012-07-20 DIAGNOSIS — I10 Essential (primary) hypertension: Secondary | ICD-10-CM | POA: Insufficient documentation

## 2012-07-20 DIAGNOSIS — R000 Tachycardia, unspecified: Secondary | ICD-10-CM | POA: Insufficient documentation

## 2012-07-20 DIAGNOSIS — Z7982 Long term (current) use of aspirin: Secondary | ICD-10-CM | POA: Insufficient documentation

## 2012-07-20 LAB — CBC WITH DIFFERENTIAL/PLATELET
Basophils Absolute: 0 10*3/uL (ref 0.0–0.1)
Basophils Relative: 0 % (ref 0–1)
Eosinophils Absolute: 0.1 10*3/uL (ref 0.0–0.7)
Eosinophils Relative: 1 % (ref 0–5)
HCT: 39.5 % (ref 39.0–52.0)
Hemoglobin: 13.5 g/dL (ref 13.0–17.0)
Lymphocytes Relative: 18 % (ref 12–46)
Lymphs Abs: 1.3 10*3/uL (ref 0.7–4.0)
MCH: 29.9 pg (ref 26.0–34.0)
MCHC: 34.2 g/dL (ref 30.0–36.0)
MCV: 87.4 fL (ref 78.0–100.0)
Monocytes Absolute: 0.6 10*3/uL (ref 0.1–1.0)
Monocytes Relative: 7 % (ref 3–12)
Neutro Abs: 5.5 10*3/uL (ref 1.7–7.7)
Neutrophils Relative %: 74 % (ref 43–77)
Platelets: 153 10*3/uL (ref 150–400)
RBC: 4.52 MIL/uL (ref 4.22–5.81)
RDW: 12.5 % (ref 11.5–15.5)
WBC: 7.4 10*3/uL (ref 4.0–10.5)

## 2012-07-20 LAB — BASIC METABOLIC PANEL
BUN: 18 mg/dL (ref 6–23)
CO2: 24 mEq/L (ref 19–32)
Calcium: 9.4 mg/dL (ref 8.4–10.5)
Chloride: 104 mEq/L (ref 96–112)
Creatinine, Ser: 1.2 mg/dL (ref 0.50–1.35)
GFR calc Af Amer: 80 mL/min — ABNORMAL LOW (ref >90)
GFR calc non Af Amer: 69 mL/min — ABNORMAL LOW (ref >90)
Glucose, Bld: 106 mg/dL — ABNORMAL HIGH (ref 70–99)
Potassium: 3.8 mEq/L (ref 3.5–5.1)
Sodium: 139 mEq/L (ref 135–145)

## 2012-07-20 LAB — POCT I-STAT TROPONIN I: Troponin i, poc: 0 ng/mL (ref 0.00–0.08)

## 2012-07-20 LAB — PROTIME-INR
INR: 4.14 — ABNORMAL HIGH (ref 0.00–1.49)
Prothrombin Time: 37.5 seconds — ABNORMAL HIGH (ref 11.6–15.2)

## 2012-07-20 NOTE — ED Notes (Signed)
Pt back to radiology.
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
## 2012-07-20 NOTE — ED Notes (Signed)
Pt sts afib starting at 1400 with SOB; pt sts hx of same in past

## 2012-07-21 ENCOUNTER — Telehealth: Payer: Self-pay | Admitting: Internal Medicine

## 2012-07-21 ENCOUNTER — Ambulatory Visit: Payer: Self-pay | Admitting: Cardiology

## 2012-07-21 DIAGNOSIS — Z7901 Long term (current) use of anticoagulants: Secondary | ICD-10-CM

## 2012-07-21 DIAGNOSIS — Z952 Presence of prosthetic heart valve: Secondary | ICD-10-CM

## 2012-07-21 DIAGNOSIS — I4891 Unspecified atrial fibrillation: Secondary | ICD-10-CM

## 2012-07-21 NOTE — Telephone Encounter (Signed)
Started about 2:00 lasted until he got to the ER.  The EKG showed sinus tach at 116.  He says today he just feels tired.  I discussed with Dr Johney Frame and he says to continue with all medications as directed.  No change for

## 2012-07-21 NOTE — Telephone Encounter (Signed)
New problem:   C/O went back into Afib on yesterday. Went to er @ [place]. Please advise on medication adjustment.

## 2012-07-22 NOTE — Assessment & Plan Note (Signed)
Maintaining sinus rhythm s/p ablation Stop tikosyn today Continue coumadin indefinitely  Return in 3 months
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
## 2012-08-08 ENCOUNTER — Telehealth: Payer: Self-pay | Admitting: Internal Medicine

## 2012-08-08 DIAGNOSIS — I4891 Unspecified atrial fibrillation: Secondary | ICD-10-CM

## 2012-08-08 NOTE — Telephone Encounter (Signed)
Going to have him wear a monitor to see exactly what his heart is doing  Patient is agreeable

## 2012-08-08 NOTE — Telephone Encounter (Signed)
Pt has questions re a-fib pls call

## 2012-08-08 NOTE — Addendum Note (Signed)
Addended by: Dennis Bast F on: 08/08/2012 04:51 PM   Modules accepted: Orders

## 2012-08-08 NOTE — Telephone Encounter (Signed)
Had an episode yesterday that lasted almost 20 min and took his breath.  He calls them the "minis"  Tomorrow he needs to go to [place] for a service call and is scared to go..  Happens 4-6 times daily.

## 2012-08-10 ENCOUNTER — Encounter: Payer: Self-pay | Admitting: *Deleted

## 2012-08-12 ENCOUNTER — Ambulatory Visit (INDEPENDENT_AMBULATORY_CARE_PROVIDER_SITE_OTHER): Payer: Self-pay

## 2012-08-12 DIAGNOSIS — I4891 Unspecified atrial fibrillation: Secondary | ICD-10-CM

## 2012-08-12 DIAGNOSIS — Z7901 Long term (current) use of anticoagulants: Secondary | ICD-10-CM

## 2012-08-12 DIAGNOSIS — Z954 Presence of other heart-valve replacement: Secondary | ICD-10-CM

## 2012-08-12 DIAGNOSIS — Z952 Presence of prosthetic heart valve: Secondary | ICD-10-CM

## 2012-08-12 LAB — POCT INR: INR: 5.4

## 2012-08-15 ENCOUNTER — Telehealth: Payer: Self-pay | Admitting: *Deleted

## 2012-08-15 NOTE — Telephone Encounter (Signed)
Pt was enrolled for event monitor to be mailed 08/15/12. TK

## 2012-08-17 ENCOUNTER — Observation Stay (HOSPITAL_COMMUNITY)
Admission: EM | Admit: 2012-08-17 | Discharge: 2012-08-18 | Disposition: A | Discharge status: Home / Self Care | Payer: Self-pay | Attending: Internal Medicine | Admitting: Internal Medicine

## 2012-08-17 ENCOUNTER — Telehealth: Payer: Self-pay | Admitting: Physician Assistant

## 2012-08-17 ENCOUNTER — Encounter (HOSPITAL_COMMUNITY): Payer: Self-pay | Admitting: Physical Medicine and Rehabilitation

## 2012-08-17 ENCOUNTER — Emergency Department (HOSPITAL_COMMUNITY): Payer: Self-pay

## 2012-08-17 DIAGNOSIS — G4733 Obstructive sleep apnea (adult) (pediatric): Secondary | ICD-10-CM

## 2012-08-17 DIAGNOSIS — I350 Nonrheumatic aortic (valve) stenosis: Secondary | ICD-10-CM

## 2012-08-17 DIAGNOSIS — Z7901 Long term (current) use of anticoagulants: Secondary | ICD-10-CM

## 2012-08-17 DIAGNOSIS — R55 Syncope and collapse: Secondary | ICD-10-CM

## 2012-08-17 DIAGNOSIS — N529 Male erectile dysfunction, unspecified: Secondary | ICD-10-CM

## 2012-08-17 DIAGNOSIS — S93409A Sprain of unspecified ligament of unspecified ankle, initial encounter: Secondary | ICD-10-CM

## 2012-08-17 DIAGNOSIS — I7121 Aneurysm of the ascending aorta, without rupture: Secondary | ICD-10-CM

## 2012-08-17 DIAGNOSIS — R002 Palpitations: Secondary | ICD-10-CM

## 2012-08-17 DIAGNOSIS — I330 Acute and subacute infective endocarditis: Secondary | ICD-10-CM

## 2012-08-17 DIAGNOSIS — R0602 Shortness of breath: Secondary | ICD-10-CM | POA: Insufficient documentation

## 2012-08-17 DIAGNOSIS — M79609 Pain in unspecified limb: Secondary | ICD-10-CM

## 2012-08-17 DIAGNOSIS — Z954 Presence of other heart-valve replacement: Secondary | ICD-10-CM

## 2012-08-17 DIAGNOSIS — I714 Abdominal aortic aneurysm, without rupture, unspecified: Secondary | ICD-10-CM | POA: Insufficient documentation

## 2012-08-17 DIAGNOSIS — L089 Local infection of the skin and subcutaneous tissue, unspecified: Secondary | ICD-10-CM

## 2012-08-17 DIAGNOSIS — N10 Acute tubulo-interstitial nephritis: Secondary | ICD-10-CM

## 2012-08-17 DIAGNOSIS — J302 Other seasonal allergic rhinitis: Secondary | ICD-10-CM

## 2012-08-17 DIAGNOSIS — Z952 Presence of prosthetic heart valve: Secondary | ICD-10-CM

## 2012-08-17 DIAGNOSIS — R51 Headache: Secondary | ICD-10-CM

## 2012-08-17 DIAGNOSIS — I719 Aortic aneurysm of unspecified site, without rupture: Secondary | ICD-10-CM

## 2012-08-17 DIAGNOSIS — Q231 Congenital insufficiency of aortic valve: Secondary | ICD-10-CM | POA: Insufficient documentation

## 2012-08-17 DIAGNOSIS — E785 Hyperlipidemia, unspecified: Secondary | ICD-10-CM

## 2012-08-17 DIAGNOSIS — Z23 Encounter for immunization: Secondary | ICD-10-CM | POA: Insufficient documentation

## 2012-08-17 DIAGNOSIS — R5383 Other fatigue: Secondary | ICD-10-CM

## 2012-08-17 DIAGNOSIS — R079 Chest pain, unspecified: Secondary | ICD-10-CM

## 2012-08-17 DIAGNOSIS — F411 Generalized anxiety disorder: Secondary | ICD-10-CM | POA: Insufficient documentation

## 2012-08-17 DIAGNOSIS — R509 Fever, unspecified: Secondary | ICD-10-CM

## 2012-08-17 DIAGNOSIS — I712 Thoracic aortic aneurysm, without rupture: Secondary | ICD-10-CM

## 2012-08-17 DIAGNOSIS — I10 Essential (primary) hypertension: Secondary | ICD-10-CM | POA: Insufficient documentation

## 2012-08-17 DIAGNOSIS — I4891 Unspecified atrial fibrillation: Principal | ICD-10-CM

## 2012-08-17 LAB — BASIC METABOLIC PANEL
BUN: 23 mg/dL (ref 6–23)
CO2: 23 mEq/L (ref 19–32)
Calcium: 9.8 mg/dL (ref 8.4–10.5)
Chloride: 105 mEq/L (ref 96–112)
Creatinine, Ser: 1.29 mg/dL (ref 0.50–1.35)
GFR calc Af Amer: 74 mL/min — ABNORMAL LOW (ref >90)
GFR calc non Af Amer: 64 mL/min — ABNORMAL LOW (ref >90)
Glucose, Bld: 95 mg/dL (ref 70–99)
Potassium: 3.9 mEq/L (ref 3.5–5.1)
Sodium: 140 mEq/L (ref 135–145)

## 2012-08-17 LAB — CBC WITH DIFFERENTIAL/PLATELET
Basophils Absolute: 0 10*3/uL (ref 0.0–0.1)
Basophils Relative: 0 % (ref 0–1)
Eosinophils Absolute: 0.1 10*3/uL (ref 0.0–0.7)
Eosinophils Relative: 1 % (ref 0–5)
HCT: 41.5 % (ref 39.0–52.0)
Hemoglobin: 14.3 g/dL (ref 13.0–17.0)
Lymphocytes Relative: 25 % (ref 12–46)
Lymphs Abs: 1.8 10*3/uL (ref 0.7–4.0)
MCH: 30.4 pg (ref 26.0–34.0)
MCHC: 34.5 g/dL (ref 30.0–36.0)
MCV: 88.1 fL (ref 78.0–100.0)
Monocytes Absolute: 0.4 10*3/uL (ref 0.1–1.0)
Monocytes Relative: 6 % (ref 3–12)
Neutro Abs: 4.7 10*3/uL (ref 1.7–7.7)
Neutrophils Relative %: 68 % (ref 43–77)
Platelets: 161 10*3/uL (ref 150–400)
RBC: 4.71 MIL/uL (ref 4.22–5.81)
RDW: 12.5 % (ref 11.5–15.5)
WBC: 6.9 10*3/uL (ref 4.0–10.5)

## 2012-08-17 LAB — POCT I-STAT TROPONIN I: Troponin i, poc: 0 ng/mL (ref 0.00–0.08)

## 2012-08-17 LAB — PROTIME-INR
INR: 2.82 — ABNORMAL HIGH (ref 0.00–1.49)
Prothrombin Time: 28.2 seconds — ABNORMAL HIGH (ref 11.6–15.2)

## 2012-08-17 MED ORDER — DILTIAZEM HCL ER BEADS 180 MG PO CP24: 180.0000 mg | ORAL_CAPSULE | Freq: Every day | ORAL | Status: DC

## 2012-08-17 MED ORDER — WARFARIN - PHYSICIAN DOSING INPATIENT: Freq: Every day | Status: DC

## 2012-08-17 MED ORDER — LISINOPRIL 20 MG PO TABS
20.0000 mg | ORAL_TABLET | Freq: Every day | ORAL | Status: DC
  Administered 2012-08-17: 20 mg via ORAL
  Filled 2012-08-17 (×2): qty 1

## 2012-08-17 MED ORDER — WARFARIN SODIUM 10 MG PO TABS
10.0000 mg | ORAL_TABLET | Freq: Every day | ORAL | Status: DC
  Administered 2012-08-17: 10 mg via ORAL
  Filled 2012-08-17 (×2): qty 1

## 2012-08-17 MED ORDER — DILTIAZEM HCL ER COATED BEADS 180 MG PO CP24
180.0000 mg | ORAL_CAPSULE | Freq: Every day | ORAL | Status: DC
  Administered 2012-08-17: 180 mg via ORAL
  Filled 2012-08-17 (×2): qty 1

## 2012-08-17 MED ORDER — ACETAMINOPHEN 325 MG PO TABS: 650.0000 mg | ORAL_TABLET | ORAL | Status: DC | PRN

## 2012-08-17 MED ORDER — NITROGLYCERIN 0.4 MG SL SUBL: 0.4000 mg | SUBLINGUAL_TABLET | SUBLINGUAL | Status: DC | PRN

## 2012-08-17 MED ORDER — ONDANSETRON HCL 4 MG/2ML IJ SOLN: 4.0000 mg | Freq: Four times a day (QID) | INTRAMUSCULAR | Status: DC | PRN

## 2012-08-17 MED ORDER — SIMVASTATIN 40 MG PO TABS
40.0000 mg | ORAL_TABLET | Freq: Every day | ORAL | Status: DC
  Administered 2012-08-17: 40 mg via ORAL
  Filled 2012-08-17 (×2): qty 1

## 2012-08-17 MED ORDER — ASPIRIN EC 81 MG PO TBEC
81.0000 mg | DELAYED_RELEASE_TABLET | Freq: Every day | ORAL | Status: DC
  Administered 2012-08-18: 81 mg via ORAL
  Filled 2012-08-17: qty 1

## 2012-08-17 MED ORDER — SODIUM CHLORIDE 0.9 % IV SOLN
INTRAVENOUS | Status: AC
  Administered 2012-08-17: 21:00:00 via INTRAVENOUS

## 2012-08-17 MED ORDER — PANTOPRAZOLE SODIUM 40 MG PO TBEC
40.0000 mg | DELAYED_RELEASE_TABLET | Freq: Every day | ORAL | Status: DC
  Administered 2012-08-18: 40 mg via ORAL
  Filled 2012-08-17: qty 1

## 2012-08-17 NOTE — ED Notes (Addendum)
Pt states had episode of palpitations at 21:09 and again at 21:37. Charm Barges, MD aware.

## 2012-08-17 NOTE — ED Notes (Signed)
Pt denies nausea; pt denies numbness and tingling.
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
## 2012-08-17 NOTE — ED Notes (Signed)
Pt presents to department for evaluation of palpitations, midsternal chest pain, SOB and syncope. States he began having palpitations this afternoon @3 :30pm, then states he passed out for several minutes. Denies pain at the time. Respirations unlabored. Skin warm and dry. He is conscious alert and oriented x4.

## 2012-08-17 NOTE — ED Notes (Signed)
Dr Sheldon at bedside  

## 2012-08-17 NOTE — ED Notes (Signed)
Pt denies pain; pt denies palpitation feeling; pt denies nausea and shortness of breath. Pt mentating appropriately.

## 2012-08-17 NOTE — ED Notes (Signed)
Pt states he was going hunting and did not make it far from house (about 100 yards) pt states he thinks he passed out because he woke up against a tree; pt states when he came to his heart was beating fast and could feel palpitations. Pt mentating appropriately. Pt states this comes and goes.

## 2012-08-17 NOTE — ED Notes (Signed)
Pt states that he did not hit head

## 2012-08-17 NOTE — ED Notes (Signed)
[place], MD at bedside.  

## 2012-08-17 NOTE — Telephone Encounter (Signed)
Patient called answering service. Has h/o afib ablation. He felt his heart go out of rhythm this afternoon and thought he was in atrial fib. However, he developed chest pain and believes he also passed out. He came to. His heart went back into rhythm just a few minutes ago. He was wondering what to do next. He has never had CP like that before. Because of CP/syncope, I advised he proceed to ER and not to drive himself. He verbalized understanding and gratitude and will come to ER. Dayna Dunn PA-C

## 2012-08-18 ENCOUNTER — Encounter: Payer: Self-pay | Admitting: Internal Medicine

## 2012-08-18 DIAGNOSIS — I4891 Unspecified atrial fibrillation: Principal | ICD-10-CM

## 2012-08-18 LAB — CBC
HCT: 37.9 % — ABNORMAL LOW (ref 39.0–52.0)
Hemoglobin: 13 g/dL (ref 13.0–17.0)
MCH: 30.2 pg (ref 26.0–34.0)
MCHC: 34.3 g/dL (ref 30.0–36.0)
MCV: 88.1 fL (ref 78.0–100.0)
Platelets: 145 10*3/uL — ABNORMAL LOW (ref 150–400)
RBC: 4.3 MIL/uL (ref 4.22–5.81)
RDW: 12.7 % (ref 11.5–15.5)
WBC: 5.8 10*3/uL (ref 4.0–10.5)

## 2012-08-18 LAB — PROTIME-INR
INR: 2.67 — ABNORMAL HIGH (ref 0.00–1.49)
Prothrombin Time: 27.1 seconds — ABNORMAL HIGH (ref 11.6–15.2)

## 2012-08-18 LAB — BASIC METABOLIC PANEL
BUN: 19 mg/dL (ref 6–23)
CO2: 25 mEq/L (ref 19–32)
Calcium: 8.8 mg/dL (ref 8.4–10.5)
Chloride: 105 mEq/L (ref 96–112)
Creatinine, Ser: 1.13 mg/dL (ref 0.50–1.35)
GFR calc Af Amer: 87 mL/min — ABNORMAL LOW (ref >90)
GFR calc non Af Amer: 75 mL/min — ABNORMAL LOW (ref >90)
Glucose, Bld: 88 mg/dL (ref 70–99)
Potassium: 4 mEq/L (ref 3.5–5.1)
Sodium: 139 mEq/L (ref 135–145)

## 2012-08-18 LAB — MRSA PCR SCREENING: MRSA by PCR: POSITIVE — AB

## 2012-08-18 MED ORDER — PNEUMOCOCCAL VAC POLYVALENT 25 MCG/0.5ML IJ INJ
0.5000 mL | INJECTION | Freq: Once | INTRAMUSCULAR | Status: AC
  Administered 2012-08-18: 0.5 mL via INTRAMUSCULAR
  Filled 2012-08-18 (×3): qty 0.5

## 2012-08-18 MED ORDER — MUPIROCIN 2 % EX OINT
1.0000 "application " | TOPICAL_OINTMENT | Freq: Two times a day (BID) | CUTANEOUS | Status: DC
  Filled 2012-08-18: qty 22

## 2012-08-18 MED ORDER — DILTIAZEM HCL ER BEADS 180 MG PO CP24: 180.0000 mg | ORAL_CAPSULE | Freq: Two times a day (BID) | ORAL | Status: DC

## 2012-08-18 MED ORDER — CHLORHEXIDINE GLUCONATE CLOTH 2 % EX PADS: 6.0000 | MEDICATED_PAD | Freq: Every day | CUTANEOUS | Status: DC

## 2012-08-18 NOTE — Discharge Summary (Signed)
ELECTROPHYSIOLOGY DISCHARGE SUMMARY    Patient ID: Mark [name],  MRN: [id number], DOB/AGE: Dec 15, 1960 51 y.o.  Admit date: 08/17/2012 Discharge date: 08/18/2012  Primary Care Physician: Mark Rhymes, MD Primary Cardiologist: Mark Frame, MD  Primary Discharge Diagnosis:  1. Probable symptomatic atrial tachycardia  Secondary Discharge Diagnoses:  1. Atrial fibrillation s/p ablation August 2013 2. 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 Ascending aortic aneurysm, maximum diameter 4.9 cm by CT January 2013, followed by Mark [name] Anxiety 4. HTN 5. OSA 6. Normal coronary arteries s/p cardiac cath May 2009  Procedures This Admission:  None   History and Hospital Course:  Mark [name] is a 51 year old man with history of bicuspid aortic valve s/p mechanical AVR, ascending aortic aneurysm and atrial fibrillation s/p ablation August 2013 who was admitted yesterday with palpitations and near syncope. Mark [name] reports intermittent palpitations since his ablation and yesterday his racing palpitations occurred while hunting and were accompanied by SOB and near syncope. He denies CP. These symptoms made him quite anxious, prompting him to see medical attention. He was admitted overnight for observation. He has not had any recurrent palpitations, SOB or near syncope while here. He is waiting to receive a 30-day monitor which was previously ordered by Dr. [name] to evaluate these symptoms. On telemetry he has normal sinus rhythm with occasional PACs and nonsustained atrial tachycardia. His 12-lead ECGs show normal sinus rhythm with normal intervals. His CEs are negative. He remains hemodynamically stable. He feels well and is eager to go home. After further discussion this morning with Dr. [name] regarding treatment options, he has elected to continue medical therapy with rate control and his diltiazem dose will be up-titrated. He has been seen, examined  and deemed stable for discharge today by Mark [name].  Discharge Vitals: Blood pressure 119/70, pulse 59, temperature 97.9 F (36.6 C), temperature source Oral, resp. rate 15, height 5\' 11"  (1.803 m), weight 236 lb 9.6 oz (107.321 kg), SpO2 98.00%.   Labs: Lab Results  Component Value Date   WBC 5.8 08/18/2012   HGB 13.0 08/18/2012   HCT 37.9* 08/18/2012   MCV 88.1 08/18/2012   PLT 145* 08/18/2012     Lab 08/18/12 0458  NA 139  K 4.0  CL 105  CO2 25  BUN 19  CREATININE 1.13  CALCIUM 8.8  PROT --  BILITOT --  ALKPHOS --  ALT --  AST --  GLUCOSE 88    Basename 08/18/12 0458  INR 2.67*    Disposition:  The patient is being discharged in stable condition.  Follow-up:     Follow-up Information    Follow up with Mark [name]. On 08/29/2012. (At 7:45 AM for [name] follow-up (INR check))    Contact information:   [address] [place] [address] [phone]      Follow up with Hillis Range, MD. On 10/13/2012. (At 8:45 AM)    Contact information:   [address] [place] [address] [phone]       Discharge Medications:    Medication List     As of 08/18/2012  9:49 AM    TAKE these medications         aspirin EC 81 MG tablet   Take 81 mg by mouth daily.      diltiazem 180 MG 24 hr capsule   Commonly known as: TIAZAC   Take 1 capsule (180 mg total) by mouth 2 (  two) times daily.      lisinopril 20 MG tablet   Commonly known as: PRINIVIL,ZESTRIL   Take 20 mg by mouth at bedtime.      pantoprazole 40 MG tablet   Commonly known as: PROTONIX   Take 40 mg by mouth at bedtime.      simvastatin 40 MG tablet   Commonly known as: ZOCOR   Take 1 tablet (40 mg total) by mouth at bedtime.      warfarin 10 MG tablet   Commonly known as: [name]   Take 10 mg by mouth every evening.       Duration of Discharge Encounter: Greater than 30 minutes including physician time.  Limmie Patricia, PA-C 08/18/2012, 9:49 AM   Hillis Range, MD

## 2012-08-18 NOTE — Progress Notes (Signed)
DC orders received.  Patient stable with no S/S of distress.  Medication and DC information reviewed with patient.  Patient DC home with family. Nolon Nations
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
## 2012-08-18 NOTE — Progress Notes (Signed)
Utilization review completed.  P.J. Murlin Schrieber,RN,BSN Case Manager [phone]  
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
## 2012-08-29 ENCOUNTER — Ambulatory Visit (INDEPENDENT_AMBULATORY_CARE_PROVIDER_SITE_OTHER): Payer: Self-pay | Admitting: *Deleted

## 2012-08-29 DIAGNOSIS — I4891 Unspecified atrial fibrillation: Secondary | ICD-10-CM

## 2012-08-29 DIAGNOSIS — Z954 Presence of other heart-valve replacement: Secondary | ICD-10-CM

## 2012-08-29 DIAGNOSIS — Z952 Presence of prosthetic heart valve: Secondary | ICD-10-CM

## 2012-08-29 DIAGNOSIS — Z7901 Long term (current) use of anticoagulants: Secondary | ICD-10-CM

## 2012-08-29 LAB — POCT INR: INR: 5.1

## 2012-09-06 ENCOUNTER — Emergency Department (HOSPITAL_COMMUNITY)
Admission: EM | Admit: 2012-09-06 | Discharge: 2012-09-06 | Discharge status: Against Medical Advice | Payer: Self-pay | Attending: Emergency Medicine | Admitting: Emergency Medicine

## 2012-09-06 ENCOUNTER — Emergency Department (HOSPITAL_COMMUNITY): Payer: Self-pay

## 2012-09-06 ENCOUNTER — Encounter (HOSPITAL_COMMUNITY): Payer: Self-pay | Admitting: Emergency Medicine

## 2012-09-06 DIAGNOSIS — R002 Palpitations: Secondary | ICD-10-CM | POA: Insufficient documentation

## 2012-09-06 LAB — POCT I-STAT TROPONIN I: Troponin i, poc: 0 ng/mL (ref 0.00–0.08)

## 2012-09-06 LAB — BASIC METABOLIC PANEL
BUN: 19 mg/dL (ref 6–23)
CO2: 25 mEq/L (ref 19–32)
Calcium: 10 mg/dL (ref 8.4–10.5)
Chloride: 102 mEq/L (ref 96–112)
Creatinine, Ser: 1.22 mg/dL (ref 0.50–1.35)
GFR calc Af Amer: 79 mL/min — ABNORMAL LOW (ref >90)
GFR calc non Af Amer: 68 mL/min — ABNORMAL LOW (ref >90)
Glucose, Bld: 139 mg/dL — ABNORMAL HIGH (ref 70–99)
Potassium: 4 mEq/L (ref 3.5–5.1)
Sodium: 139 mEq/L (ref 135–145)

## 2012-09-06 LAB — CBC WITH DIFFERENTIAL/PLATELET
Basophils Absolute: 0 10*3/uL (ref 0.0–0.1)
Basophils Relative: 0 % (ref 0–1)
Eosinophils Absolute: 0.1 10*3/uL (ref 0.0–0.7)
Eosinophils Relative: 1 % (ref 0–5)
HCT: 41.3 % (ref 39.0–52.0)
Hemoglobin: 14.5 g/dL (ref 13.0–17.0)
Lymphocytes Relative: 26 % (ref 12–46)
Lymphs Abs: 1.8 10*3/uL (ref 0.7–4.0)
MCH: 30.9 pg (ref 26.0–34.0)
MCHC: 35.1 g/dL (ref 30.0–36.0)
MCV: 87.9 fL (ref 78.0–100.0)
Monocytes Absolute: 0.5 10*3/uL (ref 0.1–1.0)
Monocytes Relative: 7 % (ref 3–12)
Neutro Abs: 4.5 10*3/uL (ref 1.7–7.7)
Neutrophils Relative %: 66 % (ref 43–77)
Platelets: 173 10*3/uL (ref 150–400)
RBC: 4.7 MIL/uL (ref 4.22–5.81)
RDW: 12.4 % (ref 11.5–15.5)
WBC: 6.8 10*3/uL (ref 4.0–10.5)

## 2012-09-06 LAB — PROTIME-INR
INR: 2.32 — ABNORMAL HIGH (ref 0.00–1.49)
Prothrombin Time: 24.4 seconds — ABNORMAL HIGH (ref 11.6–15.2)

## 2012-09-06 NOTE — ED Notes (Signed)
Pt here with palpitations; pt sts hx of afib and sts feels same; pt denies pain and sts some intermittent SOB; pt seen here multiple times in past for same

## 2012-09-06 NOTE — ED Notes (Signed)
Pt st's he has converted from afib back to normal rhythm, st's he doesn't want to wait in the waiting room any longer.  Encouraged pt to stay and be evaluated but he chose to leave.

## 2012-09-09 ENCOUNTER — Inpatient Hospital Stay (HOSPITAL_COMMUNITY)
Admission: EM | Admit: 2012-09-09 | Discharge: 2012-09-12 | DRG: 310 | Disposition: A | Discharge status: Home / Self Care | Payer: No Typology Code available for payment source | Attending: Cardiology | Admitting: Cardiology

## 2012-09-09 ENCOUNTER — Other Ambulatory Visit: Payer: Self-pay

## 2012-09-09 ENCOUNTER — Encounter (HOSPITAL_COMMUNITY): Payer: Self-pay | Admitting: Emergency Medicine

## 2012-09-09 ENCOUNTER — Emergency Department (HOSPITAL_COMMUNITY): Payer: Self-pay

## 2012-09-09 DIAGNOSIS — N529 Male erectile dysfunction, unspecified: Secondary | ICD-10-CM

## 2012-09-09 DIAGNOSIS — Z7982 Long term (current) use of aspirin: Secondary | ICD-10-CM

## 2012-09-09 DIAGNOSIS — I330 Acute and subacute infective endocarditis: Secondary | ICD-10-CM

## 2012-09-09 DIAGNOSIS — Z7901 Long term (current) use of anticoagulants: Secondary | ICD-10-CM

## 2012-09-09 DIAGNOSIS — M79609 Pain in unspecified limb: Secondary | ICD-10-CM

## 2012-09-09 DIAGNOSIS — R51 Headache: Secondary | ICD-10-CM

## 2012-09-09 DIAGNOSIS — I4892 Unspecified atrial flutter: Principal | ICD-10-CM | POA: Diagnosis present

## 2012-09-09 DIAGNOSIS — J302 Other seasonal allergic rhinitis: Secondary | ICD-10-CM

## 2012-09-09 DIAGNOSIS — R002 Palpitations: Secondary | ICD-10-CM

## 2012-09-09 DIAGNOSIS — Z23 Encounter for immunization: Secondary | ICD-10-CM

## 2012-09-09 DIAGNOSIS — R509 Fever, unspecified: Secondary | ICD-10-CM

## 2012-09-09 DIAGNOSIS — I7121 Aneurysm of the ascending aorta, without rupture: Secondary | ICD-10-CM

## 2012-09-09 DIAGNOSIS — I4891 Unspecified atrial fibrillation: Secondary | ICD-10-CM

## 2012-09-09 DIAGNOSIS — Z954 Presence of other heart-valve replacement: Secondary | ICD-10-CM

## 2012-09-09 DIAGNOSIS — E785 Hyperlipidemia, unspecified: Secondary | ICD-10-CM

## 2012-09-09 DIAGNOSIS — S93409A Sprain of unspecified ligament of unspecified ankle, initial encounter: Secondary | ICD-10-CM

## 2012-09-09 DIAGNOSIS — I10 Essential (primary) hypertension: Secondary | ICD-10-CM | POA: Diagnosis present

## 2012-09-09 DIAGNOSIS — L089 Local infection of the skin and subcutaneous tissue, unspecified: Secondary | ICD-10-CM

## 2012-09-09 DIAGNOSIS — G4733 Obstructive sleep apnea (adult) (pediatric): Secondary | ICD-10-CM

## 2012-09-09 DIAGNOSIS — N10 Acute tubulo-interstitial nephritis: Secondary | ICD-10-CM

## 2012-09-09 DIAGNOSIS — I350 Nonrheumatic aortic (valve) stenosis: Secondary | ICD-10-CM

## 2012-09-09 DIAGNOSIS — Z91199 Patient's noncompliance with other medical treatment and regimen due to unspecified reason: Secondary | ICD-10-CM

## 2012-09-09 DIAGNOSIS — I2510 Atherosclerotic heart disease of native coronary artery without angina pectoris: Secondary | ICD-10-CM | POA: Diagnosis present

## 2012-09-09 DIAGNOSIS — R079 Chest pain, unspecified: Secondary | ICD-10-CM

## 2012-09-09 DIAGNOSIS — R5383 Other fatigue: Secondary | ICD-10-CM

## 2012-09-09 DIAGNOSIS — Z952 Presence of prosthetic heart valve: Secondary | ICD-10-CM

## 2012-09-09 DIAGNOSIS — I712 Thoracic aortic aneurysm, without rupture: Secondary | ICD-10-CM

## 2012-09-09 DIAGNOSIS — Z9119 Patient's noncompliance with other medical treatment and regimen: Secondary | ICD-10-CM

## 2012-09-09 DIAGNOSIS — I719 Aortic aneurysm of unspecified site, without rupture: Secondary | ICD-10-CM

## 2012-09-09 DIAGNOSIS — Z79899 Other long term (current) drug therapy: Secondary | ICD-10-CM

## 2012-09-09 HISTORY — DX: Supraventricular tachycardia: I47.1

## 2012-09-09 HISTORY — DX: Other supraventricular tachycardia: I47.19

## 2012-09-09 HISTORY — DX: Other specified postprocedural states: Z98.890

## 2012-09-09 LAB — CBC WITH DIFFERENTIAL/PLATELET
Basophils Absolute: 0 10*3/uL (ref 0.0–0.1)
Basophils Relative: 1 % (ref 0–1)
Eosinophils Absolute: 0.1 K/uL (ref 0.0–0.7)
Eosinophils Relative: 1 % (ref 0–5)
HCT: 45.4 % (ref 39.0–52.0)
Hemoglobin: 16.2 g/dL (ref 13.0–17.0)
Lymphocytes Relative: 27 % (ref 12–46)
Lymphs Abs: 2.3 10*3/uL (ref 0.7–4.0)
MCH: 30.7 pg (ref 26.0–34.0)
MCHC: 35.7 g/dL (ref 30.0–36.0)
MCV: 86.1 fL (ref 78.0–100.0)
Monocytes Absolute: 0.6 K/uL (ref 0.1–1.0)
Monocytes Relative: 8 % (ref 3–12)
Neutro Abs: 5.4 10*3/uL (ref 1.7–7.7)
Neutrophils Relative %: 64 % (ref 43–77)
Platelets: 171 10*3/uL (ref 150–400)
RBC: 5.27 MIL/uL (ref 4.22–5.81)
RDW: 12.3 % (ref 11.5–15.5)
WBC: 8.4 10*3/uL (ref 4.0–10.5)

## 2012-09-09 LAB — COMPREHENSIVE METABOLIC PANEL
ALT: 30 U/L (ref 0–53)
AST: 27 U/L (ref 0–37)
Albumin: 4.5 g/dL (ref 3.5–5.2)
Alkaline Phosphatase: 74 U/L (ref 39–117)
BUN: 19 mg/dL (ref 6–23)
CO2: 26 mEq/L (ref 19–32)
Calcium: 10 mg/dL (ref 8.4–10.5)
Chloride: 101 mEq/L (ref 96–112)
Creatinine, Ser: 1.09 mg/dL (ref 0.50–1.35)
GFR calc Af Amer: >90 mL/min (ref >90)
GFR calc non Af Amer: 78 mL/min — ABNORMAL LOW (ref >90)
Glucose, Bld: 128 mg/dL — ABNORMAL HIGH (ref 70–99)
Potassium: 4 mEq/L (ref 3.5–5.1)
Sodium: 140 mEq/L (ref 135–145)
Total Bilirubin: 0.8 mg/dL (ref 0.3–1.2)
Total Protein: 8.2 g/dL (ref 6.0–8.3)

## 2012-09-09 LAB — POCT I-STAT TROPONIN I: Troponin i, poc: 0.01 ng/mL (ref 0.00–0.08)

## 2012-09-09 LAB — PROTIME-INR
INR: 2.14 — ABNORMAL HIGH (ref 0.00–1.49)
Prothrombin Time: 23 seconds — ABNORMAL HIGH (ref 11.6–15.2)

## 2012-09-09 LAB — PRO B NATRIURETIC PEPTIDE: Pro B Natriuretic peptide (BNP): 16.8 pg/mL (ref 0–125)

## 2012-09-09 LAB — MAGNESIUM: Magnesium: 2.3 mg/dL (ref 1.5–2.5)

## 2012-09-09 MED ORDER — ASPIRIN 325 MG PO TABS
325.0000 mg | ORAL_TABLET | Freq: Once | ORAL | Status: AC
  Administered 2012-09-09: 325 mg via ORAL
  Filled 2012-09-09: qty 1

## 2012-09-09 MED ORDER — ATORVASTATIN CALCIUM 20 MG PO TABS
20.0000 mg | ORAL_TABLET | Freq: Every day | ORAL | Status: DC
  Filled 2012-09-09: qty 1

## 2012-09-09 MED ORDER — SODIUM CHLORIDE 0.9 % IJ SOLN
3.0000 mL | Freq: Two times a day (BID) | INTRAMUSCULAR | Status: DC
  Administered 2012-09-09 – 2012-09-11 (×5): 3 mL via INTRAVENOUS

## 2012-09-09 MED ORDER — NITROGLYCERIN 0.4 MG SL SUBL: 0.4000 mg | SUBLINGUAL_TABLET | SUBLINGUAL | Status: DC | PRN

## 2012-09-09 MED ORDER — DOFETILIDE 500 MCG PO CAPS
500.0000 ug | ORAL_CAPSULE | Freq: Two times a day (BID) | ORAL | Status: DC
  Administered 2012-09-09 – 2012-09-12 (×6): 500 ug via ORAL
  Filled 2012-09-09 (×7): qty 1

## 2012-09-09 MED ORDER — ONDANSETRON HCL 4 MG/2ML IJ SOLN: 4.0000 mg | Freq: Four times a day (QID) | INTRAMUSCULAR | Status: DC | PRN

## 2012-09-09 MED ORDER — ENOXAPARIN SODIUM 120 MG/0.8ML ~~LOC~~ SOLN
110.0000 mg | Freq: Once | SUBCUTANEOUS | Status: AC
  Administered 2012-09-09: 110 mg via SUBCUTANEOUS
  Filled 2012-09-09: qty 0.8

## 2012-09-09 MED ORDER — WARFARIN - PHARMACIST DOSING INPATIENT: Freq: Every day | Status: DC

## 2012-09-09 MED ORDER — DILTIAZEM HCL ER COATED BEADS 180 MG PO CP24
180.0000 mg | ORAL_CAPSULE | Freq: Two times a day (BID) | ORAL | Status: DC
  Administered 2012-09-10 – 2012-09-12 (×5): 180 mg via ORAL
  Filled 2012-09-09 (×6): qty 1

## 2012-09-09 MED ORDER — ACETAMINOPHEN 325 MG PO TABS
650.0000 mg | ORAL_TABLET | ORAL | Status: DC | PRN
  Administered 2012-09-11: 650 mg via ORAL
  Filled 2012-09-09: qty 2

## 2012-09-09 MED ORDER — ASPIRIN EC 81 MG PO TBEC
81.0000 mg | DELAYED_RELEASE_TABLET | Freq: Every day | ORAL | Status: DC
  Administered 2012-09-10 – 2012-09-12 (×3): 81 mg via ORAL
  Filled 2012-09-09 (×3): qty 1

## 2012-09-09 MED ORDER — DILTIAZEM HCL ER BEADS 180 MG PO CP24: 180.0000 mg | ORAL_CAPSULE | Freq: Two times a day (BID) | ORAL | Status: DC

## 2012-09-09 MED ORDER — WARFARIN SODIUM 7.5 MG PO TABS
15.0000 mg | ORAL_TABLET | Freq: Once | ORAL | Status: AC
  Administered 2012-09-09: 15 mg via ORAL
  Filled 2012-09-09: qty 2

## 2012-09-09 MED ORDER — LISINOPRIL 20 MG PO TABS
20.0000 mg | ORAL_TABLET | Freq: Every day | ORAL | Status: DC
  Administered 2012-09-09 – 2012-09-11 (×3): 20 mg via ORAL
  Filled 2012-09-09 (×4): qty 1

## 2012-09-09 MED ORDER — ZOLPIDEM TARTRATE 5 MG PO TABS: 5.0000 mg | ORAL_TABLET | Freq: Every evening | ORAL | Status: DC | PRN

## 2012-09-09 MED ORDER — SODIUM CHLORIDE 0.9 % IV SOLN: 250.0000 mL | INTRAVENOUS | Status: DC | PRN

## 2012-09-09 MED ORDER — PANTOPRAZOLE SODIUM 40 MG PO TBEC
40.0000 mg | DELAYED_RELEASE_TABLET | Freq: Every day | ORAL | Status: DC
  Administered 2012-09-09 – 2012-09-11 (×3): 40 mg via ORAL
  Filled 2012-09-09 (×3): qty 1

## 2012-09-09 MED ORDER — ENOXAPARIN SODIUM 120 MG/0.8ML ~~LOC~~ SOLN
110.0000 mg | Freq: Two times a day (BID) | SUBCUTANEOUS | Status: DC
  Administered 2012-09-10 – 2012-09-11 (×4): 110 mg via SUBCUTANEOUS
  Filled 2012-09-09 (×6): qty 0.8

## 2012-09-09 MED ORDER — ALPRAZOLAM 0.25 MG PO TABS: 0.2500 mg | ORAL_TABLET | Freq: Two times a day (BID) | ORAL | Status: DC | PRN

## 2012-09-09 MED ORDER — SODIUM CHLORIDE 0.9 % IJ SOLN: 3.0000 mL | INTRAMUSCULAR | Status: DC | PRN

## 2012-09-09 MED ORDER — SIMVASTATIN 40 MG PO TABS: 40.0000 mg | ORAL_TABLET | Freq: Every day | ORAL | Status: DC

## 2012-09-09 NOTE — ED Notes (Signed)
Pt eating/wife at bedside
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
## 2012-09-09 NOTE — Progress Notes (Signed)
Pharmacy Consult for Dofetilide (Tikosyn) Iniation  Admit Complaint: 52 y.o. male admitted 09/09/2012 with atrial fibrillation to be initiated on dofetilide.   Assessment:  Patient Exclusion Criteria: If any screening criteria checked as "Yes", then  patient  should NOT receive dofetilide until criteria item is corrected. If "Yes" please indicate correction plan.  YES  NO Patient  Exclusion Criteria Correction Plan  []  [x]  Baseline QTc interval is greater than or equal to 440 msec. IF above YES box checked dofetilide contraindicated unless patient has ICD; then may proceed if QTc 500-550 msec or with known ventricular conduction abnormalities may proceed with QTc 550-600 msec. QTc = 404   []  [x]  Magnesium level is less than 1.8 mEq/l : Last magnesium:  Lab Results  Component Value Date   MG 2.3 09/09/2012         []  [x]  Potassium level is less than 4 mEq/l : Last potassium:  Lab Results  Component Value Date   K 4.0 09/09/2012         []  [x]  Patient is known or suspected to have a digoxin level greater than 2 ng/ml: No results found for this basename: DIGOXIN      []  [x]  Creatinine clearance less than 20 ml/min (calculated using Cockcroft-Gault, actual body weight and serum creatinine): CrCl~ 80 ml/min   []  [x]  Patient has received drugs known to prolong the QT intervals within the last 48 hours(phenothiazines, tricyclics or tetracyclic antidepressants, erythromycin, H-1 antihistamines, cisapride, fluoroquinolones, azithromycin). Drugs not listed above may have an, as yet, undetected potential to prolong the QT interval, updated information on QT prolonging agents is available at this website:QT prolonging agents   []  [x]  Patient received a dose of hydrochlorothiazide (Oretic) alone or in any combination including triamterene (Dyazide, Maxzide) in the last 48 hours.   []  [x]  Patient received a medication known to increase dofetilide plasma concentrations prior to initial dofetilide  dose:    Trimethoprim (Primsol, Proloprim) in the last 36 hours   Verapamil (Calan, Verelan) in the last 36 hours or a sustained release dose in the last 72 hours   Megestrol (Megace) in the last 5 days    Cimetidine (Tagamet) in the last 6 hours   Ketoconazole (Nizoral) in the last 24 hours   Itraconazole (Sporanox) in the last 48 hours    Prochlorperazine (Compazine) in the last 36 hours    []  [x]  Patient is known to have a history of torsades de pointes; congenital or acquired long QT syndromes.   []  [x]  Patient has received a Class 1 antiarrhythmic with less than 2 half-lives since last dose. (Disopyramide, Quinidine, Procainamide, Lidocaine, Mexiletine, Flecainide, Propafenone)   []  [x]  Patient has received amiodarone therapy in the past 3 months or amiodarone level is greater than 0.3 ng/ml.    Patient has been appropriately anticoagulated with lovenox.  Ordering provider was confirmed at [email or web address] if they are not listed on the [facility] Authorized Prescribers list.  Goal of Therapy:  Follow renal function, electrolytes, potential drug interactions, and dose adjustment. Provide education and 1 week supply at discharge.  Plan:  1.  Initiate dofetilide based on renal function: Select One Calculated CrCl  Dose q12h  [x]  > 60 ml/min 500 mcg  []  40-60 ml/min 250 mcg  []  20-40 ml/min 125 mcg   2. Follow up QTc after the first 5 doses, renal function, electrolytes (K & Mg) daily x 3 days, dose adjustment, success of initiation and facilitate 1 week discharge supply as clinically  indicated.  3. Initiate Tikosyn education video (Call [phone] and ask for video # [phone]).  4. Place Enrollment Form on the chart for discharge supply of dofetilide.  Harland German, Pharm D 09/09/2012 10:48 PM

## 2012-09-09 NOTE — ED Notes (Signed)
Pt is currently wearing a heart monitor due to A-Fib.  St's today he was just standing and heart started to race.  Pt was called and told to come to ED.  Pt denies chest pain but has continued to have shortness of breath since onset at approx 4pm today
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
## 2012-09-10 DIAGNOSIS — I4891 Unspecified atrial fibrillation: Secondary | ICD-10-CM

## 2012-09-10 LAB — PROTIME-INR
INR: 2.39 — ABNORMAL HIGH (ref 0.00–1.49)
Prothrombin Time: 25 seconds — ABNORMAL HIGH (ref 11.6–15.2)

## 2012-09-10 LAB — CBC
HCT: 41.7 % (ref 39.0–52.0)
Hemoglobin: 14.2 g/dL (ref 13.0–17.0)
MCH: 29.9 pg (ref 26.0–34.0)
MCHC: 34.1 g/dL (ref 30.0–36.0)
MCV: 87.8 fL (ref 78.0–100.0)
Platelets: 158 10*3/uL (ref 150–400)
RBC: 4.75 MIL/uL (ref 4.22–5.81)
RDW: 12.5 % (ref 11.5–15.5)
WBC: 6.2 10*3/uL (ref 4.0–10.5)

## 2012-09-10 LAB — BASIC METABOLIC PANEL
BUN: 19 mg/dL (ref 6–23)
CO2: 24 mEq/L (ref 19–32)
Calcium: 9.2 mg/dL (ref 8.4–10.5)
Chloride: 104 mEq/L (ref 96–112)
Creatinine, Ser: 1.05 mg/dL (ref 0.50–1.35)
GFR calc Af Amer: >90 mL/min (ref >90)
GFR calc non Af Amer: 82 mL/min — ABNORMAL LOW (ref >90)
Glucose, Bld: 104 mg/dL — ABNORMAL HIGH (ref 70–99)
Potassium: 4.4 mEq/L (ref 3.5–5.1)
Sodium: 138 mEq/L (ref 135–145)

## 2012-09-10 LAB — MAGNESIUM: Magnesium: 2.3 mg/dL (ref 1.5–2.5)

## 2012-09-10 LAB — MRSA PCR SCREENING: MRSA by PCR: POSITIVE — AB

## 2012-09-10 MED ORDER — WARFARIN SODIUM 10 MG PO TABS
10.0000 mg | ORAL_TABLET | Freq: Once | ORAL | Status: AC
  Administered 2012-09-10: 10 mg via ORAL
  Filled 2012-09-10: qty 1

## 2012-09-10 MED ORDER — ATORVASTATIN CALCIUM 20 MG PO TABS
20.0000 mg | ORAL_TABLET | Freq: Every day | ORAL | Status: DC
  Filled 2012-09-10 (×3): qty 1

## 2012-09-10 NOTE — ED Provider Notes (Signed)
I saw and evaluated the patient, reviewed the resident's note and I agree with the findings and plan.  Pt with history of paroxysmal atrial fibrillation status post ablation procedure by Dr. Johney Frame. Following the procedure, the patient remained on Coumadin, Cardizem and Tikosyn. Patient was taken off his Tikosyn and Cardizem reduced to once per day, however he began having episodes of a 2 fibrillation again. He had been on a Holter monitor if and prior to tonight presentation, had one episode of atrial fibrillation 2 days prior which resolved on its own. Today he had several episodes and most recently it had a two-hour episode which prompted his presentation to the ED. He did endorse some anxiety, heart palpitations and minimal dyspnea. He denies any chest pain. At the time previously, they did go up on his Cardizem to twice a day and was pending another outpatient followup. Patient does endorse some frustration with his atrial fibrillation issues. Shortly after arrival to the emergency department, his atrial fibrillation is now resolved. No further chest pain or shortness of breath at present.  On exam the patient is in no distress, heart rate is regular rate and rhythm. I did review the patient's EKG myself and agree with the interpretation by Dr. March Rummage. We have discussed with the [facility] cardiology who has since been and evaluated the patient and will admit the patient for further treatment.  Gavin Pound. Oletta Lamas, MD 09/10/12 2302

## 2012-09-10 NOTE — Progress Notes (Signed)
   Primary cardiologist: Dr. Hillis Range  Subjective:   No chest pain, dizziness, palpitations.   Objective:   Temp:  [97.6 F (36.4 C)-98.6 F (37 C)] 98.6 F (37 C) (01/11 0451) Pulse Rate:  [66-114] 67  (01/11 0451) Resp:  [15-22] 19  (01/11 0451) BP: (101-154)/(54-101) 101/77 mmHg (01/11 0451) SpO2:  [93 %-100 %] 93 % (01/11 0451) Weight:  [237 lb 3.2 oz (107.593 kg)] 237 lb 3.2 oz (107.593 kg) (01/10 2336) Last BM Date: 09/09/12  Filed Weights   09/09/12 2336  Weight: 237 lb 3.2 oz (107.593 kg)    Intake/Output Summary (Last 24 hours) at 09/10/12 1045 Last data filed at 09/09/12 1907  Gross per 24 hour  Intake      0 ml  Output    525 ml  Net   -525 ml   Telemetry: Sinus rhythm.  Exam:  General: NAD.  Lungs: Clear, nonlabored.  Cardiac: RRR, crisp prosthetic aortic click.  Extremities: No edema.  Lab Results:  Basic Metabolic Panel:  Lab 09/10/12 1610 09/09/12 2029 09/09/12 1750 09/06/12 1838  NA 138 -- 140 139  K 4.4 -- 4.0 4.0  CL 104 -- 101 102  CO2 24 -- 26 25  GLUCOSE 104* -- 128* 139*  BUN 19 -- 19 19  CREATININE 1.05 -- 1.09 1.22  CALCIUM 9.2 -- 10.0 10.0  MG 2.3 2.3 -- --    Liver Function Tests:  Lab 09/09/12 1750  AST 27  ALT 30  ALKPHOS 74  BILITOT 0.8  PROT 8.2  ALBUMIN 4.5    CBC:  Lab 09/10/12 0448 09/09/12 1750 09/06/12 1838  WBC 6.2 8.4 6.8  HGB 14.2 16.2 14.5  HCT 41.7 45.4 41.3  MCV 87.8 86.1 87.9  PLT 158 171 173    Coagulation:  Lab 09/10/12 0448 09/09/12 1830 09/06/12 1838  INR 2.39* 2.14* 2.32*     Medications:   Scheduled Medications:    . aspirin EC  81 mg Oral Daily  . atorvastatin  20 mg Oral q1800  . diltiazem  180 mg Oral BID  . dofetilide  500 mcg Oral Q12H  . enoxaparin (LOVENOX) injection  110 mg Subcutaneous Q12H  . lisinopril  20 mg Oral QHS  . pantoprazole  40 mg Oral QHS  . sodium chloride  3 mL Intravenous Q12H  . Warfarin - Pharmacist Dosing Inpatient   Does not apply q1800       PRN Medications:  sodium chloride, acetaminophen, ALPRAZolam, nitroGLYCERIN, ondansetron (ZOFRAN) IV, sodium chloride, zolpidem   Assessment:   1. Paroxysmal atrial fibrillation, now admitted for Tikosyn initiation, pharmacy involved. He is in sinus rhythm at this time. Got his first dose last evening. Potassium and magnesium normal as was initial QTc.  2. History of bicuspid aortic valve status post mechanical St. Jude prosthesis in 1983, continue anticoagulation.  Plan/Discussion:    Continue to follow daily potassium and renal function, magnesium, and ECG. Pharmacy managing Coumadin as well as involved with Tikosyn dosing.   Jonelle Sidle, M.D., F.A.C.C.

## 2012-09-10 NOTE — Progress Notes (Signed)
  Echocardiogram 2D Echocardiogram has been performed.  Mark [name] FRANCES 09/10/2012, 5:04 PM 

## 2012-09-10 NOTE — Progress Notes (Signed)
ANTICOAGULATION CONSULT NOTE - Follow Up Consult  Pharmacy Consult for lovenox and coumadin Indication: Afib + St. Jude mechanical valve    Allergies  Allergen Reactions  . Morphine And Related Nausea And Vomiting  . Percocet (Oxycodone-Acetaminophen) Nausea And Vomiting    Patient Measurements: Height: 5\' 11"  (180.3 cm) Weight: 237 lb 3.2 oz (107.593 kg) IBW/kg (Calculated) : 75.3    Vital Signs: Temp: 98.6 F (37 C) (01/11 0451) Temp src: Oral (01/11 0451) BP: 101/77 mmHg (01/11 0451) Pulse Rate: 67  (01/11 0451)  Labs:  Basename 09/10/12 0448 09/09/12 1830 09/09/12 1750  HGB 14.2 -- 16.2  HCT 41.7 -- 45.4  PLT 158 -- 171  APTT -- -- --  LABPROT 25.0* 23.0* --  INR 2.39* 2.14* --  HEPARINUNFRC -- -- --  CREATININE 1.05 -- 1.09  CKTOTAL -- -- --  CKMB -- -- --  TROPONINI -- -- --    Estimated Creatinine Clearance: 106.2 ml/min (by C-G formula based on Cr of 1.05).  Assessment: Patient is a 52 y.o M on anticoagulation for afib and mechanical AVR.  Per [facility]-coumadin clinic's note, INR goal for patient is 3.5-4.5.  INR is subtherapeutic at 2.39 today but appears to be trending up after 15mg  dose given last night.    Goal of Therapy:  INR= 3.5-4.5 Monitor platelets by anticoagulation protocol: Yes   Plan:  1) no change for lovenox 2) coumadin 10mg  PO x1 today  Tinita Brooker P 09/10/2012,10:56 AM

## 2012-09-11 DIAGNOSIS — Z954 Presence of other heart-valve replacement: Secondary | ICD-10-CM

## 2012-09-11 LAB — CBC
HCT: 41.6 % (ref 39.0–52.0)
Hemoglobin: 13.7 g/dL (ref 13.0–17.0)
MCH: 29.1 pg (ref 26.0–34.0)
MCHC: 32.9 g/dL (ref 30.0–36.0)
MCV: 88.3 fL (ref 78.0–100.0)
Platelets: 160 10*3/uL (ref 150–400)
RBC: 4.71 MIL/uL (ref 4.22–5.81)
RDW: 12.4 % (ref 11.5–15.5)
WBC: 5.3 10*3/uL (ref 4.0–10.5)

## 2012-09-11 LAB — MAGNESIUM: Magnesium: 2.3 mg/dL (ref 1.5–2.5)

## 2012-09-11 LAB — BASIC METABOLIC PANEL
BUN: 18 mg/dL (ref 6–23)
CO2: 21 mEq/L (ref 19–32)
Calcium: 9.5 mg/dL (ref 8.4–10.5)
Chloride: 102 mEq/L (ref 96–112)
Creatinine, Ser: 1.15 mg/dL (ref 0.50–1.35)
GFR calc Af Amer: 85 mL/min — ABNORMAL LOW (ref >90)
GFR calc non Af Amer: 73 mL/min — ABNORMAL LOW (ref >90)
Glucose, Bld: 102 mg/dL — ABNORMAL HIGH (ref 70–99)
Potassium: 4.7 mEq/L (ref 3.5–5.1)
Sodium: 136 mEq/L (ref 135–145)

## 2012-09-11 LAB — PROTIME-INR
INR: 3.27 — ABNORMAL HIGH (ref 0.00–1.49)
Prothrombin Time: 31.5 seconds — ABNORMAL HIGH (ref 11.6–15.2)

## 2012-09-11 MED ORDER — WARFARIN SODIUM 2.5 MG PO TABS
2.5000 mg | ORAL_TABLET | Freq: Once | ORAL | Status: AC
  Administered 2012-09-11: 2.5 mg via ORAL
  Filled 2012-09-11 (×2): qty 1

## 2012-09-11 NOTE — Progress Notes (Signed)
   Primary cardiologist: Dr. Hillis Range  Subjective:   No complaints. Feels well.   Objective:   Temp:  [98 F (36.7 C)-99.5 F (37.5 C)] 98 F (36.7 C) (01/12 0548) Pulse Rate:  [54-66] 54  (01/12 0548) Resp:  [16-20] 18  (01/12 0548) BP: (93-114)/(54-75) 93/54 mmHg (01/12 0548) SpO2:  [95 %-96 %] 96 % (01/12 0548) Last BM Date: 09/10/12  Filed Weights   09/09/12 2336  Weight: 237 lb 3.2 oz (107.593 kg)    Intake/Output Summary (Last 24 hours) at 09/11/12 0807 Last data filed at 09/10/12 1300  Gross per 24 hour  Intake    240 ml  Output      0 ml  Net    240 ml   Telemetry: Sinus rhythm.  Exam:  General: NAD.  Lungs: Clear, nonlabored.  Cardiac: RRR, crisp prosthetic aortic click.  Extremities: No edema.  Lab Results:  Basic Metabolic Panel:  Lab 09/10/12 4098 09/09/12 2029 09/09/12 1750 09/06/12 1838  NA 138 -- 140 139  K 4.4 -- 4.0 4.0  CL 104 -- 101 102  CO2 24 -- 26 25  GLUCOSE 104* -- 128* 139*  BUN 19 -- 19 19  CREATININE 1.05 -- 1.09 1.22  CALCIUM 9.2 -- 10.0 10.0  MG 2.3 2.3 -- --    Liver Function Tests:  Lab 09/09/12 1750  AST 27  ALT 30  ALKPHOS 74  BILITOT 0.8  PROT 8.2  ALBUMIN 4.5    CBC:  Lab 09/11/12 0656 09/10/12 0448 09/09/12 1750  WBC 5.3 6.2 8.4  HGB 13.7 14.2 16.2  HCT 41.6 41.7 45.4  MCV 88.3 87.8 86.1  PLT 160 158 171    Coagulation:  Lab 09/11/12 0656 09/10/12 0448 09/09/12 1830  INR 3.27* 2.39* 2.14*   ECG 1/12: Sinus bradycardia with QRS widening, QTc 407 ms.  Medications:   Scheduled Medications:    . aspirin EC  81 mg Oral Daily  . atorvastatin  20 mg Oral q1800  . diltiazem  180 mg Oral BID  . dofetilide  500 mcg Oral Q12H  . enoxaparin (LOVENOX) injection  110 mg Subcutaneous Q12H  . lisinopril  20 mg Oral QHS  . pantoprazole  40 mg Oral QHS  . sodium chloride  3 mL Intravenous Q12H  . Warfarin - Pharmacist Dosing Inpatient   Does not apply q1800    PRN Medications: sodium chloride,  acetaminophen, ALPRAZolam, nitroGLYCERIN, ondansetron (ZOFRAN) IV, sodium chloride, zolpidem   Assessment:   1. Paroxysmal atrial fibrillation, now admitted for Tikosyn initiation, pharmacy involved. He is in sinus rhythm at this time. Got his first dose on 1/10 PM.  2. History of bicuspid aortic valve status post mechanical St. Jude prosthesis in 1983, continue anticoagulation. Followup echocardiogram pending.  Plan/Discussion:    Electrolytes pending this morning, ECG reviewed with stable QTc. Pharmacy managing Coumadin as well as involved with Tikosyn dosing. Likely home a.m.   Jonelle Sidle, M.D., F.A.C.C.

## 2012-09-11 NOTE — Progress Notes (Signed)
ANTICOAGULATION CONSULT NOTE - Follow Up Consult  Pharmacy Consult for lovenox and coumadin; Tikosyn Indication: Afib + St. Jude mechanical valve    Allergies  Allergen Reactions  . Morphine And Related Nausea And Vomiting  . Percocet (Oxycodone-Acetaminophen) Nausea And Vomiting    Patient Measurements: Height: 5\' 11"  (180.3 cm) Weight: 237 lb 3.2 oz (107.593 kg) IBW/kg (Calculated) : 75.3    Vital Signs: Temp: 98 F (36.7 C) (01/12 0548) Temp src: Oral (01/12 0548) BP: 93/54 mmHg (01/12 0548) Pulse Rate: 54  (01/12 0548)  Labs:  Basename 09/11/12 0656 09/10/12 0448 09/09/12 1830 09/09/12 1750  HGB 13.7 14.2 -- --  HCT 41.6 41.7 -- 45.4  PLT 160 158 -- 171  APTT -- -- -- --  LABPROT 31.5* 25.0* 23.0* --  INR 3.27* 2.39* 2.14* --  HEPARINUNFRC -- -- -- --  CREATININE 1.15 1.05 -- 1.09  CKTOTAL -- -- -- --  CKMB -- -- -- --  TROPONINI -- -- -- --    Estimated Creatinine Clearance: 96.9 ml/min (by C-G formula based on Cr of 1.15).  Assessment: Patient is a 52 y.o M on anticoagulation for afib and St. Jude AVR.  INR is almost at goal but increased sharply from 2.39 to 3.27.  Patient is also on Tikosyn.  Renal function is stable, electrolytes are wnl, and QTC 407.   Goal of Therapy:  Anti-Xa level 0.6-1.2 units/ml 4hrs after LMWH dose given; INR 3.5-4.5 Monitor platelets by anticoagulation protocol: Yes   Plan:  1) reduce coumadin dose to 2.5mg  PO x1 today 2) no change for lovenox 3) cont Tikosyn 500mg  Q12h  Geena Weinhold P 09/11/2012,10:47 AM

## 2012-09-12 LAB — CBC
HCT: 43.3 % (ref 39.0–52.0)
Hemoglobin: 14.9 g/dL (ref 13.0–17.0)
MCH: 30.3 pg (ref 26.0–34.0)
MCHC: 34.4 g/dL (ref 30.0–36.0)
MCV: 88.2 fL (ref 78.0–100.0)
Platelets: 158 10*3/uL (ref 150–400)
RBC: 4.91 MIL/uL (ref 4.22–5.81)
RDW: 12.5 % (ref 11.5–15.5)
WBC: 5.7 10*3/uL (ref 4.0–10.5)

## 2012-09-12 LAB — BASIC METABOLIC PANEL
BUN: 20 mg/dL (ref 6–23)
CO2: 27 mEq/L (ref 19–32)
Calcium: 9.4 mg/dL (ref 8.4–10.5)
Chloride: 102 mEq/L (ref 96–112)
Creatinine, Ser: 1.23 mg/dL (ref 0.50–1.35)
GFR calc Af Amer: 78 mL/min — ABNORMAL LOW (ref >90)
GFR calc non Af Amer: 67 mL/min — ABNORMAL LOW (ref >90)
Glucose, Bld: 101 mg/dL — ABNORMAL HIGH (ref 70–99)
Potassium: 4.7 mEq/L (ref 3.5–5.1)
Sodium: 139 mEq/L (ref 135–145)

## 2012-09-12 LAB — MAGNESIUM: Magnesium: 2.3 mg/dL (ref 1.5–2.5)

## 2012-09-12 LAB — PROTIME-INR
INR: 2.54 — ABNORMAL HIGH (ref 0.00–1.49)
Prothrombin Time: 26.1 seconds — ABNORMAL HIGH (ref 11.6–15.2)

## 2012-09-12 MED ORDER — DOFETILIDE 500 MCG PO CAPS: 500.0000 ug | ORAL_CAPSULE | Freq: Two times a day (BID) | ORAL | Status: DC

## 2012-09-12 MED ORDER — LISINOPRIL 20 MG PO TABS: 20.0000 mg | ORAL_TABLET | Freq: Every day | ORAL | Status: DC

## 2012-09-12 MED ORDER — WARFARIN SODIUM 2.5 MG PO TABS
12.5000 mg | ORAL_TABLET | Freq: Once | ORAL | Status: DC
  Filled 2012-09-12: qty 1

## 2012-09-12 MED ORDER — WARFARIN SODIUM 10 MG PO TABS: ORAL_TABLET | ORAL | Status: DC

## 2012-09-12 MED ORDER — SIMVASTATIN 40 MG PO TABS: 40.0000 mg | ORAL_TABLET | Freq: Every day | ORAL | Status: DC

## 2012-09-12 MED ORDER — SIMVASTATIN 40 MG PO TABS: 20.0000 mg | ORAL_TABLET | Freq: Every day | ORAL | Status: DC

## 2012-09-12 MED ORDER — DILTIAZEM HCL ER BEADS 180 MG PO CP24: 180.0000 mg | ORAL_CAPSULE | Freq: Two times a day (BID) | ORAL | Status: DC

## 2012-09-12 MED ORDER — PANTOPRAZOLE SODIUM 40 MG PO TBEC: 40.0000 mg | DELAYED_RELEASE_TABLET | Freq: Every day | ORAL | Status: DC

## 2012-09-12 MED ORDER — ATORVASTATIN CALCIUM 20 MG PO TABS: 20.0000 mg | ORAL_TABLET | Freq: Every day | ORAL | Status: DC

## 2012-09-12 NOTE — Progress Notes (Signed)
   Primary cardiologist: Dr. Hillis Range  Subjective:   No chest pain, dizziness, palpitations or SOB.   Objective:   Temp:  [97.7 F (36.5 C)-98.1 F (36.7 C)] 98.1 F (36.7 C) (01/13 0554) Pulse Rate:  [55-88] 55  (01/13 0554) Resp:  [20] 20  (01/13 0554) BP: (93-124)/(55-76) 93/55 mmHg (01/13 0554) SpO2:  [96 %] 96 % (01/13 0554) Last BM Date: 09/10/12  Filed Weights   09/09/12 2336  Weight: 237 lb 3.2 oz (107.593 kg)    GEN- The patient is well appearing, alert and oriented x 3 today.   Head- normocephalic, atraumatic Eyes-  Sclera clear, conjunctiva pink Ears- hearing intact Oropharynx- clear Neck- supple, no JVP Lymph- no cervical lymphadenopathy Lungs- Clear to ausculation bilaterally, normal work of breathing Heart- Regular rate and rhythm, mechanical S2 GI- soft, NT, ND, + BS Extremities- no clubbing, cyanosis, or edema MS- no significant deformity or atrophy Skin- no rash or lesion Psych- euthymic mood, full affect Neuro- strength and sensation are intact  Lab Results:  Basic Metabolic Panel:  Lab 09/11/12 6213 09/10/12 0448 09/09/12 2029 09/09/12 1750  NA 136 138 -- 140  K 4.7 4.4 -- 4.0  CL 102 104 -- 101  CO2 21 24 -- 26  GLUCOSE 102* 104* -- 128*  BUN 18 19 -- 19  CREATININE 1.15 1.05 -- 1.09  CALCIUM 9.5 9.2 -- 10.0  MG 2.3 2.3 2.3 --    Liver Function Tests:  Lab 09/09/12 1750  AST 27  ALT 30  ALKPHOS 74  BILITOT 0.8  PROT 8.2  ALBUMIN 4.5    CBC:  Lab 09/12/12 0650 09/11/12 0656 09/10/12 0448  WBC 5.7 5.3 6.2  HGB 14.9 13.7 14.2  HCT 43.3 41.6 41.7  MCV 88.2 88.3 87.8  PLT 158 160 158    Coagulation:  Lab 09/12/12 0650 09/11/12 0656 09/10/12 0448  INR 2.54* 3.27* 2.39*     Medications:   Scheduled Medications:    . aspirin EC  81 mg Oral Daily  . atorvastatin  20 mg Oral q1800  . diltiazem  180 mg Oral BID  . dofetilide  500 mcg Oral Q12H  . lisinopril  20 mg Oral QHS  . pantoprazole  40 mg Oral QHS  .  sodium chloride  3 mL Intravenous Q12H  . Warfarin - Pharmacist Dosing Inpatient   Does not apply q1800    PRN Medications: sodium chloride, acetaminophen, ALPRAZolam, nitroGLYCERIN, ondansetron (ZOFRAN) IV, sodium chloride, zolpidem   Assessment/ Plan:   1. Paroxysmal atrial fibrillation, now admitted for Tikosyn initiation, QT remains stable Will discharge after this am's dose Continue coumadin long term  2. History of bicuspid aortic valve status post mechanical St. Jude prosthesis in 1983, continue anticoagulation. He reports that his typical coumadin dose is 10mg  daily. He will take 15mg  daily x 2 days, then return to 10mg  daily.  He will need to return to the coumadin clinic this Friday for repeat INR check.  BMET, Mg, and EKG on Friday.  INR in the coumadin clinic on Friday. Follow-up with me in 4 weeks as scheduled  DC to home today

## 2012-09-12 NOTE — Progress Notes (Signed)
ANTICOAGULATION CONSULT NOTE - Follow Up Consult  Pharmacy Consult : Coumadin, Tikosyn Indication: Afib + St. Jude mechanical valve    Allergies  Allergen Reactions  . Morphine And Related Nausea And Vomiting  . Percocet (Oxycodone-Acetaminophen) Nausea And Vomiting    Patient Measurements: Height: 5\' 11"  (180.3 cm) Weight: 237 lb 3.2 oz (107.593 kg) IBW/kg (Calculated) : 75.3    Vital Signs: Temp: 98.1 F (36.7 C) (01/13 0554) Temp src: Oral (01/13 0554) BP: 93/55 mmHg (01/13 0554) Pulse Rate: 55  (01/13 0554)  Labs:  Basename 09/12/12 0650 09/11/12 0656 09/10/12 0448  HGB 14.9 13.7 --  HCT 43.3 41.6 41.7  PLT 158 160 158  APTT -- -- --  LABPROT 26.1* 31.5* 25.0*  INR 2.54* 3.27* 2.39*  HEPARINUNFRC -- -- --  CREATININE 1.23 1.15 1.05  CKTOTAL -- -- --  CKMB -- -- --  TROPONINI -- -- --    Estimated Creatinine Clearance: 90.6 ml/min (by C-G formula based on Cr of 1.23).  Assessment: Afib>>initiate Tikosyn Patient is a 51 y.o M on anticoagulation for afib and St. Jude AVR.  PMH: AAA, aortic stenosis, HTN, OSA, afib, Hep A  Anticoagulation: Coumadin for afib and St. Jude AVR; INR 2.54 (goal (3.5-4.5 per coumadin clinic note, PTA 10mg /d with admit INR 2.14). CBC stable. Scr up a little to 1.23  Infectious Disease: afeb, wbc 5.7, no abx  Cardiovascular: St Jude AVR 1983, EF 55-60%; 93/55, HR 55 on asa 81mg ,lipitor, dilt, lisionopril, tikosyn Rx (K 4.7, mag 2.3, QTc 422)  Endocrinology: n/a  Gastrointestinal / Nutrition:PPI, heart healthy diet  Neurology: n/a  Nephrology: Scr trending upward now 1.23  Pulmonary: RA  Hematology / Oncology: CBC WNL  PTA Medication Issues: no issues  Best Practices: love, coumadin, PPI   Goal of Therapy:  Anti-Xa level 0.6-1.2 units/ml 4hrs after LMWH dose given; INR 3.5-4.5 Monitor platelets by anticoagulation protocol: Yes   Plan:  Lovenox d/c'd. Recommend d/c on Coumadin 12.5mg  daily.  Mark [name]  [place] 09/12/2012,9:02 AM

## 2012-09-12 NOTE — Care Management Note (Signed)
    Page 1 of 1   09/12/2012     2:18:15 PM   CARE MANAGEMENT NOTE 09/12/2012  Patient:  Mark [name], Mark [name]   Account Number:  [id number]  Date Initiated:  09/12/2012  Documentation initiated by:  Sione Baumgarten  Subjective/Objective Assessment:   PT ADM WITH RECURRENT PAF FOR TIKOSYN RE-INITIATION.  PTA, PT INDEPENDENT, LIVES WITH SPOUSE.     Action/Plan:   MET WITH PT TO DISCUSS DC PLANS.  PT IS ALREADY ESTABLISHED WITH TIKOSYN ASSISTANCE PROGRAM, BUT IS UNINSURED AND IS ELIGIBLE FOR [facility] MATCH PROGRAM.   Anticipated DC Date:  09/12/2012   Anticipated DC Plan:  HOME/SELF CARE      DC Planning Services  CM consult  MATCH Program  Medication Assistance      Choice offered to / List presented to:             Status of service:  Completed, signed off Medicare Important Message given?   (If response is "NO", the following Medicare IM given date fields will be blank) Date Medicare IM given:   Date Additional Medicare IM given:    Discharge Disposition:  HOME/SELF CARE  Per UR Regulation:  Reviewed for med. necessity/level of care/duration of stay  If discussed at Long Length of Stay Meetings, dates discussed:    Comments:  09/12/12 Jolonda Gomm,RN,BSN [phone] PT STATES HAD BEEN ON TIKOSYN SINCE NOVEMBER, AND HAD BEEN RECEIVING ASSISTANCE WITH MED THROUGH DRUG CO. ASSISTANCE PROGRAM.  HE PLANS TO REINSTATE THIS AT DC.  PT ELIGIBLE FOR [facility] MATCH PROGRAM.  LETTER GIVEN WITH EXPLANATION OF PROGRAM BENEFITS.  PT ALSO GIVEN RX OUTREACH APPLICATION TO COMPLETE AND SEND IN FOR ASSISTANCE WITH SEVERAL OF HIS OTHER MEDS.  PT CONCERNED ABOUT HIS MOUNTING HOSPITAL BILLS.  SPOKE WITH FINANCIAL COUNSELOR AND GAVE PT PHONE # SO THEY MAY CALL HIM AND SPEAK TO HIM RE: HIS SITUATION. HE IS APPRECIATIVE OF ALL HELP GIVEN.

## 2012-09-12 NOTE — Progress Notes (Signed)
Pt/family given discharge instructions, medication lists, follow up appointments, and when to call the doctor.  Pt/family verbalizes understanding. Shaquanta Harkless McClintock    

## 2012-09-12 NOTE — Discharge Summary (Signed)
ELECTROPHYSIOLOGY DISCHARGE SUMMARY    Patient ID: Mark [name],  MRN: [id number], DOB/AGE: 1961-03-13 52 y.o.  Admit date: 09/09/2012 Discharge date: 09/12/2012  Primary Care Physician: Neena Rhymes, MD Primary Cardiologist: Hillis Range, MD  Primary Discharge Diagnosis:  1. PAF, now s/p Tikosyn loading  Secondary Discharge Diagnoses:  1. s/p AFib ablation August 2013 2. Bicuspid aortic valve s/p St. Jude AVR 3. Dyslipidemia 4. s/p cerebral aneurysm repair  Procedures This Admission:  1. Transthoracic echo done 09/10/2012 Study Conclusions - Left ventricle: The cavity size was normal. Wall thickness was increased in a pattern of mild LVH. Systolic function was normal. The estimated ejection fraction was in the range of 55% to 60%. Wall motion was normal; there were no regional wall motion abnormalities. Doppler parameters are consistent with abnormal left ventricular relaxation (grade 1 diastolic dysfunction). - Aortic valve: A St. Jude [employer] mechanical prosthesis was present. Transvalvular velocity was increased more than expected for type and size valve. Gradients are however similar to prior study from July 2013. Trivial regurgitation. Mean gradient: 23mm Hg (S). - Aorta: Ascending aortic AP diameter: 47.77mm. - Ascending aorta: The ascending aorta was moderately dilated. Aortic size and extent of aneurysmal dilation could be further assessed by CTA or MRI if clinically indicated. - Mitral valve: Trivial regurgitation. - Tricuspid valve: Trivial regurgitation. - Pericardium, extracardiac: There was no pericardial effusion.  History: Mark [name] is a 52 year old man with the above problem list. He is s/p AFib ablation 03/2012. At his follow-up visit with Dr. Johney Frame on 07/11/2012 he was taken off Tikosyn and his diltiazem dose was reduced. Unfortunately, he began to experience recurrent palpitations. He was seen in the ED on 07/20/2012 with atrial tachycardia and  converted to SR while in the ED. He was admitted in late December with recurrent atrial tachycardia and his diltiazem dose was up-titrated. He was placed on a 30 day event monitor and advised that if he had recurrent atrial arrhythmias he would likely require re-initiation of Tikosyn. Unfortunately, he has continued to have intermittent palpitations. He presented to the ED on 09/06/2012 with tachypalpitations and his ECG showed AFib. While waiting in triage, he converted to sinus rhythm and he left. Then on the day of admission, he had recurrence of palpitations and his rate was more elevated than usual which prompted him to present to the ED again. He was found to have atypical atrial flutter at 129 bpm. He converted back to SR spontaneously but elected hospital admission for Tikosyn loading.  Hospital Course:  Mark [name] was admitted and Tikosyn loading initiated per protocol at 500 micrograms every 12 hours. He tolerated this dose without any complication. His potassium and magnesium levels remained stable. His renal function remained stable. His QTc remained stable. He was continued on warfarin for mechanical AVR and AFib. Of note, he was found to have a subtherapeutic INR; therefore, he was also given Lovenox coverage until his INR >2.5. He has been seen, examined and deemed stable for discharge today by Dr. Hillis Range. He will follow-up in the Coumadin clinic on Friday, 09/16/2012. He will also have a BMET, Mg level and ECG done at that visit. He will follow-up with Dr. Johney Frame in 4 weeks. He was provided medication counseling and instructions and was told specifically to notify Dr. Johney Frame before starting any new medications, prescription or over-the-counter due to the potential drug-drug interactions.   Discharge Vitals: Blood pressure 93/55, pulse 55, temperature 98.1 F (36.7 C), temperature source Oral, resp.  rate 20, height 5\' 11"  (1.803 m), weight 237 lb 3.2 oz (107.593 kg), SpO2 96.00%.    Labs: Lab Results  Component Value Date   WBC 5.7 09/12/2012   HGB 14.9 09/12/2012   HCT 43.3 09/12/2012   MCV 88.2 09/12/2012   PLT 158 09/12/2012     Lab 09/12/12 0650 09/09/12 1750  NA 139 --  K 4.7 --  CL 102 --  CO2 27 --  BUN 20 --  CREATININE 1.23 --  CALCIUM 9.4 --  PROT -- 8.2  BILITOT -- 0.8  ALKPHOS -- 74  ALT -- 30  AST -- 27  GLUCOSE 101* --    Basename 09/12/12 0650  INR 2.54*    Disposition:  The patient is being discharged in stable condition.  Follow-up: Follow-up Information    Follow up with [facility]. On 09/16/2012. (At 7:30 AM for Coumadin follow-up (INR check); will also need BMET and Mg labs)    Contact information:   [address] [place] [address] [phone]      Follow up with Hillis Range, MD. On 10/13/2012. (At 8:45 AM)    Contact information:   [name] [place] [address] [phone]       Discharge Medications:    Medication List     As of 09/12/2012 11:42 AM    START taking these medications         dofetilide 500 MCG capsule   Commonly known as: TIKOSYN   Take 1 capsule (500 mcg total) by mouth every 12 (twelve) hours.      CHANGE how you take these medications         warfarin 10 MG tablet   Commonly known as: COUMADIN   Take 1-1/2 tablets (15 mg total) once daily for 2 days, then resume usual home dose - 1 tablet (10 mg total) once daily   What changed: - dose - route (how to take the med) - how often to take the med - doctor's instructions      CONTINUE taking these medications         aspirin EC 81 MG tablet      diltiazem 180 MG 24 hr capsule   Commonly known as: TIAZAC   Take 1 capsule (180 mg total) by mouth 2 (two) times daily.      lisinopril 20 MG tablet   Commonly known as: PRINIVIL,ZESTRIL      pantoprazole 40 MG tablet   Commonly known as: PROTONIX      simvastatin 40 MG tablet   Commonly known as: ZOCOR   Take 1 tablet  (40 mg total) by mouth at bedtime.       Duration of Discharge Encounter: Greater than 30 minutes including physician time.  Limmie Patricia, PA-C 09/12/2012, 11:42 AM   Hillis Range, MD

## 2012-09-13 ENCOUNTER — Ambulatory Visit (INDEPENDENT_AMBULATORY_CARE_PROVIDER_SITE_OTHER): Payer: No Typology Code available for payment source | Admitting: Family Medicine

## 2012-09-13 ENCOUNTER — Encounter: Payer: Self-pay | Admitting: Family Medicine

## 2012-09-13 VITALS — BP 108/80 | HR 63 | Temp 98.2°F | Ht 70.75 in | Wt 237.4 lb

## 2012-09-13 DIAGNOSIS — R454 Irritability and anger: Secondary | ICD-10-CM | POA: Insufficient documentation

## 2012-09-13 DIAGNOSIS — H8309 Labyrinthitis, unspecified ear: Secondary | ICD-10-CM | POA: Insufficient documentation

## 2012-09-13 MED ORDER — ONDANSETRON HCL 8 MG PO TABS: 8.0000 mg | ORAL_TABLET | Freq: Three times a day (TID) | ORAL | Status: DC | PRN

## 2012-09-13 MED ORDER — MECLIZINE HCL 50 MG PO TABS: 50.0000 mg | ORAL_TABLET | Freq: Three times a day (TID) | ORAL | Status: DC | PRN

## 2012-09-13 NOTE — Patient Instructions (Addendum)
This is a labyrinthitis or inner ear issue The Meclizine will stop the spins but may make you drowsy The Zofran for the nausea REST! Drink plenty of fluids We'll call you with your ENT appt Call with any questions or concerns Hang in there!!!

## 2012-09-13 NOTE — Assessment & Plan Note (Signed)
New.  Pt feels this might be med related.  Discussed that this could be a manifestation of depression which is common in cardiac pts.  Pt to discuss possibility of med change w/ cards but if cards doesn't feel this is due to meds, pt to schedule f/u appt w/ me so we can start appropriate medication to control mood.  Pt expressed understanding and is in agreement w/ plan.

## 2012-09-13 NOTE — Assessment & Plan Note (Signed)
New.  ? Meniere's given decreased hearing, tinnitus, and vertigo.  Start Meclizine.  Zofran for nausea.  Refer to ENT.  Reviewed supportive care and red flags that should prompt return.  Pt expressed understanding and is in agreement w/ plan.

## 2012-09-13 NOTE — Progress Notes (Signed)
  Subjective:    Patient ID: Mark [name], male    DOB: [birth year], 52 y.o.   MRN: [id number]  HPI Ear pain- L ear, hx of 2-3 ear infxns, 'always on the L'.  sxs started last night.  No fevers.  Pt reports he is having ringing in the ear, 'sounds like a seashell', hearing is decreased, dizziness.  + nausea.  + vertigo.  Just initiated tikosyn over the weekend- has been on this previously w/out vertigo.  No nasal congestion, no sinus pain/pressure, no cough.  Anger- pt feels that one if his meds is making him quick to anger.  Reports multiple people are telling him he's an 'asshole'.  Snapped at wife in front of the CMA in office today.  Wife in tears.  Pt states he's not aware he's doing it but 'enough people have told me that I should probably say something'.  Pt feels it's his Dilt.  Wife unable to give time line of when this started, 'a while'.   Review of Systems For ROS see HPI     Objective:   Physical Exam  Vitals reviewed. Constitutional: He is oriented to person, place, and time. He appears well-developed and well-nourished. No distress.  HENT:  Head: Normocephalic and atraumatic.  Nose: Nose normal.  Mouth/Throat: Oropharynx is clear and moist. No oropharyngeal exudate.       No TTP over sinuses R TM normal w/ clear fluid visible L TM normal w/ serous fluid present  Eyes: Conjunctivae normal and EOM are normal. Pupils are equal, round, and reactive to light.  Neck: Normal range of motion. Neck supple.  Cardiovascular: Normal rate, regular rhythm and intact distal pulses.        III/VI SEM  Pulmonary/Chest: Effort normal and breath sounds normal. No respiratory distress. He has no wheezes. He has no rales.  Neurological: He is alert and oriented to person, place, and time.  Skin: Skin is warm and dry.  Psychiatric: He has a normal mood and affect. His behavior is normal. Thought content normal.          Assessment & Plan:

## 2012-09-15 ENCOUNTER — Ambulatory Visit (INDEPENDENT_AMBULATORY_CARE_PROVIDER_SITE_OTHER): Payer: No Typology Code available for payment source

## 2012-09-15 DIAGNOSIS — Z7901 Long term (current) use of anticoagulants: Secondary | ICD-10-CM

## 2012-09-15 DIAGNOSIS — I4891 Unspecified atrial fibrillation: Secondary | ICD-10-CM

## 2012-09-15 DIAGNOSIS — Z954 Presence of other heart-valve replacement: Secondary | ICD-10-CM

## 2012-09-15 DIAGNOSIS — Z952 Presence of prosthetic heart valve: Secondary | ICD-10-CM

## 2012-09-15 LAB — BASIC METABOLIC PANEL
BUN: 24 mg/dL — ABNORMAL HIGH (ref 6–23)
CO2: 24 mEq/L (ref 19–32)
Calcium: 9.1 mg/dL (ref 8.4–10.5)
Chloride: 106 mEq/L (ref 96–112)
Creatinine, Ser: 1.2 mg/dL (ref 0.4–1.5)
GFR: 71.05 mL/min (ref >60.00)
Glucose, Bld: 104 mg/dL — ABNORMAL HIGH (ref 70–99)
Potassium: 4.3 mEq/L (ref 3.5–5.1)
Sodium: 137 mEq/L (ref 135–145)

## 2012-09-15 LAB — MAGNESIUM: Magnesium: 2 mg/dL (ref 1.5–2.5)

## 2012-09-15 LAB — POCT INR: INR: 4.1

## 2012-09-15 MED ORDER — WARFARIN SODIUM 10 MG PO TABS: ORAL_TABLET | ORAL | Status: DC

## 2012-09-19 ENCOUNTER — Telehealth: Payer: Self-pay | Admitting: Pharmacist

## 2012-09-19 ENCOUNTER — Telehealth (HOSPITAL_COMMUNITY): Payer: Self-pay | Admitting: *Deleted

## 2012-09-19 NOTE — Telephone Encounter (Signed)
Pt's Tikosyn arrived from company pt is aware and will p/u med on Wed

## 2012-09-19 NOTE — Telephone Encounter (Signed)
Post - d/c Tykosin labs Mag, K+ and Cr are all WNL. Leota Sauers Pharm.D. CPP, BCPS Clinical Pharmacist [phone] 09/19/2012 4:41 PM

## 2012-09-30 ENCOUNTER — Ambulatory Visit (INDEPENDENT_AMBULATORY_CARE_PROVIDER_SITE_OTHER): Payer: No Typology Code available for payment source

## 2012-09-30 DIAGNOSIS — Z954 Presence of other heart-valve replacement: Secondary | ICD-10-CM

## 2012-09-30 DIAGNOSIS — Z7901 Long term (current) use of anticoagulants: Secondary | ICD-10-CM

## 2012-09-30 DIAGNOSIS — Z952 Presence of prosthetic heart valve: Secondary | ICD-10-CM

## 2012-09-30 DIAGNOSIS — I4891 Unspecified atrial fibrillation: Secondary | ICD-10-CM

## 2012-09-30 LAB — POCT INR: INR: 5.7

## 2012-10-13 ENCOUNTER — Ambulatory Visit: Payer: Self-pay | Admitting: Internal Medicine

## 2012-10-21 ENCOUNTER — Ambulatory Visit (INDEPENDENT_AMBULATORY_CARE_PROVIDER_SITE_OTHER): Payer: No Typology Code available for payment source

## 2012-10-21 DIAGNOSIS — Z7901 Long term (current) use of anticoagulants: Secondary | ICD-10-CM

## 2012-10-21 DIAGNOSIS — I4891 Unspecified atrial fibrillation: Secondary | ICD-10-CM

## 2012-10-21 DIAGNOSIS — Z954 Presence of other heart-valve replacement: Secondary | ICD-10-CM

## 2012-10-21 LAB — POCT INR: INR: 3.9

## 2012-10-26 ENCOUNTER — Observation Stay (HOSPITAL_COMMUNITY)
Admission: EM | Admit: 2012-10-26 | Discharge: 2012-10-27 | Disposition: A | Discharge status: Home / Self Care | Payer: No Typology Code available for payment source | Attending: Internal Medicine | Admitting: Internal Medicine

## 2012-10-26 ENCOUNTER — Encounter (HOSPITAL_COMMUNITY): Payer: Self-pay | Admitting: *Deleted

## 2012-10-26 ENCOUNTER — Emergency Department (HOSPITAL_COMMUNITY): Payer: Self-pay

## 2012-10-26 DIAGNOSIS — I10 Essential (primary) hypertension: Secondary | ICD-10-CM | POA: Insufficient documentation

## 2012-10-26 DIAGNOSIS — I4891 Unspecified atrial fibrillation: Principal | ICD-10-CM | POA: Insufficient documentation

## 2012-10-26 DIAGNOSIS — R079 Chest pain, unspecified: Secondary | ICD-10-CM

## 2012-10-26 DIAGNOSIS — I480 Paroxysmal atrial fibrillation: Secondary | ICD-10-CM

## 2012-10-26 DIAGNOSIS — Z7901 Long term (current) use of anticoagulants: Secondary | ICD-10-CM | POA: Insufficient documentation

## 2012-10-26 DIAGNOSIS — Z952 Presence of prosthetic heart valve: Secondary | ICD-10-CM

## 2012-10-26 DIAGNOSIS — Z79899 Other long term (current) drug therapy: Secondary | ICD-10-CM | POA: Insufficient documentation

## 2012-10-26 DIAGNOSIS — E785 Hyperlipidemia, unspecified: Secondary | ICD-10-CM | POA: Diagnosis present

## 2012-10-26 DIAGNOSIS — Z954 Presence of other heart-valve replacement: Secondary | ICD-10-CM | POA: Insufficient documentation

## 2012-10-26 DIAGNOSIS — G4733 Obstructive sleep apnea (adult) (pediatric): Secondary | ICD-10-CM | POA: Insufficient documentation

## 2012-10-26 DIAGNOSIS — K219 Gastro-esophageal reflux disease without esophagitis: Secondary | ICD-10-CM | POA: Insufficient documentation

## 2012-10-26 LAB — BASIC METABOLIC PANEL
BUN: 18 mg/dL (ref 6–23)
CO2: 25 mEq/L (ref 19–32)
Calcium: 9.7 mg/dL (ref 8.4–10.5)
Chloride: 104 mEq/L (ref 96–112)
Creatinine, Ser: 1.17 mg/dL (ref 0.50–1.35)
GFR calc Af Amer: 83 mL/min — ABNORMAL LOW (ref >90)
GFR calc non Af Amer: 72 mL/min — ABNORMAL LOW (ref >90)
Glucose, Bld: 116 mg/dL — ABNORMAL HIGH (ref 70–99)
Potassium: 4.1 mEq/L (ref 3.5–5.1)
Sodium: 140 mEq/L (ref 135–145)

## 2012-10-26 LAB — CBC
HCT: 39.9 % (ref 39.0–52.0)
Hemoglobin: 14.7 g/dL (ref 13.0–17.0)
MCH: 31.9 pg (ref 26.0–34.0)
MCHC: 36.8 g/dL — ABNORMAL HIGH (ref 30.0–36.0)
MCV: 86.6 fL (ref 78.0–100.0)
Platelets: 174 10*3/uL (ref 150–400)
RBC: 4.61 MIL/uL (ref 4.22–5.81)
RDW: 12.6 % (ref 11.5–15.5)
WBC: 8.8 10*3/uL (ref 4.0–10.5)

## 2012-10-26 LAB — PROTIME-INR
INR: 2.58 — ABNORMAL HIGH (ref 0.00–1.49)
Prothrombin Time: 26.4 seconds — ABNORMAL HIGH (ref 11.6–15.2)

## 2012-10-26 LAB — POCT I-STAT TROPONIN I: Troponin i, poc: 0 ng/mL (ref 0.00–0.08)

## 2012-10-26 LAB — APTT: aPTT: 37 seconds (ref 24–37)
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
## 2012-10-26 NOTE — ED Notes (Signed)
Pt states hx: of Afib, and today after eating Mindi Slicker he felt his heart racing developed central Chest pressure, Nausea. Denies vomiting, and diaphoresis. Pain relieved by nothing, and exacerbated by nothing.

## 2012-10-27 ENCOUNTER — Encounter (HOSPITAL_COMMUNITY): Payer: Self-pay | Admitting: Pharmacy Technician

## 2012-10-27 ENCOUNTER — Encounter (HOSPITAL_COMMUNITY): Payer: Self-pay | Admitting: General Practice

## 2012-10-27 ENCOUNTER — Telehealth: Payer: Self-pay | Admitting: *Deleted

## 2012-10-27 DIAGNOSIS — R079 Chest pain, unspecified: Secondary | ICD-10-CM

## 2012-10-27 DIAGNOSIS — I4891 Unspecified atrial fibrillation: Principal | ICD-10-CM

## 2012-10-27 LAB — BASIC METABOLIC PANEL
BUN: 16 mg/dL (ref 6–23)
CO2: 24 mEq/L (ref 19–32)
Calcium: 9.2 mg/dL (ref 8.4–10.5)
Chloride: 110 mEq/L (ref 96–112)
Creatinine, Ser: 1.01 mg/dL (ref 0.50–1.35)
GFR calc Af Amer: >90 mL/min (ref >90)
GFR calc non Af Amer: 86 mL/min — ABNORMAL LOW (ref >90)
Glucose, Bld: 102 mg/dL — ABNORMAL HIGH (ref 70–99)
Potassium: 4.3 mEq/L (ref 3.5–5.1)
Sodium: 145 mEq/L (ref 135–145)

## 2012-10-27 LAB — CBC
HCT: 37.9 % — ABNORMAL LOW (ref 39.0–52.0)
Hemoglobin: 13.4 g/dL (ref 13.0–17.0)
MCH: 30.5 pg (ref 26.0–34.0)
MCHC: 35.4 g/dL (ref 30.0–36.0)
MCV: 86.1 fL (ref 78.0–100.0)
Platelets: 155 10*3/uL (ref 150–400)
RBC: 4.4 MIL/uL (ref 4.22–5.81)
RDW: 12.6 % (ref 11.5–15.5)
WBC: 6.2 10*3/uL (ref 4.0–10.5)

## 2012-10-27 LAB — TROPONIN I
Troponin I: <0.3 ng/mL (ref <0.30)
Troponin I: <0.3 ng/mL (ref <0.30)

## 2012-10-27 LAB — PROTIME-INR
INR: 2.5 — ABNORMAL HIGH (ref 0.00–1.49)
Prothrombin Time: 25.8 seconds — ABNORMAL HIGH (ref 11.6–15.2)

## 2012-10-27 LAB — MRSA PCR SCREENING: MRSA by PCR: POSITIVE — AB

## 2012-10-27 LAB — LIPID PANEL
Cholesterol: 157 mg/dL (ref 0–200)
HDL: 41 mg/dL (ref >39)
LDL Cholesterol: 99 mg/dL (ref 0–99)
Total CHOL/HDL Ratio: 3.8 RATIO
Triglycerides: 84 mg/dL (ref <150)
VLDL: 17 mg/dL (ref 0–40)

## 2012-10-27 MED ORDER — ACETAMINOPHEN 325 MG PO TABS: 650.0000 mg | ORAL_TABLET | ORAL | Status: DC | PRN

## 2012-10-27 MED ORDER — DOFETILIDE 500 MCG PO CAPS: 500.0000 ug | ORAL_CAPSULE | Freq: Two times a day (BID) | ORAL | Status: DC

## 2012-10-27 MED ORDER — ASPIRIN EC 81 MG PO TBEC
81.0000 mg | DELAYED_RELEASE_TABLET | Freq: Every day | ORAL | Status: DC
  Administered 2012-10-27: 81 mg via ORAL

## 2012-10-27 MED ORDER — WARFARIN SODIUM 7.5 MG PO TABS
15.0000 mg | ORAL_TABLET | Freq: Every day | ORAL | Status: DC
  Administered 2012-10-27: 15 mg via ORAL
  Filled 2012-10-27: qty 2

## 2012-10-27 MED ORDER — ONDANSETRON HCL 4 MG/2ML IJ SOLN: 4.0000 mg | Freq: Four times a day (QID) | INTRAMUSCULAR | Status: DC | PRN

## 2012-10-27 MED ORDER — DILTIAZEM HCL ER BEADS 180 MG PO CP24: 180.0000 mg | ORAL_CAPSULE | Freq: Two times a day (BID) | ORAL | Status: DC

## 2012-10-27 MED ORDER — SIMVASTATIN 40 MG PO TABS
40.0000 mg | ORAL_TABLET | Freq: Every day | ORAL | Status: DC
  Filled 2012-10-27: qty 1

## 2012-10-27 MED ORDER — CHLORHEXIDINE GLUCONATE CLOTH 2 % EX PADS
6.0000 | MEDICATED_PAD | Freq: Every day | CUTANEOUS | Status: DC
  Administered 2012-10-27: 6 via TOPICAL

## 2012-10-27 MED ORDER — NITROGLYCERIN 0.4 MG SL SUBL: 0.4000 mg | SUBLINGUAL_TABLET | SUBLINGUAL | Status: DC | PRN

## 2012-10-27 MED ORDER — DILTIAZEM HCL ER BEADS 240 MG PO CP24: 240.0000 mg | ORAL_CAPSULE | Freq: Two times a day (BID) | ORAL | Status: DC

## 2012-10-27 MED ORDER — DOFETILIDE 500 MCG PO CAPS
500.0000 ug | ORAL_CAPSULE | Freq: Two times a day (BID) | ORAL | Status: DC
  Administered 2012-10-27: 500 ug via ORAL
  Filled 2012-10-27 (×3): qty 1

## 2012-10-27 MED ORDER — PANTOPRAZOLE SODIUM 40 MG PO TBEC
40.0000 mg | DELAYED_RELEASE_TABLET | Freq: Two times a day (BID) | ORAL | Status: DC
  Administered 2012-10-27: 40 mg via ORAL
  Filled 2012-10-27: qty 1

## 2012-10-27 MED ORDER — LISINOPRIL 20 MG PO TABS
20.0000 mg | ORAL_TABLET | Freq: Every day | ORAL | Status: DC
  Filled 2012-10-27: qty 1

## 2012-10-27 MED ORDER — MUPIROCIN 2 % EX OINT
1.0000 "application " | TOPICAL_OINTMENT | Freq: Two times a day (BID) | CUTANEOUS | Status: DC
  Administered 2012-10-27: 1 via NASAL
  Filled 2012-10-27: qty 22

## 2012-10-27 MED ORDER — ASPIRIN EC 81 MG PO TBEC
81.0000 mg | DELAYED_RELEASE_TABLET | Freq: Every day | ORAL | Status: DC
  Filled 2012-10-27: qty 1

## 2012-10-27 MED ORDER — DILTIAZEM HCL ER COATED BEADS 180 MG PO CP24
180.0000 mg | ORAL_CAPSULE | Freq: Two times a day (BID) | ORAL | Status: DC
  Administered 2012-10-27: 180 mg via ORAL
  Filled 2012-10-27 (×2): qty 1

## 2012-10-27 MED ORDER — WARFARIN - PHYSICIAN DOSING INPATIENT: Freq: Every day | Status: DC

## 2012-10-27 NOTE — Discharge Summary (Signed)
Patient ID: Mark [name],  MRN: [id number], DOB/AGE: 1961/06/17 52 y.o.  Admit date: 10/26/2012 Discharge date: 10/27/2012  Primary Care Provider: Neena Rhymes Primary Cardiologist: D. Bensimhon, MD/  J. Simmie Camerer, MD  Discharge Diagnoses Principal Problem:   Atrial fibrillation  **Recurrent despite ongoing tikosyn therapy.    **Repeat TEE/PVI scheduled for 11/08/2012 Active Problems:   Aortic valve replaced  **SJM, 1983, chronic coumadin with INR goal 3.5-4.5.   Chest pain  **Neg CE this admission w/ plan for outpt Cardiac CT.   Hyperlipidemia  Allergies Allergies  Allergen Reactions  . Morphine And Related Nausea And Vomiting  . Percocet (Oxycodone-Acetaminophen) Nausea And Vomiting    Procedures  None  History of Present Illness  52 y/o male with the above problem list.  He is s/p afib ablation in 03/2012 and subsequently came off of diltiazem and tikosyn in 07/2012.  Unfortunately, following this medication change, he began o experience recurrent palpitations and had multiple ED visits.  In January 2014, he was admitted and re-placed on tikosyn therapy.  Unfortunately, this did little to change his frequency of atrial fibrillation, and it continued to occur almost daily.  On the day of admission, pt had recurrent tachypalpitations consistent with afib and associated with chest pain.  He presented to the [facility] ED, where he eventually converted to sinus rhythm.  He was admitted for further evaluation.  Hospital Course  Pt r/o for MI.  He had no further afib.  He was seen by Dr. Johney Frame and it was felt that pt would benefit most from touch-up ablation.  This, along with a TEE, has been arranged for March 11.  We plan to check weekly INR's up to that point.  Though his INR goal is 3.5-4.5, given h/o remote AVR in 1983, we would like to see his INR around 3.0 for the day of his procedure.  His INR has been subtherapeutic while hospitalized, though he was recently therapeutic  on the same dose of coumadin as an outpatient.  He will be discharged today without bridging.  As patient had chest pain associated with his afib, we will arrange for a cardiac CTA to evaluate his coronary anatomy, to be performed as an outpatient.  Discharge Vitals Blood pressure 117/78, pulse 60, temperature 98 F (36.7 C), temperature source Oral, resp. rate 16, height 5\' 11"  (1.803 m), weight 240 lb 1.6 oz (108.909 kg), SpO2 96.00%.  Filed Weights   10/27/12 0207  Weight: 240 lb 1.6 oz (108.909 kg)    Labs  CBC  Recent Labs  10/26/12 2133 10/27/12 0247  WBC 8.8 6.2  HGB 14.7 13.4  HCT 39.9 37.9*  MCV 86.6 86.1  PLT 174 155   Basic Metabolic Panel  Recent Labs  10/26/12 2133 10/27/12 0247  NA 140 145  K 4.1 4.3  CL 104 110  CO2 25 24  GLUCOSE 116* 102*  BUN 18 16  CREATININE 1.17 1.01  CALCIUM 9.7 9.2   Cardiac Enzymes  Recent Labs  10/27/12 0247 10/27/12 0813  TROPONINI <0.30 <0.30   Fasting Lipid Panel  Recent Labs  10/27/12 0247  CHOL 157  HDL 41  LDLCALC 99  TRIG 84  CHOLHDL 3.8   Disposition  Pt is being discharged home today in good condition.  Follow-up Plans & Appointments  Follow-up Information   Follow up with [facility] On 11/08/2012. (arrive @ [facility] @ 9 AM for 10 AM TEE to be followed by Ablation)  Follow up with [facility]. (we will contact you to arrange cardiac CT)    Contact information:   [address] [place] [phone]      Follow up with [facility] On 10/31/2012. (8:30 AM)    Contact information:   [address] [place] [phone]      Follow up with [facility] On 11/07/2012. (10:00 AM)    Contact information:   [address] [place] [phone]     Discharge Medications    Medication List    TAKE these medications       aspirin EC 81 MG tablet  Take 81 mg by mouth daily.     diltiazem 240 MG 24 hr  capsule  Commonly known as:  TIAZAC  Take 1 capsule (240 mg total) by mouth 2 (two) times daily.     dofetilide 500 MCG capsule  Commonly known as:  TIKOSYN  Take 1 capsule (500 mcg total) by mouth every 12 (twelve) hours.     lisinopril 20 MG tablet  Commonly known as:  PRINIVIL,ZESTRIL  Take 1 tablet (20 mg total) by mouth at bedtime.     pantoprazole 40 MG tablet  Commonly known as:  PROTONIX  Take 1 tablet (40 mg total) by mouth at bedtime.     simvastatin 40 MG tablet  Commonly known as:  ZOCOR  Take 40 mg by mouth at bedtime.     warfarin 10 MG tablet  Commonly known as:  COUMADIN  Take 10 mg by mouth daily.       Outstanding Labs/Studies  F/U weekly INR's.  Duration of Discharge Encounter   Greater than 30 minutes including physician time.  Signed, Nicolasa Ducking NP 10/27/2012, 12:19 PM    Hillis Range, MD
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
## 2012-10-27 NOTE — Progress Notes (Signed)
Pt provided with dc instructions and education. Pt verbalized understanding. Pt educated on increase in diltiazem. Pt teachback learning. Pt denise CP/SOB. VSS. IV removed with tip intact. Heart monitor cleaned and returned to front. Pt elaving for home with family. Levonne Spiller, RN
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
## 2012-10-27 NOTE — Progress Notes (Signed)
   ELECTROPHYSIOLOGY ROUNDING NOTE    Patient Name: Mark [name] Date of Encounter: 10-27-2012  Patient scheduled for EPS and RFCA of atrial fibrillation on Tuesday, November 08, 2012. TEE scheduled 11-08-2012 at 10AM with Dr Gala Romney.  Pt should arrive at short stay at 9AM.  NPO after midnight.  Weekly Coumadin clinic visits scheduled.  Office will call patient to schedule cardiac CT scan prior to appointment.   Signed, Gypsy Balsam RN, BSN  Hillis Range, MD

## 2012-10-27 NOTE — Progress Notes (Signed)
Utilization review completed.  

## 2012-10-27 NOTE — Progress Notes (Signed)
MD on call notified of pt's HR dropping to 49 non-sustaining. Pt asymptomatic. No new orders given at this time. Will continue to monitor the pt. Sanda Linger

## 2012-10-27 NOTE — Telephone Encounter (Signed)
Please call patient and schedule Cardiac CT scan for next week.  He is pending repeat afib ablation on 11-08-2012.

## 2012-10-27 NOTE — Plan of Care (Signed)
Problem: Phase I Progression Outcomes Goal: Anginal pain relieved Outcome: Progressing Pt has been asymptomatic since arriving to the floor

## 2012-10-28 ENCOUNTER — Other Ambulatory Visit: Payer: Self-pay | Admitting: *Deleted

## 2012-10-28 DIAGNOSIS — I4891 Unspecified atrial fibrillation: Secondary | ICD-10-CM

## 2012-10-31 ENCOUNTER — Ambulatory Visit (INDEPENDENT_AMBULATORY_CARE_PROVIDER_SITE_OTHER): Payer: No Typology Code available for payment source | Admitting: *Deleted

## 2012-10-31 ENCOUNTER — Other Ambulatory Visit (INDEPENDENT_AMBULATORY_CARE_PROVIDER_SITE_OTHER): Payer: No Typology Code available for payment source

## 2012-10-31 ENCOUNTER — Encounter: Payer: Self-pay | Admitting: Internal Medicine

## 2012-10-31 DIAGNOSIS — Z952 Presence of prosthetic heart valve: Secondary | ICD-10-CM

## 2012-10-31 DIAGNOSIS — Z954 Presence of other heart-valve replacement: Secondary | ICD-10-CM

## 2012-10-31 DIAGNOSIS — I4891 Unspecified atrial fibrillation: Secondary | ICD-10-CM

## 2012-10-31 DIAGNOSIS — Z7901 Long term (current) use of anticoagulants: Secondary | ICD-10-CM

## 2012-10-31 LAB — BASIC METABOLIC PANEL
BUN: 15 mg/dL (ref 6–23)
CO2: 23 mEq/L (ref 19–32)
Calcium: 9.2 mg/dL (ref 8.4–10.5)
Chloride: 107 mEq/L (ref 96–112)
Creatinine, Ser: 1.2 mg/dL (ref 0.4–1.5)
GFR: 71.73 mL/min (ref >60.00)
Glucose, Bld: 106 mg/dL — ABNORMAL HIGH (ref 70–99)
Potassium: 4 mEq/L (ref 3.5–5.1)
Sodium: 139 mEq/L (ref 135–145)

## 2012-10-31 LAB — CBC WITH DIFFERENTIAL/PLATELET
Basophils Absolute: 0 10*3/uL (ref 0.0–0.1)
Basophils Relative: 0.5 % (ref 0.0–3.0)
Eosinophils Absolute: 0.1 10*3/uL (ref 0.0–0.7)
Eosinophils Relative: 1.4 % (ref 0.0–5.0)
HCT: 41.7 % (ref 39.0–52.0)
Hemoglobin: 14.1 g/dL (ref 13.0–17.0)
Lymphocytes Relative: 29.8 % (ref 12.0–46.0)
Lymphs Abs: 1.5 10*3/uL (ref 0.7–4.0)
MCHC: 33.7 g/dL (ref 30.0–36.0)
MCV: 89.7 fl (ref 78.0–100.0)
Monocytes Absolute: 0.4 10*3/uL (ref 0.1–1.0)
Monocytes Relative: 7.6 % (ref 3.0–12.0)
Neutro Abs: 3.1 10*3/uL (ref 1.4–7.7)
Neutrophils Relative %: 60.7 % (ref 43.0–77.0)
Platelets: 147 10*3/uL — ABNORMAL LOW (ref 150.0–400.0)
RBC: 4.65 Mil/uL (ref 4.22–5.81)
RDW: 13.2 % (ref 11.5–14.6)
WBC: 5.1 10*3/uL (ref 4.5–10.5)

## 2012-10-31 LAB — PROTIME-INR
INR: 5.2 ratio — ABNORMAL HIGH (ref 0.8–1.0)
Prothrombin Time: 53.3 s (ref 10.2–12.4)

## 2012-10-31 LAB — POCT INR: INR: 5.2

## 2012-11-02 ENCOUNTER — Ambulatory Visit (HOSPITAL_COMMUNITY)
Admission: RE | Admit: 2012-11-02 | Discharge: 2012-11-02 | Disposition: A | Discharge status: Home / Self Care | Payer: No Typology Code available for payment source | Source: Ambulatory Visit | Attending: Internal Medicine | Admitting: Internal Medicine

## 2012-11-02 DIAGNOSIS — I712 Thoracic aortic aneurysm, without rupture, unspecified: Secondary | ICD-10-CM | POA: Insufficient documentation

## 2012-11-02 DIAGNOSIS — I4891 Unspecified atrial fibrillation: Secondary | ICD-10-CM | POA: Insufficient documentation

## 2012-11-02 MED ORDER — METOPROLOL TARTRATE 1 MG/ML IV SOLN
INTRAVENOUS | Status: AC
  Filled 2012-11-02: qty 10

## 2012-11-02 MED ORDER — NITROGLYCERIN 0.4 MG SL SUBL
SUBLINGUAL_TABLET | SUBLINGUAL | Status: AC
  Filled 2012-11-02: qty 25

## 2012-11-02 MED ORDER — METOPROLOL TARTRATE 1 MG/ML IV SOLN
5.0000 mg | INTRAVENOUS | Status: DC | PRN
  Administered 2012-11-02: 5 mg via INTRAVENOUS

## 2012-11-02 MED ORDER — IOHEXOL 350 MG/ML SOLN
100.0000 mL | Freq: Once | INTRAVENOUS | Status: AC | PRN
  Administered 2012-11-02: 80 mL via INTRAVENOUS

## 2012-11-02 MED ORDER — NITROGLYCERIN 0.4 MG SL SUBL
0.4000 mg | SUBLINGUAL_TABLET | SUBLINGUAL | Status: DC | PRN
  Administered 2012-11-02: 0.4 mg via SUBLINGUAL

## 2012-11-02 NOTE — Progress Notes (Signed)
Discharged home amb denies questions.

## 2012-11-03 DIAGNOSIS — I719 Aortic aneurysm of unspecified site, without rupture: Secondary | ICD-10-CM

## 2012-11-04 ENCOUNTER — Inpatient Hospital Stay (HOSPITAL_COMMUNITY): Admission: RE | Admit: 2012-11-04 | Payer: No Typology Code available for payment source | Source: Ambulatory Visit

## 2012-11-07 ENCOUNTER — Ambulatory Visit (INDEPENDENT_AMBULATORY_CARE_PROVIDER_SITE_OTHER): Payer: No Typology Code available for payment source | Admitting: *Deleted

## 2012-11-07 ENCOUNTER — Ambulatory Visit: Payer: Self-pay | Admitting: Internal Medicine

## 2012-11-07 DIAGNOSIS — Z954 Presence of other heart-valve replacement: Secondary | ICD-10-CM

## 2012-11-07 DIAGNOSIS — Z952 Presence of prosthetic heart valve: Secondary | ICD-10-CM

## 2012-11-07 DIAGNOSIS — I4891 Unspecified atrial fibrillation: Secondary | ICD-10-CM

## 2012-11-07 DIAGNOSIS — Z7901 Long term (current) use of anticoagulants: Secondary | ICD-10-CM

## 2012-11-07 LAB — POCT INR: INR: 2.9

## 2012-11-08 ENCOUNTER — Ambulatory Visit (HOSPITAL_COMMUNITY): Payer: No Typology Code available for payment source | Admitting: Anesthesiology

## 2012-11-08 ENCOUNTER — Ambulatory Visit (HOSPITAL_COMMUNITY)
Admission: RE | Admit: 2012-11-08 | Discharge: 2012-11-09 | Disposition: A | Discharge status: Home / Self Care | Payer: No Typology Code available for payment source | Source: Ambulatory Visit | Attending: Internal Medicine | Admitting: Internal Medicine

## 2012-11-08 ENCOUNTER — Encounter (HOSPITAL_COMMUNITY)
Admission: RE | Disposition: A | Discharge status: Home / Self Care | Payer: Self-pay | Source: Ambulatory Visit | Attending: Internal Medicine

## 2012-11-08 ENCOUNTER — Encounter (HOSPITAL_COMMUNITY): Payer: Self-pay | Admitting: Anesthesiology

## 2012-11-08 ENCOUNTER — Encounter (HOSPITAL_COMMUNITY): Payer: Self-pay | Admitting: Gastroenterology

## 2012-11-08 ENCOUNTER — Ambulatory Visit (INDEPENDENT_AMBULATORY_CARE_PROVIDER_SITE_OTHER): Payer: No Typology Code available for payment source | Admitting: Pharmacist

## 2012-11-08 DIAGNOSIS — Z7901 Long term (current) use of anticoagulants: Secondary | ICD-10-CM

## 2012-11-08 DIAGNOSIS — I471 Supraventricular tachycardia, unspecified: Secondary | ICD-10-CM | POA: Insufficient documentation

## 2012-11-08 DIAGNOSIS — I712 Thoracic aortic aneurysm, without rupture: Secondary | ICD-10-CM

## 2012-11-08 DIAGNOSIS — G4733 Obstructive sleep apnea (adult) (pediatric): Secondary | ICD-10-CM

## 2012-11-08 DIAGNOSIS — Z952 Presence of prosthetic heart valve: Secondary | ICD-10-CM

## 2012-11-08 DIAGNOSIS — R454 Irritability and anger: Secondary | ICD-10-CM

## 2012-11-08 DIAGNOSIS — I350 Nonrheumatic aortic (valve) stenosis: Secondary | ICD-10-CM

## 2012-11-08 DIAGNOSIS — N529 Male erectile dysfunction, unspecified: Secondary | ICD-10-CM

## 2012-11-08 DIAGNOSIS — I10 Essential (primary) hypertension: Secondary | ICD-10-CM | POA: Insufficient documentation

## 2012-11-08 DIAGNOSIS — R002 Palpitations: Secondary | ICD-10-CM

## 2012-11-08 DIAGNOSIS — I4891 Unspecified atrial fibrillation: Secondary | ICD-10-CM

## 2012-11-08 DIAGNOSIS — Q211 Atrial septal defect: Secondary | ICD-10-CM | POA: Insufficient documentation

## 2012-11-08 DIAGNOSIS — S93409A Sprain of unspecified ligament of unspecified ankle, initial encounter: Secondary | ICD-10-CM

## 2012-11-08 DIAGNOSIS — R5383 Other fatigue: Secondary | ICD-10-CM

## 2012-11-08 DIAGNOSIS — Q2111 Secundum atrial septal defect: Secondary | ICD-10-CM | POA: Insufficient documentation

## 2012-11-08 DIAGNOSIS — L089 Local infection of the skin and subcutaneous tissue, unspecified: Secondary | ICD-10-CM

## 2012-11-08 DIAGNOSIS — Z954 Presence of other heart-valve replacement: Secondary | ICD-10-CM

## 2012-11-08 DIAGNOSIS — N10 Acute tubulo-interstitial nephritis: Secondary | ICD-10-CM

## 2012-11-08 DIAGNOSIS — I7121 Aneurysm of the ascending aorta, without rupture: Secondary | ICD-10-CM

## 2012-11-08 DIAGNOSIS — M79609 Pain in unspecified limb: Secondary | ICD-10-CM

## 2012-11-08 DIAGNOSIS — I330 Acute and subacute infective endocarditis: Secondary | ICD-10-CM

## 2012-11-08 DIAGNOSIS — J302 Other seasonal allergic rhinitis: Secondary | ICD-10-CM

## 2012-11-08 DIAGNOSIS — R509 Fever, unspecified: Secondary | ICD-10-CM

## 2012-11-08 DIAGNOSIS — I719 Aortic aneurysm of unspecified site, without rupture: Secondary | ICD-10-CM

## 2012-11-08 DIAGNOSIS — R51 Headache: Secondary | ICD-10-CM

## 2012-11-08 DIAGNOSIS — R079 Chest pain, unspecified: Secondary | ICD-10-CM

## 2012-11-08 DIAGNOSIS — E785 Hyperlipidemia, unspecified: Secondary | ICD-10-CM

## 2012-11-08 HISTORY — PX: TEE WITHOUT CARDIOVERSION: SHX5443

## 2012-11-08 HISTORY — PX: ATRIAL FIBRILLATION ABLATION: SHX5456

## 2012-11-08 LAB — POCT ACTIVATED CLOTTING TIME
Activated Clotting Time: 268 seconds
Activated Clotting Time: 290 seconds
Activated Clotting Time: 176 seconds

## 2012-11-08 LAB — POCT INR: INR: 2.7

## 2012-11-08 SURGERY — ATRIAL FIBRILLATION ABLATION
Anesthesia: Monitor Anesthesia Care

## 2012-11-08 SURGERY — ECHOCARDIOGRAM, TRANSESOPHAGEAL
Anesthesia: Moderate Sedation

## 2012-11-08 MED ORDER — PANTOPRAZOLE SODIUM 40 MG PO TBEC
40.0000 mg | DELAYED_RELEASE_TABLET | Freq: Every day | ORAL | Status: DC
  Administered 2012-11-08: 40 mg via ORAL
  Filled 2012-11-08: qty 1

## 2012-11-08 MED ORDER — ACETAMINOPHEN 325 MG PO TABS
650.0000 mg | ORAL_TABLET | ORAL | Status: DC | PRN
  Administered 2012-11-08: 650 mg via ORAL

## 2012-11-08 MED ORDER — FENTANYL CITRATE 0.05 MG/ML IJ SOLN
INTRAMUSCULAR | Status: DC | PRN
  Administered 2012-11-08 (×3): 25 ug via INTRAVENOUS

## 2012-11-08 MED ORDER — SODIUM CHLORIDE 0.9 % IV SOLN
INTRAVENOUS | Status: DC
  Administered 2012-11-08: 500 mL via INTRAVENOUS

## 2012-11-08 MED ORDER — LACTATED RINGERS IV SOLN
INTRAVENOUS | Status: DC | PRN
  Administered 2012-11-08: 11:00:00 via INTRAVENOUS

## 2012-11-08 MED ORDER — LIDOCAINE VISCOUS 2 % MT SOLN
OROMUCOSAL | Status: DC | PRN
  Administered 2012-11-08: 10 mL via OROMUCOSAL

## 2012-11-08 MED ORDER — WARFARIN - PHYSICIAN DOSING INPATIENT: Freq: Every day | Status: DC

## 2012-11-08 MED ORDER — HEPARIN SODIUM (PORCINE) 1000 UNIT/ML IJ SOLN
INTRAMUSCULAR | Status: DC | PRN
  Administered 2012-11-08: 4000 [IU] via INTRAVENOUS

## 2012-11-08 MED ORDER — DILTIAZEM HCL ER BEADS 240 MG PO CP24: 240.0000 mg | ORAL_CAPSULE | Freq: Two times a day (BID) | ORAL | Status: DC

## 2012-11-08 MED ORDER — SIMVASTATIN 40 MG PO TABS
40.0000 mg | ORAL_TABLET | Freq: Every day | ORAL | Status: DC
  Administered 2012-11-08: 40 mg via ORAL
  Filled 2012-11-08 (×2): qty 1

## 2012-11-08 MED ORDER — ONDANSETRON HCL 4 MG/2ML IJ SOLN: 4.0000 mg | Freq: Four times a day (QID) | INTRAMUSCULAR | Status: DC | PRN

## 2012-11-08 MED ORDER — HEPARIN SODIUM (PORCINE) 1000 UNIT/ML IJ SOLN
INTRAMUSCULAR | Status: AC
  Filled 2012-11-08: qty 1

## 2012-11-08 MED ORDER — DOFETILIDE 500 MCG PO CAPS
500.0000 ug | ORAL_CAPSULE | Freq: Two times a day (BID) | ORAL | Status: DC
  Administered 2012-11-08 – 2012-11-09 (×2): 500 ug via ORAL
  Filled 2012-11-08 (×3): qty 1

## 2012-11-08 MED ORDER — FENTANYL CITRATE 0.05 MG/ML IJ SOLN
INTRAMUSCULAR | Status: AC
  Filled 2012-11-08: qty 4

## 2012-11-08 MED ORDER — BUPIVACAINE HCL (PF) 0.25 % IJ SOLN
INTRAMUSCULAR | Status: AC
  Filled 2012-11-08: qty 30

## 2012-11-08 MED ORDER — ASPIRIN EC 81 MG PO TBEC
81.0000 mg | DELAYED_RELEASE_TABLET | Freq: Every day | ORAL | Status: DC
  Administered 2012-11-08 – 2012-11-09 (×2): 81 mg via ORAL
  Filled 2012-11-08 (×2): qty 1

## 2012-11-08 MED ORDER — SODIUM CHLORIDE 0.9 % IJ SOLN
3.0000 mL | Freq: Two times a day (BID) | INTRAMUSCULAR | Status: DC
  Administered 2012-11-08: 3 mL via INTRAVENOUS

## 2012-11-08 MED ORDER — DILTIAZEM HCL ER COATED BEADS 240 MG PO CP24
240.0000 mg | ORAL_CAPSULE | Freq: Two times a day (BID) | ORAL | Status: DC
  Administered 2012-11-08 – 2012-11-09 (×2): 240 mg via ORAL
  Filled 2012-11-08 (×3): qty 1

## 2012-11-08 MED ORDER — LISINOPRIL 20 MG PO TABS
20.0000 mg | ORAL_TABLET | Freq: Every day | ORAL | Status: DC
  Administered 2012-11-08: 20 mg via ORAL
  Filled 2012-11-08 (×2): qty 1

## 2012-11-08 MED ORDER — HYDROXYUREA 500 MG PO CAPS
ORAL_CAPSULE | ORAL | Status: AC
  Filled 2012-11-08: qty 1

## 2012-11-08 MED ORDER — PROPOFOL INFUSION 10 MG/ML OPTIME
INTRAVENOUS | Status: DC | PRN
  Administered 2012-11-08: 50 ug/kg/min via INTRAVENOUS

## 2012-11-08 MED ORDER — MIDAZOLAM HCL 5 MG/ML IJ SOLN
INTRAMUSCULAR | Status: AC
  Filled 2012-11-08: qty 4

## 2012-11-08 MED ORDER — SODIUM CHLORIDE 0.9 % IJ SOLN: 3.0000 mL | INTRAMUSCULAR | Status: DC | PRN

## 2012-11-08 MED ORDER — LIDOCAINE VISCOUS 2 % MT SOLN
OROMUCOSAL | Status: AC
  Filled 2012-11-08: qty 15

## 2012-11-08 MED ORDER — WARFARIN SODIUM 10 MG PO TABS
10.0000 mg | ORAL_TABLET | Freq: Every day | ORAL | Status: DC
Start: 2012-11-08 — End: 2012-11-09
  Administered 2012-11-08: 10 mg via ORAL
  Filled 2012-11-08 (×2): qty 1

## 2012-11-08 MED ORDER — SODIUM CHLORIDE 0.9 % IV SOLN: 250.0000 mL | INTRAVENOUS | Status: DC | PRN

## 2012-11-08 MED ORDER — MIDAZOLAM HCL 10 MG/2ML IJ SOLN
INTRAMUSCULAR | Status: DC | PRN
  Administered 2012-11-08: 2 mg via INTRAVENOUS
  Administered 2012-11-08: 1 mg via INTRAVENOUS
  Administered 2012-11-08: 2 mg via INTRAVENOUS
  Administered 2012-11-08: 1 mg via INTRAVENOUS

## 2012-11-08 MED ORDER — PROTAMINE SULFATE 10 MG/ML IV SOLN
INTRAVENOUS | Status: DC | PRN
  Administered 2012-11-08: 40 mg via INTRAVENOUS

## 2012-11-08 MED ORDER — SODIUM CHLORIDE 0.9 % IV SOLN: INTRAVENOUS | Status: DC

## 2012-11-08 MED ORDER — ACETAMINOPHEN 325 MG PO TABS
ORAL_TABLET | ORAL | Status: AC
  Filled 2012-11-08: qty 2

## 2012-11-08 NOTE — Interval H&P Note (Signed)
History and Physical Interval Note:  11/08/2012 10:19 AM  Mark [name]  has presented today for surgery, with the diagnosis of afib  The various methods of treatment have been discussed with the patient and family. After consideration of risks, benefits and other options for treatment, the patient has consented to  Procedure(s): ATRIAL FIBRILLATION ABLATION (N/A) as a surgical intervention .  The patient's history has been reviewed, patient examined, no change in status, stable for surgery.  I have reviewed the patient's chart and labs.  Questions were answered to the patient's satisfaction.     Hillis Range
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
## 2012-11-08 NOTE — Anesthesia Procedure Notes (Signed)
Procedure Name: MAC Date/Time: 11/08/2012 11:20 AM Performed by: Quentin Ore Pre-anesthesia Checklist: Patient identified, Emergency Drugs available, Suction available, Patient being monitored and Timeout performed Patient Re-evaluated:Patient Re-evaluated prior to inductionOxygen Delivery Method: Simple face mask Intubation Type: IV induction Placement Confirmation: positive ETCO2

## 2012-11-08 NOTE — Transfer of Care (Signed)
Immediate Anesthesia Transfer of Care Note  Patient: Mark [name]  Procedure(s) Performed: Procedure(s): ATRIAL FIBRILLATION ABLATION (N/A)  Patient Location: PACU  Anesthesia Type:MAC  Level of Consciousness: awake, alert  and oriented  Airway & Oxygen Therapy: Patient Spontanous Breathing and Patient connected to face mask oxygen  Post-op Assessment: Report given to PACU RN, Post -op Vital signs reviewed and stable and Patient moving all extremities  Post vital signs: Reviewed and stable  Complications: No apparent anesthesia complications

## 2012-11-08 NOTE — Interval H&P Note (Signed)
History and Physical Interval Note:  11/08/2012 10:24 AM  Mark [name]  has presented today for surgery, with the diagnosis of atrial fib  The various methods of treatment have been discussed with the patient and family. After consideration of risks, benefits and other options for treatment, the patient has consented to  Procedure(s): TRANSESOPHAGEAL ECHOCARDIOGRAM (TEE) (N/A) as a surgical intervention .  The patient's history has been reviewed, patient examined, no change in status, stable for surgery.  I have reviewed the patient's chart and labs.  Questions were answered to the patient's satisfaction.     [name]

## 2012-11-08 NOTE — Op Note (Signed)
SURGEON:  Hillis Range, MD  PREPROCEDURE DIAGNOSES: 1. Paroxysmal atrial fibrillation.  POSTPROCEDURE DIAGNOSES: 1. Paroxysmal  atrial fibrillation.  PROCEDURES: 1. Comprehensive electrophysiologic study. 2. Coronary sinus pacing and recording. 3. Three-dimensional mapping of atrial fibrillation 4. Ablation of atrial fibrillation 5. Arterial blood pressure monitoring. 6. Intracardiac echocardiography. 7. Transseptal puncture of an intact septum. 8. Elective cardioversion 9. Arrhythmia induction with pacing with isuprel infusion  INTRODUCTION:  Mark [name] is a 52 y.o. male with a history of paroxysmal atrial fibrillation who now presents for EP study and radiofrequency ablation.  The patient reports initially being diagnosed with atrial fibrillation after presenting with symptomatic palpitations and fatgiue. He failed medical therapy with tikosyn and underwent afib ablation 8/13.  He did well initially following ablation.  The patient reports increasing frequency and duration of atrial fibrillation since that time.  The patient therefore presents today for repeat catheter ablation of atrial fibrillation.  DESCRIPTION OF PROCEDURE:  Informed written consent was obtained, and the patient was brought to the electrophysiology lab in a fasting state.  The patient was adequately sedated with intravenous medications as outlined in the anesthesia report.  The patient's left and right groins were prepped and draped in the usual sterile fashion by the EP lab staff.  Using a percutaneous Seldinger technique, two 7-French and one 11-French hemostasis sheaths were placed into the right common femoral vein.      3 Dimensional CT MERGE: A previously rendered cardiac CT was used to reconstruct left atrial and pulmonary venous anatomy today.  Reprocessing at an independent work station was then performed.  This demonstrated a moderate to large sized left atrium with 4 separate pulmonary veins  which were also moderate in size.  There were no anomalous veins or significant abnormalities.  A 3 dimensional rendering of the left atrium was then merged using [employer] onto the [employer] system and registered with intracardiac echo (see below).     Catheter Placement:  A 7-French Biosense Webster Decapolar coronary sinus catheter was introduced through the right common femoral vein and advanced into the coronary sinus for recording and pacing from this location.  A 6-French quadripolar Josephson catheter was introduced through the right common femoral vein and advanced into the right ventricle for recording and pacing.  This catheter was then pulled back to the His bundle location.    Initial Measurements: The patient presented to the electrophysiology lab in sinus rhythm.  His PR interval measured 204 with a QRS duringation of 114 and a QT interval of 451 msec.  The AH interval measured 87 and the HV interval measured 48 msec.     Intracardiac Echocardiography: A 10-French Biosense Webster AcuNav intracardiac echocardiography catheter was introduced through the left common femoral vein and advanced into the right atrium. Intracardiac echocardiography was performed of the left atrium, and a three-dimensional anatomical rendering of the left atrium was performed using CARTO sound technology.  The patient was noted to have a moderate sized left atrium.  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 All 4 pulmonary veins were visualized and noted to have separate ostia.  The pulmonary veins were moderate in size.  The left atrial appendage was visualized and did not reveal thrombus.   There was no evidence of pulmonary vein stenosis.   Transseptal Puncture: The middle right common femoral vein sheath was exchanged for an 8.5 [place] SL2 transseptal sheath and transseptal access was achieved in a standard fashion using a Brockenbrough needle under biplane fluoroscopy with intracardiac  echocardiography confirmation of the transseptal puncture.  Once transseptal access had been achieved, heparin was administered intravenously and intra- arterially in order to maintain an ACT of greater than 350 seconds throughout the procedure.   3D Mapping and Ablation: The His bundle catheter was removed and in its place a 3.5 mm Biosense Webster EZ [employer] ablation catheter was advanced into the right atrium.  The transseptal sheath was pulled back into the IVC over a guidewire.  The ablation catheter was advanced across the transseptal hole using the wire as a guide.  The transseptal sheath was then re-advanced over the guidewire into the left atrium.  A duodecapolar Biosense Webster circular mapping catheter was introduced through the transseptal sheath and positioned over the mouth of all 4 pulmonary veins.  Three-dimensional electroanatomical mapping was performed using CARTO technology.  This demonstrated that the left superior and left inferior pulmonary veins were quiescent from the prior ablation procedure. There was mild return of conduction within the right superior pulmonary vein and prodigious conduction within the right inferior pulmonary vein.  The patient underwent successful electrical reisolation and anatomical encircling of all the right superior and inferior pulmonary veins using radiofrequency current with a circular mapping catheter as a guide.   Measurements Following Ablation: Following ablation, Isuprel was infused up to 20 mcg/min with no inducible atrial fibrillation, atrial tachycardia, atrial flutter, or sustained PACs. In sinus rhythm with RR interval was 1109, with PR , QRS 114 msec, and Qtc .  Following ablation the AH interval measured with an HV interval of 50 msec. Ventricular pacing was performed, which revealed midline decremental VA conduction with a VA Wenckebach cycle length of 350 msec during isuprel infustion. Rapid atrial pacing was  performed, which revealed an AV Wenckebach cycle length of 380 msec during isuprel infusion (600 msec at the start of the procedure today).  Electroisolation was then again confirmed in all four pulmonary veins. Rapid atrial pacing was continued down to a cycle length of 200 msec at which time he developed atrial fibrillation.  Initially the rhythm appeared as if it might organize into atrial flutter, however there was variability in the CS activation sequence and the cycle length was also irregular despite a surface ekg which appeared to be suggestive of atrial flutter.     Cardioversion: The patient was then cardioverted to sinus rhythm with a single synchronized 360-J biphasic shock with cardioversion electrodes in the anterior-posterior thoracic configuration. He maintained sinus rhythm initially.  Rapid atrial pacing was again performed down to a cycle length of and atrial fibrillation was again induced, requiring repeat cardioversion with 360J biphasic.  He remained in sinus rhythm thereafter.  The procedure was therefore considered completed.  All catheters were removed, and the sheaths were aspirated and flushed.  The patient was transferred to the recovery area for sheath removal per protocol.  A limited bedside transthoracic echocardiogram revealed no pericardial effusion.  There were no early apparent complications.  CONCLUSIONS: 1. Sinus rhythm upon presentation.   2. Left superior and left inferior pulmonary veins were quiescent from the prior ablation procedure. There was mild return of conduction within the right superior pulmonary vein and prodigious conduction within the right inferior pulmonary vein.   3. The patient underwent successful electrical reisolation and anatomical encircling of all the right superior and inferior pulmonary veins using radiofrequency current  4. No inducible arrhythmias following ablation both on and off of Isuprel 5. No early apparent  complications.   Fayrene Fearing Colbin Jovel,MD  1:42 PM 11/08/2012

## 2012-11-08 NOTE — Preoperative (Signed)
Beta Blockers   Reason not to administer Beta Blockers:Not Applicable 

## 2012-11-08 NOTE — CV Procedure (Signed)
    TRANSESOPHAGEAL ECHOCARDIOGRAM   NAME:  Mark [name]   MRN: [id number] DOB:  08-17-1961   ADMIT DATE: 11/08/2012  INDICATIONS:   PROCEDURE:   Informed consent was obtained prior to the procedure. The risks, benefits and alternatives for the procedure were discussed and the patient comprehended these risks.  Risks include, but are not limited to, cough, sore throat, vomiting, nausea, somnolence, esophageal and stomach trauma or perforation, bleeding, low blood pressure, aspiration, pneumonia, infection, trauma to the teeth and death.    After a procedural time-out, the patient was given 6 mg versed and 75 mcg fentanyl for moderate sedation.  The oropharynx was anesthetized 10 cc of topical 1% viscous lidocaine.  The transesophageal probe was inserted in the esophagus and stomach without difficulty and multiple views were obtained.    COMPLICATIONS:    There were no immediate complications.  FINDINGS:  LEFT VENTRICLE: EF = 60% No wall motion abnormalities  RIGHT VENTRICLE: Normal  LEFT ATRIUM: Moderately dilated 4.5 x 5.4 cm  LEFT ATRIAL APPENDAGE: No clot. Emptying velocity 80 cm/s  RIGHT ATRIUM: Normal. + Chiari network.  AORTIC VALVE:  Mechanical prosthesis. Trivial perivalvular AI  MITRAL VALVE:   Normal. No MR  TRICUSPID VALVE: Normal No TR  PULMONIC VALVE: No visualized  INTERATRIAL SEPTUM: + septal aneurysm. Small PFO  PERICARDIUM: Small circumfrential effusion  DESCENDING AORTA: not visualized

## 2012-11-08 NOTE — Progress Notes (Signed)
  Echocardiogram Echocardiogram Transesophageal has been performed.  Mark [name] 11/08/2012, 12:12 PM

## 2012-11-08 NOTE — Anesthesia Postprocedure Evaluation (Signed)
  Anesthesia Post-op Note  Patient: Mark [name]  Procedure(s) Performed: Procedure(s): ATRIAL FIBRILLATION ABLATION (N/A)  Patient Location: Cath Lab  Anesthesia Type:MAC  Level of Consciousness: awake and alert   Airway and Oxygen Therapy: Patient Spontanous Breathing  Post-op Pain: none  Post-op Assessment: Post-op Vital signs reviewed, Patient's Cardiovascular Status Stable, Respiratory Function Stable, Patent Airway, No signs of Nausea or vomiting and Pain level controlled  Post-op Vital Signs: stable  Complications: No apparent anesthesia complications

## 2012-11-08 NOTE — Anesthesia Preprocedure Evaluation (Signed)
Anesthesia Evaluation  Patient identified by MRN, date of birth, ID band Patient awake    Reviewed: Allergy & Precautions, H&P , NPO status , Patient's Chart, lab work & pertinent test results, reviewed documented beta blocker date and time   Airway Mallampati: II TM Distance: >3 FB Neck ROM: Full    Dental  (+) Teeth Intact and Dental Advisory Given   Pulmonary sleep apnea ,  breath sounds clear to auscultation        Cardiovascular hypertension, Pt. on medications + dysrhythmias Atrial Fibrillation + Valvular Problems/Murmurs AS Rhythm:Irregular Rate:Normal  S/P AVR for AS   Neuro/Psych  Headaches,    GI/Hepatic (+) Hepatitis -, A  Endo/Other    Renal/GU      Musculoskeletal   Abdominal (+) + obese,   Peds  Hematology   Anesthesia Other Findings   Reproductive/Obstetrics                           Anesthesia Physical Anesthesia Plan  ASA: III  Anesthesia Plan: MAC   Post-op Pain Management:    Induction: Intravenous  Airway Management Planned: Simple Face Mask  Additional Equipment:   Intra-op Plan:   Post-operative Plan:   Informed Consent: I have reviewed the patients History and Physical, chart, labs and discussed the procedure including the risks, benefits and alternatives for the proposed anesthesia with the patient or authorized representative who has indicated his/her understanding and acceptance.     Plan Discussed with: CRNA and Surgeon  Anesthesia Plan Comments:         Anesthesia Quick Evaluation

## 2012-11-09 DIAGNOSIS — I4891 Unspecified atrial fibrillation: Secondary | ICD-10-CM

## 2012-11-09 MED ORDER — MUPIROCIN 2 % EX OINT
1.0000 "application " | TOPICAL_OINTMENT | Freq: Two times a day (BID) | CUTANEOUS | Status: DC
  Filled 2012-11-09: qty 22

## 2012-11-09 MED ORDER — CHLORHEXIDINE GLUCONATE CLOTH 2 % EX PADS: 6.0000 | MEDICATED_PAD | Freq: Every day | CUTANEOUS | Status: DC

## 2012-11-09 NOTE — Discharge Summary (Signed)
ELECTROPHYSIOLOGY DISCHARGE SUMMARY    Patient ID: Mark [name],  MRN: [id number], DOB/AGE: 09-29-1960 52 y.o.  Admit date: 11/08/2012 Discharge date: 11/09/2012  Primary Care Physician: Neena Rhymes, MD Primary Cardiologist: Hillis Range, MD  Primary Discharge Diagnosis:  1. Recurrent, symptomatic AF s/p AF ablation  Secondary Discharge Diagnoses:  1. Bicuspid aortic valve s/p mechanical AVR 2. HTN 3. OSA 4. PAT  Procedures This Admission:  1. Transesophageal echo 11/08/2012 LEFT VENTRICLE: EF = 60% No wall motion abnormalities  RIGHT VENTRICLE: Normal  LEFT ATRIUM: Moderately dilated 4.5 x 5.4 cm  LEFT ATRIAL APPENDAGE: No clot. Emptying velocity 80 cm/s  RIGHT ATRIUM: Normal. + Chiari network.  AORTIC VALVE: Mechanical prosthesis. Trivial perivalvular AI  MITRAL VALVE: Normal. No MR  TRICUSPID VALVE: Normal No TR  PULMONIC VALVE: No visualized  INTERATRIAL SEPTUM: + septal aneurysm. Small PFO  PERICARDIUM: Small circumfrential effusion  DESCENDING AORTA: not visualized 2. AF ablation 11/08/2012 - Comprehensive electrophysiologic study.  - Coronary sinus pacing and recording.  - Three-dimensional mapping of atrial fibrillation  - Ablation of atrial fibrillation  - Arterial blood pressure monitoring.  - Intracardiac echocardiography.  - Transseptal puncture of an intact septum.  - Elective cardioversion  - Arrhythmia induction with pacing with isuprel infusion CONCLUSIONS:  1. Sinus rhythm upon presentation.  2. Left superior and left inferior pulmonary veins were quiescent from the prior ablation procedure. There was mild return of conduction within the right superior pulmonary vein and prodigious conduction within the right inferior pulmonary vein.  3. The patient underwent successful electrical reisolation and anatomical encircling of all the right superior and inferior pulmonary veins using radiofrequency current  4. No inducible arrhythmias  following ablation both on and off of Isuprel  5. No early apparent complications.  History and Hospital Course:  Mark [name] is a 52 year old man with recurrent, symptomatic AF and prior AF ablation August 2013 who presented yesterday for repeat AF ablation. He tolerated this procedure well without any immediate complication. He remains hemodynamically stable and afebrile. His groin site is intact without significant bleeding or hematoma. Telemetry reveals SR with no arrhythmias. He has been given discharge instructions including wound care and activity restrictions. He will continue PPI therapy/Protonix for at least 6 weeks. There were no changes made to his medications. He will follow-up in clinic in 12 weeks. She has been seen, examined and deemed stable for discharge today by Dr. Lewayne Bunting.  Physical Exam:  Vitals: Blood pressure 112/56, pulse 61, temperature 98.5 F (36.9 C), temperature source Oral, resp. rate 16, height 5\' 11"  (1.803 m), weight 243 lb (110.224 kg), SpO2 93.00%.  General: WD, well appearing 52 year old male in no acute distress. Heart: RRR. S1, S2 present with mechanical valve sounds noted. No murmur, rub or S3.  Lungs: CTA bilaterally. No wheezes, rales or rhonchi. Abdomen: Soft, nondistended. Extremities: No cyanosis, clubbing or edema. Right groin intact without hematoma.  Labs: Lab Results  Component Value Date   WBC 5.1 10/31/2012   HGB 14.1 10/31/2012   HCT 41.7 10/31/2012   MCV 89.7 10/31/2012   PLT 147.0* 10/31/2012   No results found for this basename: NA, K, CL, CO2, BUN, CREATININE, CALCIUM, LABALBU, PROT, BILITOT, ALKPHOS, ALT, AST, GLUCOSE,  in the last 168 hours  Recent Labs  11/08/12 0838  INR 2.7    Disposition:  The patient is being discharged in stable condition.  Follow-up:     Follow-up Information   Follow up with  [facility] On 11/15/2012. (At 7:30 AM for Coumadin follow-up)    Contact information:   [address] [place] [address] [phone]      Follow up with Hillis Range, MD On 02/09/2013. (At 8:45 AM)    Contact information:   [address] [place] [address] [phone]     Discharge Medications:    Medication List    TAKE these medications       aspirin EC 81 MG tablet  Take 81 mg by mouth daily.     diltiazem 240 MG 24 hr capsule  Commonly known as:  TIAZAC  Take 1 capsule (240 mg total) by mouth 2 (two) times daily.     dofetilide 500 MCG capsule  Commonly known as:  TIKOSYN  Take 1 capsule (500 mcg total) by mouth every 12 (twelve) hours.     ibuprofen 200 MG tablet  Commonly known as:  ADVIL,MOTRIN  Take 600 mg by mouth daily as needed for pain. For pain     lisinopril 20 MG tablet  Commonly known as:  PRINIVIL,ZESTRIL  Take 1 tablet (20 mg total) by mouth at bedtime.     pantoprazole 40 MG tablet  Commonly known as:  PROTONIX  Take 1 tablet (40 mg total) by mouth at bedtime.     simvastatin 40 MG tablet  Commonly known as:  ZOCOR  Take 40 mg by mouth at bedtime.     warfarin 10 MG tablet  Commonly known as:  COUMADIN  Take 10 mg by mouth daily.       Duration of Discharge Encounter: Greater than 30 minutes including physician time.  Signed, Rick Duff, PA-C 11/09/2012, 7:18 AM  EP Attending  Patient seen and examined. Agree with above. He is maintaining NSR and groin is stable. He will be discharged on his pre-op meds and followup with Dr. Johney Frame as per usual.   Leonia Reeves.D.

## 2012-11-10 ENCOUNTER — Encounter (HOSPITAL_COMMUNITY): Payer: Self-pay | Admitting: Internal Medicine

## 2012-11-15 ENCOUNTER — Ambulatory Visit (INDEPENDENT_AMBULATORY_CARE_PROVIDER_SITE_OTHER): Payer: No Typology Code available for payment source | Admitting: *Deleted

## 2012-11-15 DIAGNOSIS — Z954 Presence of other heart-valve replacement: Secondary | ICD-10-CM

## 2012-11-15 DIAGNOSIS — Z952 Presence of prosthetic heart valve: Secondary | ICD-10-CM

## 2012-11-15 DIAGNOSIS — Z7901 Long term (current) use of anticoagulants: Secondary | ICD-10-CM

## 2012-11-15 DIAGNOSIS — I4891 Unspecified atrial fibrillation: Secondary | ICD-10-CM

## 2012-11-15 LAB — POCT INR: INR: 3.5

## 2012-11-19 ENCOUNTER — Telehealth: Payer: Self-pay | Admitting: Adult Health

## 2012-11-19 NOTE — Telephone Encounter (Signed)
Received page from patient of Dr.[name]. Had ablation last week. On diltizem 240 mg BID. HR in the 40s with symptoms of dizziness and lightheadedness.  Feels weak. Also on Tikosyn.  He has 180 mg doses at home as well. I have asked him to take 180 mg tablet BID. He needs follow up call on Monday to check his status and schedule appt for EKG and medication adjustment.

## 2012-11-20 ENCOUNTER — Telehealth: Payer: Self-pay | Admitting: Cardiology

## 2012-11-20 ENCOUNTER — Observation Stay (HOSPITAL_COMMUNITY)
Admission: EM | Admit: 2012-11-20 | Discharge: 2012-11-21 | Disposition: A | Discharge status: Home / Self Care | Payer: No Typology Code available for payment source | Attending: Cardiology | Admitting: Cardiology

## 2012-11-20 ENCOUNTER — Emergency Department (HOSPITAL_COMMUNITY): Payer: No Typology Code available for payment source

## 2012-11-20 ENCOUNTER — Encounter (HOSPITAL_COMMUNITY): Payer: Self-pay | Admitting: *Deleted

## 2012-11-20 DIAGNOSIS — Z954 Presence of other heart-valve replacement: Secondary | ICD-10-CM | POA: Insufficient documentation

## 2012-11-20 DIAGNOSIS — G4733 Obstructive sleep apnea (adult) (pediatric): Secondary | ICD-10-CM | POA: Insufficient documentation

## 2012-11-20 DIAGNOSIS — I498 Other specified cardiac arrhythmias: Principal | ICD-10-CM

## 2012-11-20 DIAGNOSIS — R42 Dizziness and giddiness: Secondary | ICD-10-CM | POA: Insufficient documentation

## 2012-11-20 DIAGNOSIS — I10 Essential (primary) hypertension: Secondary | ICD-10-CM | POA: Insufficient documentation

## 2012-11-20 DIAGNOSIS — Z7901 Long term (current) use of anticoagulants: Secondary | ICD-10-CM | POA: Insufficient documentation

## 2012-11-20 DIAGNOSIS — R5381 Other malaise: Secondary | ICD-10-CM | POA: Insufficient documentation

## 2012-11-20 DIAGNOSIS — I4891 Unspecified atrial fibrillation: Secondary | ICD-10-CM | POA: Insufficient documentation

## 2012-11-20 DIAGNOSIS — R001 Bradycardia, unspecified: Secondary | ICD-10-CM

## 2012-11-20 LAB — CBC
HCT: 42.4 % (ref 39.0–52.0)
Hemoglobin: 15.2 g/dL (ref 13.0–17.0)
MCH: 31.1 pg (ref 26.0–34.0)
MCHC: 35.8 g/dL (ref 30.0–36.0)
MCV: 86.7 fL (ref 78.0–100.0)
Platelets: 185 10*3/uL (ref 150–400)
RBC: 4.89 MIL/uL (ref 4.22–5.81)
RDW: 12.6 % (ref 11.5–15.5)
WBC: 6.7 10*3/uL (ref 4.0–10.5)

## 2012-11-20 LAB — BASIC METABOLIC PANEL
BUN: 22 mg/dL (ref 6–23)
CO2: 26 mEq/L (ref 19–32)
Calcium: 9.3 mg/dL (ref 8.4–10.5)
Chloride: 104 mEq/L (ref 96–112)
Creatinine, Ser: 1.22 mg/dL (ref 0.50–1.35)
GFR calc Af Amer: 79 mL/min — ABNORMAL LOW (ref >90)
GFR calc non Af Amer: 68 mL/min — ABNORMAL LOW (ref >90)
Glucose, Bld: 108 mg/dL — ABNORMAL HIGH (ref 70–99)
Potassium: 4.2 mEq/L (ref 3.5–5.1)
Sodium: 142 mEq/L (ref 135–145)

## 2012-11-20 LAB — PROTIME-INR
INR: 2.77 — ABNORMAL HIGH (ref 0.00–1.49)
Prothrombin Time: 27.9 seconds — ABNORMAL HIGH (ref 11.6–15.2)

## 2012-11-20 LAB — MRSA PCR SCREENING: MRSA by PCR: POSITIVE — AB

## 2012-11-20 LAB — MAGNESIUM: Magnesium: 2.3 mg/dL (ref 1.5–2.5)

## 2012-11-20 LAB — POCT I-STAT TROPONIN I: Troponin i, poc: 0 ng/mL (ref 0.00–0.08)

## 2012-11-20 MED ORDER — SODIUM CHLORIDE 0.9 % IV SOLN: 250.0000 mL | INTRAVENOUS | Status: DC | PRN

## 2012-11-20 MED ORDER — SODIUM CHLORIDE 0.9 % IJ SOLN
3.0000 mL | Freq: Two times a day (BID) | INTRAMUSCULAR | Status: DC
  Administered 2012-11-21: 3 mL via INTRAVENOUS

## 2012-11-20 MED ORDER — DOFETILIDE 500 MCG PO CAPS
500.0000 ug | ORAL_CAPSULE | Freq: Two times a day (BID) | ORAL | Status: DC
  Administered 2012-11-20 – 2012-11-21 (×2): 500 ug via ORAL
  Filled 2012-11-20 (×3): qty 1

## 2012-11-20 MED ORDER — NALOXONE HCL 0.4 MG/ML IJ SOLN: 0.4000 mg | Freq: Once | INTRAMUSCULAR | Status: DC

## 2012-11-20 MED ORDER — PANTOPRAZOLE SODIUM 40 MG PO TBEC
40.0000 mg | DELAYED_RELEASE_TABLET | Freq: Two times a day (BID) | ORAL | Status: DC
  Administered 2012-11-20 – 2012-11-21 (×2): 40 mg via ORAL
  Filled 2012-11-20 (×2): qty 1

## 2012-11-20 MED ORDER — ONDANSETRON HCL 4 MG/2ML IJ SOLN: 4.0000 mg | Freq: Four times a day (QID) | INTRAMUSCULAR | Status: DC | PRN

## 2012-11-20 MED ORDER — WARFARIN - PHARMACIST DOSING INPATIENT: Freq: Every day | Status: DC

## 2012-11-20 MED ORDER — LISINOPRIL 20 MG PO TABS
20.0000 mg | ORAL_TABLET | Freq: Every day | ORAL | Status: DC
  Filled 2012-11-20 (×2): qty 1

## 2012-11-20 MED ORDER — SIMVASTATIN 40 MG PO TABS
40.0000 mg | ORAL_TABLET | Freq: Every day | ORAL | Status: DC
  Administered 2012-11-20: 40 mg via ORAL
  Filled 2012-11-20 (×2): qty 1

## 2012-11-20 MED ORDER — ACETAMINOPHEN 325 MG PO TABS
650.0000 mg | ORAL_TABLET | ORAL | Status: DC | PRN
  Administered 2012-11-20: 650 mg via ORAL
  Filled 2012-11-20: qty 2

## 2012-11-20 MED ORDER — SODIUM CHLORIDE 0.9 % IJ SOLN: 3.0000 mL | INTRAMUSCULAR | Status: DC | PRN

## 2012-11-20 MED ORDER — ASPIRIN EC 81 MG PO TBEC
81.0000 mg | DELAYED_RELEASE_TABLET | Freq: Every day | ORAL | Status: DC
Start: 2012-11-20 — End: 2012-11-21
  Administered 2012-11-20 – 2012-11-21 (×2): 81 mg via ORAL
  Filled 2012-11-20 (×2): qty 1

## 2012-11-20 MED ORDER — WARFARIN SODIUM 10 MG PO TABS
10.0000 mg | ORAL_TABLET | Freq: Every day | ORAL | Status: DC
  Administered 2012-11-20: 10 mg via ORAL
  Filled 2012-11-20 (×2): qty 1

## 2012-11-20 NOTE — Progress Notes (Signed)
Pt's HR low 40's dropping to 38 non sustained.  He is asymptomatic. HR limits previously decreased to 39 at [facility]. Pt continues to ring off Extreme [place] Foley. Spoke with Harriet Pho NP who  Stated it was OK to drop HR limits to 35.  CCMD called and informed. Will continue to monitor. Dierdre Highman, RN
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
## 2012-11-20 NOTE — ED Notes (Signed)
Cardiology at bedside.

## 2012-11-20 NOTE — Telephone Encounter (Signed)
Called from the ER.  Patient asymptomatic but pulse is in the 40s. I spoke with Heather.  She stated the patient was asymptomatic and the ECG showed sinus bradycardia.  He was on diltizem 240mg  bid.  He is on Tikosyn 500 mg bid.

## 2012-11-20 NOTE — ED Notes (Addendum)
Pt reports not feeling well x 2 days, feeling fatigued and feels like his HR is down. Had cardiac ablation done two weeks ago. HR 53 at triage, no acute distress noted, ekg being done. Denies having any cp, reports HR was 38 this am, did not take any of his meds this morning.
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
## 2012-11-20 NOTE — ED Provider Notes (Signed)
Medical screening examination/treatment/procedure(s) were conducted as a shared visit with non-physician practitioner(s) and myself.  I personally evaluated the patient during the encounter  The patient seen by me. Patient's followed by [facility]. Patient had an ablation a few days ago. Called in to cardiology for heart rates in the 40s yesterday. Patient's been very tired and fatigued particularly with walking. No passing out. The patient was so concerned about the heart rate being slow he hasn't been able to sleep. Yesterday they advised that he cut back on his diltiazem a.m. Today patient hasn't taken any diltiazem and her heart rate is still around 38. EKG and monitor consistent with a sinus bradycardia but it is fairly extreme.  Discussed with [facility] they will see the patient in consultation also the family wanted to be seen nonspecifically by her cardiologist today because of concerns. [facility] has seen the patient and they will admit and observe.  Results for orders placed during the hospital encounter of 11/20/12  CBC      Result Value Range   WBC 6.7  4.0 - 10.5 K/uL   RBC 4.89  4.22 - 5.81 MIL/uL   Hemoglobin 15.2  13.0 - 17.0 g/dL   HCT 16.1  09.6 - 04.5 %   MCV 86.7  78.0 - 100.0 fL   MCH 31.1  26.0 - 34.0 pg   MCHC 35.8  30.0 - 36.0 g/dL   RDW 40.9  81.1 - 91.4 %   Platelets 185  150 - 400 K/uL  BASIC METABOLIC PANEL      Result Value Range   Sodium 142  135 - 145 mEq/L   Potassium 4.2  3.5 - 5.1 mEq/L   Chloride 104  96 - 112 mEq/L   CO2 26  19 - 32 mEq/L   Glucose, Bld 108 (*) 70 - 99 mg/dL   BUN 22  6 - 23 mg/dL   Creatinine, Ser 7.82  0.50 - 1.35 mg/dL   Calcium 9.3  8.4 - 95.6 mg/dL   GFR calc non Af Amer 68 (*) >90 mL/min   GFR calc Af Amer 79 (*) >90 mL/min  POCT I-STAT TROPONIN I      Result Value Range   Troponin i, poc 0.00  0.00 - 0.08 ng/mL   Comment 3              Shelda Jakes, MD 11/20/12 1627

## 2012-11-20 NOTE — Telephone Encounter (Signed)
Patient came in to ER and we were called by [facility].  He is asymptomatic at present.  HR is in the 40s.  I reviewed the ECG.  He had held his diltiazem last pm night and both meds this am.  His HR is in the 40s.  Had ablation last week and currently in NSR.  QTc is normal.  I suggested he resume his Tikosyn in the am and hold his diltiazem for now given the slow rate.  He is to call our office in the morning to get instructions from Dr. Johney Frame.

## 2012-11-20 NOTE — Progress Notes (Signed)
ANTICOAGULATION CONSULT NOTE - Initial Consult  Pharmacy Consult:  Coumadin Indication:   Atrial fibrillation and history of mechanical AVR  Allergies  Allergen Reactions  . Morphine And Related Nausea And Vomiting  . Percocet (Oxycodone-Acetaminophen) Nausea And Vomiting    Patient Measurements: Height: 5' 10.87" (180 cm) Weight: 242 lb 15.2 oz (110.2 kg) IBW/kg (Calculated) : 74.99  Vital Signs: Temp: 98.1 F (36.7 C) (03/23 1116) Temp src: Oral (03/23 1116) BP: 119/75 mmHg (03/23 1700) Pulse Rate: 39 (03/23 1700)  Labs:  Recent Labs  11/20/12 1115  HGB 15.2  HCT 42.4  PLT 185  CREATININE 1.22    Estimated Creatinine Clearance: 92.3 ml/min (by C-G formula based on Cr of 1.22).   Medical History: Past Medical History  Diagnosis Date  . Ascending aortic aneurysm   . Gallstones   . Aortic stenosis     a. due to bicuspid AoV; 1983 S/p mechanical AVR (St. Jude) - chronic coumadin with INR run between 3.5-4.5;  b. 03/2012 Echo: EF 60%, nl wall motion, mod dil LA.  . OSA (obstructive sleep apnea)     a. noncompliant with CPAP  . Hypertension   . Paroxysmal atrial fibrillation     a. s/p RFCA 03/2012;  b. Tikosyn d/c'd 07/2012;  c. Chronic coumadin;  d. Recurrent symptomatic PAF->s/p repeat RFCA 10/2012.  Marland Kitchen Hepatitis A     a. as a child (from [place])  . Headache   . H/O cardiac catheterization     a. 12/2007 Cath: nl cors.  . Atrial tachycardia     a. admitted 07/2012        Assessment: 57 YOM with history of St. Jude mechanical AVR in 1983 to continue Coumadin for symptomatic AFib.  INR therapeutic on home Coumadin dose of 10mg  PO daily.  No bleeding reported.   Goal of Therapy:  INR 2.5 - 3.5 Monitor platelets by anticoagulation protocol: Yes    Plan:  - Continue Coumadin 10mg  PO daily at 1800 - Daily PT / INR for now     Adrena Nakamura D. Laney Potash, PharmD, BCPS Pager:  [phone] 11/20/2012, 6:49 PM

## 2012-11-21 ENCOUNTER — Encounter (HOSPITAL_COMMUNITY): Payer: Self-pay | Admitting: General Practice

## 2012-11-21 LAB — PROTIME-INR
INR: 2.8 — ABNORMAL HIGH (ref 0.00–1.49)
Prothrombin Time: 28.1 seconds — ABNORMAL HIGH (ref 11.6–15.2)

## 2012-11-21 MED ORDER — INFLUENZA VIRUS VACC SPLIT PF IM SUSP: 0.5000 mL | INTRAMUSCULAR | Status: DC

## 2012-11-21 MED ORDER — DILTIAZEM HCL ER BEADS 180 MG PO CP24: 180.0000 mg | ORAL_CAPSULE | Freq: Every day | ORAL | Status: DC | PRN

## 2012-11-21 MED ORDER — INFLUENZA VIRUS VACC SPLIT PF IM SUSP
0.5000 mL | Freq: Once | INTRAMUSCULAR | Status: AC
  Administered 2012-11-21: 0.5 mL via INTRAMUSCULAR
  Filled 2012-11-21: qty 0.5

## 2012-11-21 MED ORDER — IPRATROPIUM BROMIDE 0.02 % IN SOLN
RESPIRATORY_TRACT | Status: AC
  Filled 2012-11-21: qty 2.5

## 2012-11-21 MED ORDER — ALBUTEROL SULFATE (5 MG/ML) 0.5% IN NEBU
INHALATION_SOLUTION | RESPIRATORY_TRACT | Status: AC
  Filled 2012-11-21: qty 0.5

## 2012-11-21 NOTE — Progress Notes (Addendum)
Called to assist with DC orders. Per Dr. Odessa Fleming note, will use Diltiazem 180mg  daily PRN for recurrent afib, o/w continuing home meds. Discussed INR goal with him and he advised continuing current Coumadin dose and ASA. He will f/u as scheduled in Coumadin clinic and has f/u with Dr. Johney Frame in June. DC summary to follow by Rick Duff. Dayna Dunn PA-C

## 2012-11-21 NOTE — Progress Notes (Signed)
Utilization review completed.  
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
## 2012-11-21 NOTE — ED Provider Notes (Signed)
Medical screening examination/treatment/procedure(s) were conducted as a shared visit with non-physician practitioner(s) and myself.  I personally evaluated the patient during the encounter   Shelda Jakes, MD 11/21/12 [phone]

## 2012-11-21 NOTE — Discharge Summary (Signed)
CARDIOLOGY DISCHARGE SUMMARY    Patient ID: Mark [name],  MRN: [id number], DOB/AGE: Nov 23, 1960 52 y.o.  Admit date: 11/20/2012 Discharge date: 11/28/2012  Primary Care Physician: Neena Rhymes, MD Primary Cardiologist: Gala Romney, MD Primary EP: Johney Frame, MD  Primary Discharge Diagnosis:  1. Symptomatic bradycardia  Secondary Discharge Diagnoses:  1. PAF s/p AF ablation x2 2. Bicuspid aortic valve s/p mechanical AVR 3. HTN 4. OSA  Procedures This Admission:  None   History: Mark [name] is a 52 year old man with h/o bicuspic aortic valve on chronic Coumadin and PAF s/p RFCA in August 2013 with subsequent discontinuation of diltiazem and Tikosyn. He then developed recurrent, symptomatic PAF with resumption and up-titration of diltiazem followed by reloading of Tikosyn. He ultimately underwent repeat RFCA on 11/08/2012. Mark [name] reports that since his most recent ablation, he has experienced generalized malaise and low energy with intermittent lightheadedness. He has also had mild chest soreness and GERD symptoms, which he believes are reasonably controlled with Protonix. He has not had any symptomatic recurrence of AF. Due to persistent malaise, he checked his BP and HR on Friday and found his HR to be in the 30's and 40's. This persisted Friday and Saturday and he called in through the answering service last night and was advised to reduce his diltiazem to 180 mg BID. His HR remained in the 40's despite holding his diltiazem so he came in to the ED.   Hospital Course:  Mark [name] was observed overnight. Diltiazem was discontinued with improvement in his heart rate. Telemetry reveals sinus rhythm with intermittent sinus bradycardia. He did not experience any significant AV block or pause. He was ambulating without difficulty. He remained hemodynamically stable and has been seen, examined and deemed stable for discharge today by Dr. Berton Mount.   Discharge  Vitals: Blood pressure 105/68, pulse 105, temperature 97.8 F (36.6 C), temperature source Oral, resp. rate 18, height 5' 10.87" (1.8 m), weight 242 lb 15.2 oz (110.2 kg), SpO2 98.00%.   Labs: Lab Results  Component Value Date   WBC 6.7 11/20/2012   HGB 15.2 11/20/2012   HCT 42.4 11/20/2012   MCV 86.7 11/20/2012   PLT 185 11/20/2012   No results found for this basename: NA, K, CL, CO2, BUN, CREATININE, CALCIUM, LABALBU, PROT, BILITOT, ALKPHOS, ALT, AST, GLUCOSE,  in the last 168 hours No results found for this basename: INR,  in the last 72 hours  Disposition:  The patient is being discharged in stable condition.  Follow-up:     Follow-up Information   Follow up with [facility]. (11/22/12 at 7:30am)    Contact information:   [facility] [address], [address] [place] [address] [phone]       Follow up with Hillis Range, MD. (02/09/13 at 8:45am)    Contact information:   [address], [address] [place] [address] [phone]      Discharge Medications:    Medication List    TAKE these medications       aspirin EC 81 MG tablet  Take 81 mg by mouth daily.     diltiazem 180 MG 24 hr capsule  Commonly known as:  TIAZAC  Take 1 capsule (180 mg total) by mouth daily as needed (recurrent atrial fibrillation).     dofetilide 500 MCG capsule  Commonly known as:  TIKOSYN  Take 1 capsule (500 mcg total) by mouth every 12 (twelve) hours.     ibuprofen 200 MG tablet  Commonly known as:  ADVIL,MOTRIN  Take 600 mg by mouth daily as needed for pain. For pain     lisinopril 20 MG tablet  Commonly known as:  PRINIVIL,ZESTRIL  Take 1 tablet (20 mg total) by mouth at bedtime.     pantoprazole 40 MG tablet  Commonly known as:  PROTONIX  Take 1 tablet (40 mg total) by mouth at bedtime.     simvastatin 40 MG tablet  Commonly known as:  ZOCOR  Take 40 mg by mouth at bedtime.     warfarin 10 MG tablet  Commonly known as:  COUMADIN  Take 10 mg by mouth  daily.       Duration of Discharge Encounter: Greater than 30 minutes including physician time.  Signed, Rick Duff, PA-C 11/28/2012, 7:33 AM
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
## 2012-11-21 NOTE — Progress Notes (Signed)
Reviewed discharge instructions with patient and wife, they stated their understanding.  Flu shot administered prior to discharge.  Patient discharged home with wife, insistent he drive himself home.  Mark [name]

## 2012-11-23 ENCOUNTER — Telehealth: Payer: Self-pay | Admitting: *Deleted

## 2012-11-23 NOTE — Telephone Encounter (Signed)
TCM Mark [name]: Spoke with Mark [name]; since being in the ER. Mark [name] is doing well; he has all the medications needed. Mark [name] ids aware of appointment with Dr. Johney Frame on 02/09/13 a 8:30 AM.

## 2012-11-24 NOTE — Telephone Encounter (Signed)
Patient was admitted. TS

## 2012-12-08 ENCOUNTER — Ambulatory Visit (INDEPENDENT_AMBULATORY_CARE_PROVIDER_SITE_OTHER): Payer: No Typology Code available for payment source

## 2012-12-08 DIAGNOSIS — Z954 Presence of other heart-valve replacement: Secondary | ICD-10-CM

## 2012-12-08 DIAGNOSIS — Z952 Presence of prosthetic heart valve: Secondary | ICD-10-CM

## 2012-12-08 DIAGNOSIS — I4891 Unspecified atrial fibrillation: Secondary | ICD-10-CM

## 2012-12-08 DIAGNOSIS — Z7901 Long term (current) use of anticoagulants: Secondary | ICD-10-CM

## 2012-12-08 LAB — POCT INR: INR: 2.8

## 2012-12-15 ENCOUNTER — Telehealth: Payer: Self-pay | Admitting: Internal Medicine

## 2012-12-15 NOTE — Telephone Encounter (Signed)
Pt calls for clarification about the Diltiazem 180mg  ER.  Pt had an episode of Atrial fib yesterday evening.  He took a Diltiazem last pm (8:30)  Atrial fib resolved around midnight according to pt. 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  Reviewed discharge medications with pt  & is aware he can take a diltiazem ER daily as needed only if he has recurrent afib Mylo Red RN

## 2012-12-15 NOTE — Telephone Encounter (Signed)
New problem    Pt wants to know if he should start back taking diltiazem 180 mg

## 2012-12-23 ENCOUNTER — Emergency Department (HOSPITAL_COMMUNITY)
Admission: EM | Admit: 2012-12-23 | Discharge: 2012-12-23 | Disposition: A | Discharge status: Home / Self Care | Payer: No Typology Code available for payment source | Attending: Emergency Medicine | Admitting: Emergency Medicine

## 2012-12-23 ENCOUNTER — Emergency Department (HOSPITAL_COMMUNITY): Payer: No Typology Code available for payment source

## 2012-12-23 ENCOUNTER — Encounter (HOSPITAL_COMMUNITY): Payer: Self-pay | Admitting: Emergency Medicine

## 2012-12-23 ENCOUNTER — Telehealth: Payer: Self-pay | Admitting: *Deleted

## 2012-12-23 DIAGNOSIS — Z8619 Personal history of other infectious and parasitic diseases: Secondary | ICD-10-CM | POA: Insufficient documentation

## 2012-12-23 DIAGNOSIS — K8020 Calculus of gallbladder without cholecystitis without obstruction: Secondary | ICD-10-CM | POA: Insufficient documentation

## 2012-12-23 DIAGNOSIS — Z7982 Long term (current) use of aspirin: Secondary | ICD-10-CM | POA: Insufficient documentation

## 2012-12-23 DIAGNOSIS — R079 Chest pain, unspecified: Secondary | ICD-10-CM

## 2012-12-23 DIAGNOSIS — I4891 Unspecified atrial fibrillation: Secondary | ICD-10-CM | POA: Insufficient documentation

## 2012-12-23 DIAGNOSIS — R110 Nausea: Secondary | ICD-10-CM | POA: Insufficient documentation

## 2012-12-23 DIAGNOSIS — R072 Precordial pain: Secondary | ICD-10-CM | POA: Insufficient documentation

## 2012-12-23 DIAGNOSIS — Z79899 Other long term (current) drug therapy: Secondary | ICD-10-CM | POA: Insufficient documentation

## 2012-12-23 DIAGNOSIS — Z8679 Personal history of other diseases of the circulatory system: Secondary | ICD-10-CM | POA: Insufficient documentation

## 2012-12-23 DIAGNOSIS — I10 Essential (primary) hypertension: Secondary | ICD-10-CM | POA: Insufficient documentation

## 2012-12-23 DIAGNOSIS — I2510 Atherosclerotic heart disease of native coronary artery without angina pectoris: Secondary | ICD-10-CM | POA: Insufficient documentation

## 2012-12-23 DIAGNOSIS — Z8669 Personal history of other diseases of the nervous system and sense organs: Secondary | ICD-10-CM | POA: Insufficient documentation

## 2012-12-23 DIAGNOSIS — Z7901 Long term (current) use of anticoagulants: Secondary | ICD-10-CM | POA: Insufficient documentation

## 2012-12-23 LAB — CBC
HCT: 43 % (ref 39.0–52.0)
Hemoglobin: 15.4 g/dL (ref 13.0–17.0)
MCH: 30.6 pg (ref 26.0–34.0)
MCHC: 35.8 g/dL (ref 30.0–36.0)
MCV: 85.5 fL (ref 78.0–100.0)
Platelets: 171 10*3/uL (ref 150–400)
RBC: 5.03 MIL/uL (ref 4.22–5.81)
RDW: 12.5 % (ref 11.5–15.5)
WBC: 5.6 10*3/uL (ref 4.0–10.5)

## 2012-12-23 LAB — LIPASE, BLOOD: Lipase: 28 U/L (ref 11–59)

## 2012-12-23 LAB — BASIC METABOLIC PANEL
BUN: 22 mg/dL (ref 6–23)
CO2: 23 mEq/L (ref 19–32)
Calcium: 9.6 mg/dL (ref 8.4–10.5)
Chloride: 102 mEq/L (ref 96–112)
Creatinine, Ser: 1.18 mg/dL (ref 0.50–1.35)
GFR calc Af Amer: 82 mL/min — ABNORMAL LOW (ref >90)
GFR calc non Af Amer: 71 mL/min — ABNORMAL LOW (ref >90)
Glucose, Bld: 116 mg/dL — ABNORMAL HIGH (ref 70–99)
Potassium: 3.6 mEq/L (ref 3.5–5.1)
Sodium: 136 mEq/L (ref 135–145)

## 2012-12-23 LAB — POCT I-STAT TROPONIN I
Troponin i, poc: 0 ng/mL (ref 0.00–0.08)
Troponin i, poc: 0 ng/mL (ref 0.00–0.08)

## 2012-12-23 LAB — HEPATIC FUNCTION PANEL
ALT: 23 U/L (ref 0–53)
AST: 24 U/L (ref 0–37)
Albumin: 4.5 g/dL (ref 3.5–5.2)
Alkaline Phosphatase: 77 U/L (ref 39–117)
Bilirubin, Direct: 0.2 mg/dL (ref 0.0–0.3)
Indirect Bilirubin: 0.9 mg/dL (ref 0.3–0.9)
Total Bilirubin: 1.1 mg/dL (ref 0.3–1.2)
Total Protein: 7.8 g/dL (ref 6.0–8.3)

## 2012-12-23 LAB — PROTIME-INR
INR: 3.27 — ABNORMAL HIGH (ref 0.00–1.49)
Prothrombin Time: 31.5 seconds — ABNORMAL HIGH (ref 11.6–15.2)

## 2012-12-23 MED ORDER — NITROGLYCERIN 0.4 MG SL SUBL
0.4000 mg | SUBLINGUAL_TABLET | SUBLINGUAL | Status: DC | PRN
  Administered 2012-12-23 (×3): 0.4 mg via SUBLINGUAL
  Filled 2012-12-23: qty 25

## 2012-12-23 MED ORDER — GI COCKTAIL ~~LOC~~
30.0000 mL | Freq: Once | ORAL | Status: AC
  Administered 2012-12-23: 30 mL via ORAL
  Filled 2012-12-23: qty 30

## 2012-12-23 NOTE — Telephone Encounter (Signed)
Called and spoke with the pt and he stated that he was seen in the ER and was told he has gallstones, and will need an referral to GI.  Pt stated that his Cardio(Dr. Dorothe Pea) who saw him asked him to call the office to see if Dr. Beverely Low will do an referral to an GI.  Pt stated that he scheduled an appt for Monday (12-26-12) with Dr. Beverely Low.  Informed the pt that I spoke with Dr. Beverely Low and she verbally stated that she looked at the ER records and we can go ahead and refer him to GI, and he will not need to come in for OV.  Pt stated that would be great.  Appt was cancelled for Monday (12-26-12).//AB/CMA

## 2012-12-23 NOTE — ED Notes (Signed)
Pt. returned from XR. 

## 2012-12-23 NOTE — ED Provider Notes (Signed)
Medical screening examination/treatment/procedure(s) were conducted as a shared visit with non-physician practitioner(s) and myself.  I personally evaluated the patient during the encounter   Pt known to me from previous visit, h/o structural heart disease, valve surgery, paroxysmal atrial fib s/p ablation twice, most recently 2 months ago.  Still having atrial fib, was woken at 0400 with upper epigastric pain and nausea, episodic lasting 15-30 seconds per episode.  No sweats, SOB.  Pt reassured that symptoms sound like spasms, whether esophageal or possibly biliary.  Will get u/s and check LFT's.  Lipase is normal.  Pt had streak for dinner last night, does take protonix daily.  No ischemic changes to ECG, troponin is negative.       Gavin Pound. Oletta Lamas, MD 12/23/12 3086

## 2012-12-23 NOTE — ED Notes (Signed)
PT. REPORTS MID CHEST PAIN ONSET 4 AM THIS MORNING WITH NAUSEA AND OCCASIONAL DRY COUGH , DENIES SOB OR DIAPHORESIS , NO MEDS PTA , HISTORY OF AORTIC VALVE DISEASE/ABLATION .

## 2012-12-23 NOTE — ED Notes (Signed)
NAD noted at time of d/c home with family 

## 2012-12-23 NOTE — ED Notes (Signed)
Pt to US.
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
## 2012-12-26 ENCOUNTER — Ambulatory Visit: Payer: No Typology Code available for payment source | Admitting: Family Medicine

## 2013-01-02 ENCOUNTER — Ambulatory Visit (INDEPENDENT_AMBULATORY_CARE_PROVIDER_SITE_OTHER): Payer: No Typology Code available for payment source | Admitting: Pharmacist

## 2013-01-02 DIAGNOSIS — Z952 Presence of prosthetic heart valve: Secondary | ICD-10-CM

## 2013-01-02 DIAGNOSIS — Z954 Presence of other heart-valve replacement: Secondary | ICD-10-CM

## 2013-01-02 DIAGNOSIS — Z7901 Long term (current) use of anticoagulants: Secondary | ICD-10-CM

## 2013-01-02 DIAGNOSIS — I4891 Unspecified atrial fibrillation: Secondary | ICD-10-CM

## 2013-01-02 LAB — POCT INR: INR: 4

## 2013-01-03 ENCOUNTER — Encounter (INDEPENDENT_AMBULATORY_CARE_PROVIDER_SITE_OTHER): Payer: Self-pay | Admitting: General Surgery

## 2013-01-03 ENCOUNTER — Ambulatory Visit (INDEPENDENT_AMBULATORY_CARE_PROVIDER_SITE_OTHER): Payer: No Typology Code available for payment source | Admitting: General Surgery

## 2013-01-03 VITALS — BP 120/92 | HR 63 | Temp 97.5°F | Ht 70.0 in | Wt 232.8 lb

## 2013-01-03 DIAGNOSIS — K8010 Calculus of gallbladder with chronic cholecystitis without obstruction: Secondary | ICD-10-CM

## 2013-01-03 MED ORDER — ENOXAPARIN SODIUM 100 MG/ML ~~LOC~~ SOLN: 100.0000 mg | Freq: Two times a day (BID) | SUBCUTANEOUS | Status: DC

## 2013-01-03 NOTE — Assessment & Plan Note (Signed)
The patient appears to have classic biliary colic with tenderness on exam indicating chronic cholecystitis.  The he will benefit from laparoscopic cholecystectomy. He is on Coumadin for a mechanical valve. I have discussed this with Dr. Gala Romney.   He is in agreement that the patient is a low risk for surgery from a cardiac standpoint. He is okay to hold his Coumadin with appropriate Lovenox bridging. He states that the Coumadin clinic can take care of this for them.  I discussed the patient directly with the Coumadin clinic as well. They would do twice a day Lovenox dosing and hold the night before and the the morning of surgery dose.   They do want him to stay overnight for observation given his previous history of atrial fibrillation.  The surgical procedure was described to the patient in detail.  The patient was given Agricultural engineer. .  I discussed the incision type and location, the location of the gallbladder, the anatomy of the bile ducts and arteries, and the typical progression of surgery.  I discussed the possibility of converting to an open operation.  I advised of the risks of bleeding, infection, damage to other structures (such as the bile duct, intestine or liver), bile leak, need for other procedures or surgeries, and post op diarrhea/constipation.  We discussed the risk of blood clot.  We discussed the recovery period and post operative restrictions.  The patient was advised against taking blood thinners the week before surgery.

## 2013-01-03 NOTE — Patient Instructions (Signed)
IF YOU ARE TAKING ASPIRIN, COUMADIN/WARFARIN, PLAVIX, OR OTHER BLOOD THINNER, PLEASE LET US KNOW IMMEDIATELY.  WE WILL NEED TO DISCUSS WITH THE PRESCRIBING PROVIDER IF THESE ARE SAFE TO STOP. IF THESE ARE NOT STOPPED AT THE APPROPRIATE TIME, THIS WILL RESULT IN A DELAY FOR YOUR SURGERY.  DO NOT TAKE THESE MEDICATIONS OR IBUPROFEN/NAPROXEN WITHIN A WEEK BEFORE SURGERY.   Usually we are able to remove the gallbladder with the laparoscopic equipment (minimally invasive).  If the anatomy is unclear or if there is severe infection or scar tissue we may need to make a larger incision to remove the gallbladder safely.  Some patients have evidence of gallstones remaining in their ducts that are not able to cleared surgically and may need ERCP (endoscopy) to remove them in the few days following surgery.     Otherwise, the main risks of surgery are bleeding, infection, damage to other structures, and hernia at the  incision sites.    These complications may lead to additional procedures such as drain placement or endoscopy, and in rare cases may lead to other surgeries.   These are not common risks, but do occur.     Most patients have some constipation in the week after surgery.  You may need over the counter stool softeners or laxatives if you experience difficulty having bowel movements.    Some patients experience diarrhea or experience a need to have a bowel movement shortly after eating.  Please discuss this with me when you come back if that occurs because you may require medication if it is severe.    If the following occur, call our office at [phone]: If you have a fever over 101 or pain that is severe despite narcotics. If you have redness or drainage at the wound. If your stools become clay-colored or you become jaundiced (yellow skin/eyes) If you develop persistent nausea or vomiting.  I will follow you back up in 3-4 weeks.    Please submit any paperwork about time off  work/insurance forms to the front desk.           
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
## 2013-01-03 NOTE — Patient Instructions (Addendum)
4/8- Take last dose of Coumadin 4/9- No Coumadin or Lovenox  4/10- Lovenox 100mg  in PM 4/11- Lovenox 100mg  in AM and PM 4/12- Lovenox 100mg  in AM and PM 4/13- Lovenox 100mg  in AM and PM 4/14- Day of Procedure (scheduled for ~12pm) When okay with MD, take Coumadin 20mg  x 2 days then resume previous dose.  Continue Lovenox 100mg  in AM and PM 4/19- Recheck INR at 12:30pm

## 2013-01-10 ENCOUNTER — Encounter (HOSPITAL_COMMUNITY): Payer: Self-pay | Admitting: Pharmacy Technician

## 2013-01-10 MED ORDER — DEXTROSE 5 % IV SOLN
2.0000 g | INTRAVENOUS | Status: AC
  Administered 2013-01-11: 2 g via INTRAVENOUS
  Filled 2013-01-10: qty 2

## 2013-01-10 NOTE — Progress Notes (Signed)
I was unable to reach patient by phone.  I left instructions on voice mail- NPO after Midnight, arive at 1145, Main entrance.  Meds to take with a sip of water: Dilazem and Tikosyn.  Do not wear lotion, powder or cologne.  Call with questions: [phone]

## 2013-01-11 ENCOUNTER — Encounter (HOSPITAL_COMMUNITY): Payer: Self-pay | Admitting: *Deleted

## 2013-01-11 ENCOUNTER — Ambulatory Visit (HOSPITAL_COMMUNITY): Payer: No Typology Code available for payment source | Admitting: Anesthesiology

## 2013-01-11 ENCOUNTER — Encounter (HOSPITAL_COMMUNITY)
Admission: RE | Disposition: A | Discharge status: Home / Self Care | Payer: Self-pay | Source: Ambulatory Visit | Attending: General Surgery

## 2013-01-11 ENCOUNTER — Encounter (HOSPITAL_COMMUNITY): Payer: Self-pay | Admitting: Anesthesiology

## 2013-01-11 ENCOUNTER — Observation Stay (HOSPITAL_COMMUNITY)
Admission: RE | Admit: 2013-01-11 | Discharge: 2013-01-12 | Disposition: A | Discharge status: Home / Self Care | Payer: No Typology Code available for payment source | Source: Ambulatory Visit | Attending: General Surgery | Admitting: General Surgery

## 2013-01-11 ENCOUNTER — Ambulatory Visit (HOSPITAL_COMMUNITY): Payer: No Typology Code available for payment source

## 2013-01-11 DIAGNOSIS — R509 Fever, unspecified: Secondary | ICD-10-CM

## 2013-01-11 DIAGNOSIS — Z7901 Long term (current) use of anticoagulants: Secondary | ICD-10-CM | POA: Insufficient documentation

## 2013-01-11 DIAGNOSIS — I4891 Unspecified atrial fibrillation: Secondary | ICD-10-CM | POA: Insufficient documentation

## 2013-01-11 DIAGNOSIS — N10 Acute tubulo-interstitial nephritis: Secondary | ICD-10-CM

## 2013-01-11 DIAGNOSIS — R454 Irritability and anger: Secondary | ICD-10-CM | POA: Insufficient documentation

## 2013-01-11 DIAGNOSIS — J309 Allergic rhinitis, unspecified: Secondary | ICD-10-CM | POA: Insufficient documentation

## 2013-01-11 DIAGNOSIS — I350 Nonrheumatic aortic (valve) stenosis: Secondary | ICD-10-CM

## 2013-01-11 DIAGNOSIS — I712 Thoracic aortic aneurysm, without rupture, unspecified: Secondary | ICD-10-CM | POA: Insufficient documentation

## 2013-01-11 DIAGNOSIS — K8010 Calculus of gallbladder with chronic cholecystitis without obstruction: Principal | ICD-10-CM | POA: Insufficient documentation

## 2013-01-11 DIAGNOSIS — M79609 Pain in unspecified limb: Secondary | ICD-10-CM

## 2013-01-11 DIAGNOSIS — G4733 Obstructive sleep apnea (adult) (pediatric): Secondary | ICD-10-CM | POA: Insufficient documentation

## 2013-01-11 DIAGNOSIS — E785 Hyperlipidemia, unspecified: Secondary | ICD-10-CM

## 2013-01-11 DIAGNOSIS — Z954 Presence of other heart-valve replacement: Secondary | ICD-10-CM | POA: Insufficient documentation

## 2013-01-11 DIAGNOSIS — H8309 Labyrinthitis, unspecified ear: Secondary | ICD-10-CM | POA: Insufficient documentation

## 2013-01-11 DIAGNOSIS — S93409A Sprain of unspecified ligament of unspecified ankle, initial encounter: Secondary | ICD-10-CM

## 2013-01-11 DIAGNOSIS — Z79899 Other long term (current) drug therapy: Secondary | ICD-10-CM | POA: Insufficient documentation

## 2013-01-11 DIAGNOSIS — Z952 Presence of prosthetic heart valve: Secondary | ICD-10-CM

## 2013-01-11 DIAGNOSIS — N529 Male erectile dysfunction, unspecified: Secondary | ICD-10-CM

## 2013-01-11 DIAGNOSIS — R079 Chest pain, unspecified: Secondary | ICD-10-CM | POA: Insufficient documentation

## 2013-01-11 DIAGNOSIS — R51 Headache: Secondary | ICD-10-CM

## 2013-01-11 DIAGNOSIS — R5383 Other fatigue: Secondary | ICD-10-CM

## 2013-01-11 DIAGNOSIS — L089 Local infection of the skin and subcutaneous tissue, unspecified: Secondary | ICD-10-CM

## 2013-01-11 DIAGNOSIS — J302 Other seasonal allergic rhinitis: Secondary | ICD-10-CM

## 2013-01-11 DIAGNOSIS — I719 Aortic aneurysm of unspecified site, without rupture: Secondary | ICD-10-CM

## 2013-01-11 DIAGNOSIS — I330 Acute and subacute infective endocarditis: Secondary | ICD-10-CM

## 2013-01-11 DIAGNOSIS — R5381 Other malaise: Secondary | ICD-10-CM | POA: Insufficient documentation

## 2013-01-11 DIAGNOSIS — R002 Palpitations: Secondary | ICD-10-CM | POA: Insufficient documentation

## 2013-01-11 HISTORY — PX: CHOLECYSTECTOMY: SHX55

## 2013-01-11 LAB — COMPREHENSIVE METABOLIC PANEL
ALT: 62 U/L — ABNORMAL HIGH (ref 0–53)
AST: 57 U/L — ABNORMAL HIGH (ref 0–37)
Albumin: 3.4 g/dL — ABNORMAL LOW (ref 3.5–5.2)
Alkaline Phosphatase: 63 U/L (ref 39–117)
BUN: 16 mg/dL (ref 6–23)
CO2: 28 mEq/L (ref 19–32)
Calcium: 9.1 mg/dL (ref 8.4–10.5)
Chloride: 106 mEq/L (ref 96–112)
Creatinine, Ser: 1.2 mg/dL (ref 0.50–1.35)
GFR calc Af Amer: 80 mL/min — ABNORMAL LOW (ref >90)
GFR calc non Af Amer: 69 mL/min — ABNORMAL LOW (ref >90)
Glucose, Bld: 87 mg/dL (ref 70–99)
Potassium: 4.3 mEq/L (ref 3.5–5.1)
Sodium: 142 mEq/L (ref 135–145)
Total Bilirubin: 1.7 mg/dL — ABNORMAL HIGH (ref 0.3–1.2)
Total Protein: 6.4 g/dL (ref 6.0–8.3)

## 2013-01-11 LAB — CBC WITH DIFFERENTIAL/PLATELET
Basophils Absolute: 0 10*3/uL (ref 0.0–0.1)
Basophils Relative: 0 % (ref 0–1)
Eosinophils Absolute: 0.1 10*3/uL (ref 0.0–0.7)
Eosinophils Relative: 2 % (ref 0–5)
HCT: 37.5 % — ABNORMAL LOW (ref 39.0–52.0)
Hemoglobin: 13.2 g/dL (ref 13.0–17.0)
Lymphocytes Relative: 34 % (ref 12–46)
Lymphs Abs: 1.8 10*3/uL (ref 0.7–4.0)
MCH: 30.6 pg (ref 26.0–34.0)
MCHC: 35.2 g/dL (ref 30.0–36.0)
MCV: 87 fL (ref 78.0–100.0)
Monocytes Absolute: 0.5 10*3/uL (ref 0.1–1.0)
Monocytes Relative: 9 % (ref 3–12)
Neutro Abs: 2.9 10*3/uL (ref 1.7–7.7)
Neutrophils Relative %: 55 % (ref 43–77)
Platelets: 151 10*3/uL (ref 150–400)
RBC: 4.31 MIL/uL (ref 4.22–5.81)
RDW: 12.5 % (ref 11.5–15.5)
WBC: 5.3 10*3/uL (ref 4.0–10.5)

## 2013-01-11 LAB — PROTIME-INR
INR: 1 (ref 0.00–1.49)
Prothrombin Time: 13.1 seconds (ref 11.6–15.2)

## 2013-01-11 LAB — SURGICAL PCR SCREEN
MRSA, PCR: POSITIVE — AB
Staphylococcus aureus: POSITIVE — AB

## 2013-01-11 SURGERY — LAPAROSCOPIC CHOLECYSTECTOMY WITH INTRAOPERATIVE CHOLANGIOGRAM
Anesthesia: General | Site: Abdomen | Wound class: Clean Contaminated

## 2013-01-11 MED ORDER — SODIUM CHLORIDE 0.9 % IV SOLN
INTRAVENOUS | Status: DC | PRN
  Administered 2013-01-11: 14:00:00

## 2013-01-11 MED ORDER — KCL IN DEXTROSE-NACL 20-5-0.45 MEQ/L-%-% IV SOLN
INTRAVENOUS | Status: DC
  Administered 2013-01-11: 20:00:00 via INTRAVENOUS
  Filled 2013-01-11 (×3): qty 1000

## 2013-01-11 MED ORDER — SODIUM CHLORIDE 0.9 % IR SOLN
Status: DC | PRN
  Administered 2013-01-11: 1000 mL

## 2013-01-11 MED ORDER — NEOSTIGMINE METHYLSULFATE 1 MG/ML IJ SOLN
INTRAMUSCULAR | Status: DC | PRN
  Administered 2013-01-11: 5 mg via INTRAVENOUS

## 2013-01-11 MED ORDER — MIDAZOLAM HCL 5 MG/5ML IJ SOLN
INTRAMUSCULAR | Status: DC | PRN
  Administered 2013-01-11: 2 mg via INTRAVENOUS

## 2013-01-11 MED ORDER — LISINOPRIL 20 MG PO TABS
20.0000 mg | ORAL_TABLET | Freq: Every day | ORAL | Status: DC
  Administered 2013-01-11: 20 mg via ORAL
  Filled 2013-01-11 (×2): qty 1

## 2013-01-11 MED ORDER — HYDROMORPHONE HCL PF 1 MG/ML IJ SOLN: 0.5000 mg | INTRAMUSCULAR | Status: DC | PRN

## 2013-01-11 MED ORDER — MUPIROCIN 2 % EX OINT
TOPICAL_OINTMENT | Freq: Two times a day (BID) | CUTANEOUS | Status: DC
  Administered 2013-01-12: 02:00:00 via NASAL
  Filled 2013-01-11 (×2): qty 22

## 2013-01-11 MED ORDER — PROMETHAZINE HCL 25 MG/ML IJ SOLN
INTRAMUSCULAR | Status: AC
  Administered 2013-01-11: 6.25 mg
  Filled 2013-01-11: qty 1

## 2013-01-11 MED ORDER — DILTIAZEM HCL ER BEADS 240 MG PO CP24: 180.0000 mg | ORAL_CAPSULE | Freq: Every day | ORAL | Status: DC | PRN

## 2013-01-11 MED ORDER — SIMVASTATIN 40 MG PO TABS
40.0000 mg | ORAL_TABLET | Freq: Every day | ORAL | Status: DC
  Administered 2013-01-11: 40 mg via ORAL
  Filled 2013-01-11 (×2): qty 1

## 2013-01-11 MED ORDER — GLYCOPYRROLATE 0.2 MG/ML IJ SOLN
INTRAMUSCULAR | Status: DC | PRN
  Administered 2013-01-11: 0.6 mg via INTRAVENOUS

## 2013-01-11 MED ORDER — ARTIFICIAL TEARS OP OINT
TOPICAL_OINTMENT | OPHTHALMIC | Status: DC | PRN
  Administered 2013-01-11: 1 via OPHTHALMIC

## 2013-01-11 MED ORDER — FENTANYL CITRATE 0.05 MG/ML IJ SOLN
INTRAMUSCULAR | Status: DC | PRN
  Administered 2013-01-11 (×2): 50 ug via INTRAVENOUS

## 2013-01-11 MED ORDER — ONDANSETRON HCL 4 MG/2ML IJ SOLN
INTRAMUSCULAR | Status: DC | PRN
  Administered 2013-01-11: 4 mg via INTRAVENOUS

## 2013-01-11 MED ORDER — HYDROCODONE-ACETAMINOPHEN 5-325 MG PO TABS
1.0000 | ORAL_TABLET | ORAL | Status: DC | PRN
  Administered 2013-01-11 (×2): 2 via ORAL
  Filled 2013-01-11 (×2): qty 2

## 2013-01-11 MED ORDER — LIDOCAINE HCL 1 % IJ SOLN
INTRAMUSCULAR | Status: DC | PRN
  Administered 2013-01-11: 14:00:00 via INTRAMUSCULAR

## 2013-01-11 MED ORDER — CHLORHEXIDINE GLUCONATE CLOTH 2 % EX PADS
6.0000 | MEDICATED_PAD | Freq: Every day | CUTANEOUS | Status: DC
  Administered 2013-01-12: 6 via TOPICAL

## 2013-01-11 MED ORDER — PANTOPRAZOLE SODIUM 40 MG PO TBEC
40.0000 mg | DELAYED_RELEASE_TABLET | Freq: Every day | ORAL | Status: DC
  Administered 2013-01-11: 40 mg via ORAL
  Filled 2013-01-11: qty 1

## 2013-01-11 MED ORDER — LACTATED RINGERS IV SOLN
INTRAVENOUS | Status: DC
  Administered 2013-01-11: 14:00:00 via INTRAVENOUS

## 2013-01-11 MED ORDER — ONDANSETRON HCL 4 MG/2ML IJ SOLN: 4.0000 mg | Freq: Four times a day (QID) | INTRAMUSCULAR | Status: DC | PRN

## 2013-01-11 MED ORDER — HYDROMORPHONE HCL PF 1 MG/ML IJ SOLN
0.2500 mg | INTRAMUSCULAR | Status: DC | PRN
  Administered 2013-01-11 (×2): 0.5 mg via INTRAVENOUS

## 2013-01-11 MED ORDER — IBUPROFEN 600 MG PO TABS: 600.0000 mg | ORAL_TABLET | Freq: Four times a day (QID) | ORAL | Status: DC | PRN

## 2013-01-11 MED ORDER — HYDROMORPHONE HCL PF 1 MG/ML IJ SOLN
INTRAMUSCULAR | Status: AC
  Filled 2013-01-11: qty 1

## 2013-01-11 MED ORDER — ENOXAPARIN SODIUM 100 MG/ML ~~LOC~~ SOLN
100.0000 mg | Freq: Two times a day (BID) | SUBCUTANEOUS | Status: DC
  Administered 2013-01-11: 100 mg via SUBCUTANEOUS
  Filled 2013-01-11 (×5): qty 1

## 2013-01-11 MED ORDER — PROPOFOL 10 MG/ML IV BOLUS
INTRAVENOUS | Status: DC | PRN
  Administered 2013-01-11: 200 mg via INTRAVENOUS

## 2013-01-11 MED ORDER — DEXTROSE 5 % IV SOLN
1.0000 g | Freq: Four times a day (QID) | INTRAVENOUS | Status: AC
  Administered 2013-01-11 – 2013-01-12 (×3): 1 g via INTRAVENOUS
  Filled 2013-01-11 (×4): qty 1

## 2013-01-11 MED ORDER — ONDANSETRON HCL 4 MG PO TABS: 4.0000 mg | ORAL_TABLET | Freq: Four times a day (QID) | ORAL | Status: DC | PRN

## 2013-01-11 MED ORDER — LIDOCAINE HCL (PF) 1 % IJ SOLN
INTRAMUSCULAR | Status: AC
  Filled 2013-01-11: qty 30

## 2013-01-11 MED ORDER — BUPIVACAINE-EPINEPHRINE PF 0.25-1:200000 % IJ SOLN
INTRAMUSCULAR | Status: AC
  Filled 2013-01-11: qty 30

## 2013-01-11 MED ORDER — MUPIROCIN 2 % EX OINT
TOPICAL_OINTMENT | CUTANEOUS | Status: AC
  Filled 2013-01-11: qty 22

## 2013-01-11 MED ORDER — ROCURONIUM BROMIDE 100 MG/10ML IV SOLN
INTRAVENOUS | Status: DC | PRN
  Administered 2013-01-11: 50 mg via INTRAVENOUS

## 2013-01-11 MED ORDER — ACETAMINOPHEN 10 MG/ML IV SOLN
1000.0000 mg | Freq: Four times a day (QID) | INTRAVENOUS | Status: DC
  Administered 2013-01-11 – 2013-01-12 (×2): 1000 mg via INTRAVENOUS
  Filled 2013-01-11 (×3): qty 100

## 2013-01-11 MED ORDER — DOFETILIDE 500 MCG PO CAPS
500.0000 ug | ORAL_CAPSULE | Freq: Two times a day (BID) | ORAL | Status: DC
  Administered 2013-01-11: 500 ug via ORAL
  Filled 2013-01-11 (×3): qty 1

## 2013-01-11 MED ORDER — 0.9 % SODIUM CHLORIDE (POUR BTL) OPTIME
TOPICAL | Status: DC | PRN
  Administered 2013-01-11: 1000 mL

## 2013-01-11 MED ORDER — LACTATED RINGERS IV SOLN
INTRAVENOUS | Status: DC | PRN
  Administered 2013-01-11 (×2): via INTRAVENOUS

## 2013-01-11 MED ORDER — MUPIROCIN 2 % EX OINT: TOPICAL_OINTMENT | Freq: Two times a day (BID) | CUTANEOUS | Status: DC

## 2013-01-11 SURGICAL SUPPLY — 49 items
ADH SKN CLS APL DERMABOND .7 (GAUZE/BANDAGES/DRESSINGS) ×1
APPLIER CLIP ROT 10 11.4 M/L (STAPLE) ×2
APR CLP MED LRG 11.4X10 (STAPLE) ×1
BAG SPEC RTRVL LRG 6X4 10 (ENDOMECHANICALS) ×1
BLADE SURG ROTATE 9660 (MISCELLANEOUS) IMPLANT
CANISTER SUCTION 2500CC (MISCELLANEOUS) ×2 IMPLANT
CHLORAPREP W/TINT 26ML (MISCELLANEOUS) ×2 IMPLANT
CLIP APPLIE ROT 10 11.4 M/L (STAPLE) ×1 IMPLANT
CLOTH BEACON ORANGE TIMEOUT ST (SAFETY) ×2 IMPLANT
COVER MAYO STAND STRL (DRAPES) IMPLANT
COVER SURGICAL LIGHT HANDLE (MISCELLANEOUS) ×2 IMPLANT
DECANTER SPIKE VIAL GLASS SM (MISCELLANEOUS) ×4 IMPLANT
DERMABOND ADVANCED (GAUZE/BANDAGES/DRESSINGS) ×1
DERMABOND ADVANCED .7 DNX12 (GAUZE/BANDAGES/DRESSINGS) ×1 IMPLANT
DRAPE C-ARM 42X72 X-RAY (DRAPES) IMPLANT
DRAPE UTILITY 15X26 W/TAPE STR (DRAPE) ×4 IMPLANT
DRAPE WARM FLUID 44X44 (DRAPE) ×2 IMPLANT
ELECT REM PT RETURN 9FT ADLT (ELECTROSURGICAL) ×2
ELECTRODE REM PT RTRN 9FT ADLT (ELECTROSURGICAL) ×1 IMPLANT
FILTER SMOKE EVAC LAPAROSHD (FILTER) IMPLANT
GLOVE BIO SURGEON STRL SZ 6 (GLOVE) ×2 IMPLANT
GLOVE BIO SURGEON STRL SZ7.5 (GLOVE) ×2 IMPLANT
GLOVE BIOGEL PI IND STRL 6.5 (GLOVE) ×1 IMPLANT
GLOVE BIOGEL PI IND STRL 7.0 (GLOVE) IMPLANT
GLOVE BIOGEL PI IND STRL 7.5 (GLOVE) IMPLANT
GLOVE BIOGEL PI INDICATOR 6.5 (GLOVE) ×1
GLOVE BIOGEL PI INDICATOR 7.0 (GLOVE) ×1
GLOVE BIOGEL PI INDICATOR 7.5 (GLOVE) ×1
GLOVE ECLIPSE 8.0 STRL XLNG CF (GLOVE) ×1 IMPLANT
GLOVE SURG SS PI 7.0 STRL IVOR (GLOVE) ×1 IMPLANT
GOWN PREVENTION PLUS XXLARGE (GOWN DISPOSABLE) ×2 IMPLANT
GOWN STRL NON-REIN LRG LVL3 (GOWN DISPOSABLE) ×6 IMPLANT
KIT BASIN OR (CUSTOM PROCEDURE TRAY) ×2 IMPLANT
KIT ROOM TURNOVER OR (KITS) ×2 IMPLANT
NS IRRIG 1000ML POUR BTL (IV SOLUTION) ×2 IMPLANT
PAD ARMBOARD 7.5X6 YLW CONV (MISCELLANEOUS) ×2 IMPLANT
POUCH SPECIMEN RETRIEVAL 10MM (ENDOMECHANICALS) ×2 IMPLANT
SCISSORS LAP 5X35 DISP (ENDOMECHANICALS) IMPLANT
SET CHOLANGIOGRAPH 5 50 .035 (SET/KITS/TRAYS/PACK) IMPLANT
SET IRRIG TUBING LAPAROSCOPIC (IRRIGATION / IRRIGATOR) ×2 IMPLANT
SLEEVE ENDOPATH XCEL 5M (ENDOMECHANICALS) ×2 IMPLANT
SPECIMEN JAR SMALL (MISCELLANEOUS) ×2 IMPLANT
SUT MNCRL AB 4-0 PS2 18 (SUTURE) ×2 IMPLANT
TOWEL OR 17X24 6PK STRL BLUE (TOWEL DISPOSABLE) ×2 IMPLANT
TOWEL OR 17X26 10 PK STRL BLUE (TOWEL DISPOSABLE) ×2 IMPLANT
TRAY LAPAROSCOPIC (CUSTOM PROCEDURE TRAY) ×2 IMPLANT
TROCAR XCEL BLUNT TIP 100MML (ENDOMECHANICALS) ×2 IMPLANT
TROCAR XCEL NON-BLD 11X100MML (ENDOMECHANICALS) ×2 IMPLANT
TROCAR XCEL NON-BLD 5MMX100MML (ENDOMECHANICALS) ×2 IMPLANT

## 2013-01-11 NOTE — Op Note (Signed)
Laparoscopic Cholecystectomy with IOC Procedure Note  Indications: This patient presents with chronic cholecystitis and cholelithiasis and will undergo laparoscopic cholecystectomy.  Pre-operative Diagnosis: see above  Post-operative Diagnosis: Same  Surgeon: Almond Lint   Assistants: Chevis Pretty   Anesthesia: General endotracheal anesthesia and local  ASA Class: 3  Procedure Details  The patient was seen again in the Holding Room. The risks, benefits, complications, treatment options, and expected outcomes were discussed with the patient. The possibilities of  bleeding, recurrent infection, damage to nearby structures, the need for additional procedures, failure to diagnose a condition, the possible need to convert to an open procedure, and creating a complication requiring transfusion or operation were discussed with the patient. The likelihood of improving the patient's symptoms with return to their baseline status is good.    The patient and/or family concurred with the proposed plan, giving informed consent. The site of surgery properly noted. The patient was taken to Operating Room, and the procedure verified as Laparoscopic Cholecystectomy with Intraoperative Cholangiogram. A Time Out was held and the above information confirmed.  Prior to the induction of general anesthesia, antibiotic prophylaxis was administered. General endotracheal anesthesia was then administered and tolerated well. After the induction, the abdomen was prepped with Chloraprep and draped in the sterile fashion. The patient was positioned in the supine position.  Local anesthetic agent was injected into the skin near the umbilicus and an incision made. We dissected down to the abdominal fascia with blunt dissection.  The fascia was incised vertically and we entered the peritoneal cavity bluntly.  A pursestring suture of 0-Vicryl was placed around the fascial opening.  The Hasson cannula was inserted and secured with  the stay suture.  Pneumoperitoneum was then created with CO2 and tolerated well without any adverse changes in the patient's vital signs. An 11-mm port was placed in the subxiphoid position.  Two 5-mm ports were placed in the right upper quadrant. All skin incisions were infiltrated with a local anesthetic agent before making the incision and placing the trocars.   We positioned the patient in reverse Trendelenburg, tilted slightly to the patient's left.  The gallbladder was identified, the fundus grasped and retracted cephalad. Adhesions were lysed bluntly and with the electrocautery where indicated, taking care not to injure any adjacent organs or viscus. The infundibulum was grasped and retracted laterally, exposing the peritoneum overlying the triangle of Calot. This was then divided and exposed in a blunt fashion. A critical view of the cystic duct and cystic artery was obtained.  The cystic duct was clearly identified and bluntly dissected circumferentially. The cystic duct was ligated with a clip distally.   An incision was made in the cystic duct and the [facility] cholangiogram catheter introduced. The catheter was secured using a clip. A cholangiogram was then performed, demonstrating good filling of right, left, and common ducts with drainage into duodenum with no filling defects.  .  The cystic duct was then ligated with clips and divided. The cystic artery was identified, dissected free, ligated with clips and divided as well.   The gallbladder was dissected from the liver bed in retrograde fashion with the electrocautery. The gallbladder was removed and placed in an Endocatch bag.  The gallbladder and Endocatch bag were then removed through the umbilical port site.  The liver bed was irrigated and inspected. Hemostasis was achieved with the electrocautery. Copious irrigation was utilized and was repeatedly aspirated until clear.    We again inspected the right upper quadrant for hemostasis.  Pneumoperitoneum was released as we removed the trocars.   The pursestring suture was used to close the umbilical fascia.  4-0 Monocryl was used to close the skin.   The skin was cleaned and dry, and Dermabond was applied. The patient was then extubated and brought to the recovery room in stable condition. Instrument, sponge, and needle counts were correct at closure and at the conclusion of the case.   Findings: Chronic inflammation and stones.    Estimated Blood Loss: min         Drains: none          Specimens: Gallbladder to pathology       Complications: None; patient tolerated the procedure well.         Disposition: PACU - hemodynamically stable.         Condition: stable
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
## 2013-01-11 NOTE — Progress Notes (Signed)
Report given to elise rn as caregiver 

## 2013-01-11 NOTE — Transfer of Care (Signed)
Immediate Anesthesia Transfer of Care Note  Patient: Mark [name]  Procedure(s) Performed: Procedure(s): LAPAROSCOPIC CHOLECYSTECTOMY WITH INTRAOPERATIVE CHOLANGIOGRAM (N/A)  Patient Location: PACU  Anesthesia Type:General  Level of Consciousness: awake, alert , oriented and sedated  Airway & Oxygen Therapy: Patient Spontanous Breathing and Patient connected to face mask oxygen  Post-op Assessment: Report given to PACU RN, Post -op Vital signs reviewed and stable and Patient moving all extremities  Post vital signs: Reviewed and stable  Complications: No apparent anesthesia complications

## 2013-01-11 NOTE — Interval H&P Note (Signed)
History and Physical Interval Note:  01/11/2013 2:22 PM  Mark [name]  has presented today for surgery, with the diagnosis of chronic cholecystitis with cholelithiasis   The various methods of treatment have been discussed with the patient and family. After consideration of risks, benefits and other options for treatment, the patient has consented to  Procedure(s): LAPAROSCOPIC CHOLECYSTECTOMY WITH INTRAOPERATIVE CHOLANGIOGRAM (N/A) as a surgical intervention .  The patient's history has been reviewed, patient examined, no change in status, stable for surgery.  I have reviewed the patient's chart and labs.  Questions were answered to the patient's satisfaction.     [name]

## 2013-01-11 NOTE — Anesthesia Postprocedure Evaluation (Signed)
Anesthesia Post Note  Patient: Mark [name]  Procedure(s) Performed: Procedure(s) (LRB): LAPAROSCOPIC CHOLECYSTECTOMY WITH INTRAOPERATIVE CHOLANGIOGRAM (N/A)  Anesthesia type: general  Patient location: PACU  Post pain: Pain level controlled  Post assessment: Patient's Cardiovascular Status Stable  Last Vitals:  Filed Vitals:   01/11/13 1730  BP: 134/78  Pulse: 56  Temp:   Resp: 15    Post vital signs: Reviewed and stable  Level of consciousness: sedated  Complications: No apparent anesthesia complications

## 2013-01-11 NOTE — Progress Notes (Signed)
Report rec'd from [facility], assumed care of patient at this time

## 2013-01-11 NOTE — Anesthesia Preprocedure Evaluation (Signed)
Anesthesia Evaluation  Patient identified by MRN, date of birth, ID band Patient awake    Reviewed: Allergy & Precautions, H&P , NPO status , Patient's Chart, lab work & pertinent test results  Airway Mallampati: II TM Distance: >3 FB Neck ROM: Full    Dental no notable dental hx. (+) Teeth Intact and Dental Advisory Given   Pulmonary sleep apnea ,  breath sounds clear to auscultation  Pulmonary exam normal       Cardiovascular hypertension, On Medications + Peripheral Vascular Disease - CAD + dysrhythmias Atrial Fibrillation + Valvular Problems/Murmurs Rhythm:Regular Rate:Normal  H/o AVR   Neuro/Psych  Headaches, PSYCHIATRIC DISORDERS    GI/Hepatic negative GI ROS, (+) Hepatitis -, A  Endo/Other  negative endocrine ROS  Renal/GU negative Renal ROS  negative genitourinary   Musculoskeletal   Abdominal   Peds  Hematology negative hematology ROS (+)   Anesthesia Other Findings   Reproductive/Obstetrics negative OB ROS                           Anesthesia Physical Anesthesia Plan  ASA: III  Anesthesia Plan: General   Post-op Pain Management:    Induction: Intravenous  Airway Management Planned: Oral ETT  Additional Equipment:   Intra-op Plan:   Post-operative Plan: Extubation in OR  Informed Consent: I have reviewed the patients History and Physical, chart, labs and discussed the procedure including the risks, benefits and alternatives for the proposed anesthesia with the patient or authorized representative who has indicated his/her understanding and acceptance.   Dental advisory given  Plan Discussed with: CRNA  Anesthesia Plan Comments:         Anesthesia Quick Evaluation

## 2013-01-12 ENCOUNTER — Other Ambulatory Visit (INDEPENDENT_AMBULATORY_CARE_PROVIDER_SITE_OTHER): Payer: Self-pay | Admitting: General Surgery

## 2013-01-12 DIAGNOSIS — K8020 Calculus of gallbladder without cholecystitis without obstruction: Secondary | ICD-10-CM

## 2013-01-12 LAB — COMPREHENSIVE METABOLIC PANEL
ALT: 153 U/L — ABNORMAL HIGH (ref 0–53)
AST: 122 U/L — ABNORMAL HIGH (ref 0–37)
Albumin: 3.2 g/dL — ABNORMAL LOW (ref 3.5–5.2)
Alkaline Phosphatase: 61 U/L (ref 39–117)
BUN: 15 mg/dL (ref 6–23)
CO2: 27 mEq/L (ref 19–32)
Calcium: 8.5 mg/dL (ref 8.4–10.5)
Chloride: 104 mEq/L (ref 96–112)
Creatinine, Ser: 1.03 mg/dL (ref 0.50–1.35)
GFR calc Af Amer: >90 mL/min (ref >90)
GFR calc non Af Amer: 84 mL/min — ABNORMAL LOW (ref >90)
Glucose, Bld: 103 mg/dL — ABNORMAL HIGH (ref 70–99)
Potassium: 3.9 mEq/L (ref 3.5–5.1)
Sodium: 138 mEq/L (ref 135–145)
Total Bilirubin: 1.7 mg/dL — ABNORMAL HIGH (ref 0.3–1.2)
Total Protein: 5.5 g/dL — ABNORMAL LOW (ref 6.0–8.3)

## 2013-01-12 LAB — CBC
HCT: 34.8 % — ABNORMAL LOW (ref 39.0–52.0)
Hemoglobin: 12 g/dL — ABNORMAL LOW (ref 13.0–17.0)
MCH: 30.5 pg (ref 26.0–34.0)
MCHC: 34.5 g/dL (ref 30.0–36.0)
MCV: 88.5 fL (ref 78.0–100.0)
Platelets: 135 10*3/uL — ABNORMAL LOW (ref 150–400)
RBC: 3.93 MIL/uL — ABNORMAL LOW (ref 4.22–5.81)
RDW: 12.7 % (ref 11.5–15.5)
WBC: 6.9 10*3/uL (ref 4.0–10.5)

## 2013-01-12 MED ORDER — DIPHENHYDRAMINE HCL 50 MG/ML IJ SOLN
12.5000 mg | INTRAMUSCULAR | Status: DC | PRN
  Administered 2013-01-12: 12.5 mg via INTRAVENOUS
  Filled 2013-01-12: qty 1

## 2013-01-12 MED ORDER — HYDROCODONE-ACETAMINOPHEN 5-325 MG PO TABS: 1.0000 | ORAL_TABLET | ORAL | Status: DC | PRN

## 2013-01-12 NOTE — Discharge Summary (Signed)
Physician Discharge Summary  Patient ID: Mark [name] MRN: [id number] DOB/AGE: [birth year] 50 y.o.  Admit date: 01/11/2013 Discharge date: 01/12/2013  Admission Diagnoses: Chronic cholecystitis and cholelithiasis - principle diagnosis. Patient Active Problem List   Diagnosis Date Noted  . Chronic cholecystitis with calculus 01/03/2013    Priority: High  . Labyrinthitis 09/13/2012  . Irritability and anger 09/13/2012  . Chest pain 05/27/2012  . Sleep apnea, obstructive 03/16/2012  . Seasonal allergic rhinitis 12/25/2011  . Fatigue 12/24/2011  . Aneurysm of aorta 09/28/2011  . Aneurysm of ascending aorta 09/28/2011  . Palpitations 09/18/2011  . Finger infection 08/13/2011  . Encounter for long-term (current) use of anticoagulants 12/11/2010  . Hyperlipidemia 12/11/2010  . Aortic stenosis   . 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 Atrial fibrillation 05/21/2010  . ANKLE SPRAIN, LEFT 04/28/2010  . PYELONEPHRITIS, ACUTE 04/18/2010  . HEADACHE 04/18/2010  . 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 CALF PAIN, RIGHT 03/14/2009  . ENDOCARDITIS, BACTERIAL, SUBACUTE 11/21/2008  . FEVER UNSPECIFIED 11/21/2008  . HEMOPTYSIS 11/21/2008  . AORTIC VALVE REPLACEMENT, HX OF 11/21/2008     Discharge Diagnoses:  Same as above  Discharged Condition: stable  Hospital Course:  Pt admitted to the floor following lap chole with normal cholangiogram.  He was placed on telemetry due to his past history of mechanical valve and atrial fibrillation.  He was given therapeutic lovenox last night and this morning and was still stable.  He was able to tolerate a diet.  He had minimal pain/soreness.  He was discharged to home in stable condition.  He is to start warfarin back tonight.  He has appt in coumadin clinic Monday for PT/INR evaluation.  He will need repeat LFTs next week.    Consults: None  Significant Diagnostic Studies: labs: HCT 34.9  Treatments: surgery: see above  Discharge  Exam: Blood pressure 100/64, pulse 73, temperature 97.9 F (36.6 C), temperature source Oral, resp. rate 18, height 5\' 11"  (1.803 m), weight 230 lb 13.2 oz (104.7 kg), SpO2 100.00%. General appearance: alert, cooperative and no distress Resp: breathing comfortably GI: soft, approp tender, non distended. Extremities: extremities normal, atraumatic, no cyanosis or edema  Disposition: 01-Home or Self Care  Discharge Orders   Future Appointments Provider Department Dept Phone   01/16/2013 12:30 PM [facility] [phone]   01/27/2013 12:15 PM Almond Lint, MD [facility] [place] [phone]   02/09/2013 8:45 AM Hillis Range, MD [facility] Main Office [place]) [phone]   Future Orders Complete By Expires     Call MD for:  difficulty breathing, headache or visual disturbances  As directed     Call MD for:  persistant nausea and vomiting  As directed     Call MD for:  redness, tenderness, or signs of infection (pain, swelling, redness, odor or green/yellow discharge around incision site)  As directed     Call MD for:  severe uncontrolled pain  As directed     Call MD for:  temperature >100.4  As directed     Diet - low sodium heart healthy  As directed     Increase activity slowly  As directed         Medication List    TAKE these medications       aspirin EC 81 MG tablet  Take 81 mg by mouth every evening.     diltiazem 180 MG 24 hr capsule  Commonly known as:  TIAZAC  Take 1 capsule (180 mg  total) by mouth daily as needed (recurrent atrial fibrillation).     dofetilide 500 MCG capsule  Commonly known as:  TIKOSYN  Take 1 capsule (500 mcg total) by mouth every 12 (twelve) hours.     enoxaparin 100 MG/ML injection  Commonly known as:  LOVENOX  Inject 1 mL (100 mg total) into the skin every 12 (twelve) hours.     HYDROcodone-acetaminophen 5-325 MG per tablet  Commonly known as:  NORCO/VICODIN  Take 1-2 tablets  by mouth every 4 (four) hours as needed.     ibuprofen 200 MG tablet  Commonly known as:  ADVIL,MOTRIN  Take 600 mg by mouth daily as needed for pain.     lisinopril 20 MG tablet  Commonly known as:  PRINIVIL,ZESTRIL  Take 1 tablet (20 mg total) by mouth at bedtime.     pantoprazole 40 MG tablet  Commonly known as:  PROTONIX  Take 1 tablet (40 mg total) by mouth at bedtime.     simvastatin 40 MG tablet  Commonly known as:  ZOCOR  Take 40 mg by mouth at bedtime.     warfarin 10 MG tablet  Commonly known as:  COUMADIN  Take 10-15 mg by mouth every evening. Take 1 1/2 tablets (15 mg) on Tuesday and Thursday, and 1 tablet (10 mg) on all other days           Follow-up Information   Follow up with [facility], [place]. Schedule an appointment as soon as possible for a visit in 3 weeks.   Contact information:   [address] [place] [address] [phone]       Signed: Almond Lint 01/12/2013, 7:49 AM

## 2013-01-12 NOTE — Progress Notes (Signed)
Patient discharged to home with instructions. 

## 2013-01-13 ENCOUNTER — Encounter (HOSPITAL_COMMUNITY): Payer: Self-pay | Admitting: General Surgery

## 2013-01-16 ENCOUNTER — Ambulatory Visit (INDEPENDENT_AMBULATORY_CARE_PROVIDER_SITE_OTHER): Payer: No Typology Code available for payment source

## 2013-01-16 DIAGNOSIS — Z7901 Long term (current) use of anticoagulants: Secondary | ICD-10-CM

## 2013-01-16 DIAGNOSIS — Z952 Presence of prosthetic heart valve: Secondary | ICD-10-CM

## 2013-01-16 DIAGNOSIS — Z954 Presence of other heart-valve replacement: Secondary | ICD-10-CM

## 2013-01-16 DIAGNOSIS — I4891 Unspecified atrial fibrillation: Secondary | ICD-10-CM

## 2013-01-16 LAB — POCT INR: INR: 2.3

## 2013-01-16 LAB — COMPREHENSIVE METABOLIC PANEL
ALT: 185 U/L — ABNORMAL HIGH (ref 0–53)
AST: 93 U/L — ABNORMAL HIGH (ref 0–37)
Albumin: 3.9 g/dL (ref 3.5–5.2)
Alkaline Phosphatase: 66 U/L (ref 39–117)
BUN: 18 mg/dL (ref 6–23)
CO2: 25 mEq/L (ref 19–32)
Calcium: 9.3 mg/dL (ref 8.4–10.5)
Chloride: 105 mEq/L (ref 96–112)
Creatinine, Ser: 1.2 mg/dL (ref 0.4–1.5)
GFR: 67.58 mL/min (ref >60.00)
Glucose, Bld: 89 mg/dL (ref 70–99)
Potassium: 4.5 mEq/L (ref 3.5–5.1)
Sodium: 141 mEq/L (ref 135–145)
Total Bilirubin: 0.5 mg/dL (ref 0.3–1.2)
Total Protein: 7.2 g/dL (ref 6.0–8.3)

## 2013-01-16 LAB — CBC
HCT: 38.8 % — ABNORMAL LOW (ref 39.0–52.0)
Hemoglobin: 13.5 g/dL (ref 13.0–17.0)
MCHC: 34.8 g/dL (ref 30.0–36.0)
MCV: 88.6 fl (ref 78.0–100.0)
Platelets: 160 10*3/uL (ref 150.0–400.0)
RBC: 4.38 Mil/uL (ref 4.22–5.81)
RDW: 12.8 % (ref 11.5–14.6)
WBC: 5.9 10*3/uL (ref 4.5–10.5)

## 2013-01-19 ENCOUNTER — Ambulatory Visit (INDEPENDENT_AMBULATORY_CARE_PROVIDER_SITE_OTHER): Payer: No Typology Code available for payment source | Admitting: *Deleted

## 2013-01-19 DIAGNOSIS — Z952 Presence of prosthetic heart valve: Secondary | ICD-10-CM

## 2013-01-19 DIAGNOSIS — I4891 Unspecified atrial fibrillation: Secondary | ICD-10-CM

## 2013-01-19 DIAGNOSIS — Z954 Presence of other heart-valve replacement: Secondary | ICD-10-CM

## 2013-01-19 DIAGNOSIS — Z7901 Long term (current) use of anticoagulants: Secondary | ICD-10-CM

## 2013-01-19 LAB — POCT INR: INR: 3.8

## 2013-01-27 ENCOUNTER — Encounter (INDEPENDENT_AMBULATORY_CARE_PROVIDER_SITE_OTHER): Payer: Self-pay | Admitting: General Surgery

## 2013-01-27 ENCOUNTER — Ambulatory Visit (INDEPENDENT_AMBULATORY_CARE_PROVIDER_SITE_OTHER): Payer: No Typology Code available for payment source | Admitting: General Surgery

## 2013-01-27 VITALS — BP 112/78 | HR 60 | Temp 98.4°F | Resp 14 | Ht 70.0 in | Wt 234.8 lb

## 2013-01-27 DIAGNOSIS — K8010 Calculus of gallbladder with chronic cholecystitis without obstruction: Secondary | ICD-10-CM

## 2013-01-27 NOTE — Patient Instructions (Signed)
Call if problems arise.  Follow up as needed.

## 2013-01-27 NOTE — Progress Notes (Signed)
HISTORY: Pt doing well s/p lap chole.  He is on coumadin for mechanical valve.  He did get a small hematoma around his umbilical site.  He was sore for several days.  He is off pain medication.  He is not having any nausea or vomiting.  He denies diarrhea.  He is not having fevers/ chills.      EXAM: General:  Alert and oriented Incision:  Healing well overall.  Small hematoma at umbilical site, resolving.     PATHOLOGY: Chronic cholecystitis with calculus.     ASSESSMENT AND PLAN:   Chronic cholecystitis with calculus Pt without evidence of surgical complications.  Follow up as needed.        Maudry Diego, MD Surgical Oncology, General & Endocrine Surgery [facility]  Neena Rhymes, MD Sheliah Hatch, MD

## 2013-01-27 NOTE — Assessment & Plan Note (Signed)
Pt without evidence of surgical complications.  Follow up as needed.

## 2013-01-31 ENCOUNTER — Telehealth: Payer: Self-pay | Admitting: Internal Medicine

## 2013-01-31 NOTE — Telephone Encounter (Signed)
Pt called wanting to be sure that his aunt Mrs Rosilyn Mings came for her appt and information given regarding pts dose and that she was given script to have INR done in [place] in 2 weeks and pt was instructed to call with contact number in [place] and Mr Cordner states he will check with her to be sure she takes correct dose of coumadin and will call with contact number in [place] .

## 2013-01-31 NOTE — Telephone Encounter (Signed)
Follow Up      Pt calling in returning call from earlier. Please call back,

## 2013-02-02 ENCOUNTER — Ambulatory Visit (INDEPENDENT_AMBULATORY_CARE_PROVIDER_SITE_OTHER): Payer: No Typology Code available for payment source | Admitting: *Deleted

## 2013-02-02 DIAGNOSIS — Z7901 Long term (current) use of anticoagulants: Secondary | ICD-10-CM

## 2013-02-02 DIAGNOSIS — I4891 Unspecified atrial fibrillation: Secondary | ICD-10-CM

## 2013-02-02 DIAGNOSIS — Z952 Presence of prosthetic heart valve: Secondary | ICD-10-CM

## 2013-02-02 DIAGNOSIS — Z954 Presence of other heart-valve replacement: Secondary | ICD-10-CM

## 2013-02-02 LAB — POCT INR: INR: 3.5

## 2013-02-03 ENCOUNTER — Telehealth: Payer: Self-pay | Admitting: Family Medicine

## 2013-02-03 NOTE — Telephone Encounter (Signed)
Pt was advised because of the time to seek treatment at [facility] if symptoms worsen. Pt was given the number for [facility] to make an appt for tomorrow.

## 2013-02-03 NOTE — Telephone Encounter (Signed)
Patient Information:  Caller Name: Veniamin  Phone: [phone]  Patient: Mark, [name]  Gender: Male  DOB: 03-May-1961  Age: 53 Years  PCP: Sheliah Hatch.  Office Follow Up:  Does the office need to follow up with this patient?: Yes  Instructions For The Office: Per disposition - "Callback by PCP today."   Please see symptoms and follow up with pt.   Symptoms  Reason For Call & Symptoms: 02/03/13 nauseated, diarrhea x3, lightheaded earlier in day but better now.  Feels like possible fever, but hasn't been able check temp.   Feels like "poop" .  On antibiotics while gallbladder surgery 3 weeks ago.   Pt's grandson has just been diagnosed with hand foot and mouth and pt is concerned if his sxs may be from virus.  Reviewed Health History In EMR: Yes  Reviewed Medications In EMR: Yes  Reviewed Allergies In EMR: Yes  Reviewed Surgeries / Procedures: Yes  Date of Onset of Symptoms: 02/03/2013  Guideline(s) Used:  Diarrhea  Disposition Per Guideline:   Callback by PCP Today  Reason For Disposition Reached:   Recent antibiotic therapy (i.e., within last 2 months)  Advice Given:  Fluids:  Drink more fluids, at least 8-10 glasses (8 oz or 240 ml) daily.  Avoid caffeinated beverages (Reason: caffeine is mildly dehydrating).  Nutrition:  Maintaining some food intake during episodes of diarrhea is important.  Ideal initial foods include boiled starches/cereals (e.g., potatoes, rice, noodles, wheat, oats) with a small amount of salt to taste.  Other acceptable foods include: bananas, yogurt, crackers, soup.  Call Back If:  Signs of dehydration occur (e.g., no urine for more than 12 hours, very dry mouth, lightheaded, etc.)  Diarrhea lasts over 7 days  You become worse.  Patient Will Follow Care Advice:  YES

## 2013-02-06 ENCOUNTER — Other Ambulatory Visit (HOSPITAL_COMMUNITY): Payer: Self-pay | Admitting: *Deleted

## 2013-02-06 MED ORDER — DOFETILIDE 500 MCG PO CAPS: 500.0000 ug | ORAL_CAPSULE | Freq: Two times a day (BID) | ORAL | Status: DC

## 2013-02-07 ENCOUNTER — Encounter: Payer: Self-pay | Admitting: Family Medicine

## 2013-02-07 ENCOUNTER — Ambulatory Visit (INDEPENDENT_AMBULATORY_CARE_PROVIDER_SITE_OTHER): Payer: No Typology Code available for payment source | Admitting: Family Medicine

## 2013-02-07 VITALS — BP 110/80 | HR 69 | Temp 98.7°F | Ht 70.75 in | Wt 232.4 lb

## 2013-02-07 DIAGNOSIS — H8302 Labyrinthitis, left ear: Secondary | ICD-10-CM

## 2013-02-07 DIAGNOSIS — H8309 Labyrinthitis, unspecified ear: Secondary | ICD-10-CM

## 2013-02-07 DIAGNOSIS — T819XXA Unspecified complication of procedure, initial encounter: Secondary | ICD-10-CM | POA: Insufficient documentation

## 2013-02-07 MED ORDER — MECLIZINE HCL 50 MG PO TABS: 50.0000 mg | ORAL_TABLET | Freq: Three times a day (TID) | ORAL | Status: DC | PRN

## 2013-02-07 NOTE — Progress Notes (Signed)
  Subjective:    Patient ID: Mark [name], male    DOB: [birth year], 52 y.o.   MRN: [id number]  HPI Ear pain- L sided, sxs started Friday.  Got very dizzy while driving- 'had to turn my head to see straight'.  Dizziness and light headed feelings improved w/ meclizine over the weekend but sxs returned this AM.  + nausea, no vomiting.  sxs are worse 1st thing in the AM.  abd incision- pt had laparoscopic cholecystectomy and now w/ bulging, discolored umbilical incision.  Painful to touch or w/ certain movements.   Review of Systems For ROS see HPI     Objective:   Physical Exam  Vitals reviewed. Constitutional: He appears well-developed and well-nourished. No distress.  HENT:  Head: Normocephalic and atraumatic.  No TTP over sinuses + turbinate edema + PND L TM w/ clear fluid present  Eyes: Conjunctivae and EOM are normal. Pupils are equal, round, and reactive to light.  Neck: Normal range of motion. Neck supple.  Cardiovascular: Normal rate, regular rhythm and normal heart sounds.   Pulmonary/Chest: Effort normal and breath sounds normal. No respiratory distress. He has no wheezes.  Abdominal: Soft. He exhibits no distension. There is tenderness (over discolored and bulging umbilical incisions, other incisions healing well).  Lymphadenopathy:    He has no cervical adenopathy.  Skin: Skin is warm and dry.          Assessment & Plan:

## 2013-02-07 NOTE — Patient Instructions (Addendum)
Take the Meclizine as needed for the dizziness Start the nasal spray- 2 sprays each nostril daily- to decrease congestion and ear fluid Change positions slowly Call when you're ready for your colonoscopy referral Call the surgeon and have them look at that incision Hang in there!

## 2013-02-07 NOTE — Assessment & Plan Note (Signed)
New.  No erythema or warmth around would but it is discolored and bulging- almost appearing like a hernia but more fluctuant.  Pt to call surgeon for evaluation and drainage if needed.

## 2013-02-07 NOTE — Assessment & Plan Note (Signed)
Recurrent issue.  No obvious OM.  Pt w/ serous fluid present.  Unable to take decongestants due to heart condition.  Start nasal steroids to decrease congestion.  Meclizine prn.  Will follow.

## 2013-02-09 ENCOUNTER — Encounter: Payer: Self-pay | Admitting: Internal Medicine

## 2013-02-09 ENCOUNTER — Ambulatory Visit (INDEPENDENT_AMBULATORY_CARE_PROVIDER_SITE_OTHER): Payer: No Typology Code available for payment source | Admitting: Internal Medicine

## 2013-02-09 VITALS — BP 110/80 | HR 63 | Ht 70.0 in | Wt 231.0 lb

## 2013-02-09 DIAGNOSIS — I4891 Unspecified atrial fibrillation: Secondary | ICD-10-CM

## 2013-02-09 NOTE — Patient Instructions (Addendum)
Your physician recommends that you schedule a follow-up appointment in: 3 months wit Dr Johney Frame
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
## 2013-02-13 ENCOUNTER — Other Ambulatory Visit: Payer: Self-pay | Admitting: Emergency Medicine

## 2013-02-13 MED ORDER — SIMVASTATIN 40 MG PO TABS: 40.0000 mg | ORAL_TABLET | Freq: Every day | ORAL | Status: DC

## 2013-02-24 ENCOUNTER — Ambulatory Visit (INDEPENDENT_AMBULATORY_CARE_PROVIDER_SITE_OTHER): Payer: No Typology Code available for payment source

## 2013-02-24 DIAGNOSIS — Z952 Presence of prosthetic heart valve: Secondary | ICD-10-CM

## 2013-02-24 DIAGNOSIS — I4891 Unspecified atrial fibrillation: Secondary | ICD-10-CM

## 2013-02-24 DIAGNOSIS — Z7901 Long term (current) use of anticoagulants: Secondary | ICD-10-CM

## 2013-02-24 DIAGNOSIS — Z954 Presence of other heart-valve replacement: Secondary | ICD-10-CM

## 2013-02-24 LAB — POCT INR: INR: 4.7

## 2013-03-06 ENCOUNTER — Telehealth: Payer: Self-pay | Admitting: Internal Medicine

## 2013-03-06 NOTE — Telephone Encounter (Signed)
New Prob      Pt has some questions regarding DILTIAZEM. Please call.

## 2013-03-06 NOTE — Telephone Encounter (Signed)
Pt wants you to know he is having to take the diltiazem daily because he goes back into afib daily. Medication list was not adjusted yet.

## 2013-03-17 ENCOUNTER — Ambulatory Visit (INDEPENDENT_AMBULATORY_CARE_PROVIDER_SITE_OTHER): Payer: No Typology Code available for payment source | Admitting: *Deleted

## 2013-03-17 DIAGNOSIS — Z7901 Long term (current) use of anticoagulants: Secondary | ICD-10-CM

## 2013-03-17 DIAGNOSIS — I4891 Unspecified atrial fibrillation: Secondary | ICD-10-CM

## 2013-03-17 DIAGNOSIS — Z954 Presence of other heart-valve replacement: Secondary | ICD-10-CM

## 2013-03-17 DIAGNOSIS — Z952 Presence of prosthetic heart valve: Secondary | ICD-10-CM

## 2013-03-17 LAB — POCT INR: INR: 5

## 2013-03-24 ENCOUNTER — Telehealth: Payer: Self-pay | Admitting: Internal Medicine

## 2013-03-24 NOTE — Telephone Encounter (Signed)
New Prob  Pt would like advice on seeing a sleep apnea doctor. He was wondering it there were any in the [facility].

## 2013-03-24 NOTE — Telephone Encounter (Signed)
Pt called to see if he can be referrer to a Pulmonary Doctor for a Sleep study. Pt was made aware that On the last pt O/V with Dr. Johney Frame, MD does not mention a referral. Pt states that Dr. Gala Romney wanted for him to make an appointment with pulmonary, a year ago, but he did not follow thru. Pt states will call Dr. Prescott Gum nurse regarding this issue.

## 2013-03-27 ENCOUNTER — Telehealth (HOSPITAL_COMMUNITY): Payer: Self-pay | Admitting: *Deleted

## 2013-03-27 DIAGNOSIS — R0683 Snoring: Secondary | ICD-10-CM

## 2013-03-27 NOTE — Telephone Encounter (Signed)
I would be happy to see him but it sounds like he just needs referral to Pulmonary for sleep study. Please refer to Dr. Vassie Loll or Craige Cotta and can make f/u appt with me if he needs it. He has been following mainly with Dr. Johney Frame as AF has been major problem recently.

## 2013-03-27 NOTE — Telephone Encounter (Signed)
Per pt calling - states Dr Gala Romney told him over a year ago to schedule to see someone about his sleep apnea and need for CPAP as he continue to go in and out of it despite his 2nd ablation in March.  States he called to schedule an appt with Dr Johney Frame to follow up his At Fib but was told he needed to see Dr Reuben Likes.  When he tried to schedule with Dr Gala Romney was was told he is not his patient anymore.  Pt confused as to who his MD is and who he needs to follow up with.  He also wants to know the name of an MD to see about his sleep apnea.  Pt aware Dr Gala Romney is not in the office at this time but that I will forward this information to him and call pt back with follow up instructions.

## 2013-03-29 NOTE — Telephone Encounter (Signed)
Pt aware he has been referred to pulmonary for eval of sleep apnea d/t snoring and repeat At Fib

## 2013-03-30 ENCOUNTER — Ambulatory Visit (INDEPENDENT_AMBULATORY_CARE_PROVIDER_SITE_OTHER): Payer: No Typology Code available for payment source | Admitting: *Deleted

## 2013-03-30 DIAGNOSIS — Z952 Presence of prosthetic heart valve: Secondary | ICD-10-CM

## 2013-03-30 DIAGNOSIS — I4891 Unspecified atrial fibrillation: Secondary | ICD-10-CM

## 2013-03-30 DIAGNOSIS — Z954 Presence of other heart-valve replacement: Secondary | ICD-10-CM

## 2013-03-30 DIAGNOSIS — Z7901 Long term (current) use of anticoagulants: Secondary | ICD-10-CM

## 2013-03-30 LAB — POCT INR: INR: 4

## 2013-04-06 ENCOUNTER — Telehealth: Payer: Self-pay | Admitting: Internal Medicine

## 2013-04-06 DIAGNOSIS — I4891 Unspecified atrial fibrillation: Secondary | ICD-10-CM

## 2013-04-06 NOTE — Telephone Encounter (Signed)
Says he is having afib daily and wants to get a monitor to see what it is doing so Dr Johney Frame can see.  I will order and have him come in

## 2013-04-06 NOTE — Telephone Encounter (Signed)
New Prob ° ° ° ° ° °Pt has some questions regarding DILTIAZEM. Please call. °

## 2013-04-07 ENCOUNTER — Other Ambulatory Visit: Payer: Self-pay | Admitting: *Deleted

## 2013-04-07 ENCOUNTER — Ambulatory Visit (INDEPENDENT_AMBULATORY_CARE_PROVIDER_SITE_OTHER): Payer: No Typology Code available for payment source

## 2013-04-07 DIAGNOSIS — I4891 Unspecified atrial fibrillation: Secondary | ICD-10-CM

## 2013-04-07 MED ORDER — LISINOPRIL 20 MG PO TABS: 20.0000 mg | ORAL_TABLET | Freq: Every day | ORAL | Status: DC

## 2013-04-07 NOTE — Progress Notes (Signed)
Placed a 48 hr E-cardio holter montor

## 2013-04-20 ENCOUNTER — Ambulatory Visit (INDEPENDENT_AMBULATORY_CARE_PROVIDER_SITE_OTHER): Payer: No Typology Code available for payment source | Admitting: *Deleted

## 2013-04-20 DIAGNOSIS — I4891 Unspecified atrial fibrillation: Secondary | ICD-10-CM

## 2013-04-20 DIAGNOSIS — Z954 Presence of other heart-valve replacement: Secondary | ICD-10-CM

## 2013-04-20 DIAGNOSIS — Z952 Presence of prosthetic heart valve: Secondary | ICD-10-CM

## 2013-04-20 DIAGNOSIS — Z7901 Long term (current) use of anticoagulants: Secondary | ICD-10-CM

## 2013-04-20 LAB — POCT INR: INR: 5

## 2013-04-28 ENCOUNTER — Ambulatory Visit: Payer: No Typology Code available for payment source | Admitting: Family Medicine

## 2013-05-03 ENCOUNTER — Ambulatory Visit: Payer: No Typology Code available for payment source | Admitting: Family Medicine

## 2013-05-04 ENCOUNTER — Encounter (HOSPITAL_COMMUNITY): Payer: Self-pay

## 2013-05-04 ENCOUNTER — Ambulatory Visit (HOSPITAL_COMMUNITY)
Admission: RE | Admit: 2013-05-04 | Discharge: 2013-05-04 | Disposition: A | Discharge status: Home / Self Care | Payer: No Typology Code available for payment source | Source: Ambulatory Visit | Attending: Internal Medicine | Admitting: Internal Medicine

## 2013-05-04 VITALS — BP 108/70 | HR 69 | Ht 71.0 in | Wt 238.0 lb

## 2013-05-04 DIAGNOSIS — I719 Aortic aneurysm of unspecified site, without rupture: Secondary | ICD-10-CM

## 2013-05-04 DIAGNOSIS — I10 Essential (primary) hypertension: Secondary | ICD-10-CM | POA: Insufficient documentation

## 2013-05-04 DIAGNOSIS — I712 Thoracic aortic aneurysm, without rupture, unspecified: Secondary | ICD-10-CM | POA: Insufficient documentation

## 2013-05-04 DIAGNOSIS — I4891 Unspecified atrial fibrillation: Secondary | ICD-10-CM | POA: Insufficient documentation

## 2013-05-04 DIAGNOSIS — Z954 Presence of other heart-valve replacement: Secondary | ICD-10-CM | POA: Insufficient documentation

## 2013-05-04 DIAGNOSIS — Z7982 Long term (current) use of aspirin: Secondary | ICD-10-CM | POA: Insufficient documentation

## 2013-05-04 DIAGNOSIS — E785 Hyperlipidemia, unspecified: Secondary | ICD-10-CM | POA: Insufficient documentation

## 2013-05-04 DIAGNOSIS — I2510 Atherosclerotic heart disease of native coronary artery without angina pectoris: Secondary | ICD-10-CM | POA: Insufficient documentation

## 2013-05-04 DIAGNOSIS — Z79899 Other long term (current) drug therapy: Secondary | ICD-10-CM | POA: Insufficient documentation

## 2013-05-04 DIAGNOSIS — G4733 Obstructive sleep apnea (adult) (pediatric): Secondary | ICD-10-CM | POA: Insufficient documentation

## 2013-05-04 DIAGNOSIS — Z7901 Long term (current) use of anticoagulants: Secondary | ICD-10-CM | POA: Insufficient documentation
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
## 2013-05-04 NOTE — Patient Instructions (Addendum)
You have been referred to Dr Vassie Loll, Oct 10th at 9:00 am  We will contact you in 6 months to schedule your next appointment and echocardiogram

## 2013-05-04 NOTE — Addendum Note (Signed)
Encounter addended by: Noralee Space, RN on: 05/04/2013 10:14 AM<BR>     Documentation filed: Patient Instructions Section, Orders

## 2013-05-05 ENCOUNTER — Encounter (HOSPITAL_COMMUNITY): Payer: Self-pay | Admitting: Emergency Medicine

## 2013-05-05 ENCOUNTER — Emergency Department (HOSPITAL_COMMUNITY)
Admission: EM | Admit: 2013-05-05 | Discharge: 2013-05-05 | Disposition: A | Discharge status: Home / Self Care | Payer: No Typology Code available for payment source | Attending: Emergency Medicine | Admitting: Emergency Medicine

## 2013-05-05 DIAGNOSIS — I10 Essential (primary) hypertension: Secondary | ICD-10-CM | POA: Insufficient documentation

## 2013-05-05 DIAGNOSIS — G4733 Obstructive sleep apnea (adult) (pediatric): Secondary | ICD-10-CM | POA: Insufficient documentation

## 2013-05-05 DIAGNOSIS — I2510 Atherosclerotic heart disease of native coronary artery without angina pectoris: Secondary | ICD-10-CM | POA: Insufficient documentation

## 2013-05-05 DIAGNOSIS — R0602 Shortness of breath: Secondary | ICD-10-CM | POA: Insufficient documentation

## 2013-05-05 DIAGNOSIS — Z8719 Personal history of other diseases of the digestive system: Secondary | ICD-10-CM | POA: Insufficient documentation

## 2013-05-05 DIAGNOSIS — R002 Palpitations: Secondary | ICD-10-CM | POA: Insufficient documentation

## 2013-05-05 DIAGNOSIS — Z79899 Other long term (current) drug therapy: Secondary | ICD-10-CM | POA: Insufficient documentation

## 2013-05-05 DIAGNOSIS — Z8619 Personal history of other infectious and parasitic diseases: Secondary | ICD-10-CM | POA: Insufficient documentation

## 2013-05-05 DIAGNOSIS — Z7982 Long term (current) use of aspirin: Secondary | ICD-10-CM | POA: Insufficient documentation

## 2013-05-05 DIAGNOSIS — Z87442 Personal history of urinary calculi: Secondary | ICD-10-CM | POA: Insufficient documentation

## 2013-05-05 DIAGNOSIS — Z7901 Long term (current) use of anticoagulants: Secondary | ICD-10-CM | POA: Insufficient documentation

## 2013-05-05 NOTE — Addendum Note (Signed)
Encounter addended by: Simon Rhein, CCT on: 05/05/2013  8:40 AM<BR>     Documentation filed: Charges VN
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
## 2013-05-05 NOTE — ED Notes (Signed)
Pt. reports palpitations onset this evening , denies chest pain , slight SOB , no nausea or diaphoresis , pt. stated history of atrial fibrillation - his cardiologist is Dr. Clarise Cruz .
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
## 2013-05-05 NOTE — ED Notes (Signed)
Discharge instructions reviewed with pt. Pt verbalized understanding.   

## 2013-05-10 ENCOUNTER — Ambulatory Visit (INDEPENDENT_AMBULATORY_CARE_PROVIDER_SITE_OTHER): Payer: No Typology Code available for payment source | Admitting: *Deleted

## 2013-05-10 ENCOUNTER — Encounter: Payer: Self-pay | Admitting: Internal Medicine

## 2013-05-10 ENCOUNTER — Ambulatory Visit (INDEPENDENT_AMBULATORY_CARE_PROVIDER_SITE_OTHER): Payer: No Typology Code available for payment source | Admitting: Internal Medicine

## 2013-05-10 ENCOUNTER — Encounter (HOSPITAL_COMMUNITY): Payer: Self-pay | Admitting: Pharmacy Technician

## 2013-05-10 VITALS — BP 120/80 | HR 59 | Ht 72.0 in | Wt 238.0 lb

## 2013-05-10 DIAGNOSIS — Z954 Presence of other heart-valve replacement: Secondary | ICD-10-CM

## 2013-05-10 DIAGNOSIS — Z952 Presence of prosthetic heart valve: Secondary | ICD-10-CM

## 2013-05-10 DIAGNOSIS — I4891 Unspecified atrial fibrillation: Secondary | ICD-10-CM

## 2013-05-10 DIAGNOSIS — Z7901 Long term (current) use of anticoagulants: Secondary | ICD-10-CM

## 2013-05-10 DIAGNOSIS — R002 Palpitations: Secondary | ICD-10-CM

## 2013-05-10 DIAGNOSIS — G4733 Obstructive sleep apnea (adult) (pediatric): Secondary | ICD-10-CM

## 2013-05-10 LAB — POCT INR: INR: 3.5

## 2013-05-10 NOTE — Patient Instructions (Signed)
LINQ monitor Implant Fri 05/12/13  Check in at the [facility] Entrance at 6:00am  Don't eat or drink after midnight
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
## 2013-05-11 ENCOUNTER — Ambulatory Visit: Payer: No Typology Code available for payment source | Admitting: Family Medicine

## 2013-05-11 DIAGNOSIS — Z0289 Encounter for other administrative examinations: Secondary | ICD-10-CM

## 2013-05-12 ENCOUNTER — Other Ambulatory Visit: Payer: Self-pay | Admitting: *Deleted

## 2013-05-12 ENCOUNTER — Ambulatory Visit (HOSPITAL_COMMUNITY)
Admission: RE | Admit: 2013-05-12 | Discharge: 2013-05-12 | Disposition: A | Discharge status: Home / Self Care | Payer: No Typology Code available for payment source | Source: Ambulatory Visit | Attending: Internal Medicine | Admitting: Internal Medicine

## 2013-05-12 ENCOUNTER — Encounter (HOSPITAL_COMMUNITY)
Admission: RE | Disposition: A | Discharge status: Home / Self Care | Payer: Self-pay | Source: Ambulatory Visit | Attending: Internal Medicine

## 2013-05-12 DIAGNOSIS — I4891 Unspecified atrial fibrillation: Secondary | ICD-10-CM | POA: Insufficient documentation

## 2013-05-12 DIAGNOSIS — R002 Palpitations: Secondary | ICD-10-CM

## 2013-05-12 DIAGNOSIS — Z7901 Long term (current) use of anticoagulants: Secondary | ICD-10-CM | POA: Insufficient documentation

## 2013-05-12 DIAGNOSIS — I10 Essential (primary) hypertension: Secondary | ICD-10-CM | POA: Insufficient documentation

## 2013-05-12 DIAGNOSIS — G4733 Obstructive sleep apnea (adult) (pediatric): Secondary | ICD-10-CM | POA: Insufficient documentation

## 2013-05-12 HISTORY — PX: LOOP RECORDER IMPLANT: SHX5477

## 2013-05-12 LAB — SURGICAL PCR SCREEN
MRSA, PCR: POSITIVE — AB
Staphylococcus aureus: POSITIVE — AB

## 2013-05-12 SURGERY — LOOP RECORDER IMPLANT
Anesthesia: LOCAL

## 2013-05-12 MED ORDER — MUPIROCIN 2 % EX OINT
TOPICAL_OINTMENT | CUTANEOUS | Status: AC
  Filled 2013-05-12: qty 22

## 2013-05-12 MED ORDER — MUPIROCIN 2 % EX OINT
TOPICAL_OINTMENT | Freq: Two times a day (BID) | CUTANEOUS | Status: DC
  Administered 2013-05-12: 1 via NASAL
  Filled 2013-05-12: qty 22
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
## 2013-05-12 NOTE — Op Note (Signed)
SURGEON:  Hillis Range, MD     PREPROCEDURE DIAGNOSIS:  Palpitations, atrial fibrillation    POSTPROCEDURE DIAGNOSIS:  Palpitations, atrial fibrillation     PROCEDURES:   1. Implantable loop recorder implantation    INTRODUCTION:  Mark [name] is a 52 y.o. male with a history of palpitations who presents today for implantable loop implantation.  The patient has had two afib ablations for atrial fibrillation.  He has frequent palpitations at this time.  These are mostly short lived.  His symptoms are not characteristic of his prior afib.  They are quite bothersome to him.  He has worn several event monitors in the past. Despite an extensive workup by neurology, no reversible causes have been identified.  The patient therefore presents today for implantable loop implantation to help guide further management.     DESCRIPTION OF PROCEDURE:  Informed written consent was obtained, and the patient was brought to the electrophysiology lab in a fasting state.  The patient required no sedation for the procedure today.  Mapping over the patient's chest was performed by the EP lab staff to identify the area where electrograms were most prominent for ILR recording.  This area was found to be the left parasternal region over the 3rd-4th intercostal space. The patients left chest was therefore prepped and draped in the usual sterile fashion by the EP lab staff. The skin overlying the left parasternal region was infiltrated with lidocaine for local analgesia.  A 0.5-cm incision was made over the left parasternal region over the 3rd intercostal space.  A subcutaneous ILR pocket was fashioned using a combination of sharp and blunt dissection.  A Medtronic Reveal [place] model [id number] implantable loop recorder was then placed into the pocket  R waves were very prominent and measured 0.18mV.  Steri- Strips and a sterile dressing were then applied.  There were no early apparent complications.      CONCLUSIONS:   1. Successful implantation of a Medtronic Reveal LINQ implantable loop recorder for palpitations  2. No early apparent complications.

## 2013-05-12 NOTE — Interval H&P Note (Signed)
History and Physical Interval Note:  05/12/2013 7:22 AM  Mark [name]  has presented today for surgery, with the diagnosis of afib  The various methods of treatment have been discussed with the patient and family. After consideration of risks, benefits and other options for treatment, the patient has consented to  Procedure(s): LOOP RECORDER IMPLANT (N/A) as a surgical intervention .  The patient's history has been reviewed, patient examined, no change in status, stable for surgery.  I have reviewed the patient's chart and labs.  Questions were answered to the patient's satisfaction.     Hillis Range

## 2013-05-22 ENCOUNTER — Ambulatory Visit (INDEPENDENT_AMBULATORY_CARE_PROVIDER_SITE_OTHER): Payer: No Typology Code available for payment source | Admitting: *Deleted

## 2013-05-22 DIAGNOSIS — I4891 Unspecified atrial fibrillation: Secondary | ICD-10-CM

## 2013-05-22 LAB — PACEMAKER DEVICE OBSERVATION
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
## 2013-05-28 ENCOUNTER — Emergency Department (HOSPITAL_COMMUNITY): Payer: No Typology Code available for payment source

## 2013-05-28 ENCOUNTER — Inpatient Hospital Stay (HOSPITAL_COMMUNITY)
Admission: EM | Admit: 2013-05-28 | Discharge: 2013-05-29 | DRG: 552 | Disposition: A | Discharge status: Home / Self Care | Payer: No Typology Code available for payment source | Attending: Neurological Surgery | Admitting: Neurological Surgery

## 2013-05-28 ENCOUNTER — Encounter (HOSPITAL_COMMUNITY): Payer: Self-pay | Admitting: *Deleted

## 2013-05-28 DIAGNOSIS — Z91199 Patient's noncompliance with other medical treatment and regimen due to unspecified reason: Secondary | ICD-10-CM

## 2013-05-28 DIAGNOSIS — Z954 Presence of other heart-valve replacement: Secondary | ICD-10-CM

## 2013-05-28 DIAGNOSIS — Z7901 Long term (current) use of anticoagulants: Secondary | ICD-10-CM

## 2013-05-28 DIAGNOSIS — S32019A Unspecified fracture of first lumbar vertebra, initial encounter for closed fracture: Secondary | ICD-10-CM

## 2013-05-28 DIAGNOSIS — I712 Thoracic aortic aneurysm, without rupture, unspecified: Secondary | ICD-10-CM | POA: Diagnosis present

## 2013-05-28 DIAGNOSIS — Z9119 Patient's noncompliance with other medical treatment and regimen: Secondary | ICD-10-CM

## 2013-05-28 DIAGNOSIS — I359 Nonrheumatic aortic valve disorder, unspecified: Secondary | ICD-10-CM | POA: Diagnosis present

## 2013-05-28 DIAGNOSIS — I10 Essential (primary) hypertension: Secondary | ICD-10-CM | POA: Diagnosis present

## 2013-05-28 DIAGNOSIS — I2510 Atherosclerotic heart disease of native coronary artery without angina pectoris: Secondary | ICD-10-CM | POA: Diagnosis present

## 2013-05-28 DIAGNOSIS — S32010A Wedge compression fracture of first lumbar vertebra, initial encounter for closed fracture: Secondary | ICD-10-CM

## 2013-05-28 DIAGNOSIS — S32009A Unspecified fracture of unspecified lumbar vertebra, initial encounter for closed fracture: Principal | ICD-10-CM | POA: Diagnosis present

## 2013-05-28 DIAGNOSIS — I4891 Unspecified atrial fibrillation: Secondary | ICD-10-CM | POA: Diagnosis present

## 2013-05-28 DIAGNOSIS — G4733 Obstructive sleep apnea (adult) (pediatric): Secondary | ICD-10-CM | POA: Diagnosis present

## 2013-05-28 LAB — BASIC METABOLIC PANEL
BUN: 18 mg/dL (ref 6–23)
CO2: 23 mEq/L (ref 19–32)
Calcium: 9.3 mg/dL (ref 8.4–10.5)
Chloride: 103 mEq/L (ref 96–112)
Creatinine, Ser: 1.21 mg/dL (ref 0.50–1.35)
GFR calc Af Amer: 80 mL/min — ABNORMAL LOW (ref >90)
GFR calc non Af Amer: 69 mL/min — ABNORMAL LOW (ref >90)
Glucose, Bld: 104 mg/dL — ABNORMAL HIGH (ref 70–99)
Potassium: 3.9 mEq/L (ref 3.5–5.1)
Sodium: 139 mEq/L (ref 135–145)

## 2013-05-28 LAB — CBC
HCT: 41.1 % (ref 39.0–52.0)
Hemoglobin: 14.6 g/dL (ref 13.0–17.0)
MCH: 30.8 pg (ref 26.0–34.0)
MCHC: 35.5 g/dL (ref 30.0–36.0)
MCV: 86.7 fL (ref 78.0–100.0)
Platelets: 153 10*3/uL (ref 150–400)
RBC: 4.74 MIL/uL (ref 4.22–5.81)
RDW: 12.7 % (ref 11.5–15.5)
WBC: 7.6 10*3/uL (ref 4.0–10.5)

## 2013-05-28 LAB — ABO/RH: ABO/RH(D): A NEG

## 2013-05-28 LAB — TYPE AND SCREEN
ABO/RH(D): A NEG
Antibody Screen: NEGATIVE

## 2013-05-28 LAB — LACTIC ACID, PLASMA: Lactic Acid, Venous: 1.5 mmol/L (ref 0.5–2.2)

## 2013-05-28 LAB — PROTIME-INR
INR: 3.16 — ABNORMAL HIGH (ref 0.00–1.49)
Prothrombin Time: 31.3 seconds — ABNORMAL HIGH (ref 11.6–15.2)

## 2013-05-28 MED ORDER — HYDROMORPHONE HCL PF 1 MG/ML IJ SOLN
1.0000 mg | Freq: Once | INTRAMUSCULAR | Status: AC
  Administered 2013-05-28: 1 mg via INTRAVENOUS
  Filled 2013-05-28: qty 1

## 2013-05-28 MED ORDER — ONDANSETRON 4 MG PO TBDP
ORAL_TABLET | ORAL | Status: AC
  Filled 2013-05-28: qty 1

## 2013-05-28 MED ORDER — IBUPROFEN 800 MG PO TABS
800.0000 mg | ORAL_TABLET | Freq: Once | ORAL | Status: AC
  Administered 2013-05-28: 800 mg via ORAL
  Filled 2013-05-28: qty 1

## 2013-05-28 MED ORDER — DOCUSATE SODIUM 100 MG PO CAPS
100.0000 mg | ORAL_CAPSULE | Freq: Two times a day (BID) | ORAL | Status: DC
  Administered 2013-05-29 (×2): 100 mg via ORAL
  Filled 2013-05-28 (×2): qty 1

## 2013-05-28 MED ORDER — POLYETHYLENE GLYCOL 3350 17 G PO PACK
17.0000 g | PACK | Freq: Every day | ORAL | Status: DC | PRN
  Filled 2013-05-28: qty 1

## 2013-05-28 MED ORDER — WARFARIN SODIUM 10 MG PO TABS
10.0000 mg | ORAL_TABLET | ORAL | Status: DC
  Filled 2013-05-28: qty 1

## 2013-05-28 MED ORDER — DIPHENHYDRAMINE HCL 12.5 MG/5ML PO ELIX: 12.5000 mg | ORAL_SOLUTION | Freq: Four times a day (QID) | ORAL | Status: DC | PRN

## 2013-05-28 MED ORDER — WARFARIN - PHYSICIAN DOSING INPATIENT
Freq: Every day | Status: DC
Start: 2013-05-29 — End: 2013-05-29

## 2013-05-28 MED ORDER — DOFETILIDE 500 MCG PO CAPS
500.0000 ug | ORAL_CAPSULE | Freq: Two times a day (BID) | ORAL | Status: DC
  Administered 2013-05-29 (×2): 500 ug via ORAL
  Filled 2013-05-28 (×3): qty 1

## 2013-05-28 MED ORDER — ONDANSETRON 4 MG PO TBDP
4.0000 mg | ORAL_TABLET | Freq: Once | ORAL | Status: AC
  Administered 2013-05-28: 4 mg via ORAL

## 2013-05-28 MED ORDER — DILTIAZEM HCL ER COATED BEADS 180 MG PO CP24
180.0000 mg | ORAL_CAPSULE | Freq: Every day | ORAL | Status: DC
  Filled 2013-05-28: qty 1

## 2013-05-28 MED ORDER — FENTANYL 10 MCG/ML IV SOLN
INTRAVENOUS | Status: DC
  Administered 2013-05-29: 01:00:00 via INTRAVENOUS
  Filled 2013-05-28: qty 50

## 2013-05-28 MED ORDER — BISACODYL 10 MG RE SUPP: 10.0000 mg | Freq: Every day | RECTAL | Status: DC | PRN

## 2013-05-28 MED ORDER — SENNA 8.6 MG PO TABS
1.0000 | ORAL_TABLET | Freq: Two times a day (BID) | ORAL | Status: DC
  Administered 2013-05-29 (×2): 8.6 mg via ORAL
  Filled 2013-05-28 (×3): qty 1

## 2013-05-28 MED ORDER — METHOCARBAMOL 100 MG/ML IJ SOLN
500.0000 mg | Freq: Four times a day (QID) | INTRAVENOUS | Status: DC | PRN
  Filled 2013-05-28: qty 5

## 2013-05-28 MED ORDER — SIMVASTATIN 40 MG PO TABS
40.0000 mg | ORAL_TABLET | Freq: Every day | ORAL | Status: DC
  Administered 2013-05-29: 40 mg via ORAL
  Filled 2013-05-28 (×2): qty 1

## 2013-05-28 MED ORDER — KETOROLAC TROMETHAMINE 15 MG/ML IJ SOLN
15.0000 mg | Freq: Four times a day (QID) | INTRAMUSCULAR | Status: DC
  Administered 2013-05-29 (×3): 15 mg via INTRAVENOUS
  Filled 2013-05-28 (×5): qty 1

## 2013-05-28 MED ORDER — ONDANSETRON HCL 4 MG/2ML IJ SOLN
4.0000 mg | Freq: Once | INTRAMUSCULAR | Status: AC
  Administered 2013-05-28: 4 mg via INTRAVENOUS
  Filled 2013-05-28: qty 2

## 2013-05-28 MED ORDER — DIPHENHYDRAMINE HCL 50 MG/ML IJ SOLN: 12.5000 mg | Freq: Four times a day (QID) | INTRAMUSCULAR | Status: DC | PRN

## 2013-05-28 MED ORDER — PANTOPRAZOLE SODIUM 40 MG PO TBEC
40.0000 mg | DELAYED_RELEASE_TABLET | Freq: Every day | ORAL | Status: DC
  Administered 2013-05-29: 40 mg via ORAL
  Filled 2013-05-28: qty 1

## 2013-05-28 MED ORDER — SODIUM CHLORIDE 0.9 % IJ SOLN: 9.0000 mL | INTRAMUSCULAR | Status: DC | PRN

## 2013-05-28 MED ORDER — WARFARIN SODIUM 7.5 MG PO TABS: 15.0000 mg | ORAL_TABLET | ORAL | Status: DC

## 2013-05-28 MED ORDER — ACETAMINOPHEN 500 MG PO TABS
1000.0000 mg | ORAL_TABLET | Freq: Once | ORAL | Status: AC
  Administered 2013-05-28: 1000 mg via ORAL
  Filled 2013-05-28: qty 2

## 2013-05-28 MED ORDER — IOHEXOL 300 MG/ML  SOLN
100.0000 mL | Freq: Once | INTRAMUSCULAR | Status: AC | PRN
  Administered 2013-05-28: 100 mL via INTRAVENOUS

## 2013-05-28 MED ORDER — ONDANSETRON HCL 4 MG/2ML IJ SOLN: 4.0000 mg | Freq: Four times a day (QID) | INTRAMUSCULAR | Status: DC | PRN

## 2013-05-28 MED ORDER — NALOXONE HCL 0.4 MG/ML IJ SOLN: 0.4000 mg | INTRAMUSCULAR | Status: DC | PRN

## 2013-05-28 MED ORDER — LISINOPRIL 20 MG PO TABS
20.0000 mg | ORAL_TABLET | Freq: Every day | ORAL | Status: DC
  Administered 2013-05-28: 20 mg via ORAL
  Filled 2013-05-28 (×2): qty 1

## 2013-05-28 MED ORDER — DILTIAZEM HCL ER BEADS 240 MG PO CP24: 180.0000 mg | ORAL_CAPSULE | Freq: Every day | ORAL | Status: DC | PRN

## 2013-05-28 MED ORDER — ONDANSETRON HCL 4 MG PO TABS: 4.0000 mg | ORAL_TABLET | Freq: Four times a day (QID) | ORAL | Status: DC | PRN

## 2013-05-28 MED ORDER — MAGNESIUM CITRATE PO SOLN
1.0000 | Freq: Once | ORAL | Status: AC | PRN
  Filled 2013-05-28: qty 296

## 2013-05-28 MED ORDER — KETOROLAC TROMETHAMINE 30 MG/ML IJ SOLN
30.0000 mg | Freq: Once | INTRAMUSCULAR | Status: AC
  Administered 2013-05-28: 30 mg via INTRAVENOUS
  Filled 2013-05-28: qty 1

## 2013-05-28 MED ORDER — ALUM & MAG HYDROXIDE-SIMETH 200-200-20 MG/5ML PO SUSP: 30.0000 mL | Freq: Four times a day (QID) | ORAL | Status: DC | PRN
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
## 2013-05-29 DIAGNOSIS — S32019A Unspecified fracture of first lumbar vertebra, initial encounter for closed fracture: Secondary | ICD-10-CM

## 2013-05-29 LAB — PROTIME-INR
INR: 2.89 — ABNORMAL HIGH (ref 0.00–1.49)
Prothrombin Time: 29.2 seconds — ABNORMAL HIGH (ref 11.6–15.2)

## 2013-05-29 MED ORDER — TRAMADOL HCL 50 MG PO TABS: 100.0000 mg | ORAL_TABLET | Freq: Four times a day (QID) | ORAL | Status: DC | PRN

## 2013-05-29 MED ORDER — METHOCARBAMOL 500 MG PO TABS: 500.0000 mg | ORAL_TABLET | Freq: Four times a day (QID) | ORAL | Status: DC

## 2013-05-29 MED ORDER — ATORVASTATIN CALCIUM 20 MG PO TABS
20.0000 mg | ORAL_TABLET | Freq: Every day | ORAL | Status: DC
  Filled 2013-05-29: qty 1

## 2013-05-29 NOTE — Progress Notes (Signed)
Charting error.  Selected wrong date and time.Oretha Milch D

## 2013-05-29 NOTE — Progress Notes (Signed)
Orthopedic Tech Progress Note Patient Details:  Mark [name] Dec 21, 1960 [id number]  Patient ID: Mark [name], male   DOB: [birth year], 52 y.o.   MRN: [id number]   [name] 05/29/2013, 3:11 PM TLSO completed by bio-tech.
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
## 2013-05-29 NOTE — Progress Notes (Signed)
Orthopedic Tech Progress Note Patient Details:  Mark [name] 1960-12-31 [id number]  Patient ID: Mark [name], male   DOB: 1960/09/03, 52 y.o.   MRN: [id number]   Mark [name] 05/29/2013, 8:50 AMCalled bio-tech for TLSO.
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
## 2013-05-29 NOTE — Progress Notes (Signed)
460 mcg fentanyl wasted down sink. Charge nurse Victorino Dike witnessed. Mark Muir Jayland Null,RN

## 2013-05-30 ENCOUNTER — Encounter: Payer: Self-pay | Admitting: Lab

## 2013-05-31 ENCOUNTER — Ambulatory Visit: Payer: No Typology Code available for payment source | Admitting: Family Medicine

## 2013-06-03 ENCOUNTER — Encounter: Payer: Self-pay | Admitting: Family Medicine

## 2013-06-03 ENCOUNTER — Ambulatory Visit (INDEPENDENT_AMBULATORY_CARE_PROVIDER_SITE_OTHER): Payer: No Typology Code available for payment source | Admitting: Family Medicine

## 2013-06-03 ENCOUNTER — Encounter: Payer: Self-pay | Admitting: Internal Medicine

## 2013-06-03 VITALS — BP 110/72 | HR 60 | Temp 97.8°F | Wt 240.0 lb

## 2013-06-03 DIAGNOSIS — J020 Streptococcal pharyngitis: Secondary | ICD-10-CM

## 2013-06-03 DIAGNOSIS — J069 Acute upper respiratory infection, unspecified: Secondary | ICD-10-CM | POA: Insufficient documentation

## 2013-06-03 LAB — POCT RAPID STREP A (OFFICE): Rapid Strep A Screen: NEGATIVE

## 2013-06-03 MED ORDER — LIDOCAINE VISCOUS 2 % MT SOLN: 5.0000 mL | OROMUCOSAL | Status: DC | PRN
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
## 2013-06-03 NOTE — Patient Instructions (Addendum)
Drink plenty of fluids, gargle with warm salt water for your throat and use the lidocaine for your throat.  This should gradually improve- if not then notify your regular doc.  Take care.

## 2013-06-03 NOTE — Assessment & Plan Note (Signed)
Likely viral, RST neg.  Would use lidocaine viscous for ST and f/u prn.  Would not appear to be in need of abx at this point.  D/w pt.  He agrees. Nontoxic.

## 2013-06-05 ENCOUNTER — Telehealth: Payer: Self-pay | Admitting: *Deleted
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
## 2013-06-09 ENCOUNTER — Ambulatory Visit (INDEPENDENT_AMBULATORY_CARE_PROVIDER_SITE_OTHER): Payer: No Typology Code available for payment source | Admitting: *Deleted

## 2013-06-09 ENCOUNTER — Ambulatory Visit (INDEPENDENT_AMBULATORY_CARE_PROVIDER_SITE_OTHER): Payer: No Typology Code available for payment source | Admitting: Pulmonary Disease

## 2013-06-09 ENCOUNTER — Encounter: Payer: Self-pay | Admitting: Pulmonary Disease

## 2013-06-09 VITALS — BP 130/76 | HR 70 | Temp 98.2°F | Ht 71.0 in | Wt 240.2 lb

## 2013-06-09 DIAGNOSIS — Z7901 Long term (current) use of anticoagulants: Secondary | ICD-10-CM

## 2013-06-09 DIAGNOSIS — G4733 Obstructive sleep apnea (adult) (pediatric): Secondary | ICD-10-CM

## 2013-06-09 DIAGNOSIS — Z954 Presence of other heart-valve replacement: Secondary | ICD-10-CM

## 2013-06-09 DIAGNOSIS — I4891 Unspecified atrial fibrillation: Secondary | ICD-10-CM

## 2013-06-09 DIAGNOSIS — Z952 Presence of prosthetic heart valve: Secondary | ICD-10-CM

## 2013-06-09 LAB — POCT INR: INR: 5.5
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
## 2013-06-09 NOTE — Assessment & Plan Note (Signed)
Home sleep study You will likely need CPAP titration after this Make appt few weeks after starting CPAP   The pathophysiology of obstructive sleep apnea , it's cardiovascular consequences & modes of treatment including CPAP were discused with the patient in detail & they evidenced understanding.

## 2013-06-09 NOTE — Patient Instructions (Signed)
Home sleep study You will likely need CPAP titration after this Make appt few weeks after starting CPAP
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
## 2013-06-15 ENCOUNTER — Telehealth: Payer: Self-pay | Admitting: Internal Medicine

## 2013-06-15 NOTE — Telephone Encounter (Signed)
New message    Talk to Kennon Rounds about stopping coumadin---having a procedure next week

## 2013-06-16 DIAGNOSIS — G4733 Obstructive sleep apnea (adult) (pediatric): Secondary | ICD-10-CM

## 2013-06-16 NOTE — Telephone Encounter (Signed)
Pt called stating procedure is scheduled for October 31,2014 and he is to hold coumadin for 5 days before.He already has appt scheduled for 06/23/2013 so instructed to keep this appt and we will do lovenox bridge and teaching at this time and pt states understanding

## 2013-06-19 ENCOUNTER — Encounter: Payer: Self-pay | Admitting: Internal Medicine

## 2013-06-19 ENCOUNTER — Ambulatory Visit (INDEPENDENT_AMBULATORY_CARE_PROVIDER_SITE_OTHER): Payer: No Typology Code available for payment source | Admitting: *Deleted

## 2013-06-19 DIAGNOSIS — I4891 Unspecified atrial fibrillation: Secondary | ICD-10-CM

## 2013-06-22 ENCOUNTER — Telehealth: Payer: Self-pay | Admitting: Pharmacist

## 2013-06-22 NOTE — Telephone Encounter (Signed)
Spoke with pt.  He has canceled his procedure for next week and will not need Lovenox bridge.

## 2013-06-22 NOTE — Telephone Encounter (Signed)
New Problem      Pt would like to ask you some questions about his meds.  Please give him a call back   Thanks!

## 2013-06-23 ENCOUNTER — Ambulatory Visit (INDEPENDENT_AMBULATORY_CARE_PROVIDER_SITE_OTHER): Payer: PRIVATE HEALTH INSURANCE | Admitting: Pharmacist

## 2013-06-23 DIAGNOSIS — I4891 Unspecified atrial fibrillation: Secondary | ICD-10-CM

## 2013-06-23 DIAGNOSIS — Z952 Presence of prosthetic heart valve: Secondary | ICD-10-CM

## 2013-06-23 DIAGNOSIS — Z7901 Long term (current) use of anticoagulants: Secondary | ICD-10-CM

## 2013-06-23 DIAGNOSIS — Z954 Presence of other heart-valve replacement: Secondary | ICD-10-CM

## 2013-06-23 LAB — POCT INR: INR: 3

## 2013-06-30 ENCOUNTER — Telehealth: Payer: Self-pay | Admitting: Pulmonary Disease

## 2013-06-30 DIAGNOSIS — G4733 Obstructive sleep apnea (adult) (pediatric): Secondary | ICD-10-CM

## 2013-06-30 NOTE — Telephone Encounter (Signed)
Home study confirmed OSA -mild, AHI 14/h, desatn 79% lowest Proceed with cpap titration study if agreeable

## 2013-06-30 NOTE — Telephone Encounter (Signed)
Pt was agreeable to study. Order placed to [facility]. Will forward as an [place]

## 2013-07-03 ENCOUNTER — Telehealth: Payer: Self-pay | Admitting: Internal Medicine

## 2013-07-03 ENCOUNTER — Encounter: Payer: Self-pay | Admitting: Pulmonary Disease

## 2013-07-03 LAB — PACEMAKER DEVICE OBSERVATION

## 2013-07-03 NOTE — Telephone Encounter (Signed)
New Problem  Pt states that he has questions about his device... He wants to know how it records and if the signals are being received... Please call back to discuss.

## 2013-07-04 ENCOUNTER — Ambulatory Visit (INDEPENDENT_AMBULATORY_CARE_PROVIDER_SITE_OTHER): Payer: No Typology Code available for payment source | Admitting: *Deleted

## 2013-07-04 DIAGNOSIS — Z952 Presence of prosthetic heart valve: Secondary | ICD-10-CM

## 2013-07-04 DIAGNOSIS — I4891 Unspecified atrial fibrillation: Secondary | ICD-10-CM

## 2013-07-04 DIAGNOSIS — Z954 Presence of other heart-valve replacement: Secondary | ICD-10-CM

## 2013-07-04 DIAGNOSIS — Z7901 Long term (current) use of anticoagulants: Secondary | ICD-10-CM

## 2013-07-04 LAB — PACEMAKER DEVICE OBSERVATION

## 2013-07-04 LAB — POCT INR: INR: 2.4

## 2013-07-04 NOTE — Progress Notes (Signed)
Pt here for PT check and was questioning episodes of AF on 07-03-13. No alerts on [facility] but was interrogated in person. No episodes recorded except symptom recorded episodes. Pt aware of no episodes. Follow up as planned.

## 2013-07-04 NOTE — Telephone Encounter (Signed)
Pt scheduled for ILR check while in office for Coumadin check.

## 2013-07-05 ENCOUNTER — Encounter: Payer: Self-pay | Admitting: Internal Medicine

## 2013-07-06 NOTE — Progress Notes (Signed)
Remote loop received

## 2013-07-07 ENCOUNTER — Other Ambulatory Visit: Payer: Self-pay | Admitting: Pharmacist

## 2013-07-07 MED ORDER — WARFARIN SODIUM 10 MG PO TABS: ORAL_TABLET | ORAL | Status: DC

## 2013-07-12 ENCOUNTER — Ambulatory Visit (INDEPENDENT_AMBULATORY_CARE_PROVIDER_SITE_OTHER): Payer: No Typology Code available for payment source | Admitting: Family Medicine

## 2013-07-12 ENCOUNTER — Encounter: Payer: Self-pay | Admitting: Family Medicine

## 2013-07-12 VITALS — BP 130/78 | HR 63 | Temp 98.3°F | Resp 16 | Wt 241.2 lb

## 2013-07-12 DIAGNOSIS — J069 Acute upper respiratory infection, unspecified: Secondary | ICD-10-CM

## 2013-07-12 DIAGNOSIS — J029 Acute pharyngitis, unspecified: Secondary | ICD-10-CM

## 2013-07-12 LAB — POCT RAPID STREP A (OFFICE): Rapid Strep A Screen: NEGATIVE

## 2013-07-12 MED ORDER — AMOXICILLIN 875 MG PO TABS: 875.0000 mg | ORAL_TABLET | Freq: Two times a day (BID) | ORAL | Status: DC

## 2013-07-12 MED ORDER — PROMETHAZINE-DM 6.25-15 MG/5ML PO SYRP: 5.0000 mL | ORAL_SOLUTION | Freq: Four times a day (QID) | ORAL | Status: DC | PRN

## 2013-07-12 NOTE — Progress Notes (Signed)
  Subjective:    Patient ID: Mark [name], male    DOB: September 22, 1960, 52 y.o.   MRN: [id number]  HPI URI- sxs started Monday w/ sore throat.  Now w/ nonproductive hacking cough.  L ear pain.  + N/V/D on Monday.  Subjective fevers.  No known sick contacts.  Hx of multiple cardiac issues and coughing put pt into Afib last night.   Review of Systems For ROS see HPI     Objective:   Physical Exam  Vitals reviewed. Constitutional: He appears well-developed and well-nourished. No distress.  HENT:  Head: Normocephalic and atraumatic.  Right Ear: Tympanic membrane normal.  Left Ear: Tympanic membrane normal.  Nose: No mucosal edema or rhinorrhea. Right sinus exhibits no maxillary sinus tenderness and no frontal sinus tenderness. Left sinus exhibits no maxillary sinus tenderness and no frontal sinus tenderness.  Mouth/Throat: Mucous membranes are normal. No oropharyngeal exudate, posterior oropharyngeal edema or posterior oropharyngeal erythema.  Eyes: Conjunctivae and EOM are normal. Pupils are equal, round, and reactive to light.  Neck: Normal range of motion. Neck supple.  Cardiovascular: Normal rate and regular rhythm.   Murmur heard. Pulmonary/Chest: Effort normal and breath sounds normal. No respiratory distress. He has no wheezes.  + hacking cough  Lymphadenopathy:    He has no cervical adenopathy.  Skin: Skin is warm and dry.          Assessment & Plan:

## 2013-07-12 NOTE — Patient Instructions (Signed)
Follow up as needed Start the Amoxicillin twice daily- take w/ food Use the cough syrup as needed Call with any questions or concerns Hang in there!!

## 2013-07-13 ENCOUNTER — Encounter: Payer: Self-pay | Admitting: *Deleted

## 2013-07-14 ENCOUNTER — Ambulatory Visit (INDEPENDENT_AMBULATORY_CARE_PROVIDER_SITE_OTHER): Payer: PRIVATE HEALTH INSURANCE | Admitting: *Deleted

## 2013-07-14 ENCOUNTER — Encounter: Payer: Self-pay | Admitting: Internal Medicine

## 2013-07-14 DIAGNOSIS — I4891 Unspecified atrial fibrillation: Secondary | ICD-10-CM

## 2013-07-15 ENCOUNTER — Emergency Department (HOSPITAL_COMMUNITY): Payer: No Typology Code available for payment source

## 2013-07-15 ENCOUNTER — Emergency Department (HOSPITAL_COMMUNITY)
Admission: EM | Admit: 2013-07-15 | Discharge: 2013-07-15 | Disposition: A | Discharge status: Home / Self Care | Payer: No Typology Code available for payment source | Attending: Emergency Medicine | Admitting: Emergency Medicine

## 2013-07-15 ENCOUNTER — Encounter (HOSPITAL_COMMUNITY): Payer: Self-pay | Admitting: Emergency Medicine

## 2013-07-15 DIAGNOSIS — I4891 Unspecified atrial fibrillation: Secondary | ICD-10-CM | POA: Insufficient documentation

## 2013-07-15 DIAGNOSIS — G8911 Acute pain due to trauma: Secondary | ICD-10-CM | POA: Insufficient documentation

## 2013-07-15 DIAGNOSIS — I2510 Atherosclerotic heart disease of native coronary artery without angina pectoris: Secondary | ICD-10-CM | POA: Insufficient documentation

## 2013-07-15 DIAGNOSIS — M25469 Effusion, unspecified knee: Secondary | ICD-10-CM | POA: Insufficient documentation

## 2013-07-15 DIAGNOSIS — G4733 Obstructive sleep apnea (adult) (pediatric): Secondary | ICD-10-CM | POA: Insufficient documentation

## 2013-07-15 DIAGNOSIS — M25461 Effusion, right knee: Secondary | ICD-10-CM

## 2013-07-15 DIAGNOSIS — Z954 Presence of other heart-valve replacement: Secondary | ICD-10-CM | POA: Insufficient documentation

## 2013-07-15 DIAGNOSIS — Z8619 Personal history of other infectious and parasitic diseases: Secondary | ICD-10-CM | POA: Insufficient documentation

## 2013-07-15 DIAGNOSIS — I10 Essential (primary) hypertension: Secondary | ICD-10-CM | POA: Insufficient documentation

## 2013-07-15 DIAGNOSIS — Z8719 Personal history of other diseases of the digestive system: Secondary | ICD-10-CM | POA: Insufficient documentation

## 2013-07-15 DIAGNOSIS — Z9889 Other specified postprocedural states: Secondary | ICD-10-CM | POA: Insufficient documentation

## 2013-07-15 DIAGNOSIS — Z79899 Other long term (current) drug therapy: Secondary | ICD-10-CM | POA: Insufficient documentation

## 2013-07-15 DIAGNOSIS — Z7982 Long term (current) use of aspirin: Secondary | ICD-10-CM | POA: Insufficient documentation

## 2013-07-15 DIAGNOSIS — Z87828 Personal history of other (healed) physical injury and trauma: Secondary | ICD-10-CM | POA: Insufficient documentation

## 2013-07-15 DIAGNOSIS — Z8781 Personal history of (healed) traumatic fracture: Secondary | ICD-10-CM | POA: Insufficient documentation

## 2013-07-15 DIAGNOSIS — Z7901 Long term (current) use of anticoagulants: Secondary | ICD-10-CM | POA: Insufficient documentation

## 2013-07-15 MED ORDER — TRAMADOL HCL 50 MG PO TABS: 50.0000 mg | ORAL_TABLET | Freq: Four times a day (QID) | ORAL | Status: DC | PRN

## 2013-07-15 MED ORDER — TRAMADOL HCL 50 MG PO TABS
100.0000 mg | ORAL_TABLET | Freq: Once | ORAL | Status: AC
  Administered 2013-07-15: 100 mg via ORAL
  Filled 2013-07-15: qty 2
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
## 2013-07-15 NOTE — ED Notes (Signed)
Ortho tech paged  

## 2013-07-15 NOTE — ED Notes (Signed)
Pt alert, NAD, calm, interactive, resps e/u, speaking in clear complete sentences, c/o R knee pain (mid shin/calf up to above knee), primarily posterior behind knee, swelling present, skin/tissues tight and firm, compared to L knee/leg, describes as painful to move, stiff & tight, no obvious redness or heat. CMS intact, ROM decreased. Denies CP or sob.

## 2013-07-15 NOTE — ED Notes (Signed)
The pt has had a painful rt knee since he had his back surgery 8 weeks ago.  The pain has become worse for one week worse today with swelling  And more pain.  He has been seen once for the pain with an xray that did not show any abnormality .

## 2013-07-15 NOTE — ED Notes (Signed)
To xray via w/c.

## 2013-07-16 NOTE — ED Provider Notes (Signed)
Medical screening examination/treatment/procedure(s) were performed by non-physician practitioner and as supervising physician I was immediately available for consultation/collaboration.  EKG Interpretation   None         Candyce Churn, MD 07/16/13 [phone]

## 2013-07-16 NOTE — Assessment & Plan Note (Signed)
New to provider.  Cough is deep, hacking and caused Afib last night.  Given extensive cardiac hx, will start abx.  Cough meds prn.  Reviewed supportive care and red flags that should prompt return.  Pt expressed understanding and is in agreement w/ plan.

## 2013-07-17 ENCOUNTER — Other Ambulatory Visit: Payer: Self-pay | Admitting: Orthopedic Surgery

## 2013-07-17 DIAGNOSIS — M25561 Pain in right knee: Secondary | ICD-10-CM

## 2013-07-18 ENCOUNTER — Ambulatory Visit (INDEPENDENT_AMBULATORY_CARE_PROVIDER_SITE_OTHER): Payer: No Typology Code available for payment source | Admitting: *Deleted

## 2013-07-18 ENCOUNTER — Encounter: Payer: Self-pay | Admitting: Internal Medicine

## 2013-07-18 DIAGNOSIS — Z7901 Long term (current) use of anticoagulants: Secondary | ICD-10-CM

## 2013-07-18 DIAGNOSIS — I4891 Unspecified atrial fibrillation: Secondary | ICD-10-CM

## 2013-07-18 DIAGNOSIS — Z952 Presence of prosthetic heart valve: Secondary | ICD-10-CM

## 2013-07-18 DIAGNOSIS — Z954 Presence of other heart-valve replacement: Secondary | ICD-10-CM

## 2013-07-18 LAB — POCT INR: INR: 4.7

## 2013-07-19 ENCOUNTER — Other Ambulatory Visit: Payer: Self-pay | Admitting: Orthopedic Surgery

## 2013-07-19 ENCOUNTER — Telehealth: Payer: Self-pay | Admitting: Pharmacist

## 2013-07-19 ENCOUNTER — Other Ambulatory Visit: Payer: No Typology Code available for payment source

## 2013-07-19 DIAGNOSIS — M25561 Pain in right knee: Secondary | ICD-10-CM
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
## 2013-07-20 ENCOUNTER — Ambulatory Visit
Admission: RE | Admit: 2013-07-20 | Discharge: 2013-07-20 | Disposition: A | Discharge status: Home / Self Care | Payer: PRIVATE HEALTH INSURANCE | Source: Ambulatory Visit | Attending: Orthopedic Surgery | Admitting: Orthopedic Surgery

## 2013-07-20 ENCOUNTER — Other Ambulatory Visit: Payer: No Typology Code available for payment source

## 2013-07-20 DIAGNOSIS — M25561 Pain in right knee: Secondary | ICD-10-CM

## 2013-07-21 LAB — MDC_IDC_ENUM_SESS_TYPE_REMOTE

## 2013-07-25 ENCOUNTER — Encounter: Payer: Self-pay | Admitting: Internal Medicine

## 2013-07-26 ENCOUNTER — Ambulatory Visit (INDEPENDENT_AMBULATORY_CARE_PROVIDER_SITE_OTHER): Payer: No Typology Code available for payment source | Admitting: *Deleted

## 2013-07-26 DIAGNOSIS — Z7901 Long term (current) use of anticoagulants: Secondary | ICD-10-CM

## 2013-07-26 DIAGNOSIS — Z952 Presence of prosthetic heart valve: Secondary | ICD-10-CM

## 2013-07-26 DIAGNOSIS — R509 Fever, unspecified: Secondary | ICD-10-CM

## 2013-07-26 DIAGNOSIS — Z954 Presence of other heart-valve replacement: Secondary | ICD-10-CM

## 2013-07-26 DIAGNOSIS — I4891 Unspecified atrial fibrillation: Secondary | ICD-10-CM

## 2013-07-26 LAB — SEDIMENTATION RATE: Sed Rate: 97 mm/hr — ABNORMAL HIGH (ref 0–16)

## 2013-07-26 LAB — POCT INR: INR: 3.7

## 2013-07-31 ENCOUNTER — Ambulatory Visit (HOSPITAL_BASED_OUTPATIENT_CLINIC_OR_DEPARTMENT_OTHER): Payer: No Typology Code available for payment source | Attending: Pulmonary Disease

## 2013-08-01 LAB — CULTURE, BLOOD (SINGLE)
Organism ID, Bacteria: NO GROWTH
Organism ID, Bacteria: NO GROWTH

## 2013-08-02 ENCOUNTER — Encounter: Payer: Self-pay | Admitting: Internal Medicine

## 2013-08-08 ENCOUNTER — Ambulatory Visit (INDEPENDENT_AMBULATORY_CARE_PROVIDER_SITE_OTHER): Payer: No Typology Code available for payment source | Admitting: Pharmacist

## 2013-08-08 DIAGNOSIS — I4891 Unspecified atrial fibrillation: Secondary | ICD-10-CM

## 2013-08-08 DIAGNOSIS — Z952 Presence of prosthetic heart valve: Secondary | ICD-10-CM

## 2013-08-08 DIAGNOSIS — Z954 Presence of other heart-valve replacement: Secondary | ICD-10-CM

## 2013-08-08 DIAGNOSIS — Z7901 Long term (current) use of anticoagulants: Secondary | ICD-10-CM

## 2013-08-08 LAB — POCT INR: INR: 3.7

## 2013-08-14 ENCOUNTER — Ambulatory Visit (INDEPENDENT_AMBULATORY_CARE_PROVIDER_SITE_OTHER): Payer: No Typology Code available for payment source | Admitting: Internal Medicine

## 2013-08-14 ENCOUNTER — Encounter: Payer: Self-pay | Admitting: Internal Medicine

## 2013-08-14 ENCOUNTER — Other Ambulatory Visit: Payer: Self-pay | Admitting: Cardiology

## 2013-08-14 VITALS — BP 114/80 | HR 68 | Ht 72.0 in | Wt 230.6 lb

## 2013-08-14 DIAGNOSIS — R002 Palpitations: Secondary | ICD-10-CM

## 2013-08-14 DIAGNOSIS — Z952 Presence of prosthetic heart valve: Secondary | ICD-10-CM

## 2013-08-14 DIAGNOSIS — I4891 Unspecified atrial fibrillation: Secondary | ICD-10-CM

## 2013-08-14 DIAGNOSIS — Z954 Presence of other heart-valve replacement: Secondary | ICD-10-CM

## 2013-08-14 LAB — MDC_IDC_ENUM_SESS_TYPE_INCLINIC
Date Time Interrogation Session: 20141215131123
Zone Setting Detection Interval: 2000 ms
Zone Setting Detection Interval: 330 ms
Zone Setting Detection Interval: 4500 ms

## 2013-08-14 NOTE — Patient Instructions (Addendum)
Please remember to record symptoms as you have them. Remember that your transmitter will continue to monitor you nightly and will send alert messages to the office overnight.  Your physician recommends that you continue on your current medications as directed. Please refer to the Current Medication list given to you today.  Your physician wants you to follow-up in: 6 months with Dr. Johney Frame.  You will receive a reminder letter in the mail two months in advance. If you don't receive a letter, please call our office to schedule the follow-up appointment.
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
## 2013-08-18 ENCOUNTER — Ambulatory Visit (INDEPENDENT_AMBULATORY_CARE_PROVIDER_SITE_OTHER): Payer: No Typology Code available for payment source | Admitting: *Deleted

## 2013-08-18 DIAGNOSIS — I4891 Unspecified atrial fibrillation: Secondary | ICD-10-CM

## 2013-08-19 LAB — MDC_IDC_ENUM_SESS_TYPE_REMOTE

## 2013-08-22 ENCOUNTER — Other Ambulatory Visit: Payer: Self-pay | Admitting: *Deleted

## 2013-08-22 ENCOUNTER — Ambulatory Visit (INDEPENDENT_AMBULATORY_CARE_PROVIDER_SITE_OTHER): Payer: No Typology Code available for payment source | Admitting: *Deleted

## 2013-08-22 DIAGNOSIS — I4891 Unspecified atrial fibrillation: Secondary | ICD-10-CM

## 2013-08-22 DIAGNOSIS — Z7901 Long term (current) use of anticoagulants: Secondary | ICD-10-CM

## 2013-08-22 DIAGNOSIS — Z952 Presence of prosthetic heart valve: Secondary | ICD-10-CM

## 2013-08-22 DIAGNOSIS — Z954 Presence of other heart-valve replacement: Secondary | ICD-10-CM

## 2013-08-22 LAB — POCT INR: INR: 5.2

## 2013-08-22 MED ORDER — PANTOPRAZOLE SODIUM 40 MG PO TBEC: 40.0000 mg | DELAYED_RELEASE_TABLET | Freq: Every day | ORAL | Status: DC

## 2013-08-23 ENCOUNTER — Encounter: Payer: Self-pay | Admitting: Internal Medicine

## 2013-08-29 ENCOUNTER — Other Ambulatory Visit: Payer: Self-pay | Admitting: *Deleted

## 2013-08-29 DIAGNOSIS — I712 Thoracic aortic aneurysm, without rupture: Secondary | ICD-10-CM

## 2013-09-05 ENCOUNTER — Ambulatory Visit (INDEPENDENT_AMBULATORY_CARE_PROVIDER_SITE_OTHER): Payer: PRIVATE HEALTH INSURANCE | Admitting: Pharmacist

## 2013-09-05 DIAGNOSIS — Z7901 Long term (current) use of anticoagulants: Secondary | ICD-10-CM

## 2013-09-05 DIAGNOSIS — Z954 Presence of other heart-valve replacement: Secondary | ICD-10-CM

## 2013-09-05 DIAGNOSIS — Z952 Presence of prosthetic heart valve: Secondary | ICD-10-CM

## 2013-09-05 DIAGNOSIS — I4891 Unspecified atrial fibrillation: Secondary | ICD-10-CM

## 2013-09-05 LAB — POCT INR: INR: 2.9

## 2013-09-09 ENCOUNTER — Encounter (HOSPITAL_COMMUNITY): Payer: Self-pay | Admitting: Emergency Medicine

## 2013-09-09 ENCOUNTER — Emergency Department (HOSPITAL_COMMUNITY): Payer: No Typology Code available for payment source

## 2013-09-09 DIAGNOSIS — I359 Nonrheumatic aortic valve disorder, unspecified: Secondary | ICD-10-CM | POA: Insufficient documentation

## 2013-09-09 DIAGNOSIS — Z885 Allergy status to narcotic agent status: Secondary | ICD-10-CM | POA: Insufficient documentation

## 2013-09-09 DIAGNOSIS — Z7982 Long term (current) use of aspirin: Secondary | ICD-10-CM | POA: Insufficient documentation

## 2013-09-09 DIAGNOSIS — I4891 Unspecified atrial fibrillation: Secondary | ICD-10-CM | POA: Insufficient documentation

## 2013-09-09 DIAGNOSIS — I712 Thoracic aortic aneurysm, without rupture, unspecified: Secondary | ICD-10-CM | POA: Insufficient documentation

## 2013-09-09 DIAGNOSIS — I2510 Atherosclerotic heart disease of native coronary artery without angina pectoris: Secondary | ICD-10-CM | POA: Insufficient documentation

## 2013-09-09 DIAGNOSIS — Z79899 Other long term (current) drug therapy: Secondary | ICD-10-CM | POA: Insufficient documentation

## 2013-09-09 DIAGNOSIS — G4733 Obstructive sleep apnea (adult) (pediatric): Secondary | ICD-10-CM | POA: Insufficient documentation

## 2013-09-09 DIAGNOSIS — R1013 Epigastric pain: Secondary | ICD-10-CM | POA: Insufficient documentation

## 2013-09-09 DIAGNOSIS — I498 Other specified cardiac arrhythmias: Secondary | ICD-10-CM | POA: Insufficient documentation

## 2013-09-09 NOTE — ED Notes (Signed)
Pt states chest pain starting the morning and waxing a waining all day today.  Pt states taking OTC antacid at home without releif

## 2013-09-10 ENCOUNTER — Emergency Department (HOSPITAL_COMMUNITY)
Admission: EM | Admit: 2013-09-10 | Discharge: 2013-09-10 | Disposition: A | Discharge status: Home / Self Care | Payer: No Typology Code available for payment source | Attending: Emergency Medicine | Admitting: Emergency Medicine

## 2013-09-10 DIAGNOSIS — R1013 Epigastric pain: Secondary | ICD-10-CM

## 2013-09-10 LAB — PROTIME-INR
INR: 2.06 — ABNORMAL HIGH (ref 0.00–1.49)
Prothrombin Time: 22.6 seconds — ABNORMAL HIGH (ref 11.6–15.2)

## 2013-09-10 LAB — CBC
HCT: 40.6 % (ref 39.0–52.0)
Hemoglobin: 14.3 g/dL (ref 13.0–17.0)
MCH: 31.2 pg (ref 26.0–34.0)
MCHC: 35.2 g/dL (ref 30.0–36.0)
MCV: 88.5 fL (ref 78.0–100.0)
Platelets: 215 10*3/uL (ref 150–400)
RBC: 4.59 MIL/uL (ref 4.22–5.81)
RDW: 13.1 % (ref 11.5–15.5)
WBC: 8.6 10*3/uL (ref 4.0–10.5)

## 2013-09-10 LAB — BASIC METABOLIC PANEL
BUN: 18 mg/dL (ref 6–23)
CO2: 27 mEq/L (ref 19–32)
Calcium: 10 mg/dL (ref 8.4–10.5)
Chloride: 100 mEq/L (ref 96–112)
Creatinine, Ser: 0.99 mg/dL (ref 0.50–1.35)
GFR calc Af Amer: >90 mL/min (ref >90)
GFR calc non Af Amer: >90 mL/min (ref >90)
Glucose, Bld: 106 mg/dL — ABNORMAL HIGH (ref 70–99)
Potassium: 4.4 mEq/L (ref 3.7–5.3)
Sodium: 139 mEq/L (ref 137–147)

## 2013-09-10 LAB — POCT I-STAT TROPONIN I: Troponin i, poc: 0 ng/mL (ref 0.00–0.08)

## 2013-09-10 MED ORDER — GI COCKTAIL ~~LOC~~
30.0000 mL | Freq: Once | ORAL | Status: AC
  Administered 2013-09-10: 30 mL via ORAL
  Filled 2013-09-10: qty 30

## 2013-09-10 MED ORDER — SUCRALFATE 1 G PO TABS: 1.0000 g | ORAL_TABLET | Freq: Four times a day (QID) | ORAL | Status: DC
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
## 2013-09-10 NOTE — ED Notes (Signed)
Pt states understanding of discharge instructions 

## 2013-09-10 NOTE — Discharge Instructions (Signed)
Your workup today has not shown a specific cause for your pain.   Your chest xray, ekg, and labwork were normal.  Please follow up with your doctor for recheck.  Your INR was 2.09.  Discuss with your coumadin clinic recommendations for management.   Pain of Unknown Etiology (Pain Without a Known Cause) You have come to your caregiver because of pain. Pain can occur in any part of the body. Often there is not a definite cause. If your laboratory (blood or urine) work was normal and X-rays or other studies were normal, your caregiver may treat you without knowing the cause of the pain. An example of this is the headache. Most headaches are diagnosed by taking a history. This means your caregiver asks you questions about your headaches. Your caregiver determines a treatment based on your answers. Usually testing done for headaches is normal. Often testing is not done unless there is no response to medications. Regardless of where your pain is located today, you can be given medications to make you comfortable. If no physical cause of pain can be found, most cases of pain will gradually leave as suddenly as they came.  If you have a painful condition and no reason can be found for the pain, it is important that you follow up with your caregiver. If the pain becomes worse or does not go away, it may be necessary to repeat tests and look further for a possible cause.  Only take over-the-counter or prescription medicines for pain, discomfort, or fever as directed by your caregiver.  For the protection of your privacy, test results cannot be given over the phone. Make sure you receive the results of your test. Ask how these results are to be obtained if you have not been informed. It is your responsibility to obtain your test results.  You may continue all activities unless the activities cause more pain. When the pain lessens, it is important to gradually resume normal activities. Resume activities by beginning  slowly and gradually increasing the intensity and duration of the activities or exercise. During periods of severe pain, bed rest may be helpful. Lie or sit in any position that is comfortable.  Ice used for acute (sudden) conditions may be effective. Use a large plastic bag filled with ice and wrapped in a towel. This may provide pain relief.  See your caregiver for continued problems. Your caregiver can help or refer you for exercises or physical therapy if necessary. If you were given medications for your condition, do not drive, operate machinery or power tools, or sign legal documents for 24 hours. Do not drink alcohol, take sleeping pills, or take other medications that may interfere with treatment. See your caregiver immediately if you have pain that is becoming worse and not relieved by medications. Document Released: 05/12/2001 Document Revised: 06/07/2013 Document Reviewed: 08/17/2005 [facility] Patient Information 2014 [employer], [place].

## 2013-09-14 ENCOUNTER — Encounter: Payer: Self-pay | Admitting: Internal Medicine

## 2013-09-19 ENCOUNTER — Ambulatory Visit (INDEPENDENT_AMBULATORY_CARE_PROVIDER_SITE_OTHER): Payer: PRIVATE HEALTH INSURANCE | Admitting: *Deleted

## 2013-09-19 DIAGNOSIS — I4891 Unspecified atrial fibrillation: Secondary | ICD-10-CM

## 2013-09-22 ENCOUNTER — Other Ambulatory Visit: Payer: Self-pay

## 2013-09-22 ENCOUNTER — Ambulatory Visit (INDEPENDENT_AMBULATORY_CARE_PROVIDER_SITE_OTHER): Payer: PRIVATE HEALTH INSURANCE | Admitting: *Deleted

## 2013-09-22 ENCOUNTER — Ambulatory Visit
Admission: RE | Admit: 2013-09-22 | Discharge: 2013-09-22 | Disposition: A | Discharge status: Home / Self Care | Payer: PRIVATE HEALTH INSURANCE | Source: Ambulatory Visit | Attending: Thoracic Surgery (Cardiothoracic Vascular Surgery) | Admitting: Thoracic Surgery (Cardiothoracic Vascular Surgery)

## 2013-09-22 DIAGNOSIS — Z7901 Long term (current) use of anticoagulants: Secondary | ICD-10-CM

## 2013-09-22 DIAGNOSIS — I712 Thoracic aortic aneurysm, without rupture, unspecified: Secondary | ICD-10-CM

## 2013-09-22 DIAGNOSIS — Z954 Presence of other heart-valve replacement: Secondary | ICD-10-CM

## 2013-09-22 DIAGNOSIS — Z952 Presence of prosthetic heart valve: Secondary | ICD-10-CM

## 2013-09-22 DIAGNOSIS — I4891 Unspecified atrial fibrillation: Secondary | ICD-10-CM

## 2013-09-22 LAB — POCT INR: INR: 4.1

## 2013-09-22 MED ORDER — IOHEXOL 350 MG/ML SOLN
100.0000 mL | Freq: Once | INTRAVENOUS | Status: AC | PRN
  Administered 2013-09-22: 100 mL via INTRAVENOUS

## 2013-09-22 MED ORDER — LISINOPRIL 20 MG PO TABS: 20.0000 mg | ORAL_TABLET | Freq: Every day | ORAL | Status: DC

## 2013-09-22 MED ORDER — SIMVASTATIN 40 MG PO TABS: 40.0000 mg | ORAL_TABLET | Freq: Every day | ORAL | Status: DC

## 2013-09-22 MED ORDER — PANTOPRAZOLE SODIUM 40 MG PO TBEC: 40.0000 mg | DELAYED_RELEASE_TABLET | Freq: Every day | ORAL | Status: DC

## 2013-09-25 ENCOUNTER — Encounter: Payer: Self-pay | Admitting: Thoracic Surgery (Cardiothoracic Vascular Surgery)

## 2013-09-25 ENCOUNTER — Ambulatory Visit (INDEPENDENT_AMBULATORY_CARE_PROVIDER_SITE_OTHER): Payer: PRIVATE HEALTH INSURANCE | Admitting: Thoracic Surgery (Cardiothoracic Vascular Surgery)

## 2013-09-25 VITALS — BP 149/91 | HR 74 | Resp 16 | Ht 71.0 in | Wt 235.0 lb

## 2013-09-25 DIAGNOSIS — I712 Thoracic aortic aneurysm, without rupture, unspecified: Secondary | ICD-10-CM

## 2013-09-25 DIAGNOSIS — I719 Aortic aneurysm of unspecified site, without rupture: Secondary | ICD-10-CM

## 2013-09-25 DIAGNOSIS — I7121 Aneurysm of the ascending aorta, without rupture: Secondary | ICD-10-CM

## 2013-09-25 NOTE — Patient Instructions (Signed)
Patient is instructed to keep a record of his blood pressure on a daily basis   Endocarditis is a potentially serious infection of heart valves or inside lining of the heart.  It occurs more commonly in patients with diseased heart valves (such as patient's with aortic or mitral valve disease) and in patients who have undergone heart valve repair or replacement.  Certain surgical and dental procedures may put you at risk, such as dental cleaning, other dental procedures, or any surgery involving the respiratory, urinary, gastrointestinal tract, gallbladder or prostate gland.   To minimize your chances for develooping endocarditis, maintain good oral health and seek prompt medical attention for any infections involving the mouth, teeth, gums, skin or urinary tract.  Always notify your doctor or dentist about your underlying heart valve condition before having any invasive procedures. You will need to take antibiotics before certain procedures.
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
## 2013-09-26 ENCOUNTER — Encounter: Payer: Self-pay | Admitting: Internal Medicine

## 2013-09-28 ENCOUNTER — Encounter: Payer: Self-pay | Admitting: Internal Medicine

## 2013-10-04 LAB — MDC_IDC_ENUM_SESS_TYPE_REMOTE

## 2013-10-13 ENCOUNTER — Ambulatory Visit (INDEPENDENT_AMBULATORY_CARE_PROVIDER_SITE_OTHER): Payer: PRIVATE HEALTH INSURANCE | Admitting: *Deleted

## 2013-10-13 DIAGNOSIS — I4891 Unspecified atrial fibrillation: Secondary | ICD-10-CM

## 2013-10-13 DIAGNOSIS — Z7901 Long term (current) use of anticoagulants: Secondary | ICD-10-CM

## 2013-10-13 DIAGNOSIS — Z952 Presence of prosthetic heart valve: Secondary | ICD-10-CM

## 2013-10-13 DIAGNOSIS — Z954 Presence of other heart-valve replacement: Secondary | ICD-10-CM

## 2013-10-13 LAB — POCT INR: INR: 4.2

## 2013-10-19 ENCOUNTER — Encounter: Payer: Self-pay | Admitting: Internal Medicine

## 2013-10-20 ENCOUNTER — Encounter: Payer: Self-pay | Admitting: Internal Medicine

## 2013-10-20 ENCOUNTER — Ambulatory Visit (INDEPENDENT_AMBULATORY_CARE_PROVIDER_SITE_OTHER): Payer: PRIVATE HEALTH INSURANCE | Admitting: *Deleted

## 2013-10-20 DIAGNOSIS — I4891 Unspecified atrial fibrillation: Secondary | ICD-10-CM

## 2013-11-07 ENCOUNTER — Encounter (HOSPITAL_COMMUNITY): Payer: Self-pay

## 2013-11-09 ENCOUNTER — Other Ambulatory Visit (HOSPITAL_COMMUNITY): Payer: Self-pay | Admitting: Cardiology

## 2013-11-09 DIAGNOSIS — I4891 Unspecified atrial fibrillation: Secondary | ICD-10-CM

## 2013-11-15 ENCOUNTER — Ambulatory Visit (HOSPITAL_COMMUNITY)
Admission: RE | Admit: 2013-11-15 | Discharge: 2013-11-15 | Disposition: A | Discharge status: Home / Self Care | Payer: PRIVATE HEALTH INSURANCE | Source: Ambulatory Visit | Attending: Internal Medicine | Admitting: Internal Medicine

## 2013-11-15 ENCOUNTER — Ambulatory Visit (HOSPITAL_BASED_OUTPATIENT_CLINIC_OR_DEPARTMENT_OTHER)
Admission: RE | Admit: 2013-11-15 | Discharge: 2013-11-15 | Disposition: A | Discharge status: Home / Self Care | Payer: PRIVATE HEALTH INSURANCE | Source: Ambulatory Visit | Attending: Internal Medicine | Admitting: Internal Medicine

## 2013-11-15 ENCOUNTER — Encounter (HOSPITAL_COMMUNITY): Payer: Self-pay

## 2013-11-15 ENCOUNTER — Ambulatory Visit (INDEPENDENT_AMBULATORY_CARE_PROVIDER_SITE_OTHER): Payer: PRIVATE HEALTH INSURANCE | Admitting: *Deleted

## 2013-11-15 VITALS — BP 124/88 | HR 61 | Ht 71.0 in | Wt 228.0 lb

## 2013-11-15 DIAGNOSIS — E785 Hyperlipidemia, unspecified: Secondary | ICD-10-CM | POA: Insufficient documentation

## 2013-11-15 DIAGNOSIS — I4891 Unspecified atrial fibrillation: Secondary | ICD-10-CM

## 2013-11-15 DIAGNOSIS — Z79899 Other long term (current) drug therapy: Secondary | ICD-10-CM | POA: Insufficient documentation

## 2013-11-15 DIAGNOSIS — Z91199 Patient's noncompliance with other medical treatment and regimen due to unspecified reason: Secondary | ICD-10-CM | POA: Insufficient documentation

## 2013-11-15 DIAGNOSIS — E669 Obesity, unspecified: Secondary | ICD-10-CM | POA: Insufficient documentation

## 2013-11-15 DIAGNOSIS — I379 Nonrheumatic pulmonary valve disorder, unspecified: Secondary | ICD-10-CM | POA: Insufficient documentation

## 2013-11-15 DIAGNOSIS — I714 Abdominal aortic aneurysm, without rupture, unspecified: Secondary | ICD-10-CM | POA: Insufficient documentation

## 2013-11-15 DIAGNOSIS — I10 Essential (primary) hypertension: Secondary | ICD-10-CM

## 2013-11-15 DIAGNOSIS — R5383 Other fatigue: Secondary | ICD-10-CM

## 2013-11-15 DIAGNOSIS — Z954 Presence of other heart-valve replacement: Secondary | ICD-10-CM

## 2013-11-15 DIAGNOSIS — Z952 Presence of prosthetic heart valve: Secondary | ICD-10-CM

## 2013-11-15 DIAGNOSIS — I359 Nonrheumatic aortic valve disorder, unspecified: Secondary | ICD-10-CM | POA: Insufficient documentation

## 2013-11-15 DIAGNOSIS — I517 Cardiomegaly: Secondary | ICD-10-CM

## 2013-11-15 DIAGNOSIS — I712 Thoracic aortic aneurysm, without rupture: Secondary | ICD-10-CM

## 2013-11-15 DIAGNOSIS — R0609 Other forms of dyspnea: Secondary | ICD-10-CM

## 2013-11-15 DIAGNOSIS — I330 Acute and subacute infective endocarditis: Secondary | ICD-10-CM

## 2013-11-15 DIAGNOSIS — Z9119 Patient's noncompliance with other medical treatment and regimen: Secondary | ICD-10-CM | POA: Insufficient documentation

## 2013-11-15 DIAGNOSIS — R0989 Other specified symptoms and signs involving the circulatory and respiratory systems: Secondary | ICD-10-CM | POA: Insufficient documentation

## 2013-11-15 DIAGNOSIS — I719 Aortic aneurysm of unspecified site, without rupture: Secondary | ICD-10-CM

## 2013-11-15 DIAGNOSIS — Z7901 Long term (current) use of anticoagulants: Secondary | ICD-10-CM

## 2013-11-15 DIAGNOSIS — I2510 Atherosclerotic heart disease of native coronary artery without angina pectoris: Secondary | ICD-10-CM | POA: Insufficient documentation

## 2013-11-15 DIAGNOSIS — Z791 Long term (current) use of non-steroidal anti-inflammatories (NSAID): Secondary | ICD-10-CM | POA: Insufficient documentation

## 2013-11-15 DIAGNOSIS — I509 Heart failure, unspecified: Secondary | ICD-10-CM | POA: Insufficient documentation

## 2013-11-15 DIAGNOSIS — R0600 Dyspnea, unspecified: Secondary | ICD-10-CM | POA: Insufficient documentation

## 2013-11-15 DIAGNOSIS — I7121 Aneurysm of the ascending aorta, without rupture: Secondary | ICD-10-CM

## 2013-11-15 DIAGNOSIS — R5381 Other malaise: Secondary | ICD-10-CM | POA: Insufficient documentation

## 2013-11-15 DIAGNOSIS — G4733 Obstructive sleep apnea (adult) (pediatric): Secondary | ICD-10-CM | POA: Insufficient documentation

## 2013-11-15 DIAGNOSIS — Q231 Congenital insufficiency of aortic valve: Secondary | ICD-10-CM | POA: Insufficient documentation

## 2013-11-15 LAB — POCT INR: INR: 3.7

## 2013-11-15 LAB — MDC_IDC_ENUM_SESS_TYPE_REMOTE

## 2013-11-15 MED ORDER — LISINOPRIL 40 MG PO TABS: 40.0000 mg | ORAL_TABLET | Freq: Every day | ORAL | Status: DC
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
## 2013-11-15 NOTE — Addendum Note (Signed)
Encounter addended by: Noralee SpaceHeather M Tennelle Taflinger, RN on: 11/15/2013 10:26 AM<BR>     Documentation filed: Orders

## 2013-11-15 NOTE — Patient Instructions (Signed)
Increase Lisinopril to 40 mg daily  Your physician has recommended that you have a cardiopulmonary stress test (CPX). CPX testing is a non-invasive measurement of heart and lung function. It replaces a traditional treadmill stress test. This type of test provides a tremendous amount of information that relates not only to your present condition but also for future outcomes. This test combines measurements of you ventilation, respiratory gas exchange in the lungs, electrocardiogram (EKG), blood pressure and physical response before, during, and following an exercise protocol.  Check BP for 2 weeks and call with report  We will contact you in 2 months to schedule your next appointment.

## 2013-11-15 NOTE — Addendum Note (Signed)
Encounter addended by: Noralee SpaceHeather M Oluwatomisin Deman, RN on: 11/15/2013 10:25 AM<BR>     Documentation filed: Patient Instructions Section, Orders

## 2013-11-15 NOTE — Progress Notes (Signed)
  Echocardiogram 2D Echocardiogram has been performed.  Georgian CoWILLIAMS, Clemie General 11/15/2013, 8:59 AM

## 2013-11-17 NOTE — Addendum Note (Signed)
Encounter addended by: Simon RheinSandra C Laquiesha Piacente, CCT on: 11/17/2013  8:42 AM<BR>     Documentation filed: Charges VN

## 2013-11-19 ENCOUNTER — Emergency Department (HOSPITAL_COMMUNITY)
Admission: EM | Admit: 2013-11-19 | Discharge: 2013-11-20 | Disposition: A | Discharge status: Home / Self Care | Payer: No Typology Code available for payment source | Attending: Emergency Medicine | Admitting: Emergency Medicine

## 2013-11-19 ENCOUNTER — Encounter (HOSPITAL_COMMUNITY): Payer: Self-pay | Admitting: Emergency Medicine

## 2013-11-19 ENCOUNTER — Emergency Department (HOSPITAL_COMMUNITY): Payer: No Typology Code available for payment source

## 2013-11-19 ENCOUNTER — Other Ambulatory Visit: Payer: Self-pay

## 2013-11-19 DIAGNOSIS — I2510 Atherosclerotic heart disease of native coronary artery without angina pectoris: Secondary | ICD-10-CM | POA: Insufficient documentation

## 2013-11-19 DIAGNOSIS — R079 Chest pain, unspecified: Secondary | ICD-10-CM

## 2013-11-19 DIAGNOSIS — Z7901 Long term (current) use of anticoagulants: Secondary | ICD-10-CM | POA: Insufficient documentation

## 2013-11-19 DIAGNOSIS — Z954 Presence of other heart-valve replacement: Secondary | ICD-10-CM | POA: Insufficient documentation

## 2013-11-19 DIAGNOSIS — Z8619 Personal history of other infectious and parasitic diseases: Secondary | ICD-10-CM | POA: Insufficient documentation

## 2013-11-19 DIAGNOSIS — R011 Cardiac murmur, unspecified: Secondary | ICD-10-CM | POA: Insufficient documentation

## 2013-11-19 DIAGNOSIS — I10 Essential (primary) hypertension: Secondary | ICD-10-CM | POA: Insufficient documentation

## 2013-11-19 DIAGNOSIS — Z79899 Other long term (current) drug therapy: Secondary | ICD-10-CM | POA: Insufficient documentation

## 2013-11-19 DIAGNOSIS — Z8719 Personal history of other diseases of the digestive system: Secondary | ICD-10-CM | POA: Insufficient documentation

## 2013-11-19 DIAGNOSIS — Z7982 Long term (current) use of aspirin: Secondary | ICD-10-CM | POA: Insufficient documentation

## 2013-11-19 DIAGNOSIS — I4891 Unspecified atrial fibrillation: Secondary | ICD-10-CM | POA: Insufficient documentation

## 2013-11-19 DIAGNOSIS — G4733 Obstructive sleep apnea (adult) (pediatric): Secondary | ICD-10-CM | POA: Insufficient documentation

## 2013-11-19 DIAGNOSIS — Z9889 Other specified postprocedural states: Secondary | ICD-10-CM | POA: Insufficient documentation

## 2013-11-19 DIAGNOSIS — R0789 Other chest pain: Secondary | ICD-10-CM | POA: Insufficient documentation

## 2013-11-19 LAB — BASIC METABOLIC PANEL
BUN: 21 mg/dL (ref 6–23)
CO2: 24 mEq/L (ref 19–32)
Calcium: 9.8 mg/dL (ref 8.4–10.5)
Chloride: 100 mEq/L (ref 96–112)
Creatinine, Ser: 0.99 mg/dL (ref 0.50–1.35)
GFR calc Af Amer: >90 mL/min (ref >90)
GFR calc non Af Amer: >90 mL/min (ref >90)
Glucose, Bld: 141 mg/dL — ABNORMAL HIGH (ref 70–99)
Potassium: 4.5 mEq/L (ref 3.7–5.3)
Sodium: 139 mEq/L (ref 137–147)

## 2013-11-19 LAB — CBC WITH DIFFERENTIAL/PLATELET
Basophils Absolute: 0 10*3/uL (ref 0.0–0.1)
Basophils Relative: 0 % (ref 0–1)
Eosinophils Absolute: 0.1 10*3/uL (ref 0.0–0.7)
Eosinophils Relative: 1 % (ref 0–5)
HCT: 43.6 % (ref 39.0–52.0)
Hemoglobin: 15.4 g/dL (ref 13.0–17.0)
Lymphocytes Relative: 28 % (ref 12–46)
Lymphs Abs: 2.4 10*3/uL (ref 0.7–4.0)
MCH: 31.1 pg (ref 26.0–34.0)
MCHC: 35.3 g/dL (ref 30.0–36.0)
MCV: 88.1 fL (ref 78.0–100.0)
Monocytes Absolute: 0.6 10*3/uL (ref 0.1–1.0)
Monocytes Relative: 7 % (ref 3–12)
Neutro Abs: 5.6 10*3/uL (ref 1.7–7.7)
Neutrophils Relative %: 64 % (ref 43–77)
Platelets: 180 10*3/uL (ref 150–400)
RBC: 4.95 MIL/uL (ref 4.22–5.81)
RDW: 13.1 % (ref 11.5–15.5)
WBC: 8.7 10*3/uL (ref 4.0–10.5)

## 2013-11-19 LAB — I-STAT TROPONIN, ED: Troponin i, poc: 0 ng/mL (ref 0.00–0.08)

## 2013-11-19 LAB — PROTIME-INR
INR: 2.85 — ABNORMAL HIGH (ref 0.00–1.49)
Prothrombin Time: 28.9 seconds — ABNORMAL HIGH (ref 11.6–15.2)

## 2013-11-19 MED ORDER — ASPIRIN 325 MG PO TABS: 325.0000 mg | ORAL_TABLET | ORAL | Status: DC

## 2013-11-19 MED ORDER — ONDANSETRON 4 MG PO TBDP
8.0000 mg | ORAL_TABLET | Freq: Once | ORAL | Status: AC
  Administered 2013-11-19: 8 mg via ORAL
  Filled 2013-11-19: qty 2
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
## 2013-11-19 NOTE — ED Notes (Addendum)
C/o intermittant CP. Last episode at [id number]. Also mentions some nausea. Denies pain or other sx at this time. "May have had some radiation down L arm, not sure", no meds PTA. Alert, NAD, calm, interactive. CPX test scheduled for tomorrow for worsening exertional dyspnea, scheduled by Dr. Clarise CruzBenshimon.

## 2013-11-20 ENCOUNTER — Ambulatory Visit (HOSPITAL_COMMUNITY): Payer: PRIVATE HEALTH INSURANCE | Attending: Internal Medicine

## 2013-11-20 ENCOUNTER — Telehealth: Payer: Self-pay | Admitting: Pulmonary Disease

## 2013-11-20 ENCOUNTER — Ambulatory Visit (INDEPENDENT_AMBULATORY_CARE_PROVIDER_SITE_OTHER): Payer: PRIVATE HEALTH INSURANCE | Admitting: *Deleted

## 2013-11-20 DIAGNOSIS — R0600 Dyspnea, unspecified: Secondary | ICD-10-CM

## 2013-11-20 DIAGNOSIS — R0602 Shortness of breath: Secondary | ICD-10-CM

## 2013-11-20 DIAGNOSIS — R0989 Other specified symptoms and signs involving the circulatory and respiratory systems: Principal | ICD-10-CM | POA: Insufficient documentation

## 2013-11-20 DIAGNOSIS — R0609 Other forms of dyspnea: Secondary | ICD-10-CM | POA: Insufficient documentation

## 2013-11-20 DIAGNOSIS — I4891 Unspecified atrial fibrillation: Secondary | ICD-10-CM

## 2013-11-20 NOTE — Discharge Instructions (Signed)
Please read and follow all provided instructions.  Your diagnoses today include:  1. Chest pain     Tests performed today include:  An EKG of your heart - unchanged from priors  A chest x-ray - no changes  Cardiac enzymes - a blood test for heart muscle damage, no signs of heart muscle damage  Blood counts and electrolytes  INR - 2.85  Vital signs. See below for your results today.   Medications prescribed:   None  Take any prescribed medications only as directed.  Follow-up instructions: Please follow-up with your cardiologist as soon as you can for further evaluation of your symptoms. If you do not have a primary care doctor -- see below for referral information.   Return instructions:  SEEK IMMEDIATE MEDICAL ATTENTION IF:  You have severe chest pain, especially if the pain is crushing or pressure-like and spreads to the arms, back, neck, or jaw, or if you have sweating, nausea (feeling sick to your stomach), or shortness of breath. THIS IS AN EMERGENCY. Don't wait to see if the pain will go away. Get medical help at once. Call 911 or 0 (operator). DO NOT drive yourself to the hospital.   Your chest pain gets worse and does not go away with rest.   You have an attack of chest pain lasting longer than usual, despite rest and treatment with the medications your caregiver has prescribed.   You wake from sleep with chest pain or shortness of breath.  You feel dizzy or faint.  You have chest pain not typical of your usual pain for which you originally saw your caregiver.   You have any other emergent concerns regarding your health.  Additional Information: Chest pain comes from many different causes. Your caregiver has diagnosed you as having chest pain that is not specific for one problem, but does not require admission.  You are at low risk for an acute heart condition or other serious illness.   Your vital signs today were: BP 124/85   Pulse 60   Temp(Src) 98.4 F  (36.9 C) (Oral)   Resp 18   Ht 5\' 11"  (1.803 m)   Wt 228 lb (103.42 kg)   BMI 31.81 kg/m2   SpO2 98% If your blood pressure (BP) was elevated above 135/85 this visit, please have this repeated by your doctor within one month. --------------

## 2013-11-20 NOTE — Telephone Encounter (Signed)
lmtcb Mark [name] ° °

## 2013-11-20 NOTE — Telephone Encounter (Signed)
Pt has home sleep study order in [facility] already from October. He is now ready to schedule this. Please advise PCC;s thanks

## 2013-11-20 NOTE — ED Provider Notes (Signed)
Medical screening examination/treatment/procedure(s) were performed by non-physician practitioner and as supervising physician I was immediately available for consultation/collaboration.   EKG Interpretation None       Sidhant Helderman L Tyrah Broers, MD 11/20/13 2049 

## 2013-11-21 ENCOUNTER — Other Ambulatory Visit: Payer: Self-pay

## 2013-11-21 MED ORDER — WARFARIN SODIUM 10 MG PO TABS: ORAL_TABLET | ORAL | Status: DC

## 2013-11-22 NOTE — Telephone Encounter (Signed)
Have left several messages for this pt this time i left a message he will be put back in the HST orders and will be called when it is available for him to use Tobe SosSally E Ottinger

## 2013-12-06 ENCOUNTER — Encounter: Payer: Self-pay | Admitting: Internal Medicine

## 2013-12-19 ENCOUNTER — Encounter: Payer: Self-pay | Admitting: Internal Medicine

## 2013-12-19 LAB — MDC_IDC_ENUM_SESS_TYPE_REMOTE

## 2013-12-20 ENCOUNTER — Encounter: Payer: Self-pay | Admitting: Internal Medicine

## 2013-12-21 ENCOUNTER — Ambulatory Visit (INDEPENDENT_AMBULATORY_CARE_PROVIDER_SITE_OTHER): Payer: PRIVATE HEALTH INSURANCE | Admitting: *Deleted

## 2013-12-21 DIAGNOSIS — I4891 Unspecified atrial fibrillation: Secondary | ICD-10-CM

## 2013-12-21 LAB — MDC_IDC_ENUM_SESS_TYPE_REMOTE
Date Time Interrogation Session: 20150413213618
Zone Setting Detection Interval: 2000 ms
Zone Setting Detection Interval: 330 ms
Zone Setting Detection Interval: 4500 ms

## 2013-12-22 ENCOUNTER — Ambulatory Visit (INDEPENDENT_AMBULATORY_CARE_PROVIDER_SITE_OTHER): Payer: No Typology Code available for payment source | Admitting: *Deleted

## 2013-12-22 DIAGNOSIS — I4891 Unspecified atrial fibrillation: Secondary | ICD-10-CM

## 2013-12-22 DIAGNOSIS — Z952 Presence of prosthetic heart valve: Secondary | ICD-10-CM

## 2013-12-22 DIAGNOSIS — Z7901 Long term (current) use of anticoagulants: Secondary | ICD-10-CM

## 2013-12-22 DIAGNOSIS — Z954 Presence of other heart-valve replacement: Secondary | ICD-10-CM

## 2013-12-22 LAB — POCT INR: INR: 5.5

## 2013-12-25 ENCOUNTER — Ambulatory Visit (INDEPENDENT_AMBULATORY_CARE_PROVIDER_SITE_OTHER): Payer: PRIVATE HEALTH INSURANCE | Admitting: Family Medicine

## 2013-12-25 ENCOUNTER — Encounter: Payer: Self-pay | Admitting: Family Medicine

## 2013-12-25 VITALS — BP 130/90 | HR 79 | Temp 98.2°F | Resp 16 | Wt 234.4 lb

## 2013-12-25 DIAGNOSIS — J029 Acute pharyngitis, unspecified: Secondary | ICD-10-CM

## 2013-12-25 DIAGNOSIS — J069 Acute upper respiratory infection, unspecified: Secondary | ICD-10-CM

## 2013-12-25 LAB — POCT RAPID STREP A (OFFICE): Rapid Strep A Screen: NEGATIVE

## 2013-12-25 NOTE — Assessment & Plan Note (Signed)
Pt w/o evidence of bacterial infxn on PE.  Check strep- negative.  No need for abx.  Reviewed supportive care and red flags that should prompt return.  Pt expressed understanding and is in agreement w/ plan.

## 2013-12-25 NOTE — Progress Notes (Signed)
Pre visit review using our clinic review tool, if applicable. No additional management support is needed unless otherwise documented below in the visit note. 

## 2013-12-25 NOTE — Progress Notes (Signed)
   Subjective:    Patient ID: Mark [name], male    DOB: [birth year], [age] y.o.   MRN: [id number]  HPI URI- sxs started 4 days ago.  Tm '101-102'.  + sore throat, this is improving.  + nonproductive cough.  No sinus pain/pressure.  Bilateral ear pain.  No N/V/D.  + sick contacts.   Review of Systems For ROS see HPI     Objective:   Physical Exam  Vitals reviewed. Constitutional: He appears well-developed and well-nourished. No distress.  HENT:  Head: Normocephalic and atraumatic.  No TTP over sinuses + turbinate edema + PND TMs normal bilaterally  Eyes: Conjunctivae and EOM are normal. Pupils are equal, round, and reactive to light.  Neck: Normal range of motion. Neck supple.  Cardiovascular: Normal rate, regular rhythm and normal heart sounds.   Pulmonary/Chest: Effort normal and breath sounds normal. No respiratory distress. He has no wheezes.  Lymphadenopathy:    He has no cervical adenopathy.  Skin: Skin is warm and dry.          Assessment & Plan:

## 2013-12-25 NOTE — Patient Instructions (Signed)
Strep is negative- this is good news! This is most likely a viral/allergy combo that will improve w/ time Drink plenty of fluids REST! Claritin or Zyrtec for the allergy component Tylenol as needed for pain or fever Hang in there!!

## 2014-01-03 ENCOUNTER — Encounter: Payer: Self-pay | Admitting: Internal Medicine

## 2014-01-04 ENCOUNTER — Ambulatory Visit (INDEPENDENT_AMBULATORY_CARE_PROVIDER_SITE_OTHER): Payer: No Typology Code available for payment source | Admitting: *Deleted

## 2014-01-04 DIAGNOSIS — Z952 Presence of prosthetic heart valve: Secondary | ICD-10-CM

## 2014-01-04 DIAGNOSIS — I4891 Unspecified atrial fibrillation: Secondary | ICD-10-CM

## 2014-01-04 DIAGNOSIS — Z7901 Long term (current) use of anticoagulants: Secondary | ICD-10-CM

## 2014-01-04 DIAGNOSIS — Z954 Presence of other heart-valve replacement: Secondary | ICD-10-CM

## 2014-01-04 LAB — POCT INR: INR: 2.7

## 2014-01-18 ENCOUNTER — Encounter: Payer: Self-pay | Admitting: Internal Medicine

## 2014-01-19 ENCOUNTER — Ambulatory Visit (INDEPENDENT_AMBULATORY_CARE_PROVIDER_SITE_OTHER): Payer: No Typology Code available for payment source | Admitting: *Deleted

## 2014-01-19 DIAGNOSIS — Z7901 Long term (current) use of anticoagulants: Secondary | ICD-10-CM

## 2014-01-19 DIAGNOSIS — Z952 Presence of prosthetic heart valve: Secondary | ICD-10-CM

## 2014-01-19 DIAGNOSIS — I4891 Unspecified atrial fibrillation: Secondary | ICD-10-CM

## 2014-01-19 DIAGNOSIS — Z954 Presence of other heart-valve replacement: Secondary | ICD-10-CM

## 2014-01-19 LAB — POCT INR: INR: 4.8

## 2014-01-23 ENCOUNTER — Encounter: Payer: PRIVATE HEALTH INSURANCE | Admitting: *Deleted

## 2014-01-23 ENCOUNTER — Ambulatory Visit (INDEPENDENT_AMBULATORY_CARE_PROVIDER_SITE_OTHER): Payer: No Typology Code available for payment source | Admitting: *Deleted

## 2014-01-23 DIAGNOSIS — I4891 Unspecified atrial fibrillation: Secondary | ICD-10-CM

## 2014-01-23 LAB — MDC_IDC_ENUM_SESS_TYPE_REMOTE: Date Time Interrogation Session: 20150523040500

## 2014-01-24 ENCOUNTER — Encounter: Payer: Self-pay | Admitting: Internal Medicine

## 2014-01-24 ENCOUNTER — Ambulatory Visit (HOSPITAL_BASED_OUTPATIENT_CLINIC_OR_DEPARTMENT_OTHER): Payer: No Typology Code available for payment source | Attending: Pulmonary Disease

## 2014-01-29 ENCOUNTER — Other Ambulatory Visit (HOSPITAL_COMMUNITY): Payer: Self-pay

## 2014-01-29 ENCOUNTER — Other Ambulatory Visit (HOSPITAL_COMMUNITY): Payer: Self-pay | Admitting: *Deleted

## 2014-01-29 MED ORDER — SIMVASTATIN 40 MG PO TABS
40.0000 mg | ORAL_TABLET | Freq: Every day | ORAL | Status: DC
Start: 2014-01-29 — End: 2014-08-30

## 2014-01-29 MED ORDER — PANTOPRAZOLE SODIUM 40 MG PO TBEC: 40.0000 mg | DELAYED_RELEASE_TABLET | Freq: Every day | ORAL | Status: DC

## 2014-01-29 MED ORDER — LISINOPRIL 40 MG PO TABS: 40.0000 mg | ORAL_TABLET | Freq: Every day | ORAL | Status: DC

## 2014-01-31 ENCOUNTER — Encounter: Payer: Self-pay | Admitting: Family Medicine

## 2014-01-31 ENCOUNTER — Ambulatory Visit (INDEPENDENT_AMBULATORY_CARE_PROVIDER_SITE_OTHER): Payer: No Typology Code available for payment source | Admitting: Family Medicine

## 2014-01-31 VITALS — BP 120/78 | HR 80 | Temp 98.2°F | Resp 16 | Wt 233.5 lb

## 2014-01-31 DIAGNOSIS — J302 Other seasonal allergic rhinitis: Secondary | ICD-10-CM

## 2014-01-31 DIAGNOSIS — J309 Allergic rhinitis, unspecified: Secondary | ICD-10-CM

## 2014-01-31 NOTE — Assessment & Plan Note (Signed)
Pt's sore throat and cough caused by untreated seasonal allergies.  Start daily OTC antihistamine and nasal steroid.  Reviewed supportive care and red flags that should prompt return.  Pt expressed understanding and is in agreement w/ plan.

## 2014-01-31 NOTE — Progress Notes (Signed)
   Subjective:    Patient ID: Mark [name], male    DOB: [birth year], 53 y.o.   MRN: [id number]  HPI Sore throat- sxs started '7-10 days ago'.  No fevers.  No nausea.  No facial pain/pressure.  + PND.  + cough- intermittently productive.  No known sick contacts.  Not currently taking any allergy meds.   Review of Systems For ROS see HPI     Objective:   Physical Exam  Vitals reviewed. Constitutional: He appears well-developed and well-nourished. No distress.  HENT:  Head: Normocephalic and atraumatic.  No TTP over sinuses + turbinate edema + PND TMs normal bilaterally  Eyes: Conjunctivae and EOM are normal. Pupils are equal, round, and reactive to light.  Neck: Normal range of motion. Neck supple.  Cardiovascular: Normal rate, regular rhythm and normal heart sounds.   Pulmonary/Chest: Effort normal and breath sounds normal. No respiratory distress. He has no wheezes.  Lymphadenopathy:    He has no cervical adenopathy.  Skin: Skin is warm and dry.          Assessment & Plan:

## 2014-01-31 NOTE — Patient Instructions (Signed)
This is all allergy related and post-nasal drip Start daily Claritin/Zyrtec/Allegra Restart Nasacort 2 sprays each nostril daily Drink plenty of fluids Call with any questions or concerns Have a great summer!!!

## 2014-01-31 NOTE — Progress Notes (Signed)
Pre visit review using our clinic review tool, if applicable. No additional management support is needed unless otherwise documented below in the visit note. 

## 2014-02-06 ENCOUNTER — Encounter: Payer: Self-pay | Admitting: Internal Medicine

## 2014-02-09 ENCOUNTER — Ambulatory Visit (INDEPENDENT_AMBULATORY_CARE_PROVIDER_SITE_OTHER): Payer: No Typology Code available for payment source | Admitting: *Deleted

## 2014-02-09 DIAGNOSIS — Z7901 Long term (current) use of anticoagulants: Secondary | ICD-10-CM

## 2014-02-09 DIAGNOSIS — Z952 Presence of prosthetic heart valve: Secondary | ICD-10-CM

## 2014-02-09 DIAGNOSIS — I4891 Unspecified atrial fibrillation: Secondary | ICD-10-CM

## 2014-02-09 DIAGNOSIS — Z954 Presence of other heart-valve replacement: Secondary | ICD-10-CM

## 2014-02-09 LAB — POCT INR: INR: 3.6

## 2014-02-12 ENCOUNTER — Other Ambulatory Visit: Payer: Self-pay

## 2014-02-21 ENCOUNTER — Ambulatory Visit (INDEPENDENT_AMBULATORY_CARE_PROVIDER_SITE_OTHER): Payer: No Typology Code available for payment source | Admitting: Internal Medicine

## 2014-02-21 ENCOUNTER — Ambulatory Visit (INDEPENDENT_AMBULATORY_CARE_PROVIDER_SITE_OTHER): Payer: No Typology Code available for payment source | Admitting: *Deleted

## 2014-02-21 ENCOUNTER — Encounter: Payer: Self-pay | Admitting: Internal Medicine

## 2014-02-21 VITALS — BP 108/66 | HR 76 | Ht 71.0 in | Wt 232.0 lb

## 2014-02-21 DIAGNOSIS — I4891 Unspecified atrial fibrillation: Secondary | ICD-10-CM

## 2014-02-21 DIAGNOSIS — I493 Ventricular premature depolarization: Secondary | ICD-10-CM

## 2014-02-21 DIAGNOSIS — I480 Paroxysmal atrial fibrillation: Secondary | ICD-10-CM

## 2014-02-21 DIAGNOSIS — I10 Essential (primary) hypertension: Secondary | ICD-10-CM

## 2014-02-21 DIAGNOSIS — I4949 Other premature depolarization: Secondary | ICD-10-CM

## 2014-02-21 LAB — BASIC METABOLIC PANEL
BUN: 18 mg/dL (ref 6–23)
CO2: 30 mEq/L (ref 19–32)
Calcium: 9.3 mg/dL (ref 8.4–10.5)
Chloride: 105 mEq/L (ref 96–112)
Creatinine, Ser: 1.1 mg/dL (ref 0.4–1.5)
GFR: 78.38 mL/min (ref >60.00)
Glucose, Bld: 93 mg/dL (ref 70–99)
Potassium: 4.6 mEq/L (ref 3.5–5.1)
Sodium: 140 mEq/L (ref 135–145)

## 2014-02-21 LAB — MDC_IDC_ENUM_SESS_TYPE_INCLINIC
Date Time Interrogation Session: 20150624140046
Zone Setting Detection Interval: 2000 ms
Zone Setting Detection Interval: 330 ms
Zone Setting Detection Interval: 4500 ms

## 2014-02-21 LAB — CBC WITH DIFFERENTIAL/PLATELET
Basophils Absolute: 0 10*3/uL (ref 0.0–0.1)
Basophils Relative: 0.5 % (ref 0.0–3.0)
Eosinophils Absolute: 0.1 10*3/uL (ref 0.0–0.7)
Eosinophils Relative: 1.3 % (ref 0.0–5.0)
HCT: 41.6 % (ref 39.0–52.0)
Hemoglobin: 14.1 g/dL (ref 13.0–17.0)
Lymphocytes Relative: 28.9 % (ref 12.0–46.0)
Lymphs Abs: 2 10*3/uL (ref 0.7–4.0)
MCHC: 33.9 g/dL (ref 30.0–36.0)
MCV: 90.9 fl (ref 78.0–100.0)
Monocytes Absolute: 0.5 10*3/uL (ref 0.1–1.0)
Monocytes Relative: 7.6 % (ref 3.0–12.0)
Neutro Abs: 4.2 10*3/uL (ref 1.4–7.7)
Neutrophils Relative %: 61.7 % (ref 43.0–77.0)
Platelets: 183 10*3/uL (ref 150.0–400.0)
RBC: 4.58 Mil/uL (ref 4.22–5.81)
RDW: 13.4 % (ref 11.5–15.5)
WBC: 6.8 10*3/uL (ref 4.0–10.5)

## 2014-02-21 LAB — MDC_IDC_ENUM_SESS_TYPE_REMOTE: Date Time Interrogation Session: 20150624040500
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
## 2014-02-21 NOTE — Patient Instructions (Addendum)
Your physician wants you to follow-up in: 6 months with Dr Jacquiline DoeAllred You will receive a reminder letter in the mail two months in advance. If you don't receive a letter, please call our office to schedule the follow-up appointment.   Your physician recommends that you return for lab work today---BMP/MAG

## 2014-02-23 NOTE — Progress Notes (Signed)
Loop recorder 

## 2014-02-26 ENCOUNTER — Encounter: Payer: Self-pay | Admitting: Internal Medicine

## 2014-03-01 ENCOUNTER — Other Ambulatory Visit: Payer: Self-pay | Admitting: Pharmacist

## 2014-03-01 MED ORDER — WARFARIN SODIUM 10 MG PO TABS: ORAL_TABLET | ORAL | Status: DC

## 2014-03-05 ENCOUNTER — Ambulatory Visit (INDEPENDENT_AMBULATORY_CARE_PROVIDER_SITE_OTHER): Payer: No Typology Code available for payment source

## 2014-03-05 DIAGNOSIS — Z7901 Long term (current) use of anticoagulants: Secondary | ICD-10-CM

## 2014-03-05 DIAGNOSIS — Z954 Presence of other heart-valve replacement: Secondary | ICD-10-CM

## 2014-03-05 DIAGNOSIS — I4891 Unspecified atrial fibrillation: Secondary | ICD-10-CM

## 2014-03-05 DIAGNOSIS — Z952 Presence of prosthetic heart valve: Secondary | ICD-10-CM

## 2014-03-05 LAB — POCT INR: INR: 4.7

## 2014-03-05 MED ORDER — WARFARIN SODIUM 10 MG PO TABS: ORAL_TABLET | ORAL | Status: DC

## 2014-03-06 ENCOUNTER — Encounter (HOSPITAL_COMMUNITY): Payer: Self-pay | Admitting: Emergency Medicine

## 2014-03-06 ENCOUNTER — Emergency Department (HOSPITAL_COMMUNITY)
Admission: EM | Admit: 2014-03-06 | Discharge: 2014-03-06 | Disposition: A | Discharge status: Home / Self Care | Payer: No Typology Code available for payment source | Attending: Emergency Medicine | Admitting: Emergency Medicine

## 2014-03-06 DIAGNOSIS — Z8719 Personal history of other diseases of the digestive system: Secondary | ICD-10-CM | POA: Insufficient documentation

## 2014-03-06 DIAGNOSIS — IMO0002 Reserved for concepts with insufficient information to code with codable children: Secondary | ICD-10-CM | POA: Insufficient documentation

## 2014-03-06 DIAGNOSIS — I2510 Atherosclerotic heart disease of native coronary artery without angina pectoris: Secondary | ICD-10-CM | POA: Insufficient documentation

## 2014-03-06 DIAGNOSIS — I4891 Unspecified atrial fibrillation: Secondary | ICD-10-CM | POA: Insufficient documentation

## 2014-03-06 DIAGNOSIS — B029 Zoster without complications: Secondary | ICD-10-CM | POA: Insufficient documentation

## 2014-03-06 DIAGNOSIS — Z7901 Long term (current) use of anticoagulants: Secondary | ICD-10-CM | POA: Insufficient documentation

## 2014-03-06 DIAGNOSIS — Z9889 Other specified postprocedural states: Secondary | ICD-10-CM | POA: Insufficient documentation

## 2014-03-06 DIAGNOSIS — Z87828 Personal history of other (healed) physical injury and trauma: Secondary | ICD-10-CM | POA: Insufficient documentation

## 2014-03-06 DIAGNOSIS — Z7982 Long term (current) use of aspirin: Secondary | ICD-10-CM | POA: Insufficient documentation

## 2014-03-06 DIAGNOSIS — I10 Essential (primary) hypertension: Secondary | ICD-10-CM | POA: Insufficient documentation

## 2014-03-06 DIAGNOSIS — Z79899 Other long term (current) drug therapy: Secondary | ICD-10-CM | POA: Insufficient documentation

## 2014-03-06 DIAGNOSIS — Z792 Long term (current) use of antibiotics: Secondary | ICD-10-CM | POA: Insufficient documentation

## 2014-03-06 MED ORDER — PREDNISONE 20 MG PO TABS: 60.0000 mg | ORAL_TABLET | Freq: Every day | ORAL | Status: DC

## 2014-03-06 MED ORDER — OXYCODONE-ACETAMINOPHEN 7.5-325 MG PO TABS
1.0000 | ORAL_TABLET | ORAL | Status: DC | PRN
Start: 2014-03-06 — End: 2015-09-11

## 2014-03-06 MED ORDER — VALACYCLOVIR HCL 1 G PO TABS: 1000.0000 mg | ORAL_TABLET | Freq: Three times a day (TID) | ORAL | Status: DC

## 2014-03-06 MED ORDER — ONDANSETRON HCL 4 MG PO TABS: 4.0000 mg | ORAL_TABLET | Freq: Four times a day (QID) | ORAL | Status: DC

## 2014-03-06 MED ORDER — PREDNISONE 20 MG PO TABS
60.0000 mg | ORAL_TABLET | Freq: Once | ORAL | Status: AC
  Administered 2014-03-06: 60 mg via ORAL
  Filled 2014-03-06: qty 3

## 2014-03-06 MED ORDER — VALACYCLOVIR HCL 500 MG PO TABS
1000.0000 mg | ORAL_TABLET | Freq: Once | ORAL | Status: AC
  Administered 2014-03-06: 1000 mg via ORAL
  Filled 2014-03-06: qty 2

## 2014-03-06 NOTE — ED Notes (Signed)
Pt presents to department for evaluation of R hand and arm swelling. Onset last night. Rash noted to anterior forearm, pt states swelling began last night and became worse today. 9/10 pain upon arrival. Respirations unlabored. Pt is alert and oriented x4.

## 2014-03-06 NOTE — ED Notes (Signed)
PT placed in gown and in bed. Pt monitored by pulse ox, bp cuff, and 5-lead. 

## 2014-03-06 NOTE — ED Notes (Signed)
Pt ambulates with steady gait alert x4

## 2014-03-06 NOTE — ED Notes (Signed)
Dr. Beaton at the bedside. 

## 2014-03-06 NOTE — Discharge Instructions (Signed)
Shingles °Shingles (herpes zoster) is an infection that is caused by the same virus that causes chickenpox (varicella). The infection causes a painful skin rash and fluid-filled blisters, which eventually break open, crust over, and heal. It may occur in any area of the body, but it usually affects only one side of the body or face. The pain of shingles usually lasts about 1 month. However, some people with shingles may develop long-term (chronic) pain in the affected area of the body. °Shingles often occurs many years after the person had chickenpox. It is more common: °· In people older than 50 years. °· In people with weakened immune systems, such as those with HIV, AIDS, or cancer. °· In people taking medicines that weaken the immune system, such as transplant medicines. °· In people under great stress. °CAUSES  °Shingles is caused by the varicella zoster virus (VZV), which also causes chickenpox. After a person is infected with the virus, it can remain in the person's body for years in an inactive state (dormant). To cause shingles, the virus reactivates and breaks out as an infection in a nerve root. °The virus can be spread from person to person (contagious) through contact with open blisters of the shingles rash. It will only spread to people who have not had chickenpox. When these people are exposed to the virus, they may develop chickenpox. They will not develop shingles. Once the blisters scab over, the person is no longer contagious and cannot spread the virus to others. °SYMPTOMS  °Shingles shows up in stages. The initial symptoms may be pain, itching, and tingling in an area of the skin. This pain is usually described as burning, stabbing, or throbbing. In a few days or weeks, a painful red rash will appear in the area where the pain, itching, and tingling were felt. The rash is usually on one side of the body in a band or belt-like pattern. Then, the rash usually turns into fluid-filled blisters. They  will scab over and dry up in approximately 2-3 weeks. °Flu-like symptoms may also occur with the initial symptoms, the rash, or the blisters. These may include: °· Fever. °· Chills. °· Headache. °· Upset stomach. °DIAGNOSIS  °Your caregiver will perform a skin exam to diagnose shingles. Skin scrapings or fluid samples may also be taken from the blisters. This sample will be examined under a microscope or sent to a lab for further testing. °TREATMENT  °There is no specific cure for shingles. Your caregiver will likely prescribe medicines to help you manage the pain, recover faster, and avoid long-term problems. This may include antiviral drugs, anti-inflammatory drugs, and pain medicines. °HOME CARE INSTRUCTIONS  °· Take a cool bath or apply cool compresses to the area of the rash or blisters as directed. This may help with the pain and itching.   °· Only take over-the-counter or prescription medicines as directed by your caregiver.   °· Rest as directed by your caregiver. °· Keep your rash and blisters clean with mild soap and cool water or as directed by your caregiver.  °· Do not pick your blisters or scratch your rash. Apply an anti-itch cream or numbing creams to the affected area as directed by your caregiver. °· Keep your shingles rash covered with a loose bandage (dressing). °· Avoid skin contact with: °¨ Babies.   °¨ Pregnant women.   °¨ Children with eczema.   °¨ Elderly people with transplants.   °¨ People with chronic illnesses, such as leukemia or AIDS.   °· Wear loose-fitting clothing to help ease the   pain of material rubbing against the rash. °· Keep all follow-up appointments with your caregiver. If the area involved is on your face, you may receive a referral for follow-up to a specialist, such as an eye doctor (ophthalmologist) or an ear, nose, and throat (ENT) doctor. Keeping all follow-up appointments will help you avoid eye complications, chronic pain, or disability.   °SEEK IMMEDIATE MEDICAL  CARE IF:  °· You have facial pain, pain around the eye area, or loss of feeling on one side of your face. °· You have ear pain or ringing in your ear. °· You have loss of taste. °· Your pain is not relieved with prescribed medicines.   °· Your redness or swelling spreads.   °· You have more pain and swelling.  °· Your condition is worsening or has changed.   °· You have a fever or persistent symptoms for more than 2-3 days. °· You have a fever and your symptoms suddenly get worse. °MAKE SURE YOU: °· Understand these instructions. °· Will watch your condition. °· Will get help right away if you are not doing well or get worse. °Document Released: 08/17/2005 Document Revised: 05/11/2012 Document Reviewed: 03/31/2012 °[employer]® Patient Information ©2015 [employer]. This information is not intended to replace advice given to you by your health care provider. Make sure you discuss any questions you have with your health care provider. ° °
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
## 2014-03-06 NOTE — ED Notes (Signed)
MD at bedside for ultrasound

## 2014-03-06 NOTE — ED Notes (Signed)
Pharmacy aware of medication order

## 2014-03-07 ENCOUNTER — Ambulatory Visit: Payer: No Typology Code available for payment source | Admitting: Medical

## 2014-03-09 ENCOUNTER — Other Ambulatory Visit: Payer: Self-pay

## 2014-03-09 MED ORDER — WARFARIN SODIUM 10 MG PO TABS: ORAL_TABLET | ORAL | Status: DC

## 2014-03-12 ENCOUNTER — Telehealth (HOSPITAL_COMMUNITY): Payer: Self-pay | Admitting: Vascular Surgery

## 2014-03-12 ENCOUNTER — Telehealth: Payer: Self-pay | Admitting: Internal Medicine

## 2014-03-12 ENCOUNTER — Ambulatory Visit (INDEPENDENT_AMBULATORY_CARE_PROVIDER_SITE_OTHER): Payer: No Typology Code available for payment source | Admitting: Family Medicine

## 2014-03-12 ENCOUNTER — Encounter: Payer: Self-pay | Admitting: Family Medicine

## 2014-03-12 VITALS — BP 118/88 | HR 74 | Temp 98.1°F | Resp 16 | Wt 233.0 lb

## 2014-03-12 DIAGNOSIS — I808 Phlebitis and thrombophlebitis of other sites: Secondary | ICD-10-CM

## 2014-03-12 MED ORDER — DOXYCYCLINE HYCLATE 100 MG PO TABS: 100.0000 mg | ORAL_TABLET | Freq: Two times a day (BID) | ORAL | Status: DC
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
## 2014-03-12 NOTE — Telephone Encounter (Signed)
Mark [name]... Pt left message needing to talk to you about resent visit to PCP... Please advise

## 2014-03-12 NOTE — Telephone Encounter (Signed)
Patient called in to make your aware that he is taking antibiotic Doxycycline. Please call and advise.

## 2014-03-12 NOTE — Progress Notes (Signed)
Pre visit review using our clinic review tool, if applicable. No additional management support is needed unless otherwise documented below in the visit note. 

## 2014-03-12 NOTE — Telephone Encounter (Signed)
Returned call to pt started on Doxycycline 100mg  BID today 03/12/14.  Advised pt this medication could potentially increase the effects of Coumadin, made sooner appt for recheck INR on 03/16/14.  Pt verbalized understanding.

## 2014-03-12 NOTE — Patient Instructions (Signed)
Start the Doxycycline at lunch- goal is 2 doses today. This will likely impact your Coumadin levels and you should have this rechecked at the end of the week We will call you this afternoon w/ your vascular appt [facility]! Call with any questions or concerns Hang in there!!

## 2014-03-13 ENCOUNTER — Ambulatory Visit (HOSPITAL_COMMUNITY)
Admission: RE | Admit: 2014-03-13 | Discharge: 2014-03-13 | Disposition: A | Discharge status: Home / Self Care | Payer: No Typology Code available for payment source | Source: Ambulatory Visit | Attending: Vascular Surgery | Admitting: Vascular Surgery

## 2014-03-13 ENCOUNTER — Other Ambulatory Visit: Payer: Self-pay | Admitting: Vascular Surgery

## 2014-03-13 ENCOUNTER — Ambulatory Visit (INDEPENDENT_AMBULATORY_CARE_PROVIDER_SITE_OTHER): Payer: No Typology Code available for payment source | Admitting: Vascular Surgery

## 2014-03-13 ENCOUNTER — Other Ambulatory Visit: Payer: Self-pay | Admitting: *Deleted

## 2014-03-13 ENCOUNTER — Encounter: Payer: Self-pay | Admitting: Vascular Surgery

## 2014-03-13 ENCOUNTER — Telehealth: Payer: Self-pay | Admitting: Vascular Surgery

## 2014-03-13 VITALS — BP 122/84 | HR 62 | Temp 98.3°F | Resp 16 | Ht 70.0 in | Wt 233.0 lb

## 2014-03-13 DIAGNOSIS — I721 Aneurysm of artery of upper extremity: Secondary | ICD-10-CM

## 2014-03-13 DIAGNOSIS — M7989 Other specified soft tissue disorders: Secondary | ICD-10-CM

## 2014-03-13 DIAGNOSIS — Y832 Surgical operation with anastomosis, bypass or graft as the cause of abnormal reaction of the patient, or of later complication, without mention of misadventure at the time of the procedure: Secondary | ICD-10-CM | POA: Insufficient documentation

## 2014-03-13 DIAGNOSIS — T82898A Other specified complication of vascular prosthetic devices, implants and grafts, initial encounter: Secondary | ICD-10-CM

## 2014-03-13 DIAGNOSIS — M25561 Pain in right knee: Secondary | ICD-10-CM

## 2014-03-13 DIAGNOSIS — Y9210 Unspecified residential institution as the place of occurrence of the external cause: Secondary | ICD-10-CM | POA: Insufficient documentation

## 2014-03-13 NOTE — Telephone Encounter (Signed)
Left message to call back  

## 2014-03-13 NOTE — Telephone Encounter (Signed)
Message copied by Fredrich BirksMILLIKAN, DANA P on Tue Mar 13, 2014 10:41 AM ------      Message from: Lorin MercyMCCHESNEY, MARILYN K      Created: Mon Mar 12, 2014  3:59 PM      Regarding: Schedule       Genia Delana,  FYI      ----- Message -----         From: Fransisco HertzBrian L Chen, MD         Sent: 03/12/2014   3:09 PM           To: [facility] Charge Pool            Wendie Agrestelexander Rahal      [id number]      [birth year]            Try to get this patient into clinic ASAP per PCP request       ------

## 2014-03-13 NOTE — Assessment & Plan Note (Signed)
New.  Pt's current sxs and PE very concerning for possible re-infection of RUE vascular graft.  Will start Doxy in setting of possible infection and attempt to get urgent vascular evaluation.  Discussed possibility of outpt doppler but even if clot is present, pt is already on Coumadin and I am unable to further treat.  Reviewed w/ pt that if he develops fever, streaking redness, or other concerns- he must immediately go to ER.  Pt in agreement.
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
## 2014-03-16 ENCOUNTER — Other Ambulatory Visit (HOSPITAL_COMMUNITY): Payer: No Typology Code available for payment source

## 2014-03-16 ENCOUNTER — Ambulatory Visit (INDEPENDENT_AMBULATORY_CARE_PROVIDER_SITE_OTHER): Payer: No Typology Code available for payment source | Admitting: *Deleted

## 2014-03-16 ENCOUNTER — Encounter: Payer: No Typology Code available for payment source | Admitting: Vascular Surgery

## 2014-03-16 DIAGNOSIS — Z952 Presence of prosthetic heart valve: Secondary | ICD-10-CM

## 2014-03-16 DIAGNOSIS — I4891 Unspecified atrial fibrillation: Secondary | ICD-10-CM

## 2014-03-16 DIAGNOSIS — Z7901 Long term (current) use of anticoagulants: Secondary | ICD-10-CM

## 2014-03-16 DIAGNOSIS — Z954 Presence of other heart-valve replacement: Secondary | ICD-10-CM

## 2014-03-16 LAB — POCT INR: INR: 4.9

## 2014-03-16 NOTE — Telephone Encounter (Signed)
Spoke w/pt he wanted to let us know he was seen by [facility] for aneurysmal dilatation of right arm, this is going to be monitored again in 6 months

## 2014-03-22 ENCOUNTER — Ambulatory Visit (INDEPENDENT_AMBULATORY_CARE_PROVIDER_SITE_OTHER): Payer: PRIVATE HEALTH INSURANCE | Admitting: *Deleted

## 2014-03-22 DIAGNOSIS — I4891 Unspecified atrial fibrillation: Secondary | ICD-10-CM

## 2014-03-22 DIAGNOSIS — I480 Paroxysmal atrial fibrillation: Secondary | ICD-10-CM

## 2014-03-23 LAB — MDC_IDC_ENUM_SESS_TYPE_REMOTE
Date Time Interrogation Session: 20150728015127
Zone Setting Detection Interval: 2000 ms
Zone Setting Detection Interval: 330 ms
Zone Setting Detection Interval: 4500 ms

## 2014-03-28 NOTE — Progress Notes (Signed)
Loop recorder 

## 2014-03-30 ENCOUNTER — Ambulatory Visit (INDEPENDENT_AMBULATORY_CARE_PROVIDER_SITE_OTHER): Payer: No Typology Code available for payment source | Admitting: Pharmacist

## 2014-03-30 DIAGNOSIS — I4891 Unspecified atrial fibrillation: Secondary | ICD-10-CM

## 2014-03-30 DIAGNOSIS — Z952 Presence of prosthetic heart valve: Secondary | ICD-10-CM

## 2014-03-30 DIAGNOSIS — Z7901 Long term (current) use of anticoagulants: Secondary | ICD-10-CM

## 2014-03-30 DIAGNOSIS — Z954 Presence of other heart-valve replacement: Secondary | ICD-10-CM

## 2014-03-30 LAB — POCT INR: INR: 6.7

## 2014-03-30 LAB — PROTIME-INR
INR: 6 ratio (ref 0.8–1.0)
Prothrombin Time: 63 s (ref 9.6–13.1)

## 2014-04-09 ENCOUNTER — Ambulatory Visit (INDEPENDENT_AMBULATORY_CARE_PROVIDER_SITE_OTHER): Payer: No Typology Code available for payment source | Admitting: Pharmacist

## 2014-04-09 DIAGNOSIS — Z954 Presence of other heart-valve replacement: Secondary | ICD-10-CM

## 2014-04-09 DIAGNOSIS — I4891 Unspecified atrial fibrillation: Secondary | ICD-10-CM

## 2014-04-09 DIAGNOSIS — Z952 Presence of prosthetic heart valve: Secondary | ICD-10-CM

## 2014-04-09 DIAGNOSIS — Z7901 Long term (current) use of anticoagulants: Secondary | ICD-10-CM

## 2014-04-09 LAB — POCT INR: INR: 3.6

## 2014-04-23 ENCOUNTER — Ambulatory Visit (INDEPENDENT_AMBULATORY_CARE_PROVIDER_SITE_OTHER): Payer: No Typology Code available for payment source | Admitting: *Deleted

## 2014-04-23 DIAGNOSIS — Z952 Presence of prosthetic heart valve: Secondary | ICD-10-CM

## 2014-04-23 DIAGNOSIS — I4891 Unspecified atrial fibrillation: Secondary | ICD-10-CM

## 2014-04-23 DIAGNOSIS — Z7901 Long term (current) use of anticoagulants: Secondary | ICD-10-CM

## 2014-04-23 DIAGNOSIS — Z954 Presence of other heart-valve replacement: Secondary | ICD-10-CM

## 2014-04-23 DIAGNOSIS — I480 Paroxysmal atrial fibrillation: Secondary | ICD-10-CM

## 2014-04-23 LAB — PROTIME-INR
INR: >10 ratio (ref 0.8–1.0)
Prothrombin Time: >120 s (ref 9.6–13.1)

## 2014-04-23 LAB — POCT INR: INR: 6.5

## 2014-04-26 ENCOUNTER — Encounter: Payer: Self-pay | Admitting: Internal Medicine

## 2014-04-26 ENCOUNTER — Ambulatory Visit (INDEPENDENT_AMBULATORY_CARE_PROVIDER_SITE_OTHER): Payer: No Typology Code available for payment source

## 2014-04-26 DIAGNOSIS — Z7901 Long term (current) use of anticoagulants: Secondary | ICD-10-CM

## 2014-04-26 DIAGNOSIS — Z954 Presence of other heart-valve replacement: Secondary | ICD-10-CM

## 2014-04-26 DIAGNOSIS — Z952 Presence of prosthetic heart valve: Secondary | ICD-10-CM

## 2014-04-26 DIAGNOSIS — I4891 Unspecified atrial fibrillation: Secondary | ICD-10-CM

## 2014-04-26 LAB — POCT INR: INR: 1.6

## 2014-04-26 NOTE — Progress Notes (Signed)
Loop recorder 

## 2014-05-04 ENCOUNTER — Encounter: Payer: Self-pay | Admitting: Internal Medicine

## 2014-05-07 ENCOUNTER — Other Ambulatory Visit: Payer: Self-pay | Admitting: Internal Medicine

## 2014-05-08 ENCOUNTER — Ambulatory Visit (INDEPENDENT_AMBULATORY_CARE_PROVIDER_SITE_OTHER)
Payer: No Typology Code available for payment source | Admitting: Pharmacist Clinician (PhC)/ Clinical Pharmacy Specialist

## 2014-05-08 DIAGNOSIS — I4891 Unspecified atrial fibrillation: Secondary | ICD-10-CM

## 2014-05-08 DIAGNOSIS — Z952 Presence of prosthetic heart valve: Secondary | ICD-10-CM

## 2014-05-08 DIAGNOSIS — Z954 Presence of other heart-valve replacement: Secondary | ICD-10-CM

## 2014-05-08 DIAGNOSIS — Z7901 Long term (current) use of anticoagulants: Secondary | ICD-10-CM

## 2014-05-08 LAB — POCT INR: INR: 4.8

## 2014-05-13 ENCOUNTER — Other Ambulatory Visit (HOSPITAL_COMMUNITY): Payer: Self-pay | Admitting: Internal Medicine

## 2014-05-15 LAB — MDC_IDC_ENUM_SESS_TYPE_REMOTE
Date Time Interrogation Session: 20150824035213
Zone Setting Detection Interval: 2000 ms
Zone Setting Detection Interval: 330 ms
Zone Setting Detection Interval: 4500 ms

## 2014-05-16 ENCOUNTER — Encounter: Payer: Self-pay | Admitting: Internal Medicine

## 2014-05-17 ENCOUNTER — Encounter: Payer: Self-pay | Admitting: Internal Medicine

## 2014-05-23 ENCOUNTER — Encounter: Payer: Self-pay | Admitting: Internal Medicine

## 2014-05-23 ENCOUNTER — Ambulatory Visit (INDEPENDENT_AMBULATORY_CARE_PROVIDER_SITE_OTHER): Payer: Self-pay | Admitting: *Deleted

## 2014-05-23 DIAGNOSIS — I480 Paroxysmal atrial fibrillation: Secondary | ICD-10-CM

## 2014-05-23 DIAGNOSIS — I4891 Unspecified atrial fibrillation: Secondary | ICD-10-CM

## 2014-05-28 ENCOUNTER — Ambulatory Visit (INDEPENDENT_AMBULATORY_CARE_PROVIDER_SITE_OTHER): Payer: No Typology Code available for payment source | Admitting: Medical

## 2014-05-28 ENCOUNTER — Encounter: Payer: Self-pay | Admitting: Medical

## 2014-05-28 VITALS — BP 123/85 | HR 65 | Temp 98.6°F | Ht 70.0 in | Wt 233.6 lb

## 2014-05-28 DIAGNOSIS — J302 Other seasonal allergic rhinitis: Secondary | ICD-10-CM

## 2014-05-28 DIAGNOSIS — H60399 Other infective otitis externa, unspecified ear: Secondary | ICD-10-CM

## 2014-05-28 DIAGNOSIS — H60392 Other infective otitis externa, left ear: Secondary | ICD-10-CM

## 2014-05-28 DIAGNOSIS — H8309 Labyrinthitis, unspecified ear: Secondary | ICD-10-CM

## 2014-05-28 DIAGNOSIS — J309 Allergic rhinitis, unspecified: Secondary | ICD-10-CM

## 2014-05-28 MED ORDER — NEOMYCIN-POLYMYXIN-HC 3.5-10000-1 OT SOLN: 3.0000 [drp] | Freq: Four times a day (QID) | OTIC | Status: DC

## 2014-05-28 MED ORDER — CEFDINIR 300 MG PO CAPS: 300.0000 mg | ORAL_CAPSULE | Freq: Two times a day (BID) | ORAL | Status: DC

## 2014-05-28 NOTE — Progress Notes (Signed)
   Subjective:    Patient ID: Mark [name], male    DOB: 1960/11/06, 53 y.o.   MRN: [id number]  HPI  Pt in with left ear pain/pressure. This has been since Saturday night. Has had occasional sneezing. No itching eyes or runny nose. No fever or chills. No sinus pressure. History ear problems even as adult.  History occasional vertigo. He has meclizine. None presently on exam. No grosss motor or sensory function deficits.    Review of Systems  Constitutional: Negative for fever, chills and fatigue.  HENT: Positive for ear pain and sneezing. Negative for congestion, nosebleeds, postnasal drip, rhinorrhea and sinus pressure.   Respiratory: Negative for cough, chest tightness, shortness of breath and wheezing.   Cardiovascular: Negative for chest pain and palpitations.  Gastrointestinal: Negative.   Musculoskeletal: Negative.   Neurological: Negative for tremors, seizures, syncope, facial asymmetry, weakness, light-headedness, numbness and headaches.       History of vertigo in the past but none acutely. He does take meclizine for this and he has a prescription.  Hematological: Negative for adenopathy. Does not bruise/bleed easily.       Objective:   Physical Exam   General  Mental Status - Alert. General Appearance - Well groomed. Not in acute distress.  Skin Rashes- No Rashes.  HEENT Head- Normal. Ear Auditory Canal - Left- mild canal swelling but he has obvious tragal tenderness on the left side upon palpation. Right - Normal.Tympanic Membrane- Left- minimal faint red streak in the Center. Right- mild dull/red. Eye Sclera/Conjunctiva- Left- Normal. Right- Normal. Nose & Sinuses Nasal Mucosa- Left- Boggy and Congested. Right-  Boggy and Congested. No frontal or maxillary sinus pressure Mouth & Throat Lips: Upper Lip- Normal: no dryness, cracking, pallor, cyanosis, or vesicular eruption. Lower Lip-Normal: no dryness, cracking, pallor, cyanosis or vesicular  eruption. Buccal Mucosa- Bilateral- No Aphthous ulcers. Oropharynx- No Discharge or Erythema. Tonsils: Characteristics- Bilateral- No Erythema or Congestion. Size/Enlargement- Bilateral- No enlargement. Discharge- bilateral-None.  Neck Neck- Supple. No Masses.   Chest and Lung Exam Auscultation: Breath Sounds:-Clear, even and unlabored. Cardiovascular Auscultation:Rythm- Regular, rate and rhythm Murmurs & Other Heart Sounds:Ausculatation of the heart reveal- No Murmurs.  Lymphatic Head & Neck General Head & Neck Lymphatics: Bilateral: Description- No Localized lymphadenopathy.  Neurologic Cranial nerves III through XII grossly intact.         Assessment & Plan:

## 2014-05-28 NOTE — Assessment & Plan Note (Signed)
He does have a history of labyrinthitis. No no current vertigo during exam but he has had some recently. He is taking a meclizine on an as-needed basis and it is helping. He has a prescription of meclizine as well.

## 2014-05-28 NOTE — Patient Instructions (Signed)
Your appear to have some otitis externa in left ear canal exhibited by your severe tragal tenderness. For this I want you start the ear drops I have prescribed.  You l have some mild nasal congestion on exam and some sneezing recent. So start nasocort for allergy component.  For your occasional vertigo, continue meclizine.  If your your left ear pain worsens despite ears drops and nasal spray then start cefdinir antibiotic.

## 2014-05-28 NOTE — Assessment & Plan Note (Signed)
He does have some obvious pain on palpation of the  Lt tragus. Canal looks a little bit swollen but not obvious. The tympanic membrane presently on the left side did not look that impressive. I want him to start off with some eardrops/Cortisporin Rx. This will be used in conjunction with nasal steroid spray. If he still has pain despite the combination of the 2 medications then he can add Cedinir. That antibiotic was sent to his pharmacy.

## 2014-05-28 NOTE — Assessment & Plan Note (Signed)
Patient will get Nasacort over-the-counter and start to use.

## 2014-05-30 LAB — MDC_IDC_ENUM_SESS_TYPE_REMOTE
Date Time Interrogation Session: 20150921022639
Zone Setting Detection Interval: 2000 ms
Zone Setting Detection Interval: 330 ms
Zone Setting Detection Interval: 4500 ms

## 2014-06-01 ENCOUNTER — Ambulatory Visit (INDEPENDENT_AMBULATORY_CARE_PROVIDER_SITE_OTHER): Payer: No Typology Code available for payment source

## 2014-06-01 DIAGNOSIS — I4891 Unspecified atrial fibrillation: Secondary | ICD-10-CM

## 2014-06-01 DIAGNOSIS — Z7901 Long term (current) use of anticoagulants: Secondary | ICD-10-CM

## 2014-06-01 DIAGNOSIS — Z954 Presence of other heart-valve replacement: Secondary | ICD-10-CM

## 2014-06-01 DIAGNOSIS — Z952 Presence of prosthetic heart valve: Secondary | ICD-10-CM

## 2014-06-01 LAB — POCT INR: INR: 3.1

## 2014-06-06 ENCOUNTER — Encounter: Payer: Self-pay | Admitting: Internal Medicine

## 2014-06-08 NOTE — Progress Notes (Signed)
Loop recorder 

## 2014-06-19 ENCOUNTER — Encounter: Payer: Self-pay | Admitting: Internal Medicine

## 2014-06-22 ENCOUNTER — Ambulatory Visit (INDEPENDENT_AMBULATORY_CARE_PROVIDER_SITE_OTHER): Payer: No Typology Code available for payment source

## 2014-06-22 ENCOUNTER — Ambulatory Visit (INDEPENDENT_AMBULATORY_CARE_PROVIDER_SITE_OTHER): Payer: Self-pay | Admitting: *Deleted

## 2014-06-22 DIAGNOSIS — Z23 Encounter for immunization: Secondary | ICD-10-CM

## 2014-06-22 DIAGNOSIS — I4891 Unspecified atrial fibrillation: Secondary | ICD-10-CM

## 2014-06-22 DIAGNOSIS — Z7901 Long term (current) use of anticoagulants: Secondary | ICD-10-CM

## 2014-06-22 DIAGNOSIS — I480 Paroxysmal atrial fibrillation: Secondary | ICD-10-CM

## 2014-06-22 DIAGNOSIS — Z952 Presence of prosthetic heart valve: Secondary | ICD-10-CM

## 2014-06-22 DIAGNOSIS — Z954 Presence of other heart-valve replacement: Secondary | ICD-10-CM

## 2014-06-22 LAB — MDC_IDC_ENUM_SESS_TYPE_REMOTE
Date Time Interrogation Session: 20151014120921
Zone Setting Detection Interval: 2000 ms
Zone Setting Detection Interval: 330 ms
Zone Setting Detection Interval: 4500 ms

## 2014-06-22 LAB — POCT INR: INR: 4.3

## 2014-06-29 NOTE — Progress Notes (Signed)
Loop recorder 

## 2014-07-05 ENCOUNTER — Encounter: Payer: Self-pay | Admitting: Internal Medicine

## 2014-07-20 ENCOUNTER — Ambulatory Visit (INDEPENDENT_AMBULATORY_CARE_PROVIDER_SITE_OTHER): Payer: Self-pay | Admitting: *Deleted

## 2014-07-20 DIAGNOSIS — I480 Paroxysmal atrial fibrillation: Secondary | ICD-10-CM

## 2014-07-24 ENCOUNTER — Ambulatory Visit (INDEPENDENT_AMBULATORY_CARE_PROVIDER_SITE_OTHER): Payer: Self-pay | Admitting: *Deleted

## 2014-07-24 DIAGNOSIS — Z952 Presence of prosthetic heart valve: Secondary | ICD-10-CM

## 2014-07-24 DIAGNOSIS — Z954 Presence of other heart-valve replacement: Secondary | ICD-10-CM

## 2014-07-24 DIAGNOSIS — Z7901 Long term (current) use of anticoagulants: Secondary | ICD-10-CM

## 2014-07-24 DIAGNOSIS — I4891 Unspecified atrial fibrillation: Secondary | ICD-10-CM

## 2014-07-24 LAB — POCT INR: INR: 3.6

## 2014-07-24 NOTE — Progress Notes (Signed)
Loop recorder 

## 2014-07-25 LAB — MDC_IDC_ENUM_SESS_TYPE_REMOTE
Date Time Interrogation Session: 20151116150357
Zone Setting Detection Interval: 2000 ms
Zone Setting Detection Interval: 330 ms
Zone Setting Detection Interval: 4500 ms

## 2014-08-02 ENCOUNTER — Encounter: Payer: Self-pay | Admitting: Internal Medicine

## 2014-08-09 ENCOUNTER — Encounter (HOSPITAL_COMMUNITY): Payer: Self-pay | Admitting: Internal Medicine

## 2014-08-17 ENCOUNTER — Other Ambulatory Visit (HOSPITAL_COMMUNITY): Payer: Self-pay | Admitting: Internal Medicine

## 2014-08-20 ENCOUNTER — Ambulatory Visit (INDEPENDENT_AMBULATORY_CARE_PROVIDER_SITE_OTHER): Payer: Self-pay | Admitting: *Deleted

## 2014-08-20 DIAGNOSIS — I480 Paroxysmal atrial fibrillation: Secondary | ICD-10-CM

## 2014-08-21 LAB — MDC_IDC_ENUM_SESS_TYPE_REMOTE
Date Time Interrogation Session: 20160110000821
Zone Setting Detection Interval: 2000 ms
Zone Setting Detection Interval: 330 ms
Zone Setting Detection Interval: 4500 ms

## 2014-08-21 NOTE — Progress Notes (Signed)
Loop recorder 

## 2014-08-28 ENCOUNTER — Ambulatory Visit (INDEPENDENT_AMBULATORY_CARE_PROVIDER_SITE_OTHER): Payer: Self-pay | Admitting: *Deleted

## 2014-08-28 DIAGNOSIS — Z7901 Long term (current) use of anticoagulants: Secondary | ICD-10-CM

## 2014-08-28 DIAGNOSIS — I4891 Unspecified atrial fibrillation: Secondary | ICD-10-CM

## 2014-08-28 DIAGNOSIS — Z954 Presence of other heart-valve replacement: Secondary | ICD-10-CM

## 2014-08-28 DIAGNOSIS — Z952 Presence of prosthetic heart valve: Secondary | ICD-10-CM

## 2014-08-28 LAB — POCT INR: INR: 4.1

## 2014-08-29 ENCOUNTER — Other Ambulatory Visit: Payer: Self-pay | Admitting: Internal Medicine

## 2014-08-30 ENCOUNTER — Other Ambulatory Visit (HOSPITAL_COMMUNITY): Payer: Self-pay | Admitting: Cardiology

## 2014-08-30 MED ORDER — SIMVASTATIN 40 MG PO TABS: 40.0000 mg | ORAL_TABLET | Freq: Every day | ORAL | Status: DC

## 2014-08-31 ENCOUNTER — Other Ambulatory Visit: Payer: Self-pay | Admitting: Internal Medicine

## 2014-09-11 ENCOUNTER — Other Ambulatory Visit (HOSPITAL_COMMUNITY): Payer: No Typology Code available for payment source

## 2014-09-11 ENCOUNTER — Ambulatory Visit: Payer: No Typology Code available for payment source | Admitting: Vascular Surgery

## 2014-09-20 ENCOUNTER — Ambulatory Visit (INDEPENDENT_AMBULATORY_CARE_PROVIDER_SITE_OTHER): Payer: Self-pay | Admitting: *Deleted

## 2014-09-20 DIAGNOSIS — I480 Paroxysmal atrial fibrillation: Secondary | ICD-10-CM

## 2014-09-20 LAB — MDC_IDC_ENUM_SESS_TYPE_REMOTE

## 2014-09-20 NOTE — Progress Notes (Signed)
Loop recorder 

## 2014-09-27 ENCOUNTER — Encounter: Payer: Self-pay | Admitting: Internal Medicine

## 2014-09-28 ENCOUNTER — Ambulatory Visit (INDEPENDENT_AMBULATORY_CARE_PROVIDER_SITE_OTHER): Payer: Self-pay | Admitting: *Deleted

## 2014-09-28 DIAGNOSIS — Z7901 Long term (current) use of anticoagulants: Secondary | ICD-10-CM

## 2014-09-28 DIAGNOSIS — Z954 Presence of other heart-valve replacement: Secondary | ICD-10-CM

## 2014-09-28 DIAGNOSIS — I4891 Unspecified atrial fibrillation: Secondary | ICD-10-CM

## 2014-09-28 DIAGNOSIS — Z952 Presence of prosthetic heart valve: Secondary | ICD-10-CM

## 2014-09-28 LAB — POCT INR: INR: 3

## 2014-10-19 ENCOUNTER — Ambulatory Visit (INDEPENDENT_AMBULATORY_CARE_PROVIDER_SITE_OTHER): Payer: Self-pay

## 2014-10-19 ENCOUNTER — Ambulatory Visit (INDEPENDENT_AMBULATORY_CARE_PROVIDER_SITE_OTHER): Payer: Self-pay | Admitting: *Deleted

## 2014-10-19 DIAGNOSIS — Z952 Presence of prosthetic heart valve: Secondary | ICD-10-CM

## 2014-10-19 DIAGNOSIS — I4891 Unspecified atrial fibrillation: Secondary | ICD-10-CM

## 2014-10-19 DIAGNOSIS — I480 Paroxysmal atrial fibrillation: Secondary | ICD-10-CM

## 2014-10-19 DIAGNOSIS — Z954 Presence of other heart-valve replacement: Secondary | ICD-10-CM

## 2014-10-19 DIAGNOSIS — Z7901 Long term (current) use of anticoagulants: Secondary | ICD-10-CM

## 2014-10-19 LAB — POCT INR: INR: 3.6

## 2014-10-19 LAB — MDC_IDC_ENUM_SESS_TYPE_REMOTE: Date Time Interrogation Session: 20160222050500

## 2014-10-22 ENCOUNTER — Encounter: Payer: Self-pay | Admitting: Vascular Surgery

## 2014-10-22 ENCOUNTER — Ambulatory Visit (INDEPENDENT_AMBULATORY_CARE_PROVIDER_SITE_OTHER): Payer: Self-pay | Admitting: *Deleted

## 2014-10-22 DIAGNOSIS — I480 Paroxysmal atrial fibrillation: Secondary | ICD-10-CM

## 2014-10-22 LAB — MDC_IDC_ENUM_SESS_TYPE_INCLINIC

## 2014-10-22 NOTE — Progress Notes (Signed)
Loop check in clinic.  Pt with 0 tachy episodes; 0 brady episodes; 0 asystole. 2 AF (previously noted) + coumadin. 2 symptomatic recordings---PVCs. Episodes were appropriate. RRT reached 10/21/14. Follow up TBD.

## 2014-10-22 NOTE — Progress Notes (Signed)
Loop recorder 

## 2014-10-23 ENCOUNTER — Other Ambulatory Visit (HOSPITAL_COMMUNITY): Payer: Self-pay

## 2014-10-23 ENCOUNTER — Encounter: Payer: Self-pay | Admitting: *Deleted

## 2014-10-23 ENCOUNTER — Ambulatory Visit: Payer: Self-pay | Admitting: Vascular Surgery

## 2014-10-25 ENCOUNTER — Encounter: Payer: Self-pay | Admitting: Internal Medicine

## 2014-10-25 ENCOUNTER — Other Ambulatory Visit (HOSPITAL_COMMUNITY): Payer: Self-pay | Admitting: Internal Medicine

## 2014-10-31 ENCOUNTER — Telehealth: Payer: Self-pay | Admitting: *Deleted

## 2014-10-31 NOTE — Telephone Encounter (Signed)
Medical record request received via mail from [employer]. Forwarded to [facility]. JG//CMA 

## 2014-11-08 ENCOUNTER — Other Ambulatory Visit: Payer: Self-pay | Admitting: Pharmacist

## 2014-11-08 MED ORDER — WARFARIN SODIUM 10 MG PO TABS: ORAL_TABLET | ORAL | Status: DC

## 2014-11-13 ENCOUNTER — Telehealth: Payer: Self-pay | Admitting: *Deleted

## 2014-11-13 ENCOUNTER — Encounter: Payer: Self-pay | Admitting: Internal Medicine

## 2014-11-16 ENCOUNTER — Ambulatory Visit (INDEPENDENT_AMBULATORY_CARE_PROVIDER_SITE_OTHER): Payer: Self-pay | Admitting: *Deleted

## 2014-11-16 DIAGNOSIS — Z7901 Long term (current) use of anticoagulants: Secondary | ICD-10-CM

## 2014-11-16 DIAGNOSIS — Z954 Presence of other heart-valve replacement: Secondary | ICD-10-CM

## 2014-11-16 DIAGNOSIS — Z952 Presence of prosthetic heart valve: Secondary | ICD-10-CM

## 2014-11-16 DIAGNOSIS — I4891 Unspecified atrial fibrillation: Secondary | ICD-10-CM

## 2014-11-16 LAB — POCT INR: INR: 3.3

## 2014-11-19 ENCOUNTER — Ambulatory Visit (INDEPENDENT_AMBULATORY_CARE_PROVIDER_SITE_OTHER): Payer: Self-pay | Admitting: *Deleted

## 2014-11-19 ENCOUNTER — Encounter: Payer: Self-pay | Admitting: Internal Medicine

## 2014-11-19 DIAGNOSIS — I480 Paroxysmal atrial fibrillation: Secondary | ICD-10-CM

## 2014-11-19 LAB — MDC_IDC_ENUM_SESS_TYPE_REMOTE
Date Time Interrogation Session: 20160326125429
Zone Setting Detection Interval: 2000 ms
Zone Setting Detection Interval: 330 ms
Zone Setting Detection Interval: 4500 ms

## 2014-11-20 ENCOUNTER — Encounter: Payer: Self-pay | Admitting: Internal Medicine

## 2014-11-23 NOTE — Progress Notes (Signed)
Loop recorder 

## 2014-11-29 ENCOUNTER — Ambulatory Visit (HOSPITAL_COMMUNITY)
Admission: RE | Admit: 2014-11-29 | Discharge: 2014-11-29 | Disposition: A | Discharge status: Home / Self Care | Payer: Self-pay | Source: Ambulatory Visit | Attending: Internal Medicine | Admitting: Internal Medicine

## 2014-11-29 ENCOUNTER — Encounter (HOSPITAL_COMMUNITY): Payer: Self-pay

## 2014-11-29 VITALS — BP 118/75 | HR 61 | Resp 18 | Wt 233.0 lb

## 2014-11-29 DIAGNOSIS — I10 Essential (primary) hypertension: Secondary | ICD-10-CM | POA: Insufficient documentation

## 2014-11-29 DIAGNOSIS — I7121 Aneurysm of the ascending aorta, without rupture: Secondary | ICD-10-CM

## 2014-11-29 DIAGNOSIS — I350 Nonrheumatic aortic (valve) stenosis: Secondary | ICD-10-CM | POA: Insufficient documentation

## 2014-11-29 DIAGNOSIS — I712 Thoracic aortic aneurysm, without rupture: Secondary | ICD-10-CM | POA: Insufficient documentation

## 2014-11-29 DIAGNOSIS — Z954 Presence of other heart-valve replacement: Secondary | ICD-10-CM | POA: Insufficient documentation

## 2014-11-29 DIAGNOSIS — I5041 Acute combined systolic (congestive) and diastolic (congestive) heart failure: Secondary | ICD-10-CM

## 2014-11-29 DIAGNOSIS — Z952 Presence of prosthetic heart valve: Secondary | ICD-10-CM

## 2014-11-29 DIAGNOSIS — I480 Paroxysmal atrial fibrillation: Secondary | ICD-10-CM

## 2014-11-29 DIAGNOSIS — I493 Ventricular premature depolarization: Secondary | ICD-10-CM

## 2014-11-29 DIAGNOSIS — I4891 Unspecified atrial fibrillation: Secondary | ICD-10-CM | POA: Insufficient documentation

## 2014-11-29 DIAGNOSIS — E785 Hyperlipidemia, unspecified: Secondary | ICD-10-CM | POA: Insufficient documentation

## 2014-11-29 MED ORDER — VERAPAMIL HCL ER 120 MG PO TBCR: 120.0000 mg | EXTENDED_RELEASE_TABLET | Freq: Every day | ORAL | Status: DC

## 2014-11-29 NOTE — Patient Instructions (Signed)
START Verapamil SR 120mg  (1 tablet) daily at bedtime.  Will set you up for an echocardiogram.  Follow up 1 year.  Do the following things EVERYDAY: 1) Weigh yourself in the morning before breakfast. Write it down and keep it in a log. 2) Take your medicines as prescribed 3) Eat low salt foods-Limit salt (sodium) to 2000 mg per day.  4) Stay as active as you can everyday 5) Limit all fluids for the day to less than 2 liters

## 2014-11-29 NOTE — Addendum Note (Signed)
Encounter addended by: Chyrl CivatteMegan G Abrina Petz, RN on: 11/29/2014  4:37 PM<BR>     Documentation filed: Visit Diagnoses, Dx Association, Patient Instructions Section, Orders
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
## 2014-12-07 ENCOUNTER — Encounter: Payer: Self-pay | Admitting: Cardiology

## 2014-12-11 ENCOUNTER — Ambulatory Visit (HOSPITAL_COMMUNITY): Payer: Self-pay

## 2014-12-13 ENCOUNTER — Encounter: Payer: Self-pay | Admitting: Cardiology

## 2014-12-14 ENCOUNTER — Ambulatory Visit (HOSPITAL_COMMUNITY)
Admission: RE | Admit: 2014-12-14 | Discharge: 2014-12-14 | Disposition: A | Discharge status: Home / Self Care | Payer: Self-pay | Source: Ambulatory Visit | Attending: Internal Medicine | Admitting: Internal Medicine

## 2014-12-14 ENCOUNTER — Ambulatory Visit (INDEPENDENT_AMBULATORY_CARE_PROVIDER_SITE_OTHER): Payer: Self-pay | Admitting: *Deleted

## 2014-12-14 DIAGNOSIS — I5041 Acute combined systolic (congestive) and diastolic (congestive) heart failure: Secondary | ICD-10-CM

## 2014-12-14 DIAGNOSIS — Z7901 Long term (current) use of anticoagulants: Secondary | ICD-10-CM

## 2014-12-14 DIAGNOSIS — I509 Heart failure, unspecified: Secondary | ICD-10-CM | POA: Insufficient documentation

## 2014-12-14 DIAGNOSIS — I4891 Unspecified atrial fibrillation: Secondary | ICD-10-CM

## 2014-12-14 DIAGNOSIS — Z954 Presence of other heart-valve replacement: Secondary | ICD-10-CM

## 2014-12-14 DIAGNOSIS — Z952 Presence of prosthetic heart valve: Secondary | ICD-10-CM

## 2014-12-14 LAB — POCT INR: INR: 4.2

## 2014-12-14 NOTE — Progress Notes (Signed)
Echocardiogram 2D Echocardiogram has been performed.  Leta JunglingCooper, Miyoko Hashimi M 12/14/2014, 8:52 AM

## 2014-12-18 ENCOUNTER — Encounter: Payer: Self-pay | Admitting: Internal Medicine

## 2014-12-19 ENCOUNTER — Ambulatory Visit (INDEPENDENT_AMBULATORY_CARE_PROVIDER_SITE_OTHER): Payer: Self-pay | Admitting: *Deleted

## 2014-12-19 DIAGNOSIS — I480 Paroxysmal atrial fibrillation: Secondary | ICD-10-CM

## 2014-12-19 NOTE — Progress Notes (Signed)
Loop recorder 

## 2014-12-20 ENCOUNTER — Encounter: Payer: Self-pay | Admitting: Cardiology

## 2015-01-11 ENCOUNTER — Telehealth: Payer: Self-pay | Admitting: Internal Medicine

## 2015-01-11 ENCOUNTER — Encounter: Payer: Self-pay | Admitting: Internal Medicine

## 2015-01-11 ENCOUNTER — Ambulatory Visit (INDEPENDENT_AMBULATORY_CARE_PROVIDER_SITE_OTHER): Payer: Self-pay | Admitting: *Deleted

## 2015-01-11 DIAGNOSIS — Z954 Presence of other heart-valve replacement: Secondary | ICD-10-CM

## 2015-01-11 DIAGNOSIS — I4891 Unspecified atrial fibrillation: Secondary | ICD-10-CM

## 2015-01-11 DIAGNOSIS — Z7901 Long term (current) use of anticoagulants: Secondary | ICD-10-CM

## 2015-01-11 DIAGNOSIS — Z952 Presence of prosthetic heart valve: Secondary | ICD-10-CM

## 2015-01-11 LAB — POCT INR: INR: 3.4

## 2015-01-11 NOTE — Telephone Encounter (Signed)
Spoke w/ pt and he said that [employer] called him and stated that a feature had been turned off and this why his monitor has not been done nightly audits. He said for it to be turned back on the office had to contact [employer]. Called [employer] and was informed due to loop recorder reaching RRT and EOS it has stopped night audits. The new algorhythim will be available soon and as soon as it is available pt will need to send manual tranmsisison and that should start the nightly audits back. Made pt aware of this. Pt verbalized understanding.

## 2015-01-11 NOTE — Telephone Encounter (Signed)
New Message   Pt was told to contact device clinic. Please call back and discuss.

## 2015-01-15 LAB — CUP PACEART REMOTE DEVICE CHECK
Date Time Interrogation Session: 20160427162322
Zone Setting Detection Interval: 2000 ms
Zone Setting Detection Interval: 330 ms
Zone Setting Detection Interval: 4500 ms

## 2015-01-18 ENCOUNTER — Ambulatory Visit (INDEPENDENT_AMBULATORY_CARE_PROVIDER_SITE_OTHER): Payer: Self-pay | Admitting: *Deleted

## 2015-01-18 DIAGNOSIS — I480 Paroxysmal atrial fibrillation: Secondary | ICD-10-CM

## 2015-01-18 NOTE — Progress Notes (Signed)
Loop recorder 

## 2015-01-31 ENCOUNTER — Encounter: Payer: Self-pay | Admitting: Internal Medicine

## 2015-01-31 LAB — CUP PACEART REMOTE DEVICE CHECK
Date Time Interrogation Session: 20160510152246
Zone Setting Detection Interval: 2000 ms
Zone Setting Detection Interval: 330 ms
Zone Setting Detection Interval: 4500 ms

## 2015-02-01 ENCOUNTER — Encounter: Payer: Self-pay | Admitting: Cardiology

## 2015-02-08 ENCOUNTER — Encounter: Payer: Self-pay | Admitting: Cardiology

## 2015-02-08 ENCOUNTER — Ambulatory Visit (INDEPENDENT_AMBULATORY_CARE_PROVIDER_SITE_OTHER): Payer: Self-pay | Admitting: *Deleted

## 2015-02-08 DIAGNOSIS — I4891 Unspecified atrial fibrillation: Secondary | ICD-10-CM

## 2015-02-08 DIAGNOSIS — Z954 Presence of other heart-valve replacement: Secondary | ICD-10-CM

## 2015-02-08 DIAGNOSIS — Z952 Presence of prosthetic heart valve: Secondary | ICD-10-CM

## 2015-02-08 DIAGNOSIS — Z7901 Long term (current) use of anticoagulants: Secondary | ICD-10-CM

## 2015-02-08 LAB — POCT INR: INR: 3.5

## 2015-02-13 ENCOUNTER — Encounter: Payer: Self-pay | Admitting: Internal Medicine

## 2015-03-11 NOTE — Telephone Encounter (Signed)
Closing encounter

## 2015-04-03 ENCOUNTER — Other Ambulatory Visit: Payer: Self-pay | Admitting: Internal Medicine

## 2015-04-12 ENCOUNTER — Ambulatory Visit (INDEPENDENT_AMBULATORY_CARE_PROVIDER_SITE_OTHER): Payer: Self-pay

## 2015-04-12 DIAGNOSIS — Z952 Presence of prosthetic heart valve: Secondary | ICD-10-CM

## 2015-04-12 DIAGNOSIS — Z7901 Long term (current) use of anticoagulants: Secondary | ICD-10-CM

## 2015-04-12 DIAGNOSIS — Z954 Presence of other heart-valve replacement: Secondary | ICD-10-CM

## 2015-04-12 DIAGNOSIS — I480 Paroxysmal atrial fibrillation: Secondary | ICD-10-CM

## 2015-04-12 DIAGNOSIS — I4891 Unspecified atrial fibrillation: Secondary | ICD-10-CM

## 2015-04-12 LAB — POCT INR: INR: 4.5

## 2015-04-24 ENCOUNTER — Other Ambulatory Visit (HOSPITAL_COMMUNITY): Payer: Self-pay | Admitting: *Deleted

## 2015-04-24 MED ORDER — LISINOPRIL 40 MG PO TABS: 20.0000 mg | ORAL_TABLET | Freq: Every day | ORAL | Status: DC

## 2015-05-15 ENCOUNTER — Telehealth: Payer: Self-pay | Admitting: *Deleted

## 2015-05-15 NOTE — Telephone Encounter (Signed)
LM for pt to call for past appt.

## 2015-06-28 ENCOUNTER — Ambulatory Visit (INDEPENDENT_AMBULATORY_CARE_PROVIDER_SITE_OTHER): Payer: Self-pay | Admitting: *Deleted

## 2015-06-28 DIAGNOSIS — Z7901 Long term (current) use of anticoagulants: Secondary | ICD-10-CM

## 2015-06-28 DIAGNOSIS — Z952 Presence of prosthetic heart valve: Secondary | ICD-10-CM

## 2015-06-28 DIAGNOSIS — I4891 Unspecified atrial fibrillation: Secondary | ICD-10-CM

## 2015-06-28 DIAGNOSIS — I480 Paroxysmal atrial fibrillation: Secondary | ICD-10-CM

## 2015-06-28 DIAGNOSIS — Z954 Presence of other heart-valve replacement: Secondary | ICD-10-CM

## 2015-06-28 LAB — POCT INR: INR: 4.4

## 2015-06-29 ENCOUNTER — Other Ambulatory Visit (HOSPITAL_COMMUNITY): Payer: Self-pay | Admitting: Internal Medicine

## 2015-08-07 ENCOUNTER — Other Ambulatory Visit: Payer: Self-pay | Admitting: Internal Medicine

## 2015-08-12 ENCOUNTER — Ambulatory Visit (INDEPENDENT_AMBULATORY_CARE_PROVIDER_SITE_OTHER): Payer: Self-pay

## 2015-08-12 DIAGNOSIS — I480 Paroxysmal atrial fibrillation: Secondary | ICD-10-CM

## 2015-08-12 DIAGNOSIS — Z7901 Long term (current) use of anticoagulants: Secondary | ICD-10-CM

## 2015-08-12 DIAGNOSIS — I4891 Unspecified atrial fibrillation: Secondary | ICD-10-CM

## 2015-08-12 DIAGNOSIS — Z952 Presence of prosthetic heart valve: Secondary | ICD-10-CM

## 2015-08-12 DIAGNOSIS — Z954 Presence of other heart-valve replacement: Secondary | ICD-10-CM

## 2015-08-12 LAB — POCT INR: INR: 2.9

## 2015-08-20 ENCOUNTER — Telehealth: Payer: Self-pay | Admitting: Internal Medicine

## 2015-08-20 ENCOUNTER — Encounter: Payer: Self-pay | Admitting: Internal Medicine

## 2015-08-20 NOTE — Telephone Encounter (Signed)
New message    Patient has question about his loop recorder

## 2015-08-20 NOTE — Telephone Encounter (Signed)
Returned patient's call.  He states that he has been having "trouble" recently and correlates this to possible atrial fibrillation.  He reports that last night his heart rate was in the 140s, but that he felt himself convert back to sinus later on.  Patient has not had his Carelink monitor plugged in since 12/2014, so no episodes have transmitted.  LINQ may have reached ? False RRT/EOS and will need software update.  Patient is aware to send a manual transmission for review tonight when he returns home.  Will review episode and call him back tomorrow.  Patient is appreciative and denies additional questions at this time.  He is aware to call with worsening symptoms, questions, or concerns.

## 2015-08-21 NOTE — Telephone Encounter (Signed)
Called patient to let him know that his manual transmission was received and that I will show Dr. [name] when he is back in the office.  Patient confirmed he is still taking warfarin and verapamil.  Patient is appreciative and denies additional questions at this time.  Advised patient to keep Carelink monitor plugged in next to his bed so that episodes can transmit automatically.  He verbalizes understanding of instructions.

## 2015-08-28 ENCOUNTER — Other Ambulatory Visit: Payer: Self-pay | Admitting: Thoracic Surgery (Cardiothoracic Vascular Surgery)

## 2015-08-28 DIAGNOSIS — I719 Aortic aneurysm of unspecified site, without rupture: Secondary | ICD-10-CM

## 2015-08-29 ENCOUNTER — Other Ambulatory Visit: Payer: Self-pay | Admitting: Thoracic Surgery (Cardiothoracic Vascular Surgery)

## 2015-08-29 DIAGNOSIS — I719 Aortic aneurysm of unspecified site, without rupture: Secondary | ICD-10-CM

## 2015-08-30 NOTE — Telephone Encounter (Signed)
Called patient to discuss scheduling appointment with Dr. [name] based on recall letter sent for 10/2015.  Patient is interested in moving appointment up if possible.  Advised patient that I will forward to scheduler and that he should expect a call from her soon.  Patient is agreeable to this plan and denies additional questions or concerns at this time.

## 2015-09-06 ENCOUNTER — Ambulatory Visit (INDEPENDENT_AMBULATORY_CARE_PROVIDER_SITE_OTHER): Payer: Self-pay | Admitting: *Deleted

## 2015-09-06 DIAGNOSIS — I480 Paroxysmal atrial fibrillation: Secondary | ICD-10-CM

## 2015-09-06 DIAGNOSIS — Z954 Presence of other heart-valve replacement: Secondary | ICD-10-CM

## 2015-09-06 DIAGNOSIS — Z952 Presence of prosthetic heart valve: Secondary | ICD-10-CM

## 2015-09-06 DIAGNOSIS — I4891 Unspecified atrial fibrillation: Secondary | ICD-10-CM

## 2015-09-06 DIAGNOSIS — Z7901 Long term (current) use of anticoagulants: Secondary | ICD-10-CM

## 2015-09-06 LAB — POCT INR: INR: 3.6

## 2015-09-08 ENCOUNTER — Other Ambulatory Visit (HOSPITAL_COMMUNITY): Payer: Self-pay | Admitting: Internal Medicine

## 2015-09-11 ENCOUNTER — Encounter: Payer: Self-pay | Admitting: Cardiology

## 2015-09-11 ENCOUNTER — Encounter: Payer: Self-pay | Admitting: Internal Medicine

## 2015-09-11 ENCOUNTER — Ambulatory Visit (INDEPENDENT_AMBULATORY_CARE_PROVIDER_SITE_OTHER): Payer: Self-pay | Admitting: Internal Medicine

## 2015-09-11 VITALS — BP 132/84 | HR 64 | Ht 70.0 in | Wt 242.2 lb

## 2015-09-11 DIAGNOSIS — G4733 Obstructive sleep apnea (adult) (pediatric): Secondary | ICD-10-CM

## 2015-09-11 DIAGNOSIS — I10 Essential (primary) hypertension: Secondary | ICD-10-CM

## 2015-09-11 LAB — CUP PACEART INCLINIC DEVICE CHECK: Date Time Interrogation Session: 20170111092002

## 2015-09-11 MED ORDER — DILTIAZEM HCL 60 MG PO TABS: ORAL_TABLET | ORAL | Status: DC

## 2015-09-11 NOTE — Patient Instructions (Signed)
Medication Instructions:  Your physician has recommended you make the following change in your medication:  1) Take Diltiazem 60 mg every 6 hours as needed for afib     Labwork: None ordered   Testing/Procedures: None ordered   Follow-Up: Your physician wants you to follow-up in: 6 months with Rudi Cocoonna Carroll, NP and Dr [name] in 12 months  You will receive a reminder letter in the mail two months in advance. If you don't receive a letter, please call our office to schedule the follow-up appointment.   Any Other Special Instructions Will Be Listed Below (If Applicable).     If you need a refill on your cardiac medications before your next appointment, please call your pharmacy.
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
## 2015-09-16 ENCOUNTER — Ambulatory Visit (INDEPENDENT_AMBULATORY_CARE_PROVIDER_SITE_OTHER): Payer: Self-pay | Admitting: *Deleted

## 2015-09-16 DIAGNOSIS — I480 Paroxysmal atrial fibrillation: Secondary | ICD-10-CM

## 2015-09-16 NOTE — Progress Notes (Signed)
Carelink Summary Report / Loop Recorder 

## 2015-09-23 ENCOUNTER — Ambulatory Visit: Payer: Self-pay | Admitting: Thoracic Surgery (Cardiothoracic Vascular Surgery)

## 2015-09-23 ENCOUNTER — Other Ambulatory Visit: Payer: Self-pay

## 2015-10-04 ENCOUNTER — Ambulatory Visit (INDEPENDENT_AMBULATORY_CARE_PROVIDER_SITE_OTHER): Payer: Self-pay | Admitting: *Deleted

## 2015-10-04 DIAGNOSIS — Z952 Presence of prosthetic heart valve: Secondary | ICD-10-CM

## 2015-10-04 DIAGNOSIS — I4891 Unspecified atrial fibrillation: Secondary | ICD-10-CM

## 2015-10-04 DIAGNOSIS — Z7901 Long term (current) use of anticoagulants: Secondary | ICD-10-CM

## 2015-10-04 DIAGNOSIS — Z954 Presence of other heart-valve replacement: Secondary | ICD-10-CM

## 2015-10-04 DIAGNOSIS — I480 Paroxysmal atrial fibrillation: Secondary | ICD-10-CM

## 2015-10-04 LAB — POCT INR: INR: 4.8

## 2015-10-15 ENCOUNTER — Ambulatory Visit (INDEPENDENT_AMBULATORY_CARE_PROVIDER_SITE_OTHER): Payer: Self-pay | Admitting: *Deleted

## 2015-10-15 DIAGNOSIS — I480 Paroxysmal atrial fibrillation: Secondary | ICD-10-CM

## 2015-10-15 NOTE — Progress Notes (Signed)
Carelink Summary Report / Loop Recorder 

## 2015-10-18 ENCOUNTER — Ambulatory Visit (INDEPENDENT_AMBULATORY_CARE_PROVIDER_SITE_OTHER): Payer: Self-pay | Admitting: Family Medicine

## 2015-10-18 ENCOUNTER — Encounter: Payer: Self-pay | Admitting: Cardiology

## 2015-10-18 ENCOUNTER — Encounter: Payer: Self-pay | Admitting: Family Medicine

## 2015-10-18 VITALS — BP 116/86 | HR 54 | Temp 98.2°F | Ht 70.0 in | Wt 239.8 lb

## 2015-10-18 DIAGNOSIS — I712 Thoracic aortic aneurysm, without rupture, unspecified: Secondary | ICD-10-CM

## 2015-10-18 DIAGNOSIS — I10 Essential (primary) hypertension: Secondary | ICD-10-CM

## 2015-10-18 DIAGNOSIS — I480 Paroxysmal atrial fibrillation: Secondary | ICD-10-CM

## 2015-10-18 DIAGNOSIS — E785 Hyperlipidemia, unspecified: Secondary | ICD-10-CM

## 2015-10-18 LAB — LIPID PANEL
Cholesterol: 255 mg/dL — ABNORMAL HIGH (ref 0–200)
HDL: 34.2 mg/dL — ABNORMAL LOW (ref >39.00)
NonHDL: 220.5
Total CHOL/HDL Ratio: 7
Triglycerides: 232 mg/dL — ABNORMAL HIGH (ref 0.0–149.0)
VLDL: 46.4 mg/dL — ABNORMAL HIGH (ref 0.0–40.0)

## 2015-10-18 LAB — CBC WITH DIFFERENTIAL/PLATELET
Basophils Absolute: 0 10*3/uL (ref 0.0–0.1)
Basophils Relative: 0.6 % (ref 0.0–3.0)
Eosinophils Absolute: 0.1 10*3/uL (ref 0.0–0.7)
Eosinophils Relative: 1.5 % (ref 0.0–5.0)
HCT: 40.1 % (ref 39.0–52.0)
Hemoglobin: 13.7 g/dL (ref 13.0–17.0)
Lymphocytes Relative: 29.6 % (ref 12.0–46.0)
Lymphs Abs: 2.1 10*3/uL (ref 0.7–4.0)
MCHC: 34.2 g/dL (ref 30.0–36.0)
MCV: 88.2 fl (ref 78.0–100.0)
Monocytes Absolute: 0.5 10*3/uL (ref 0.1–1.0)
Monocytes Relative: 6.8 % (ref 3.0–12.0)
Neutro Abs: 4.4 10*3/uL (ref 1.4–7.7)
Neutrophils Relative %: 61.5 % (ref 43.0–77.0)
Platelets: 178 10*3/uL (ref 150.0–400.0)
RBC: 4.54 Mil/uL (ref 4.22–5.81)
RDW: 13.6 % (ref 11.5–15.5)
WBC: 7.1 10*3/uL (ref 4.0–10.5)

## 2015-10-18 LAB — HEPATIC FUNCTION PANEL
ALT: 25 U/L (ref 0–53)
AST: 21 U/L (ref 0–37)
Albumin: 4.3 g/dL (ref 3.5–5.2)
Alkaline Phosphatase: 60 U/L (ref 39–117)
Bilirubin, Direct: 0.2 mg/dL (ref 0.0–0.3)
Total Bilirubin: 1.4 mg/dL — ABNORMAL HIGH (ref 0.2–1.2)
Total Protein: 7 g/dL (ref 6.0–8.3)

## 2015-10-18 LAB — BASIC METABOLIC PANEL
BUN: 22 mg/dL (ref 6–23)
CO2: 29 mEq/L (ref 19–32)
Calcium: 9.2 mg/dL (ref 8.4–10.5)
Chloride: 105 mEq/L (ref 96–112)
Creatinine, Ser: 1.04 mg/dL (ref 0.40–1.50)
GFR: 79.6 mL/min (ref >60.00)
Glucose, Bld: 97 mg/dL (ref 70–99)
Potassium: 4.5 mEq/L (ref 3.5–5.1)
Sodium: 139 mEq/L (ref 135–145)

## 2015-10-18 LAB — LDL CHOLESTEROL, DIRECT: Direct LDL: 166 mg/dL

## 2015-10-18 LAB — TSH: TSH: 2.17 u[IU]/mL (ref 0.35–4.50)

## 2015-10-18 NOTE — Assessment & Plan Note (Signed)
Chronic problem but currently controlled w/ diet and exercise.  Discussed that it may be wise to restart low dose statin for its anti-inflammatory properties.  Pt in agreement.  Will decide dose based on lab results.

## 2015-10-18 NOTE — Progress Notes (Signed)
Pre visit review using our clinic review tool, if applicable. No additional management support is needed unless otherwise documented below in the visit note. 

## 2015-10-18 NOTE — Progress Notes (Signed)
   Subjective:    Patient ID: Mark [name], male    DOB: Mar 30, 1961, 55 y.o.   MRN: [id number]  HPI HTN- chronic problem.  On Verapamil, Lisinopril.  Some intermittent CP, SOB w/ afib.  + HAs- frontal.  HAs improve w/ tylenol.  Hyperlipidemia- chronic problem.  Attempting to control w/ diet and exercise.  Not currently on a statin.    Afib- chronic problem, on coumadin.  Following w/ Dr Gala Romney.  Has Dilt PRN.  Pt continues to have runs of symptomatic tachycardia when in Afib  Aneurysm- ongoing issue, following w/ Dr [name].  Has upcoming CT scan scheduled for 2/27  Review of Systems For ROS see HPI     Objective:   Physical Exam  Constitutional: He is oriented to person, place, and time. He appears well-developed and well-nourished. No distress.  HENT:  Head: Normocephalic and atraumatic.  Eyes: Conjunctivae and EOM are normal. Pupils are equal, round, and reactive to light.  Neck: Normal range of motion. Neck supple. No thyromegaly present.  Cardiovascular: Normal rate, regular rhythm and intact distal pulses.   No murmur heard. Mechanical valve click  Pulmonary/Chest: Effort normal and breath sounds normal. No respiratory distress.  Abdominal: Soft. Bowel sounds are normal. He exhibits no distension.  Musculoskeletal: He exhibits no edema.  Lymphadenopathy:    He has no cervical adenopathy.  Neurological: He is alert and oriented to person, place, and time. No cranial nerve deficit.  Skin: Skin is warm and dry.  Psychiatric: He has a normal mood and affect. His behavior is normal.  Vitals reviewed.         Assessment & Plan:

## 2015-10-18 NOTE — Assessment & Plan Note (Signed)
Chronic problem, pt has not had recent follow up.  Has CT scan scheduled for later this month.  Stressed need for routine f/u.

## 2015-10-18 NOTE — Patient Instructions (Signed)
Schedule your complete physical in 6 months We'll notify you of your lab results and make any changes if needed We are going to seriously consider starting a low dose cholesterol medication for the anti-inflammatory properties Continue to work on healthy diet and regular exercise- you can do it!!! Call with any questions or concerns If you want to join [place] at the new [place] office, any scheduled appointments will automatically transfer and we will see you at [address] [place] [address] [place], [place], [place] [address] [facility]) Have a great weekend!!!

## 2015-10-18 NOTE — Assessment & Plan Note (Signed)
Chronic problem.  Well controlled.  Continues to be symptomatic but not from HTN, more from Afib.  Check labs.  No anticipated med changes.  Stressed importance of healthy diet, regular exercise.

## 2015-10-18 NOTE — Assessment & Plan Note (Signed)
Chronic problem.  Following w/ Dr Gala Romney.  On Coumadin.  Continues to be symptomatic w/ HR upwards of 200 at times.  Taking Dilt prn.  Now having headaches which may be related to his medication as this is new since starting the Verapamil.  Will defer any medication changes to cardiology.  HAs improve w/ tylenol but pt is taking too many (3-4 ES Tylenol)- advised he is not to take more than 2 at a time.

## 2015-10-21 ENCOUNTER — Other Ambulatory Visit: Payer: Self-pay | Admitting: Family Medicine

## 2015-10-21 ENCOUNTER — Other Ambulatory Visit: Payer: Self-pay | Admitting: General Practice

## 2015-10-21 DIAGNOSIS — E785 Hyperlipidemia, unspecified: Secondary | ICD-10-CM

## 2015-10-21 MED ORDER — ROSUVASTATIN CALCIUM 20 MG PO TABS: 20.0000 mg | ORAL_TABLET | Freq: Every day | ORAL | Status: DC

## 2015-10-25 ENCOUNTER — Ambulatory Visit (INDEPENDENT_AMBULATORY_CARE_PROVIDER_SITE_OTHER): Payer: Self-pay | Admitting: *Deleted

## 2015-10-25 DIAGNOSIS — I480 Paroxysmal atrial fibrillation: Secondary | ICD-10-CM

## 2015-10-25 DIAGNOSIS — Z952 Presence of prosthetic heart valve: Secondary | ICD-10-CM

## 2015-10-25 DIAGNOSIS — Z7901 Long term (current) use of anticoagulants: Secondary | ICD-10-CM

## 2015-10-25 DIAGNOSIS — Z954 Presence of other heart-valve replacement: Secondary | ICD-10-CM

## 2015-10-25 LAB — POCT INR: INR: 3.4

## 2015-10-28 ENCOUNTER — Ambulatory Visit (INDEPENDENT_AMBULATORY_CARE_PROVIDER_SITE_OTHER): Payer: Self-pay | Admitting: Thoracic Surgery (Cardiothoracic Vascular Surgery)

## 2015-10-28 ENCOUNTER — Ambulatory Visit
Admission: RE | Admit: 2015-10-28 | Discharge: 2015-10-28 | Disposition: A | Discharge status: Home / Self Care | Payer: No Typology Code available for payment source | Source: Ambulatory Visit | Attending: Thoracic Surgery (Cardiothoracic Vascular Surgery) | Admitting: Thoracic Surgery (Cardiothoracic Vascular Surgery)

## 2015-10-28 ENCOUNTER — Encounter: Payer: Self-pay | Admitting: Thoracic Surgery (Cardiothoracic Vascular Surgery)

## 2015-10-28 VITALS — BP 133/93 | HR 61 | Resp 16 | Ht 71.0 in | Wt 235.0 lb

## 2015-10-28 DIAGNOSIS — I712 Thoracic aortic aneurysm, without rupture, unspecified: Secondary | ICD-10-CM

## 2015-10-28 DIAGNOSIS — I719 Aortic aneurysm of unspecified site, without rupture: Secondary | ICD-10-CM

## 2015-10-28 DIAGNOSIS — I7121 Aneurysm of the ascending aorta, without rupture: Secondary | ICD-10-CM

## 2015-10-28 MED ORDER — IOPAMIDOL (ISOVUE-370) INJECTION 76%
75.0000 mL | Freq: Once | INTRAVENOUS | Status: AC | PRN
  Administered 2015-10-28: 75 mL via INTRAVENOUS
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
## 2015-10-28 NOTE — Patient Instructions (Signed)
Continue all previous medications without any changes at this time  

## 2015-11-06 ENCOUNTER — Other Ambulatory Visit: Payer: Self-pay | Admitting: Internal Medicine

## 2015-11-06 LAB — CUP PACEART REMOTE DEVICE CHECK: Date Time Interrogation Session: 20170214113528

## 2015-11-06 NOTE — Progress Notes (Signed)
Carelink summary report received. Battery status OK. Normal device function. No new symptom episodes, tachy episodes, brady, or pause episodes. No new AF episodes. Monthly summary reports and ROV/PRN 

## 2015-11-07 LAB — CUP PACEART REMOTE DEVICE CHECK: Date Time Interrogation Session: 20170115113630

## 2015-11-07 NOTE — Progress Notes (Signed)
Carelink summary report received. Battery status OK @ this time, pt had software update. Normal device function. No new symptom episodes, tachy episodes, brady, or pause episodes. 6 AF episodes 0.2% burden- Available ECGs appear AF, RVR at times with PVCs. +warfarin. Monthly summary reports and ROV/PRN

## 2015-11-11 ENCOUNTER — Telehealth: Payer: Self-pay | Admitting: Family Medicine

## 2015-11-11 NOTE — Telephone Encounter (Signed)
Please advise on medication change if possible.

## 2015-11-11 NOTE — Telephone Encounter (Signed)
Caller name: Self  Can be reached: [phone] Pharmacy:  [id number] - [place], [place] - [address] [phone])         Reason for call: States that the medication he was given is a part of the Crestor family and he can not take it because it causes cramps. Request something not related to Crestor

## 2015-11-11 NOTE — Telephone Encounter (Signed)
Our chart shows that you were taking Simvastatin w/o difficulty for quite some time and only had issues when it was switched to a stronger medication like Lipitor or Crestor.  Please try the Simvastatin and see how you do as your labs definitely need improvement.

## 2015-11-12 NOTE — Telephone Encounter (Signed)
Called pt and left a detailed message on voicemail to inform of PCP recommendations.  

## 2015-11-14 ENCOUNTER — Ambulatory Visit (INDEPENDENT_AMBULATORY_CARE_PROVIDER_SITE_OTHER): Payer: Self-pay | Admitting: *Deleted

## 2015-11-14 DIAGNOSIS — I480 Paroxysmal atrial fibrillation: Secondary | ICD-10-CM

## 2015-11-14 NOTE — Progress Notes (Signed)
Carelink Summary Report / Loop Recorder 

## 2015-11-15 ENCOUNTER — Ambulatory Visit (INDEPENDENT_AMBULATORY_CARE_PROVIDER_SITE_OTHER): Payer: Self-pay

## 2015-11-15 DIAGNOSIS — Z7901 Long term (current) use of anticoagulants: Secondary | ICD-10-CM

## 2015-11-15 DIAGNOSIS — Z954 Presence of other heart-valve replacement: Secondary | ICD-10-CM

## 2015-11-15 DIAGNOSIS — Z952 Presence of prosthetic heart valve: Secondary | ICD-10-CM

## 2015-11-15 DIAGNOSIS — I480 Paroxysmal atrial fibrillation: Secondary | ICD-10-CM

## 2015-11-15 LAB — POCT INR: INR: 3.6

## 2015-12-10 NOTE — Telephone Encounter (Signed)
Can be reached: [phone] Pharmacy: [facility] [place]  Reason for call: pt called in stating the simvastatin caused leg cramps same as the crestor. He said that he stopped taking it 3 days ago and the leg cramps are going away. He doesn't want anything in the same family but is willing to try something else if advised.

## 2015-12-11 ENCOUNTER — Ambulatory Visit (HOSPITAL_COMMUNITY)
Admission: RE | Admit: 2015-12-11 | Discharge: 2015-12-11 | Disposition: A | Discharge status: Home / Self Care | Payer: Self-pay | Source: Ambulatory Visit | Attending: Internal Medicine | Admitting: Internal Medicine

## 2015-12-11 VITALS — BP 120/78 | HR 68 | Wt 234.8 lb

## 2015-12-11 DIAGNOSIS — R0600 Dyspnea, unspecified: Secondary | ICD-10-CM

## 2015-12-11 DIAGNOSIS — Z952 Presence of prosthetic heart valve: Secondary | ICD-10-CM | POA: Insufficient documentation

## 2015-12-11 DIAGNOSIS — I10 Essential (primary) hypertension: Secondary | ICD-10-CM | POA: Insufficient documentation

## 2015-12-11 DIAGNOSIS — Z7982 Long term (current) use of aspirin: Secondary | ICD-10-CM | POA: Insufficient documentation

## 2015-12-11 DIAGNOSIS — Z7901 Long term (current) use of anticoagulants: Secondary | ICD-10-CM | POA: Insufficient documentation

## 2015-12-11 DIAGNOSIS — I480 Paroxysmal atrial fibrillation: Secondary | ICD-10-CM | POA: Insufficient documentation

## 2015-12-11 DIAGNOSIS — Z79899 Other long term (current) drug therapy: Secondary | ICD-10-CM | POA: Insufficient documentation

## 2015-12-11 DIAGNOSIS — G4733 Obstructive sleep apnea (adult) (pediatric): Secondary | ICD-10-CM | POA: Insufficient documentation

## 2015-12-11 DIAGNOSIS — I4891 Unspecified atrial fibrillation: Secondary | ICD-10-CM

## 2015-12-11 DIAGNOSIS — I350 Nonrheumatic aortic (valve) stenosis: Secondary | ICD-10-CM | POA: Insufficient documentation

## 2015-12-11 DIAGNOSIS — I2510 Atherosclerotic heart disease of native coronary artery without angina pectoris: Secondary | ICD-10-CM | POA: Insufficient documentation

## 2015-12-11 DIAGNOSIS — Q2543 Congenital aneurysm of aorta: Secondary | ICD-10-CM | POA: Insufficient documentation

## 2015-12-11 DIAGNOSIS — E785 Hyperlipidemia, unspecified: Secondary | ICD-10-CM | POA: Insufficient documentation
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
## 2015-12-11 NOTE — Patient Instructions (Signed)
Your physician has requested that you have an echocardiogram. Echocardiography is a painless test that uses sound waves to create images of your heart. It provides your doctor with information about the size and shape of your heart and how well your heart's chambers and valves are working. This procedure takes approximately one hour. There are no restrictions for this procedure.  We will contact you in 6 months to schedule your next appointment.  

## 2015-12-12 ENCOUNTER — Telehealth: Payer: Self-pay | Admitting: Medical

## 2015-12-12 NOTE — Telephone Encounter (Signed)
This decision on lipid med needs to be made by pcp when back from vacation(she knows his hx better). But do agree to stop statin. Will you forward to Dr. Beverely Lowabori or Shanda BumpsJessica. Let pt know during the interim.

## 2015-12-12 NOTE — Telephone Encounter (Signed)
Message routed to covering provider.  Please advise.

## 2015-12-12 NOTE — Telephone Encounter (Signed)
Left message for pt to call pcp on the lipid and per Esperanza RichtersEdward [name] the patient will need to stop the statin medication at this time. I advised pt that I would send a message to Dr. Beverely Lowabori and Shanda BumpsJessica so that they were aware of the note.

## 2015-12-14 NOTE — Telephone Encounter (Signed)
Can start Zetia 10mg  daily- this is NOT a statin and should not have muscle aches associated w/ it

## 2015-12-16 ENCOUNTER — Ambulatory Visit (INDEPENDENT_AMBULATORY_CARE_PROVIDER_SITE_OTHER): Payer: Self-pay | Admitting: *Deleted

## 2015-12-16 DIAGNOSIS — I4891 Unspecified atrial fibrillation: Secondary | ICD-10-CM

## 2015-12-16 MED ORDER — EZETIMIBE 10 MG PO TABS: 10.0000 mg | ORAL_TABLET | Freq: Every day | ORAL | Status: DC

## 2015-12-16 NOTE — Telephone Encounter (Signed)
Pt notified and med filled to local pharmacy as requested.  

## 2015-12-16 NOTE — Addendum Note (Signed)
Addended by: Geannie RisenBRODMERKEL, JESSICA L on: 12/16/2015 08:52 AM   Modules accepted: Orders

## 2015-12-16 NOTE — Progress Notes (Signed)
Carelink Summary Report / Loop Recorder 

## 2015-12-27 ENCOUNTER — Ambulatory Visit (INDEPENDENT_AMBULATORY_CARE_PROVIDER_SITE_OTHER): Payer: Self-pay | Admitting: *Deleted

## 2015-12-27 ENCOUNTER — Other Ambulatory Visit: Payer: Self-pay

## 2015-12-27 ENCOUNTER — Ambulatory Visit (HOSPITAL_COMMUNITY): Payer: Self-pay | Attending: Cardiovascular Disease

## 2015-12-27 DIAGNOSIS — I350 Nonrheumatic aortic (valve) stenosis: Secondary | ICD-10-CM | POA: Insufficient documentation

## 2015-12-27 DIAGNOSIS — Z952 Presence of prosthetic heart valve: Secondary | ICD-10-CM | POA: Insufficient documentation

## 2015-12-27 DIAGNOSIS — Z6832 Body mass index (BMI) 32.0-32.9, adult: Secondary | ICD-10-CM | POA: Insufficient documentation

## 2015-12-27 DIAGNOSIS — E669 Obesity, unspecified: Secondary | ICD-10-CM | POA: Insufficient documentation

## 2015-12-27 DIAGNOSIS — Z954 Presence of other heart-valve replacement: Secondary | ICD-10-CM

## 2015-12-27 DIAGNOSIS — G4733 Obstructive sleep apnea (adult) (pediatric): Secondary | ICD-10-CM | POA: Insufficient documentation

## 2015-12-27 DIAGNOSIS — R0600 Dyspnea, unspecified: Secondary | ICD-10-CM | POA: Insufficient documentation

## 2015-12-27 DIAGNOSIS — I4891 Unspecified atrial fibrillation: Secondary | ICD-10-CM

## 2015-12-27 DIAGNOSIS — I480 Paroxysmal atrial fibrillation: Secondary | ICD-10-CM

## 2015-12-27 DIAGNOSIS — E785 Hyperlipidemia, unspecified: Secondary | ICD-10-CM | POA: Insufficient documentation

## 2015-12-27 DIAGNOSIS — Z7901 Long term (current) use of anticoagulants: Secondary | ICD-10-CM

## 2015-12-27 LAB — POCT INR: INR: 4.3

## 2016-01-01 ENCOUNTER — Telehealth: Payer: Self-pay | Admitting: General Practice

## 2016-01-01 DIAGNOSIS — E785 Hyperlipidemia, unspecified: Secondary | ICD-10-CM

## 2016-01-01 NOTE — Telephone Encounter (Signed)
-----   Message from Sheliah HatchKatherine E [name], MD sent at 12/31/2015  7:10 PM EDT ----- Please restart him on Simvastatin 20mg  nightly and repeat LFTs in 6-8 weeks  Thanks! ----- Message -----    From: Noralee SpaceHeather M Schub, RN    Sent: 12/27/2015   1:16 PM      To: Sheliah HatchKatherine E [name], MD  Hey Dr Beverely Lowabori, I'm Dr Bensimhon's nurse.  I was talking to Mark [name] regarding his echo results and he was asking me about his cholesterol medication.  I guess you started him on Crestor but he could not tolerate, so he said you gave him another med but he can not afford it.  He was asking me if he can go back on his Simvastatin, I told him since you were managing and checking his lipids he should f/u with you.  He said he had talked to your nurse a few times but nothing had been done yet.  Can you please just f/u with him, thanks

## 2016-01-01 NOTE — Telephone Encounter (Signed)
Called pt and LMOVM to return call.  °

## 2016-01-02 MED ORDER — SIMVASTATIN 20 MG PO TABS: 20.0000 mg | ORAL_TABLET | Freq: Every day | ORAL | Status: DC

## 2016-01-02 NOTE — Telephone Encounter (Signed)
Med filled to local pharmacy as requested, pt scheduled for lab appt and labs ordered.

## 2016-01-02 NOTE — Addendum Note (Signed)
Addended by: Yvone NeuBRODMERKEL, JESSICA L on: 01/02/2016 11:03 AM   Modules accepted: Orders

## 2016-01-13 ENCOUNTER — Ambulatory Visit (INDEPENDENT_AMBULATORY_CARE_PROVIDER_SITE_OTHER): Payer: Self-pay | Admitting: *Deleted

## 2016-01-13 DIAGNOSIS — I4891 Unspecified atrial fibrillation: Secondary | ICD-10-CM

## 2016-01-13 NOTE — Progress Notes (Signed)
Carelink Summary Report / Loop Recorder 

## 2016-01-24 LAB — CUP PACEART REMOTE DEVICE CHECK: Date Time Interrogation Session: 20170316113614

## 2016-01-26 LAB — CUP PACEART REMOTE DEVICE CHECK: Date Time Interrogation Session: 20170415113603

## 2016-01-26 NOTE — Progress Notes (Signed)
Carelink summary report received. Battery status OK. Normal device function. No new symptom episodes, tachy episodes, brady, or pause episodes. 1 AF episode, 1.5% burden, +Warfarin. Monthly summary reports and ROV/PRN

## 2016-01-29 ENCOUNTER — Emergency Department (HOSPITAL_COMMUNITY)
Admission: EM | Admit: 2016-01-29 | Discharge: 2016-01-29 | Disposition: A | Discharge status: Home / Self Care | Payer: Self-pay | Attending: Emergency Medicine | Admitting: Emergency Medicine

## 2016-01-29 ENCOUNTER — Encounter (HOSPITAL_COMMUNITY): Payer: Self-pay | Admitting: Emergency Medicine

## 2016-01-29 ENCOUNTER — Emergency Department (HOSPITAL_COMMUNITY): Payer: Self-pay

## 2016-01-29 DIAGNOSIS — I712 Thoracic aortic aneurysm, without rupture, unspecified: Secondary | ICD-10-CM

## 2016-01-29 DIAGNOSIS — Z79899 Other long term (current) drug therapy: Secondary | ICD-10-CM | POA: Insufficient documentation

## 2016-01-29 DIAGNOSIS — I2510 Atherosclerotic heart disease of native coronary artery without angina pectoris: Secondary | ICD-10-CM | POA: Insufficient documentation

## 2016-01-29 DIAGNOSIS — I480 Paroxysmal atrial fibrillation: Secondary | ICD-10-CM | POA: Insufficient documentation

## 2016-01-29 DIAGNOSIS — Z7982 Long term (current) use of aspirin: Secondary | ICD-10-CM | POA: Insufficient documentation

## 2016-01-29 DIAGNOSIS — Z7901 Long term (current) use of anticoagulants: Secondary | ICD-10-CM | POA: Insufficient documentation

## 2016-01-29 DIAGNOSIS — I10 Essential (primary) hypertension: Secondary | ICD-10-CM | POA: Insufficient documentation

## 2016-01-29 DIAGNOSIS — R079 Chest pain, unspecified: Secondary | ICD-10-CM | POA: Insufficient documentation

## 2016-01-29 LAB — COMPREHENSIVE METABOLIC PANEL
ALT: 37 U/L (ref 17–63)
AST: 28 U/L (ref 15–41)
Albumin: 4.1 g/dL (ref 3.5–5.0)
Alkaline Phosphatase: 61 U/L (ref 38–126)
Anion gap: 7 (ref 5–15)
BUN: 18 mg/dL (ref 6–20)
CO2: 26 mmol/L (ref 22–32)
Calcium: 9.4 mg/dL (ref 8.9–10.3)
Chloride: 105 mmol/L (ref 101–111)
Creatinine, Ser: 1.16 mg/dL (ref 0.61–1.24)
GFR calc Af Amer: >60 mL/min (ref >60)
GFR calc non Af Amer: >60 mL/min (ref >60)
Glucose, Bld: 117 mg/dL — ABNORMAL HIGH (ref 65–99)
Potassium: 3.8 mmol/L (ref 3.5–5.1)
Sodium: 138 mmol/L (ref 135–145)
Total Bilirubin: 0.7 mg/dL (ref 0.3–1.2)
Total Protein: 7 g/dL (ref 6.5–8.1)

## 2016-01-29 LAB — CBC WITH DIFFERENTIAL/PLATELET
Basophils Absolute: 0 10*3/uL (ref 0.0–0.1)
Basophils Relative: 0 %
Eosinophils Absolute: 0.1 10*3/uL (ref 0.0–0.7)
Eosinophils Relative: 1 %
HCT: 40.2 % (ref 39.0–52.0)
Hemoglobin: 13.6 g/dL (ref 13.0–17.0)
Lymphocytes Relative: 35 %
Lymphs Abs: 2.9 10*3/uL (ref 0.7–4.0)
MCH: 30.1 pg (ref 26.0–34.0)
MCHC: 33.8 g/dL (ref 30.0–36.0)
MCV: 88.9 fL (ref 78.0–100.0)
Monocytes Absolute: 0.7 10*3/uL (ref 0.1–1.0)
Monocytes Relative: 9 %
Neutro Abs: 4.6 10*3/uL (ref 1.7–7.7)
Neutrophils Relative %: 55 %
Platelets: 205 10*3/uL (ref 150–400)
RBC: 4.52 MIL/uL (ref 4.22–5.81)
RDW: 12.7 % (ref 11.5–15.5)
WBC: 8.3 10*3/uL (ref 4.0–10.5)

## 2016-01-29 LAB — PROTIME-INR
INR: 3.86 — ABNORMAL HIGH (ref 0.00–1.49)
Prothrombin Time: 37 seconds — ABNORMAL HIGH (ref 11.6–15.2)

## 2016-01-29 LAB — I-STAT TROPONIN, ED: Troponin i, poc: 0.01 ng/mL (ref 0.00–0.08)

## 2016-01-29 MED ORDER — IOPAMIDOL (ISOVUE-370) INJECTION 76%
100.0000 mL | Freq: Once | INTRAVENOUS | Status: AC | PRN
  Administered 2016-01-29: 100 mL via INTRAVENOUS

## 2016-01-29 MED ORDER — IOPAMIDOL (ISOVUE-370) INJECTION 76%
INTRAVENOUS | Status: AC
  Filled 2016-01-29: qty 100

## 2016-01-29 NOTE — ED Notes (Signed)
Pt staes he understands instructions. 
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
## 2016-01-29 NOTE — ED Notes (Signed)
Pt c/o lower mid chest pain, onset yesterday but would come and go,  St's tonight continues to come and go but pain is more intense,.  Describes pain as a tearing type pain.  Also st's he has had nausea and diaphoresis,,

## 2016-01-29 NOTE — ED Provider Notes (Signed)
Goal INR is between 3.5 and 4.5  Azalia BilisKevin Zimri Brennen, MD 01/29/16 [phone]

## 2016-01-29 NOTE — ED Notes (Signed)
Patient transported to CT 

## 2016-01-29 NOTE — ED Notes (Signed)
MD at bedside. 

## 2016-02-08 ENCOUNTER — Other Ambulatory Visit (HOSPITAL_COMMUNITY): Payer: Self-pay | Admitting: Internal Medicine

## 2016-02-12 ENCOUNTER — Ambulatory Visit (INDEPENDENT_AMBULATORY_CARE_PROVIDER_SITE_OTHER): Payer: Self-pay | Admitting: *Deleted

## 2016-02-12 DIAGNOSIS — I4891 Unspecified atrial fibrillation: Secondary | ICD-10-CM

## 2016-02-12 NOTE — Progress Notes (Signed)
Carelink Summary Report / Loop Recorder 

## 2016-02-13 ENCOUNTER — Other Ambulatory Visit (INDEPENDENT_AMBULATORY_CARE_PROVIDER_SITE_OTHER): Payer: Self-pay

## 2016-02-13 DIAGNOSIS — E785 Hyperlipidemia, unspecified: Secondary | ICD-10-CM

## 2016-02-13 LAB — HEPATIC FUNCTION PANEL
ALT: 19 U/L (ref 0–53)
AST: 18 U/L (ref 0–37)
Albumin: 4.5 g/dL (ref 3.5–5.2)
Alkaline Phosphatase: 65 U/L (ref 39–117)
Bilirubin, Direct: 0.2 mg/dL (ref 0.0–0.3)
Total Bilirubin: 1.6 mg/dL — ABNORMAL HIGH (ref 0.2–1.2)
Total Protein: 6.9 g/dL (ref 6.0–8.3)

## 2016-02-18 LAB — CUP PACEART REMOTE DEVICE CHECK: Date Time Interrogation Session: 20170515120557

## 2016-02-28 ENCOUNTER — Telehealth: Payer: Self-pay | Admitting: Cardiology

## 2016-02-28 NOTE — Telephone Encounter (Signed)
Spoke w/ pt and requested that he send a manual transmission b/c his home monitor has not updated in at least 14 days.   

## 2016-03-13 ENCOUNTER — Ambulatory Visit (INDEPENDENT_AMBULATORY_CARE_PROVIDER_SITE_OTHER): Payer: Self-pay

## 2016-03-13 ENCOUNTER — Ambulatory Visit (INDEPENDENT_AMBULATORY_CARE_PROVIDER_SITE_OTHER): Payer: Self-pay | Admitting: *Deleted

## 2016-03-13 DIAGNOSIS — Z952 Presence of prosthetic heart valve: Secondary | ICD-10-CM

## 2016-03-13 DIAGNOSIS — I4891 Unspecified atrial fibrillation: Secondary | ICD-10-CM

## 2016-03-13 DIAGNOSIS — Z7901 Long term (current) use of anticoagulants: Secondary | ICD-10-CM

## 2016-03-13 DIAGNOSIS — I480 Paroxysmal atrial fibrillation: Secondary | ICD-10-CM

## 2016-03-13 DIAGNOSIS — Z954 Presence of other heart-valve replacement: Secondary | ICD-10-CM

## 2016-03-13 LAB — CUP PACEART REMOTE DEVICE CHECK: Date Time Interrogation Session: 20170614123621

## 2016-03-13 LAB — POCT INR: INR: 5.2

## 2016-03-13 NOTE — Progress Notes (Signed)
Carelink Summary Report / Loop Recorder 

## 2016-03-19 ENCOUNTER — Telehealth: Payer: Self-pay | Admitting: Cardiology

## 2016-03-19 NOTE — Telephone Encounter (Signed)
Spoke w/ pt and requested that he send a manual transmission b/c his home monitor has not updated in at least 14 days.   

## 2016-03-20 LAB — CUP PACEART REMOTE DEVICE CHECK: Date Time Interrogation Session: 20170714133544

## 2016-03-27 ENCOUNTER — Ambulatory Visit (INDEPENDENT_AMBULATORY_CARE_PROVIDER_SITE_OTHER): Payer: Self-pay | Admitting: *Deleted

## 2016-03-27 ENCOUNTER — Encounter: Payer: Self-pay | Admitting: Family Medicine

## 2016-03-27 DIAGNOSIS — Z952 Presence of prosthetic heart valve: Secondary | ICD-10-CM

## 2016-03-27 DIAGNOSIS — I480 Paroxysmal atrial fibrillation: Secondary | ICD-10-CM

## 2016-03-27 DIAGNOSIS — Z954 Presence of other heart-valve replacement: Secondary | ICD-10-CM

## 2016-03-27 DIAGNOSIS — Z7901 Long term (current) use of anticoagulants: Secondary | ICD-10-CM

## 2016-03-27 DIAGNOSIS — I4891 Unspecified atrial fibrillation: Secondary | ICD-10-CM

## 2016-03-27 LAB — POCT INR: INR: 4.4

## 2016-04-03 ENCOUNTER — Encounter: Payer: Self-pay | Admitting: Physician Assistant

## 2016-04-09 ENCOUNTER — Telehealth: Payer: Self-pay | Admitting: Cardiology

## 2016-04-09 NOTE — Telephone Encounter (Signed)
LMOVM requesting that pt send manual transmission b/c home monitor has not updated in at least 14 days.    

## 2016-04-13 ENCOUNTER — Ambulatory Visit (INDEPENDENT_AMBULATORY_CARE_PROVIDER_SITE_OTHER): Payer: Self-pay | Admitting: *Deleted

## 2016-04-13 DIAGNOSIS — I480 Paroxysmal atrial fibrillation: Secondary | ICD-10-CM

## 2016-04-13 NOTE — Progress Notes (Signed)
Carelink Summary Report / Loop Recorder 

## 2016-04-23 ENCOUNTER — Encounter: Payer: Self-pay | Admitting: Physician Assistant

## 2016-04-27 ENCOUNTER — Encounter: Payer: Self-pay | Admitting: Acute Care

## 2016-04-27 ENCOUNTER — Other Ambulatory Visit: Payer: Self-pay | Admitting: Internal Medicine

## 2016-04-27 ENCOUNTER — Ambulatory Visit (INDEPENDENT_AMBULATORY_CARE_PROVIDER_SITE_OTHER): Payer: Self-pay | Admitting: Acute Care

## 2016-04-27 ENCOUNTER — Ambulatory Visit (INDEPENDENT_AMBULATORY_CARE_PROVIDER_SITE_OTHER): Payer: Self-pay | Admitting: *Deleted

## 2016-04-27 VITALS — BP 134/80 | HR 57 | Ht 71.0 in | Wt 247.2 lb

## 2016-04-27 DIAGNOSIS — G4733 Obstructive sleep apnea (adult) (pediatric): Secondary | ICD-10-CM

## 2016-04-27 DIAGNOSIS — Z954 Presence of other heart-valve replacement: Secondary | ICD-10-CM

## 2016-04-27 DIAGNOSIS — I480 Paroxysmal atrial fibrillation: Secondary | ICD-10-CM

## 2016-04-27 DIAGNOSIS — Z7901 Long term (current) use of anticoagulants: Secondary | ICD-10-CM

## 2016-04-27 DIAGNOSIS — Z952 Presence of prosthetic heart valve: Secondary | ICD-10-CM

## 2016-04-27 DIAGNOSIS — I4891 Unspecified atrial fibrillation: Secondary | ICD-10-CM

## 2016-04-27 LAB — POCT INR: INR: 4.6

## 2016-04-27 NOTE — Assessment & Plan Note (Addendum)
Pt. With hypersomnolence during the day, morning headaches and witnessed apnea by spouse. Plan: We will schedule a home sleep study to evaluate you for sleep apnea. We will call you with the results . If the results indicate you need CPAP treatment we will order the device and equipment Goal is to wear your CPAP  for at least 4-6 hours each night for maximal clinical benefit. Continue to work on weight loss, as the link between excess weight  and sleep apnea is well established.  Do not drive if sleepy. Follow up appointment 4-6 weeks after initiation of therapy. Do not drive if sleepy. Continue working on weight loss. Please contact office for sooner follow up if symptoms do not improve or worsen or seek emergency care .

## 2016-04-27 NOTE — Patient Instructions (Addendum)
It is nice to meet you today. We will schedule a home sleep study to evaluate you for sleep apnea. We will call you with the results . If the results indicate you need CPAP treatment we will order the device and equipment Follow up appointment 4-6 weeks after initiation of therapy. Do not drive if sleepy. Please contact office for sooner follow up if symptoms do not improve or worsen or seek emergency care .
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
## 2016-04-30 NOTE — Progress Notes (Signed)
Reviewed & agree with plan  
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
## 2016-05-05 ENCOUNTER — Encounter: Payer: Self-pay | Admitting: Physician Assistant

## 2016-05-05 DIAGNOSIS — G4733 Obstructive sleep apnea (adult) (pediatric): Secondary | ICD-10-CM

## 2016-05-06 ENCOUNTER — Encounter: Payer: Self-pay | Admitting: Physician Assistant

## 2016-05-09 LAB — CUP PACEART REMOTE DEVICE CHECK: Date Time Interrogation Session: 20170813160127

## 2016-05-11 ENCOUNTER — Encounter: Payer: Self-pay | Admitting: Family Medicine

## 2016-05-11 ENCOUNTER — Ambulatory Visit (INDEPENDENT_AMBULATORY_CARE_PROVIDER_SITE_OTHER): Payer: Self-pay | Admitting: Family Medicine

## 2016-05-11 VITALS — BP 122/84 | HR 90 | Temp 98.7°F | Resp 16 | Ht 71.0 in | Wt 244.4 lb

## 2016-05-11 DIAGNOSIS — L03119 Cellulitis of unspecified part of limb: Secondary | ICD-10-CM

## 2016-05-11 MED ORDER — CEPHALEXIN 500 MG PO CAPS: 500.0000 mg | ORAL_CAPSULE | Freq: Three times a day (TID) | ORAL | 0 refills | Status: AC

## 2016-05-11 NOTE — Patient Instructions (Signed)
Follow up as needed Please keep note of the redness and if it continues to spread, let me know!! Start the Keflex 3x/day Call with any questions or concerns Hang in there!!!

## 2016-05-11 NOTE — Progress Notes (Signed)
   Subjective:    Patient ID: Mark [name], male    DOB: 08-03-1961, 55 y.o.   MRN: [id number]  HPI R elbow cellulitis- pt reports elbow started itching 2 weeks ago and there was a bite.  Wife noticed redness and swelling over the weekend and pt now has pain at site of bite and whole elbow is now sore.  No fevers.  Pt reports the area was draining.   Review of Systems For ROS see HPI     Objective:   Physical Exam  Constitutional: He is oriented to person, place, and time. He appears well-developed and well-nourished. No distress.  HENT:  Head: Normocephalic and atraumatic.  Cardiovascular: Intact distal pulses.   Neurological: He is alert and oriented to person, place, and time.  Skin: Skin is warm and dry. There is erythema (redness and warmth over R elbow extending from central bite site.  no fluctuance or drainage- no area to I&D.  + warmth, mild TTP).  Psychiatric: He has a normal mood and affect. His behavior is normal.  Vitals reviewed.         Assessment & Plan:  Cellulitis- new.  Pt w/ obvious cellulitis surrounding a bite site on R elbow.  No fluctuance or drainage today, no area to I&D.  Start Keflex 3x/day x7 days.  Pt to monitor redness and if it is spreading, he is to call for change in abx.  Reviewed supportive care and red flags that should prompt return.  Pt expressed understanding and is in agreement w/ plan.

## 2016-05-11 NOTE — Progress Notes (Signed)
Pre visit review using our clinic review tool, if applicable. No additional management support is needed unless otherwise documented below in the visit note. 

## 2016-05-12 ENCOUNTER — Ambulatory Visit (INDEPENDENT_AMBULATORY_CARE_PROVIDER_SITE_OTHER): Payer: Self-pay | Admitting: *Deleted

## 2016-05-12 DIAGNOSIS — I480 Paroxysmal atrial fibrillation: Secondary | ICD-10-CM

## 2016-05-14 ENCOUNTER — Emergency Department (HOSPITAL_COMMUNITY)
Admission: EM | Admit: 2016-05-14 | Discharge: 2016-05-15 | Disposition: A | Discharge status: Home / Self Care | Payer: Self-pay | Attending: Emergency Medicine | Admitting: Emergency Medicine

## 2016-05-14 ENCOUNTER — Telehealth: Payer: Self-pay | Admitting: Pulmonary Disease

## 2016-05-14 ENCOUNTER — Encounter (HOSPITAL_COMMUNITY): Payer: Self-pay | Admitting: *Deleted

## 2016-05-14 DIAGNOSIS — I2510 Atherosclerotic heart disease of native coronary artery without angina pectoris: Secondary | ICD-10-CM | POA: Insufficient documentation

## 2016-05-14 DIAGNOSIS — I10 Essential (primary) hypertension: Secondary | ICD-10-CM | POA: Insufficient documentation

## 2016-05-14 DIAGNOSIS — R519 Headache, unspecified: Secondary | ICD-10-CM

## 2016-05-14 DIAGNOSIS — Z7901 Long term (current) use of anticoagulants: Secondary | ICD-10-CM | POA: Insufficient documentation

## 2016-05-14 DIAGNOSIS — G4733 Obstructive sleep apnea (adult) (pediatric): Secondary | ICD-10-CM

## 2016-05-14 DIAGNOSIS — H1131 Conjunctival hemorrhage, right eye: Secondary | ICD-10-CM | POA: Insufficient documentation

## 2016-05-14 DIAGNOSIS — R51 Headache: Secondary | ICD-10-CM | POA: Insufficient documentation

## 2016-05-14 DIAGNOSIS — Z7982 Long term (current) use of aspirin: Secondary | ICD-10-CM | POA: Insufficient documentation

## 2016-05-14 LAB — CBC
HCT: 41.5 % (ref 39.0–52.0)
Hemoglobin: 13.8 g/dL (ref 13.0–17.0)
MCH: 30.4 pg (ref 26.0–34.0)
MCHC: 33.3 g/dL (ref 30.0–36.0)
MCV: 91.4 fL (ref 78.0–100.0)
Platelets: 195 K/uL (ref 150–400)
RBC: 4.54 MIL/uL (ref 4.22–5.81)
RDW: 12.8 % (ref 11.5–15.5)
WBC: 7.3 K/uL (ref 4.0–10.5)

## 2016-05-14 LAB — PROTIME-INR
INR: 2.66
Prothrombin Time: 28.8 s — ABNORMAL HIGH (ref 11.4–15.2)

## 2016-05-14 MED ORDER — TETRACAINE HCL 0.5 % OP SOLN
1.0000 [drp] | Freq: Once | OPHTHALMIC | Status: AC
  Administered 2016-05-14: 1 [drp] via OPHTHALMIC
  Filled 2016-05-14: qty 2

## 2016-05-14 NOTE — ED Triage Notes (Signed)
Pt has redness (?popped blood vessel) in right eye.  This occurred this pm, not associated with straining or trauma.  Pt notes that he is on coumadin

## 2016-05-14 NOTE — Telephone Encounter (Signed)
HST - 05/05/16 AHI: 16/h Lowest Desat: 79% Per Dr. Vassie LollAlva:  Patient has moderate sleep apnea and needs to start CPAP therapy.. Auto 5-15cm - download in 4 weeks  ------------------ Order entered.  Patient aware.  Patient scheduled for 6 week follow up with Dr. Vassie LollAlva.  Nothing further needed.
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
## 2016-05-14 NOTE — ED Notes (Signed)
Pt moved to acute side due to concerns of bleeding on the brain

## 2016-05-15 ENCOUNTER — Other Ambulatory Visit: Payer: Self-pay | Admitting: *Deleted

## 2016-05-15 ENCOUNTER — Emergency Department (HOSPITAL_COMMUNITY): Payer: Self-pay

## 2016-05-15 DIAGNOSIS — G4733 Obstructive sleep apnea (adult) (pediatric): Secondary | ICD-10-CM

## 2016-05-15 NOTE — ED Notes (Signed)
Patient transported to CT 
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
## 2016-05-15 NOTE — Discharge Instructions (Signed)
Please read and follow all provided instructions.  Your diagnoses today include:  1. Subconjunctival hemorrhage, right   2. Acute nonintractable headache, unspecified headache type     Tests performed today include:  CT of your head which was normal and did not show any serious cause of your headache  Vital signs. See below for your results today.   Medications:   None  Take any prescribed medications only as directed.  Additional information:  Follow any educational materials contained in this packet.  You are having a headache. No specific cause was found today for your headache. It may have been a migraine or other cause of headache. Stress, anxiety, fatigue, and depression are common triggers for headaches.   Your headache today does not appear to be life-threatening or require hospitalization, but often the exact cause of headaches is not determined in the emergency department. Therefore, follow-up with your doctor is very important to find out what may have caused your headache and whether or not you need any further diagnostic testing or treatment.   Sometimes headaches can appear benign (not harmful), but then more serious symptoms can develop which should prompt an immediate re-evaluation by your doctor or the emergency department.  BE VERY CAREFUL not to take multiple medicines containing Tylenol (also called acetaminophen). Doing so can lead to an overdose which can damage your liver and cause liver failure and possibly death.   Follow-up instructions: Please follow-up with your primary care provider in the next 3 days for further evaluation of your symptoms.   Return instructions:   Please return to the Emergency Department if you experience worsening symptoms.  Return if the medications do not resolve your headache, if it recurs, or if you have multiple episodes of vomiting or cannot keep down fluids.  Return if you have a change from the usual headache.  RETURN  IMMEDIATELY IF you:  Develop a sudden, severe headache  Develop confusion or become poorly responsive or faint  Develop a fever above 100.70F or problem breathing  Have a change in speech, vision, swallowing, or understanding  Develop new weakness, numbness, tingling, incoordination in your arms or legs  Have a seizure  Please return if you have any other emergent concerns.  Additional Information:  Your vital signs today were: BP 117/90    Pulse (!) 58    Temp 97.7 F (36.5 C) (Oral)    Resp 12    Wt 110.7 kg    SpO2 95%    BMI 34.03 kg/m  If your blood pressure (BP) was elevated above 135/85 this visit, please have this repeated by your doctor within one month. --------------

## 2016-06-05 ENCOUNTER — Other Ambulatory Visit (HOSPITAL_COMMUNITY): Payer: Self-pay | Admitting: Internal Medicine

## 2016-06-06 LAB — CUP PACEART REMOTE DEVICE CHECK: Date Time Interrogation Session: 20170912140747

## 2016-06-06 NOTE — Progress Notes (Signed)
Carelink summary report received. Battery status OK. Normal device function. No new symptom episodes, tachy episodes, brady, or pause episodes. 0.5% AF, +Warfarin.  Monthly summary reports and ROV/PRN

## 2016-06-11 ENCOUNTER — Ambulatory Visit (INDEPENDENT_AMBULATORY_CARE_PROVIDER_SITE_OTHER): Payer: Self-pay | Admitting: Family Medicine

## 2016-06-11 ENCOUNTER — Encounter: Payer: Self-pay | Admitting: Family Medicine

## 2016-06-11 ENCOUNTER — Ambulatory Visit (INDEPENDENT_AMBULATORY_CARE_PROVIDER_SITE_OTHER): Payer: Self-pay | Admitting: *Deleted

## 2016-06-11 ENCOUNTER — Ambulatory Visit (HOSPITAL_BASED_OUTPATIENT_CLINIC_OR_DEPARTMENT_OTHER)
Admission: RE | Admit: 2016-06-11 | Discharge: 2016-06-11 | Disposition: A | Discharge status: Home / Self Care | Payer: Self-pay | Source: Ambulatory Visit | Attending: Family Medicine | Admitting: Family Medicine

## 2016-06-11 VITALS — BP 138/86 | HR 76 | Resp 76 | Ht 71.0 in | Wt 242.5 lb

## 2016-06-11 DIAGNOSIS — S472XXA Crushing injury of left shoulder and upper arm, initial encounter: Secondary | ICD-10-CM

## 2016-06-11 DIAGNOSIS — X58XXXA Exposure to other specified factors, initial encounter: Secondary | ICD-10-CM | POA: Insufficient documentation

## 2016-06-11 DIAGNOSIS — I480 Paroxysmal atrial fibrillation: Secondary | ICD-10-CM
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
## 2016-06-11 NOTE — Progress Notes (Signed)
Carelink Summary Report / Loop Recorder 

## 2016-06-11 NOTE — Progress Notes (Signed)
Pre visit review using our clinic review tool, if applicable. No additional management support is needed unless otherwise documented below in the visit note. 

## 2016-06-11 NOTE — Patient Instructions (Signed)
Go get your xray at [facility]- once we have the results, we'll determine the next step Continue to ice your arm and limit your motion Tylenol as needed for pain (this is safe w/ coumadin) Call with any questions or concerns Hang in there!!!

## 2016-06-23 ENCOUNTER — Ambulatory Visit (INDEPENDENT_AMBULATORY_CARE_PROVIDER_SITE_OTHER): Payer: Self-pay | Admitting: *Deleted

## 2016-06-23 DIAGNOSIS — Z952 Presence of prosthetic heart valve: Secondary | ICD-10-CM

## 2016-06-23 DIAGNOSIS — Z7901 Long term (current) use of anticoagulants: Secondary | ICD-10-CM

## 2016-06-23 DIAGNOSIS — I480 Paroxysmal atrial fibrillation: Secondary | ICD-10-CM

## 2016-06-23 LAB — POCT INR: INR: 5.1

## 2016-06-25 ENCOUNTER — Telehealth: Payer: Self-pay | Admitting: Pulmonary Disease

## 2016-06-26 ENCOUNTER — Other Ambulatory Visit: Payer: Self-pay | Admitting: Family Medicine

## 2016-06-26 ENCOUNTER — Ambulatory Visit: Payer: Self-pay | Admitting: Pulmonary Disease

## 2016-06-28 ENCOUNTER — Other Ambulatory Visit: Payer: Self-pay | Admitting: Family Medicine

## 2016-07-12 LAB — CUP PACEART REMOTE DEVICE CHECK
Date Time Interrogation Session: 20171012143907
Implantable Pulse Generator Implant Date: 20140912

## 2016-07-12 NOTE — Progress Notes (Signed)
Carelink summary report received. Battery status OK. Normal device function. No new symptom episodes, tachy episodes, brady, or pause episodes. 1 AF episode (1.2% of time), +Warfarin.  Monthly summary reports and ROV/PRN

## 2016-07-13 ENCOUNTER — Ambulatory Visit (INDEPENDENT_AMBULATORY_CARE_PROVIDER_SITE_OTHER): Payer: Self-pay | Admitting: *Deleted

## 2016-07-13 DIAGNOSIS — I4891 Unspecified atrial fibrillation: Secondary | ICD-10-CM

## 2016-07-13 NOTE — Progress Notes (Signed)
Carelink Summary Report / Loop Recorder 

## 2016-07-31 ENCOUNTER — Ambulatory Visit (INDEPENDENT_AMBULATORY_CARE_PROVIDER_SITE_OTHER): Payer: Self-pay | Admitting: Pharmacist

## 2016-07-31 DIAGNOSIS — Z952 Presence of prosthetic heart valve: Secondary | ICD-10-CM

## 2016-07-31 DIAGNOSIS — I480 Paroxysmal atrial fibrillation: Secondary | ICD-10-CM

## 2016-07-31 DIAGNOSIS — Z7901 Long term (current) use of anticoagulants: Secondary | ICD-10-CM

## 2016-07-31 LAB — POCT INR: INR: 4.4

## 2016-08-08 ENCOUNTER — Other Ambulatory Visit: Payer: Self-pay | Admitting: Internal Medicine

## 2016-08-10 ENCOUNTER — Ambulatory Visit (INDEPENDENT_AMBULATORY_CARE_PROVIDER_SITE_OTHER): Payer: Self-pay | Admitting: *Deleted

## 2016-08-10 DIAGNOSIS — I480 Paroxysmal atrial fibrillation: Secondary | ICD-10-CM

## 2016-08-11 NOTE — Progress Notes (Signed)
Carelink Summary Report / Loop Recorder 

## 2016-08-26 LAB — CUP PACEART REMOTE DEVICE CHECK
Date Time Interrogation Session: 20171111153846
Implantable Pulse Generator Implant Date: 20140912

## 2016-08-28 ENCOUNTER — Ambulatory Visit (INDEPENDENT_AMBULATORY_CARE_PROVIDER_SITE_OTHER): Payer: Self-pay

## 2016-08-28 DIAGNOSIS — Z7901 Long term (current) use of anticoagulants: Secondary | ICD-10-CM

## 2016-08-28 DIAGNOSIS — I480 Paroxysmal atrial fibrillation: Secondary | ICD-10-CM

## 2016-08-28 DIAGNOSIS — Z952 Presence of prosthetic heart valve: Secondary | ICD-10-CM

## 2016-08-28 DIAGNOSIS — I4891 Unspecified atrial fibrillation: Secondary | ICD-10-CM

## 2016-08-28 LAB — POCT INR: INR: 4.3

## 2016-09-04 ENCOUNTER — Telehealth: Payer: Self-pay | Admitting: *Deleted

## 2016-09-04 LAB — CUP PACEART REMOTE DEVICE CHECK
Date Time Interrogation Session: 20171211230540
Implantable Pulse Generator Implant Date: 20140912

## 2016-09-04 NOTE — Telephone Encounter (Signed)
LMOVM requesting call back.  Gave [facility] phone number for return call.  Patient's LINQ is at RRT as of 07/13/16.  Will schedule appointment with Dr. [name] per 08/2016 recall.

## 2016-09-04 NOTE — Telephone Encounter (Signed)
Patient returned call.  Made patient aware that LINQ has reached RRT.  He is agreeable to appointment with Dr. Rayann Heman on 09/25/16 at 10:15am.  Coumadin clinic appointment time (same day) moved to coordinate with this appointment per patient's preference.  Patient is appreciative of call and denies additional questions or concerns at this time.  Return kit ordered, discontinued in Carelink.

## 2016-09-09 ENCOUNTER — Other Ambulatory Visit (HOSPITAL_COMMUNITY): Payer: Self-pay | Admitting: Internal Medicine

## 2016-09-18 ENCOUNTER — Other Ambulatory Visit: Payer: Self-pay | Admitting: *Deleted

## 2016-09-18 DIAGNOSIS — I712 Thoracic aortic aneurysm, without rupture: Secondary | ICD-10-CM

## 2016-09-18 DIAGNOSIS — I7121 Aneurysm of the ascending aorta, without rupture: Secondary | ICD-10-CM

## 2016-09-24 ENCOUNTER — Other Ambulatory Visit: Payer: Self-pay | Admitting: Family Medicine

## 2016-09-25 ENCOUNTER — Encounter: Payer: Self-pay | Admitting: Internal Medicine

## 2016-09-25 ENCOUNTER — Ambulatory Visit (INDEPENDENT_AMBULATORY_CARE_PROVIDER_SITE_OTHER): Payer: Self-pay

## 2016-09-25 ENCOUNTER — Ambulatory Visit (INDEPENDENT_AMBULATORY_CARE_PROVIDER_SITE_OTHER): Payer: Self-pay | Admitting: Internal Medicine

## 2016-09-25 VITALS — BP 108/70 | HR 57 | Ht 71.0 in | Wt 240.6 lb

## 2016-09-25 DIAGNOSIS — Z952 Presence of prosthetic heart valve: Secondary | ICD-10-CM

## 2016-09-25 DIAGNOSIS — G4733 Obstructive sleep apnea (adult) (pediatric): Secondary | ICD-10-CM

## 2016-09-25 DIAGNOSIS — Z7901 Long term (current) use of anticoagulants: Secondary | ICD-10-CM

## 2016-09-25 DIAGNOSIS — I480 Paroxysmal atrial fibrillation: Secondary | ICD-10-CM

## 2016-09-25 DIAGNOSIS — I4891 Unspecified atrial fibrillation: Secondary | ICD-10-CM

## 2016-09-25 LAB — POCT INR: INR: 3.9

## 2016-09-25 NOTE — Patient Instructions (Signed)
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
## 2016-09-28 ENCOUNTER — Other Ambulatory Visit: Payer: Self-pay | Admitting: Family Medicine

## 2016-09-29 ENCOUNTER — Encounter (HOSPITAL_COMMUNITY): Payer: Self-pay | Admitting: Emergency Medicine

## 2016-09-29 ENCOUNTER — Emergency Department (HOSPITAL_COMMUNITY)
Admission: EM | Admit: 2016-09-29 | Discharge: 2016-09-29 | Disposition: A | Discharge status: Home / Self Care | Payer: Self-pay | Attending: Emergency Medicine | Admitting: Emergency Medicine

## 2016-09-29 DIAGNOSIS — Z7901 Long term (current) use of anticoagulants: Secondary | ICD-10-CM | POA: Insufficient documentation

## 2016-09-29 DIAGNOSIS — Z7982 Long term (current) use of aspirin: Secondary | ICD-10-CM | POA: Insufficient documentation

## 2016-09-29 DIAGNOSIS — I10 Essential (primary) hypertension: Secondary | ICD-10-CM | POA: Insufficient documentation

## 2016-09-29 DIAGNOSIS — R42 Dizziness and giddiness: Secondary | ICD-10-CM | POA: Insufficient documentation

## 2016-09-29 LAB — CUP PACEART INCLINIC DEVICE CHECK
Date Time Interrogation Session: 20180126161058
Implantable Pulse Generator Implant Date: 20140912

## 2016-09-29 LAB — COMPREHENSIVE METABOLIC PANEL
ALT: 25 U/L (ref 17–63)
AST: 24 U/L (ref 15–41)
Albumin: 4.5 g/dL (ref 3.5–5.0)
Alkaline Phosphatase: 57 U/L (ref 38–126)
Anion gap: 10 (ref 5–15)
BUN: 17 mg/dL (ref 6–20)
CO2: 23 mmol/L (ref 22–32)
Calcium: 9.6 mg/dL (ref 8.9–10.3)
Chloride: 106 mmol/L (ref 101–111)
Creatinine, Ser: 1.26 mg/dL — ABNORMAL HIGH (ref 0.61–1.24)
GFR calc Af Amer: >60 mL/min (ref >60)
GFR calc non Af Amer: >60 mL/min (ref >60)
Glucose, Bld: 163 mg/dL — ABNORMAL HIGH (ref 65–99)
Potassium: 4.2 mmol/L (ref 3.5–5.1)
Sodium: 139 mmol/L (ref 135–145)
Total Bilirubin: 1.5 mg/dL — ABNORMAL HIGH (ref 0.3–1.2)
Total Protein: 7.4 g/dL (ref 6.5–8.1)

## 2016-09-29 LAB — PROTIME-INR
INR: 3.14
Prothrombin Time: 32.9 seconds — ABNORMAL HIGH (ref 11.4–15.2)

## 2016-09-29 LAB — CBC
HCT: 43.8 % (ref 39.0–52.0)
Hemoglobin: 15 g/dL (ref 13.0–17.0)
MCH: 30.5 pg (ref 26.0–34.0)
MCHC: 34.2 g/dL (ref 30.0–36.0)
MCV: 89.2 fL (ref 78.0–100.0)
Platelets: 185 10*3/uL (ref 150–400)
RBC: 4.91 MIL/uL (ref 4.22–5.81)
RDW: 12.5 % (ref 11.5–15.5)
WBC: 10 10*3/uL (ref 4.0–10.5)

## 2016-09-29 LAB — LIPASE, BLOOD: Lipase: 18 U/L (ref 11–51)

## 2016-09-29 MED ORDER — ACETAMINOPHEN 325 MG PO TABS
650.0000 mg | ORAL_TABLET | Freq: Once | ORAL | Status: AC
  Administered 2016-09-29: 650 mg via ORAL
  Filled 2016-09-29: qty 2

## 2016-09-29 MED ORDER — MECLIZINE HCL 25 MG PO TABS
25.0000 mg | ORAL_TABLET | Freq: Once | ORAL | Status: AC
  Administered 2016-09-29: 25 mg via ORAL
  Filled 2016-09-29: qty 1

## 2016-09-29 MED ORDER — ONDANSETRON 4 MG PO TBDP
4.0000 mg | ORAL_TABLET | Freq: Once | ORAL | Status: AC | PRN
  Administered 2016-09-29: 4 mg via ORAL

## 2016-09-29 MED ORDER — ONDANSETRON 4 MG PO TBDP
ORAL_TABLET | ORAL | Status: AC
  Filled 2016-09-29: qty 1

## 2016-09-29 MED ORDER — ONDANSETRON 4 MG PO TBDP: 4.0000 mg | ORAL_TABLET | Freq: Three times a day (TID) | ORAL | 0 refills | Status: DC | PRN

## 2016-09-29 MED ORDER — ONDANSETRON 4 MG PO TBDP
4.0000 mg | ORAL_TABLET | Freq: Once | ORAL | Status: AC
  Administered 2016-09-29: 4 mg via ORAL
  Filled 2016-09-29: qty 1

## 2016-09-29 MED ORDER — MECLIZINE HCL 25 MG PO TABS: 25.0000 mg | ORAL_TABLET | Freq: Three times a day (TID) | ORAL | 0 refills | Status: DC | PRN

## 2016-09-29 NOTE — ED Notes (Signed)
ED Provider at bedside. 

## 2016-09-29 NOTE — ED Triage Notes (Signed)
Pt states "I've been having pain and ringing in my left ear. Its making me feel dizzy and nauseated. I've had vertigo before." Pt dry heaving in triage.
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
## 2016-10-02 ENCOUNTER — Other Ambulatory Visit: Payer: Self-pay | Admitting: Family Medicine

## 2016-10-10 ENCOUNTER — Other Ambulatory Visit (HOSPITAL_COMMUNITY): Payer: Self-pay | Admitting: Internal Medicine

## 2016-10-26 ENCOUNTER — Encounter: Payer: Self-pay | Admitting: Thoracic Surgery (Cardiothoracic Vascular Surgery)

## 2016-10-26 ENCOUNTER — Other Ambulatory Visit: Payer: Self-pay

## 2016-11-02 ENCOUNTER — Inpatient Hospital Stay: Admission: RE | Admit: 2016-11-02 | Payer: Self-pay | Source: Ambulatory Visit

## 2016-11-02 ENCOUNTER — Encounter: Payer: BLUE CROSS/BLUE SHIELD | Admitting: Thoracic Surgery (Cardiothoracic Vascular Surgery)

## 2016-11-20 ENCOUNTER — Ambulatory Visit (INDEPENDENT_AMBULATORY_CARE_PROVIDER_SITE_OTHER): Payer: BLUE CROSS/BLUE SHIELD | Admitting: *Deleted

## 2016-11-20 DIAGNOSIS — Z952 Presence of prosthetic heart valve: Secondary | ICD-10-CM

## 2016-11-20 DIAGNOSIS — I4891 Unspecified atrial fibrillation: Secondary | ICD-10-CM | POA: Diagnosis not present

## 2016-11-20 LAB — POCT INR: INR: 4.3

## 2016-11-26 ENCOUNTER — Emergency Department (HOSPITAL_BASED_OUTPATIENT_CLINIC_OR_DEPARTMENT_OTHER)
Admit: 2016-11-26 | Discharge: 2016-11-26 | Disposition: A | Discharge status: Home / Self Care | Payer: BLUE CROSS/BLUE SHIELD | Attending: Emergency Medicine | Admitting: Emergency Medicine

## 2016-11-26 ENCOUNTER — Telehealth: Payer: Self-pay | Admitting: Family Medicine

## 2016-11-26 ENCOUNTER — Encounter (HOSPITAL_COMMUNITY): Payer: Self-pay | Admitting: Emergency Medicine

## 2016-11-26 ENCOUNTER — Emergency Department (HOSPITAL_COMMUNITY)
Admission: EM | Admit: 2016-11-26 | Discharge: 2016-11-26 | Disposition: A | Discharge status: Home / Self Care | Payer: BLUE CROSS/BLUE SHIELD | Attending: Emergency Medicine | Admitting: Emergency Medicine

## 2016-11-26 ENCOUNTER — Ambulatory Visit
Admission: RE | Admit: 2016-11-26 | Discharge: 2016-11-26 | Disposition: A | Discharge status: Home / Self Care | Payer: BLUE CROSS/BLUE SHIELD | Source: Ambulatory Visit | Attending: Thoracic Surgery (Cardiothoracic Vascular Surgery) | Admitting: Thoracic Surgery (Cardiothoracic Vascular Surgery)

## 2016-11-26 DIAGNOSIS — I10 Essential (primary) hypertension: Secondary | ICD-10-CM | POA: Insufficient documentation

## 2016-11-26 DIAGNOSIS — M7989 Other specified soft tissue disorders: Secondary | ICD-10-CM

## 2016-11-26 DIAGNOSIS — I7121 Aneurysm of the ascending aorta, without rupture: Secondary | ICD-10-CM

## 2016-11-26 DIAGNOSIS — I2510 Atherosclerotic heart disease of native coronary artery without angina pectoris: Secondary | ICD-10-CM | POA: Insufficient documentation

## 2016-11-26 DIAGNOSIS — Z7982 Long term (current) use of aspirin: Secondary | ICD-10-CM | POA: Insufficient documentation

## 2016-11-26 DIAGNOSIS — M79601 Pain in right arm: Secondary | ICD-10-CM | POA: Insufficient documentation

## 2016-11-26 DIAGNOSIS — Z79899 Other long term (current) drug therapy: Secondary | ICD-10-CM | POA: Insufficient documentation

## 2016-11-26 DIAGNOSIS — I712 Thoracic aortic aneurysm, without rupture: Secondary | ICD-10-CM

## 2016-11-26 MED ORDER — IOPAMIDOL (ISOVUE-370) INJECTION 76%
75.0000 mL | Freq: Once | INTRAVENOUS | Status: AC | PRN
  Administered 2016-11-26: 75 mL via INTRAVENOUS

## 2016-11-26 NOTE — Progress Notes (Signed)
*  Preliminary Results* Right upper extremity venous duplex completed. Right upper extremity is negative for deep and superficial vein thrombosis.  11/26/2016 6:05 PM  Gertie FeyMichelle Cristoval Teall, BS, RVT, RDCS, RDMS
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
## 2016-11-26 NOTE — ED Triage Notes (Addendum)
Pt had CT scan today- and right arm stick once with IV- no contrast in this arm. After CT scan pt's arm began swelling. Arm and hand appear swollen in triage. Palpable pulse. Pt on blood thinners

## 2016-11-26 NOTE — Telephone Encounter (Signed)
fyi

## 2016-11-26 NOTE — ED Notes (Signed)
Verbal order for right upper arm ultrasound from Dr. Denton LankSteinl.

## 2016-11-26 NOTE — Discharge Instructions (Signed)
Please use warm compress to the area about 15-20 minutes 3-5 times a day. Take Tylenol as needed for pain. Please follow up with her primary care provider in the week as needed.  Contact a health care provider if: Your pain is getting worse. Your pain is not relieved with medicines. You lose function in the area of the pain if the pain is in your arms, legs, or neck.

## 2016-11-26 NOTE — ED Notes (Signed)
[place], [place] at bedside 

## 2016-11-26 NOTE — Telephone Encounter (Signed)
Patient Name: Mark [name]  DOB: 05-05-1961    Initial Comment Caller had a had a CT scan and where caller got sticked vein blow up, and arm hurts to bend, pick anything up and arm is swelling.    Nurse Assessment  Nurse: Nicanor Bakeosta, RN, Marylene LandAngela Date/Time Lamount Cohen(Eastern Time): 11/26/2016 3:31:49 PM  Confirm and document reason for call. If symptomatic, describe symptoms. ---Caller had a CT with contrast this am- they stuck him 7x to get an IV in. They were finally able to get the IV in the left arm but the right arm is swelling. The IV stick on the "outside" of the forearm next to the elbow but the vein blew. He states the arm is swelling from his arm to the wrist and there is a bubble to the outside of the elbow.  Does the patient have any new or worsening symptoms? ---Yes  Will a triage be completed? ---Yes  Related visit to physician within the last 2 weeks? ---No  Does the PT have any chronic conditions? (i.e. diabetes, asthma, etc.) ---Yes  List chronic conditions. ---aortic aneurysm, valve replacement - "heart patient"  Is this a behavioral health or substance abuse call? ---No     Guidelines    Guideline Title Affirmed Question Affirmed Notes  IV Site (Skin) Symptoms Arm is swollen, new onset (or leg swelling if IV in lower extremity)    Final Disposition User   Go to ED Now Nicanor Bakeosta, RN, Marylene LandAngela    Referrals  [facility]AC[facility] - [facility]   Disagree/Comply: Comply

## 2016-12-18 ENCOUNTER — Ambulatory Visit (INDEPENDENT_AMBULATORY_CARE_PROVIDER_SITE_OTHER): Payer: BLUE CROSS/BLUE SHIELD | Admitting: *Deleted

## 2016-12-18 DIAGNOSIS — I4891 Unspecified atrial fibrillation: Secondary | ICD-10-CM | POA: Diagnosis not present

## 2016-12-18 DIAGNOSIS — Z7901 Long term (current) use of anticoagulants: Secondary | ICD-10-CM | POA: Diagnosis not present

## 2016-12-18 DIAGNOSIS — Z952 Presence of prosthetic heart valve: Secondary | ICD-10-CM

## 2016-12-18 LAB — POCT INR: INR: 5.3

## 2017-01-02 ENCOUNTER — Other Ambulatory Visit: Payer: Self-pay

## 2017-01-02 ENCOUNTER — Emergency Department (HOSPITAL_COMMUNITY): Payer: BLUE CROSS/BLUE SHIELD

## 2017-01-02 ENCOUNTER — Emergency Department (HOSPITAL_COMMUNITY)
Admission: EM | Admit: 2017-01-02 | Discharge: 2017-01-02 | Disposition: A | Discharge status: Home / Self Care | Payer: BLUE CROSS/BLUE SHIELD | Attending: Emergency Medicine | Admitting: Emergency Medicine

## 2017-01-02 ENCOUNTER — Encounter (HOSPITAL_COMMUNITY): Payer: Self-pay | Admitting: Emergency Medicine

## 2017-01-02 DIAGNOSIS — I2510 Atherosclerotic heart disease of native coronary artery without angina pectoris: Secondary | ICD-10-CM | POA: Insufficient documentation

## 2017-01-02 DIAGNOSIS — I10 Essential (primary) hypertension: Secondary | ICD-10-CM | POA: Insufficient documentation

## 2017-01-02 DIAGNOSIS — Z79899 Other long term (current) drug therapy: Secondary | ICD-10-CM | POA: Insufficient documentation

## 2017-01-02 DIAGNOSIS — Z7982 Long term (current) use of aspirin: Secondary | ICD-10-CM | POA: Insufficient documentation

## 2017-01-02 DIAGNOSIS — R0789 Other chest pain: Secondary | ICD-10-CM | POA: Insufficient documentation

## 2017-01-02 DIAGNOSIS — Z7901 Long term (current) use of anticoagulants: Secondary | ICD-10-CM | POA: Insufficient documentation

## 2017-01-02 LAB — BASIC METABOLIC PANEL
Anion gap: 7 (ref 5–15)
BUN: 16 mg/dL (ref 6–20)
CO2: 24 mmol/L (ref 22–32)
Calcium: 9.3 mg/dL (ref 8.9–10.3)
Chloride: 106 mmol/L (ref 101–111)
Creatinine, Ser: 1.03 mg/dL (ref 0.61–1.24)
GFR calc Af Amer: >60 mL/min (ref >60)
GFR calc non Af Amer: >60 mL/min (ref >60)
Glucose, Bld: 148 mg/dL — ABNORMAL HIGH (ref 65–99)
Potassium: 4 mmol/L (ref 3.5–5.1)
Sodium: 137 mmol/L (ref 135–145)

## 2017-01-02 LAB — CBC
HCT: 39.6 % (ref 39.0–52.0)
Hemoglobin: 13.5 g/dL (ref 13.0–17.0)
MCH: 30.4 pg (ref 26.0–34.0)
MCHC: 34.1 g/dL (ref 30.0–36.0)
MCV: 89.2 fL (ref 78.0–100.0)
Platelets: 165 10*3/uL (ref 150–400)
RBC: 4.44 MIL/uL (ref 4.22–5.81)
RDW: 12.6 % (ref 11.5–15.5)
WBC: 6.4 10*3/uL (ref 4.0–10.5)

## 2017-01-02 LAB — I-STAT TROPONIN, ED
Troponin i, poc: 0.01 ng/mL (ref 0.00–0.08)
Troponin i, poc: 0.01 ng/mL (ref 0.00–0.08)

## 2017-01-02 LAB — PROTIME-INR
INR: 2.69
Prothrombin Time: 29.1 seconds — ABNORMAL HIGH (ref 11.4–15.2)

## 2017-01-02 MED ORDER — IOPAMIDOL (ISOVUE-370) INJECTION 76%
INTRAVENOUS | Status: AC
  Administered 2017-01-02: 100 mL
  Filled 2017-01-02: qty 100

## 2017-01-02 NOTE — ED Notes (Signed)
CT notified that pt has IV access 

## 2017-01-02 NOTE — ED Notes (Signed)
Patient transported to CT 

## 2017-01-02 NOTE — ED Notes (Signed)
Attempted IV access x 2 w/o success. 

## 2017-01-02 NOTE — ED Notes (Signed)
IV nurse currently in room to start IV and will get labs

## 2017-01-02 NOTE — ED Triage Notes (Addendum)
Pt to ED from home c/o mid, non-radiating CP starting this afternoon - intermittent without accompanying symptoms. Pt states he was at rest when it came on, took 81mg  ASA PTA. Pt on coumadin. Denies pain at this time, resp e/u. Skin warm/dry. 

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
## 2017-01-04 ENCOUNTER — Ambulatory Visit (INDEPENDENT_AMBULATORY_CARE_PROVIDER_SITE_OTHER): Payer: BLUE CROSS/BLUE SHIELD | Admitting: Thoracic Surgery (Cardiothoracic Vascular Surgery)

## 2017-01-04 ENCOUNTER — Encounter: Payer: Self-pay | Admitting: Thoracic Surgery (Cardiothoracic Vascular Surgery)

## 2017-01-04 VITALS — BP 149/93 | HR 61 | Resp 16 | Ht 71.0 in | Wt 248.0 lb

## 2017-01-04 DIAGNOSIS — I7121 Aneurysm of the ascending aorta, without rupture: Secondary | ICD-10-CM

## 2017-01-04 DIAGNOSIS — I712 Thoracic aortic aneurysm, without rupture: Secondary | ICD-10-CM

## 2017-01-04 MED ORDER — METOPROLOL SUCCINATE ER 25 MG PO TB24: 25.0000 mg | ORAL_TABLET | Freq: Every day | ORAL | 1 refills | Status: DC

## 2017-01-04 NOTE — Patient Instructions (Addendum)
Begin taking Toprol XL once daily in the mornings.  Continue all other previous medications without any changes at this time.  Discuss this change with Dr. Gala RomneyBensimhon at your next office visit.
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
## 2017-01-08 ENCOUNTER — Other Ambulatory Visit: Payer: Self-pay | Admitting: Internal Medicine

## 2017-01-21 ENCOUNTER — Ambulatory Visit (INDEPENDENT_AMBULATORY_CARE_PROVIDER_SITE_OTHER): Payer: Self-pay | Admitting: Pharmacist

## 2017-01-21 DIAGNOSIS — Z952 Presence of prosthetic heart valve: Secondary | ICD-10-CM

## 2017-01-21 DIAGNOSIS — I4891 Unspecified atrial fibrillation: Secondary | ICD-10-CM

## 2017-01-21 DIAGNOSIS — Z7901 Long term (current) use of anticoagulants: Secondary | ICD-10-CM

## 2017-01-21 LAB — POCT INR: INR: 5.4

## 2017-02-01 ENCOUNTER — Ambulatory Visit (INDEPENDENT_AMBULATORY_CARE_PROVIDER_SITE_OTHER): Payer: Self-pay | Admitting: Family Medicine

## 2017-02-01 ENCOUNTER — Encounter: Payer: Self-pay | Admitting: Family Medicine

## 2017-02-01 DIAGNOSIS — Z0000 Encounter for general adult medical examination without abnormal findings: Secondary | ICD-10-CM | POA: Insufficient documentation

## 2017-02-01 LAB — HEPATIC FUNCTION PANEL
ALT: 29 U/L (ref 0–53)
AST: 19 U/L (ref 0–37)
Albumin: 4.4 g/dL (ref 3.5–5.2)
Alkaline Phosphatase: 56 U/L (ref 39–117)
Bilirubin, Direct: 0.2 mg/dL (ref 0.0–0.3)
Total Bilirubin: 1.1 mg/dL (ref 0.2–1.2)
Total Protein: 6.8 g/dL (ref 6.0–8.3)

## 2017-02-01 LAB — PSA: PSA: 0.46 ng/mL (ref 0.10–4.00)

## 2017-02-01 LAB — LIPID PANEL
Cholesterol: 187 mg/dL (ref 0–200)
HDL: 39.7 mg/dL (ref >39.00)
LDL Cholesterol: 121 mg/dL — ABNORMAL HIGH (ref 0–99)
NonHDL: 147.59
Total CHOL/HDL Ratio: 5
Triglycerides: 134 mg/dL (ref 0.0–149.0)
VLDL: 26.8 mg/dL (ref 0.0–40.0)

## 2017-02-01 LAB — BASIC METABOLIC PANEL
BUN: 16 mg/dL (ref 6–23)
CO2: 30 mEq/L (ref 19–32)
Calcium: 9.3 mg/dL (ref 8.4–10.5)
Chloride: 106 mEq/L (ref 96–112)
Creatinine, Ser: 0.94 mg/dL (ref 0.40–1.50)
GFR: 89.01 mL/min (ref >60.00)
Glucose, Bld: 93 mg/dL (ref 70–99)
Potassium: 4.8 mEq/L (ref 3.5–5.1)
Sodium: 141 mEq/L (ref 135–145)

## 2017-02-01 LAB — CBC WITH DIFFERENTIAL/PLATELET
Basophils Absolute: 0 10*3/uL (ref 0.0–0.1)
Basophils Relative: 0.9 % (ref 0.0–3.0)
Eosinophils Absolute: 0.1 10*3/uL (ref 0.0–0.7)
Eosinophils Relative: 1.9 % (ref 0.0–5.0)
HCT: 40.8 % (ref 39.0–52.0)
Hemoglobin: 13.9 g/dL (ref 13.0–17.0)
Lymphocytes Relative: 40 % (ref 12.0–46.0)
Lymphs Abs: 2 10*3/uL (ref 0.7–4.0)
MCHC: 34.1 g/dL (ref 30.0–36.0)
MCV: 91.5 fl (ref 78.0–100.0)
Monocytes Absolute: 0.5 10*3/uL (ref 0.1–1.0)
Monocytes Relative: 9.1 % (ref 3.0–12.0)
Neutro Abs: 2.4 10*3/uL (ref 1.4–7.7)
Neutrophils Relative %: 48.1 % (ref 43.0–77.0)
Platelets: 166 10*3/uL (ref 150.0–400.0)
RBC: 4.46 Mil/uL (ref 4.22–5.81)
RDW: 13.3 % (ref 11.5–15.5)
WBC: 5 10*3/uL (ref 4.0–10.5)

## 2017-02-01 LAB — TSH: TSH: 1.17 u[IU]/mL (ref 0.35–4.50)

## 2017-02-01 NOTE — Assessment & Plan Note (Signed)
Pt's PE w/ known obesity and mechanical valve.  New this year is the large L knee bursitis (following w/ Dr Dion SaucierLandau).  Due for colonoscopy but on coumadin and unable to stop.  Information given on Cologuard for him to call about pt assistance due to lack of insurance.  UTD on immunizations.  Check labs.  Anticipatory guidance provided.

## 2017-02-01 NOTE — Progress Notes (Signed)
   Subjective:    Patient ID: Mark [name], male    DOB: [birth year], 56 y.o.   MRN: [id number]  HPI CPE- due for colonoscopy but unable due to Coumadin.  Pt open to idea of cologuard.  UTD on Tdap.  No concerns today.  Not using CPAP machine- 'it is in repairs'.   Review of Systems Patient reports no vision/hearing changes, anorexia, fever ,adenopathy, persistant/recurrent hoarseness, swallowing issues, edema, persistant/recurrent cough, hemoptysis, dyspnea (rest,exertional, paroxysmal nocturnal), gastrointestinal  bleeding (melena, rectal bleeding), abdominal pain, excessive heart burn, GU symptoms (dysuria, hematuria, voiding/incontinence issues) syncope, focal weakness, memory loss, numbness & tingling, skin/hair/nail changes, depression, anxiety, abnormal bruising/bleeding, musculoskeletal symptoms/signs.   Chronic CP- Cards is aware Palpitations- chronic problem, hx of Afib, cards is aware    Objective:   Physical Exam General Appearance:    Alert, cooperative, no distress, appears stated age  Head:    Normocephalic, without obvious abnormality, atraumatic  Eyes:    PERRL, conjunctiva/corneas clear, EOM's intact, fundi    benign, both eyes       Ears:    Normal TM's and external ear canals, both ears  Nose:   Nares normal, septum midline, mucosa normal, no drainage   or sinus tenderness  Throat:   Lips, mucosa, and tongue normal; teeth and gums normal  Neck:   Supple, symmetrical, trachea midline, no adenopathy;       thyroid:  No enlargement/tenderness/nodules  Back:     Symmetric, no curvature, ROM normal, no CVA tenderness  Lungs:     Clear to auscultation bilaterally, respirations unlabored  Chest wall:    No tenderness or deformity  Heart:    Regular rate and rhythm, S1 and S2 normal, mechanical valve heard  Abdomen:     Soft, non-tender, bowel sounds active all four quadrants,    no masses, no organomegaly  Genitalia:    Deferred at pt's request  Rectal:      Extremities:   Large L knee bursa- no TTP, atraumatic, no cyanosis or edema  Pulses:   2+ and symmetric all extremities  Skin:   Skin color, texture, turgor normal, no rashes or lesions  Lymph nodes:   Cervical, supraclavicular, and axillary nodes normal  Neurologic:   CNII-XII intact. Normal strength, sensation and reflexes      throughout          Assessment & Plan:

## 2017-02-01 NOTE — Progress Notes (Signed)
Pre visit review using our clinic review tool, if applicable. No additional management support is needed unless otherwise documented below in the visit note. 

## 2017-02-01 NOTE — Patient Instructions (Signed)
Follow up in 6 months to recheck cholesterol We'll notify you of your lab results and make any changes if needed Continue to work on healthy diet and regular exercise- you can do it! Once you get your CPAP back, please wear it regularly! Call the Cologuard people about patient assistance since you don't have insurance at this time.  I can order this once you give the go-ahead Call with any questions or concerns Have a great summer!!!

## 2017-02-09 ENCOUNTER — Other Ambulatory Visit (HOSPITAL_COMMUNITY): Payer: Self-pay | Admitting: Internal Medicine

## 2017-02-12 ENCOUNTER — Other Ambulatory Visit: Payer: Self-pay | Admitting: Family Medicine

## 2017-02-24 ENCOUNTER — Ambulatory Visit (HOSPITAL_COMMUNITY)
Admission: RE | Admit: 2017-02-24 | Discharge: 2017-02-24 | Disposition: A | Discharge status: Home / Self Care | Payer: Self-pay | Source: Ambulatory Visit | Attending: Internal Medicine | Admitting: Internal Medicine

## 2017-02-24 VITALS — BP 150/70 | HR 68 | Wt 246.8 lb

## 2017-02-24 DIAGNOSIS — Z9119 Patient's noncompliance with other medical treatment and regimen: Secondary | ICD-10-CM | POA: Insufficient documentation

## 2017-02-24 DIAGNOSIS — I10 Essential (primary) hypertension: Secondary | ICD-10-CM | POA: Insufficient documentation

## 2017-02-24 DIAGNOSIS — Z7982 Long term (current) use of aspirin: Secondary | ICD-10-CM | POA: Insufficient documentation

## 2017-02-24 DIAGNOSIS — I2510 Atherosclerotic heart disease of native coronary artery without angina pectoris: Secondary | ICD-10-CM | POA: Insufficient documentation

## 2017-02-24 DIAGNOSIS — Q2543 Congenital aneurysm of aorta: Secondary | ICD-10-CM | POA: Insufficient documentation

## 2017-02-24 DIAGNOSIS — I480 Paroxysmal atrial fibrillation: Secondary | ICD-10-CM | POA: Insufficient documentation

## 2017-02-24 DIAGNOSIS — I350 Nonrheumatic aortic (valve) stenosis: Secondary | ICD-10-CM | POA: Insufficient documentation

## 2017-02-24 DIAGNOSIS — Z7901 Long term (current) use of anticoagulants: Secondary | ICD-10-CM | POA: Insufficient documentation

## 2017-02-24 DIAGNOSIS — E785 Hyperlipidemia, unspecified: Secondary | ICD-10-CM | POA: Insufficient documentation

## 2017-02-24 DIAGNOSIS — Z79899 Other long term (current) drug therapy: Secondary | ICD-10-CM | POA: Insufficient documentation

## 2017-02-24 DIAGNOSIS — Z952 Presence of prosthetic heart valve: Secondary | ICD-10-CM | POA: Insufficient documentation

## 2017-02-24 DIAGNOSIS — G4733 Obstructive sleep apnea (adult) (pediatric): Secondary | ICD-10-CM | POA: Insufficient documentation

## 2017-02-24 DIAGNOSIS — I4891 Unspecified atrial fibrillation: Secondary | ICD-10-CM

## 2017-02-24 MED ORDER — CARVEDILOL 6.25 MG PO TABS: 6.2500 mg | ORAL_TABLET | Freq: Two times a day (BID) | ORAL | 3 refills | Status: DC

## 2017-02-24 NOTE — Patient Instructions (Signed)
Start Carvedilol 6.25 mg Twice daily   Your physician has requested that you have an echocardiogram. Echocardiography is a painless test that uses sound waves to create images of your heart. It provides your doctor with information about the size and shape of your heart and how well your heart's chambers and valves are working. This procedure takes approximately one hour. There are no restrictions for this procedure.  Your physician recommends that you schedule a follow-up appointment in: 3-4 weeks with Fara ChuteErika, Pharm D  Your physician recommends that you schedule a follow-up appointment in: 2 months

## 2017-02-24 NOTE — Addendum Note (Signed)
Encounter addended by: Noralee SpaceSchub, Tashima Scarpulla M, RN on: 02/24/2017 10:22 AM<BR>    Actions taken: Order list changed, Diagnosis association updated, Sign clinical note

## 2017-02-24 NOTE — Progress Notes (Signed)
Patient ID: Mark [name], male   DOB: 02-Apr-1961, 56 y.o.   MRN: [id number]    Advanced Heart Failure Clinic Note   PCP: [name] HF: Dr. Gala Romney   HPI:  Mark [name] is a very pleasant 56 year old male with a history of bicuspid aortic valve complicated by endocarditis.  He is status post St. Jude aortic valve replacement in 1983.  He also has a history of hyperlipidemia and PAF s/p DC-CV in 9/11. OSA noncompliant with CPAP.   Over the past few years year, he has had some progression in the gradients across his valve.  He underwent cardiac catheterization in May 2009 which showed normal coronary arteries.  We did not cross the valve, obviously this is a mechanical valve.  However, the valve leaflets were seen to be opening well on fluoroscopy.  He did undergo a TEE.  While there was some turbulence around the valve, the leaflets seem to be moving well.  There was no obvious pannus formation.  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 He was seen by Dr. [name] who agreed  with continue watchul waiting. His last echo was 1/14 EF 55-60% [name] gradient across AVR at  Has struggled with AF/palpitations.  Has seen Dr. Johney Frame and had two ablations in 8/13 and 3/14.  Continues to follow with Dr. Johney Frame who is following his Link ILR. Last vitsit in 1/18 showed AF burden < 1% of AAD therapy. Also occasionall symptomatic PACs/PVCs  Last saw Dr. [name] in 4.18 CT chest showed [name] aneurysmal dilation of aortic root at 5.0 cm. BP also high. Recommended starting b-blocker.  Echo 12/14/2014 LVEF 55-60%, Aortic valve gradient 44 to 60 mm Hg, increased from 22 to 35 mm Hg. Echo 4/17 EF 60-65%  AoV Mean gradient (S): 34 mm Hg. Peak gradient (S): 63 mm Hg.  He returns for yearly follow up. Feels ok. Has good days and bad days. Gets more SOB with the heat. No edema, orthopnea, PND. Occasional transient CP. Still with  brief palpitations almost daily. No syncope and presyncope. Use abx when goes to dentist. SBP at home 170/90s  ROS: All systems negative except as listed in HPI, PMH and Problem List.  Past Medical History:  Diagnosis Date  . Aortic stenosis    a. due to bicuspid AoV; 1983 S/p mechanical AVR (St. Jude) - chronic coumadin with INR run between 3.5-4.5;  b. 03/2012 Echo: EF 60%, nl wall motion, mod dil LA.  Marland Kitchen Ascending aortic aneurysm (HCC)   . Atrial tachycardia (HCC)    a. admitted 07/2012  . Coronary artery disease   . Gallstones   . H/O cardiac catheterization    a. 12/2007 Cath: nl cors.  . Headache(784.0)   . 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 Hypertension   . OSA (obstructive sleep apnea)    a. noncompliant with CPAP  . Paroxysmal atrial fibrillation (HCC)    s/p afib ablation x 2    Current Outpatient Prescriptions  Medication Sig Dispense Refill  . aspirin EC 81 MG tablet Take 81 mg by mouth at bedtime.     Marland Kitchen diltiazem (CARDIZEM) 60 MG tablet Take one tablet by mouth every 6 hours as needed for afib (Patient taking differently: Take 60 mg by mouth every 6 (six) hours as needed (for afib). ) 30 tablet 1  . lisinopril (PRINIVIL,ZESTRIL) 40 MG tablet TAKE  1/2 TABLET BY MOUTH ONCE DAILY 15 tablet 3  . meclizine (ANTIVERT) 25 MG tablet Take 1 tablet (25 mg total) by mouth 3 (three) times daily as needed for dizziness. 30 tablet 0  . ondansetron (ZOFRAN ODT) 4 MG disintegrating tablet Take 1 tablet (4 mg total) by mouth every 8 (eight) hours as needed for nausea or vomiting. 20 tablet 0  . simvastatin (ZOCOR) 20 MG tablet TAKE 1 TABLET (20 MG TOTAL) BY MOUTH AT BEDTIME. 90 tablet 0  . verapamil (CALAN-SR) 120 MG CR tablet TAKE 1 TABLET (120 MG TOTAL) BY MOUTH AT BEDTIME. 30 tablet 6  . warfarin (COUMADIN) 10 MG tablet TAKE AS DIRECTED BY COUMADIN CLINIC 40 tablet 2  . metoprolol succinate (TOPROL-XL) 25 MG 24 hr tablet Take 1 tablet (25 mg total) by mouth daily. (Patient  not taking: Reported on 02/01/2017) 30 tablet 1   No current facility-administered medications for this encounter.    EKG: Sinus brady 59 bpm  PHYSICAL EXAM: Vitals:   02/24/17 0945  BP: (!) 150/70  Pulse: 68  SpO2: 99%  Weight: 246 lb 12.8 oz (111.9 kg)   Wt Readings from Last 3 Encounters:  02/24/17 246 lb 12.8 oz (111.9 kg)  02/01/17 244 lb 8 oz (110.9 kg)  01/04/17 248 lb (112.5 kg)     General:  Well appearing. No resp difficulty HEENT: normal Neck: supple. no JVD. Carotids 2+ bilat; no bruits. No lymphadenopathy or thryomegaly appreciated. Cor: PMI nondisplaced. Regular rate & rhythm. No rubs, gallops or murmurs.  mechanical s2. Crisp S2. 2/6 SEM at RSB Lungs: clear Abdomen: obese soft, nontender, nondistended. No hepatosplenomegaly. No bruits or masses. Good bowel sounds. Extremities: no cyanosis, clubbing, rash, edema Neuro: alert & orientedx3, cranial nerves grossly intact. moves all 4 extremities w/o difficulty. Affect pleasant   ASSESSMENT & PLAN: 1. Aortic stenosis s/p AVR - Due for repeat echo.  Gradients slightly increased on last echo.  Valve sounds good on exam. - Emphasized use abx for SBE prophylaxis - Continue coumadin and ECASA 81 2. HTN - BP very high. Will add carvedilol 6.25 bid in setting of dilated AoRoot. Will likely need another agent and/or titration of lisinopril soon. Will have him f/u in PharmD clinic 3-4 weeks to recheck.  3. Aortic root aneurysm - Followed by Dr. [name] as above, Needs better BP control  4. Lipids - Followed by Dr. Beverely Lowabori. LDL remains high (121). Now simva. Unable to tolerate lipirtor or crestor. Consider adding Zetia or PCSK-9 inhibitor 5. Atrial fibrillation - Followed by Dr. [name]. See above.    Valaria Kohut,MD 10:02 AM

## 2017-02-24 NOTE — Progress Notes (Signed)
Advanced Heart Failure Medication Review by a Pharmacist  Does the patient  feel that his/her medications are working for him/her?  yes  Has the patient been experiencing any side effects to the medications prescribed?  no  Does the patient measure his/her own blood pressure or blood glucose at home?  yes   Does the patient have any problems obtaining medications due to transportation or finances?   no  Understanding of regimen: good Understanding of indications: good Potential of compliance: good Patient understands to avoid NSAIDs. Patient understands to avoid decongestants.  Issues to address at subsequent visits: none.    Pharmacist comments: Mark [name] is a pleasant 56 y/o male who presents without medication bottles or list but good recollection of medication regimen. He endorses good adherence to his medication regimen. Patient had no medication-related questions or concerns for me at this time.   [name] PharmD Candidate  Time with patient: 10 minutes Preparation and documentation time: 5 minutes Total time: 15 minutes

## 2017-03-02 ENCOUNTER — Ambulatory Visit (HOSPITAL_COMMUNITY): Payer: Self-pay | Attending: Cardiovascular Disease

## 2017-03-02 ENCOUNTER — Other Ambulatory Visit: Payer: Self-pay

## 2017-03-02 DIAGNOSIS — Z952 Presence of prosthetic heart valve: Secondary | ICD-10-CM | POA: Insufficient documentation

## 2017-03-17 ENCOUNTER — Ambulatory Visit (INDEPENDENT_AMBULATORY_CARE_PROVIDER_SITE_OTHER): Payer: Self-pay | Admitting: Physician Assistant

## 2017-03-17 ENCOUNTER — Ambulatory Visit (HOSPITAL_COMMUNITY)
Admission: RE | Admit: 2017-03-17 | Discharge: 2017-03-17 | Disposition: A | Discharge status: Home / Self Care | Payer: Self-pay | Source: Ambulatory Visit | Attending: Cardiology | Admitting: Cardiology

## 2017-03-17 ENCOUNTER — Encounter: Payer: Self-pay | Admitting: Physician Assistant

## 2017-03-17 VITALS — BP 126/80 | HR 53 | Temp 98.3°F | Resp 14 | Ht 71.0 in | Wt 246.0 lb

## 2017-03-17 DIAGNOSIS — Z952 Presence of prosthetic heart valve: Secondary | ICD-10-CM | POA: Insufficient documentation

## 2017-03-17 DIAGNOSIS — I10 Essential (primary) hypertension: Secondary | ICD-10-CM | POA: Insufficient documentation

## 2017-03-17 DIAGNOSIS — J208 Acute bronchitis due to other specified organisms: Secondary | ICD-10-CM

## 2017-03-17 DIAGNOSIS — B9689 Other specified bacterial agents as the cause of diseases classified elsewhere: Secondary | ICD-10-CM

## 2017-03-17 DIAGNOSIS — G4733 Obstructive sleep apnea (adult) (pediatric): Secondary | ICD-10-CM | POA: Insufficient documentation

## 2017-03-17 DIAGNOSIS — I480 Paroxysmal atrial fibrillation: Secondary | ICD-10-CM | POA: Insufficient documentation

## 2017-03-17 DIAGNOSIS — Q2543 Congenital aneurysm of aorta: Secondary | ICD-10-CM | POA: Insufficient documentation

## 2017-03-17 DIAGNOSIS — Z7901 Long term (current) use of anticoagulants: Secondary | ICD-10-CM

## 2017-03-17 DIAGNOSIS — E785 Hyperlipidemia, unspecified: Secondary | ICD-10-CM | POA: Insufficient documentation

## 2017-03-17 LAB — POCT INR: INR: 6

## 2017-03-17 MED ORDER — BENZONATATE 100 MG PO CAPS: 100.0000 mg | ORAL_CAPSULE | Freq: Two times a day (BID) | ORAL | 0 refills | Status: DC | PRN

## 2017-03-17 MED ORDER — LISINOPRIL 40 MG PO TABS: 40.0000 mg | ORAL_TABLET | Freq: Every day | ORAL | 5 refills | Status: DC

## 2017-03-17 MED ORDER — AMOXICILLIN 875 MG PO TABS: 875.0000 mg | ORAL_TABLET | Freq: Two times a day (BID) | ORAL | 0 refills | Status: DC

## 2017-03-17 NOTE — Patient Instructions (Addendum)
It was great to see you today!  Please INCREASE your Lisinopril to 40 mg (1 tablet) daily.   Please measure you BP at the same time every day and bring a log with you to your next visit.   You are scheduled for another visit with Cicero DuckErika, PharmD on 8/8 at 2:00 PM.   Please keep your appointment with Dr. Gala RomneyBensimhon on 8/29.

## 2017-03-17 NOTE — Progress Notes (Signed)
Patient presents to clinic today c/o 3 weeks of chest congestion with a cough that is sometimes productive but now mainly dry. Denies fever, chills, malaise or fatigue. Patient endorses a sore throat initially that has since resolved. Endorses worsening chest congestion with occasional wheeze. Denies sinus pressure, sinus pain, ear pain. Denies chest pain. Has taken OTC Robitussin and Coricidine HBP.   Is currently on INR taking coumadin at 10 mg nightly. Is overdue for INR check. .  Lab Results  Component Value Date   INR 5.4 01/21/2017   INR 2.69 01/02/2017   INR 5.3 12/18/2016   PROTIME 18.5 01/25/2009   .   Past Medical History:  Diagnosis Date  . Aortic stenosis    a. due to bicuspid AoV; 1983 S/p mechanical AVR (St. Jude) - chronic coumadin with INR run between 3.5-4.5;  b. 03/2012 Echo: EF 60%, nl wall motion, mod dil LA.  Marland Kitchen Ascending aortic aneurysm ([place])   . Atrial tachycardia ([place])    a. admitted 07/2012  . Coronary artery disease   . Gallstones   . H/O cardiac catheterization    a. 12/2007 Cath: nl cors.  . Headache(784.0)   . Hepatitis A    a. as a child (from [place])  . Hypertension   . OSA (obstructive sleep apnea)    a. noncompliant with CPAP  . Paroxysmal atrial fibrillation (HCC)    s/p afib ablation x 2    Current Outpatient Prescriptions on File Prior to Visit  Medication Sig Dispense Refill  . aspirin EC 81 MG tablet Take 81 mg by mouth at bedtime.     . carvedilol (COREG) 6.25 MG tablet Take 1 tablet (6.25 mg total) by mouth 2 (two) times daily. 60 tablet 3  . diltiazem (CARDIZEM) 60 MG tablet Take one tablet by mouth every 6 hours as needed for afib (Patient taking differently: Take 60 mg by mouth every 6 (six) hours as needed (for afib). ) 30 tablet 1  . lisinopril (PRINIVIL,ZESTRIL) 40 MG tablet TAKE 1/2 TABLET BY MOUTH ONCE DAILY 15 tablet 3  . meclizine (ANTIVERT) 25 MG tablet Take 1 tablet (25 mg total) by mouth 3 (three) times daily as needed for  dizziness. 30 tablet 0  . ondansetron (ZOFRAN ODT) 4 MG disintegrating tablet Take 1 tablet (4 mg total) by mouth every 8 (eight) hours as needed for nausea or vomiting. 20 tablet 0  . simvastatin (ZOCOR) 20 MG tablet TAKE 1 TABLET (20 MG TOTAL) BY MOUTH AT BEDTIME. 90 tablet 0  . verapamil (CALAN-SR) 120 MG CR tablet TAKE 1 TABLET (120 MG TOTAL) BY MOUTH AT BEDTIME. 30 tablet 6  . warfarin (COUMADIN) 10 MG tablet TAKE AS DIRECTED BY COUMADIN CLINIC 40 tablet 2   No current facility-administered medications on file prior to visit.     Allergies  Allergen Reactions  . Codeine Nausea And Vomiting  . Morphine And Related Nausea And Vomiting  . Percocet [Oxycodone-Acetaminophen] Nausea And Vomiting    Family History  Problem Relation Age of Onset  . Lung cancer Father   . Bradycardia Mother     Social History   Social History  . Marital status: Married    Spouse name: N/A  . Number of children: 5  . Years of education: N/A   Occupational History  . heating & air    Social History Main Topics  . Smoking status: Never Smoker  . Smokeless tobacco: Never Used  . Alcohol use No  .  Drug use: No  . Sexual activity: Yes   Other Topics Concern  . None   Social History Narrative   Lives in [place], [place] with wife.  Works as a Insurance underwriter   Review of Systems - See HPI.  All other ROS are negative.  BP 126/80   Pulse (!) 53   Temp 98.3 F (36.8 C) (Oral)   Resp 14   Ht 5' 11" (1.803 m)   Wt 246 lb (111.6 kg)   SpO2 98%   BMI 34.31 kg/m   Physical Exam  Constitutional: He is oriented to person, place, and time and well-developed, well-nourished, and in no distress.  HENT:  Head: Normocephalic and atraumatic.  Right Ear: External ear normal.  Left Ear: External ear normal.  Nose: Nose normal.  Mouth/Throat: Oropharynx is clear and moist. No oropharyngeal exudate.  TM within normal limits bilaterally.  Eyes: Conjunctivae are normal.  Neck: Neck supple.    Cardiovascular: Normal rate, regular rhythm, normal heart sounds and intact distal pulses.   Pulmonary/Chest: Effort normal and breath sounds normal. No respiratory distress. He has no wheezes. He has no rales. He exhibits no tenderness.  Neurological: He is alert and oriented to person, place, and time.  Skin: Skin is warm and dry. No rash noted.  Psychiatric: Affect normal.  Vitals reviewed.   Recent Results (from the past 2160 hour(s))  POCT INR     Status: None   Collection Time: 12/18/16  8:08 AM  Result Value Ref Range   INR 5.3   Basic metabolic panel     Status: Abnormal   Collection Time: 01/02/17 12:49 AM  Result Value Ref Range   Sodium 137 135 - 145 mmol/L   Potassium 4.0 3.5 - 5.1 mmol/L   Chloride 106 101 - 111 mmol/L   CO2 24 22 - 32 mmol/L   Glucose, Bld 148 (H) 65 - 99 mg/dL   BUN 16 6 - 20 mg/dL   Creatinine, Ser 1.03 0.61 - 1.24 mg/dL   Calcium 9.3 8.9 - 10.3 mg/dL   GFR calc non Af Amer >60 >60 mL/min   GFR calc Af Amer >60 >60 mL/min    Comment: (NOTE) The eGFR has been calculated using the CKD EPI equation. This calculation has not been validated in all clinical situations. eGFR's persistently <60 mL/min signify possible Chronic Kidney Disease.    Anion gap 7 5 - 15  CBC     Status: None   Collection Time: 01/02/17 12:49 AM  Result Value Ref Range   WBC 6.4 4.0 - 10.5 K/uL   RBC 4.44 4.22 - 5.81 MIL/uL   Hemoglobin 13.5 13.0 - 17.0 g/dL   HCT 39.6 39.0 - 52.0 %   MCV 89.2 78.0 - 100.0 fL   MCH 30.4 26.0 - 34.0 pg   MCHC 34.1 30.0 - 36.0 g/dL   RDW 12.6 11.5 - 15.5 %   Platelets 165 150 - 400 K/uL  Protime-INR (order if Patient is taking Coumadin / Warfarin)     Status: Abnormal   Collection Time: 01/02/17 12:49 AM  Result Value Ref Range   Prothrombin Time 29.1 (H) 11.4 - 15.2 seconds   INR 2.69   I-stat troponin, ED     Status: None   Collection Time: 01/02/17  1:11 AM  Result Value Ref Range   Troponin i, poc 0.01 0.00 - 0.08 ng/mL    Comment 3  Comment: Due to the release kinetics of cTnI, a negative result within the first hours of the onset of symptoms does not rule out myocardial infarction with certainty. If myocardial infarction is still suspected, repeat the test at appropriate intervals.   I-stat troponin, ED     Status: None   Collection Time: 01/02/17  6:06 AM  Result Value Ref Range   Troponin i, poc 0.01 0.00 - 0.08 ng/mL   Comment 3            Comment: Due to the release kinetics of cTnI, a negative result within the first hours of the onset of symptoms does not rule out myocardial infarction with certainty. If myocardial infarction is still suspected, repeat the test at appropriate intervals.   POCT INR     Status: None   Collection Time: 01/21/17  8:13 AM  Result Value Ref Range   INR 5.4   Lipid panel     Status: Abnormal   Collection Time: 02/01/17 10:37 AM  Result Value Ref Range   Cholesterol 187 0 - 200 mg/dL    Comment: ATP III Classification       Desirable:  < 200 mg/dL               Borderline High:  200 - 239 mg/dL          High:  > = 240 mg/dL   Triglycerides 134.0 0.0 - 149.0 mg/dL    Comment: Normal:  <150 mg/dLBorderline High:  150 - 199 mg/dL   HDL 39.70 >39.00 mg/dL   VLDL 26.8 0.0 - 40.0 mg/dL   LDL Cholesterol 121 (H) 0 - 99 mg/dL   Total CHOL/HDL Ratio 5     Comment:                Men          Women1/2 Average Risk     3.4          3.3Average Risk          5.0          4.42X Average Risk          9.6          7.13X Average Risk          15.0          11.0                       NonHDL 147.59     Comment: NOTE:  Non-HDL goal should be 30 mg/dL higher than patient's LDL goal (i.e. LDL goal of < 70 mg/dL, would have non-HDL goal of < 100 mg/dL)  Basic metabolic panel     Status: None   Collection Time: 02/01/17 10:37 AM  Result Value Ref Range   Sodium 141 135 - 145 mEq/L   Potassium 4.8 3.5 - 5.1 mEq/L   Chloride 106 96 - 112 mEq/L   CO2 30 19 - 32 mEq/L    Glucose, Bld 93 70 - 99 mg/dL   BUN 16 6 - 23 mg/dL   Creatinine, Ser 0.94 0.40 - 1.50 mg/dL   Calcium 9.3 8.4 - 10.5 mg/dL   GFR 89.01 >60.00 mL/min  TSH     Status: None   Collection Time: 02/01/17 10:37 AM  Result Value Ref Range   TSH 1.17 0.35 - 4.50 uIU/mL  Hepatic function panel     Status: None   Collection Time: 02/01/17  10:37 AM  Result Value Ref Range   Total Bilirubin 1.1 0.2 - 1.2 mg/dL   Bilirubin, Direct 0.2 0.0 - 0.3 mg/dL   Alkaline Phosphatase 56 39 - 117 U/L   AST 19 0 - 37 U/L   ALT 29 0 - 53 U/L   Total Protein 6.8 6.0 - 8.3 g/dL   Albumin 4.4 3.5 - 5.2 g/dL  CBC with Differential/Platelet     Status: None   Collection Time: 02/01/17 10:37 AM  Result Value Ref Range   WBC 5.0 4.0 - 10.5 K/uL   RBC 4.46 4.22 - 5.81 Mil/uL   Hemoglobin 13.9 13.0 - 17.0 g/dL   HCT 40.8 39.0 - 52.0 %   MCV 91.5 78.0 - 100.0 fl   MCHC 34.1 30.0 - 36.0 g/dL   RDW 13.3 11.5 - 15.5 %   Platelets 166.0 150.0 - 400.0 K/uL   Neutrophils Relative % 48.1 43.0 - 77.0 %   Lymphocytes Relative 40.0 12.0 - 46.0 %   Monocytes Relative 9.1 3.0 - 12.0 %   Eosinophils Relative 1.9 0.0 - 5.0 %   Basophils Relative 0.9 0.0 - 3.0 %   Neutro Abs 2.4 1.4 - 7.7 K/uL   Lymphs Abs 2.0 0.7 - 4.0 K/uL   Monocytes Absolute 0.5 0.1 - 1.0 K/uL   Eosinophils Absolute 0.1 0.0 - 0.7 K/uL   Basophils Absolute 0.0 0.0 - 0.1 K/uL  PSA     Status: None   Collection Time: 02/01/17 10:37 AM  Result Value Ref Range   PSA 0.46 0.10 - 4.00 ng/mL    Assessment/Plan: 1. Long term (current) use of anticoagulants Patient is 6 weeks overdue for INR check with coumadin clinic. POCT INR today at 6. He is to hold next two doses of coumadin, then restart. Follow-up 1 week with coumadin clinic for repeat INR. - POCT INR  2. Acute bacterial bronchitis Discussed case with supervising MD due to INR level. Recommend start Amoxcillin with the changes in coumadin dosing. Supportive measures discussed. Rx Tessalon for  cough.   Leeanne Rio, PA-C

## 2017-03-17 NOTE — Progress Notes (Signed)
Pre visit review using our clinic review tool, if applicable. No additional management support is needed unless otherwise documented below in the visit note. 

## 2017-03-17 NOTE — Progress Notes (Signed)
HF MD: BENSIMHON  HPI:  Mark [name] is a very pleasant 56 year old Caucasian male with a history of bicuspid aortic valve complicated by endocarditis.  He is status post St. Jude aortic valve replacement in 1983.  He also has a history of hyperlipidemia and PAF s/p DC-CV in 9/11. OSA noncompliant with CPAP.   Over the past few years year, he has had some progression in the gradients across his valve.  He underwent cardiac catheterization in May 2009 which showed normal coronary arteries.  We did not cross the valve, obviously this is a mechanical valve.  However, the valve leaflets were seen to be opening well on fluoroscopy.  He did undergo a TEE.  While there was some turbulence around the valve, the leaflets seem to be moving well.  There was no obvious pannus formation.  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 He was seen by Dr. [name] who agreed  with continue watchul waiting. His last echo was 1/14 EF 55-60% stable gradient across AVR at 23mmHG  Has struggled with AF/palpitations.  Has seen Dr. [name] and had two ablations in 8/13 and 3/14.  Continues to follow with Dr. [name] who is following his Link ILR. Last vitsit in 1/18 showed AF burden < 1% of AAD therapy. Also occasionall symptomatic PACs/PVCs  Last saw Dr. [name] in 4.18 CT chest showed stable aneurysmal dilation of aortic root at 5.0 cm. BP also high. Recommended starting b-blocker.  Echo 12/14/2014 LVEF 55-60%, Aortic valve gradient 44 to 60 mm Hg, increased from 22 to 35 mm Hg. Echo 4/17 EF 60-65% AoV Mean gradient (S): 34 mm Hg. Peak gradient (S): 63 mm Hg.  He returns today for pharmacist-led HF medication titration. At last HF clinic visit on 02/24/17, he was started on carvedilol 6.25 mg BID. He states that he has had less of an appetite since he started carvedilol. He used to eat a chik-fil-a chicken biscuit every day and now he is only  eating once a day and it is usually a green salad. 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 He used to walk about 3 miles a day but since the winter has not really started exercising as regularly anymore. He does not smoke and only rarely drinks ETOH. Still with brief palpitations almost daily. No syncope and presyncope. Use abx when goes to dentist.   . Shortness of breath/dyspnea on exertion? no  . Orthopnea/PND? no . Edema? no . Lightheadedness/dizziness? no . Daily weights at home? no . Blood pressure/heart rate monitoring at home? No - hasn't measured since last visit because he was nervous that his BP would be high but does have BP monitor at home . Following low-sodium/fluid-restricted diet? No - only eating 1 meal a day, usually salads; sometimes drinks sweet tea, no coffee or other caffeinated beverages   HF/HTN Medications: Carvedilol 6.25 mg PO BID Diltiazem 60 mg PO q6h PRN afib Lisinopril 20 mg PO daily Verapamil 120 mg PO QHS  Has the patient been experiencing any side effects to the medications prescribed?  Yes - starting to exhibit symptoms of ED but does not want any medications to treat this  Does the patient have any problems obtaining medications due to transportation or finances?   Yes - no Rx insurance but none of his medications are too costly for him   Understanding of regimen: good Understanding  of indications: good Potential of compliance: good Patient understands to avoid NSAIDs. Patient understands to avoid decongestants.    Pertinent Lab Values: . 02/01/17: Serum creatinine 0.94, BUN 16, Potassium 4.8, Sodium 141  Vital Signs: . Weight: 247 lb (dry weight: 245-248 lb) . Blood pressure: 136/80 mmHg  . Heart rate: 50 bpm    Assessment: 1. HTN  - BP much better controlled  - Will increase lisinopril to 40 mg daily and have him check his BP at around the same time every morning  - Discussed the importance of  compliance with his medications, low sodium diet, caution with caffeinated beverages (i.e. sweet tea) and to incorporate more exercise into his routine (ideally 30 mins 5 days/week) 2. Aortic stenosis s/p AVR  - Due for repeat echo.  Gradients slightly increased on last echo  - Emphasized use abx for SBE prophylaxis  - Continue coumadin and ECASA 81 3. Aortic root aneurysm  - Followed by Dr. [name] as above, Needs better BP control  4. Lipids  - Followed by Dr. Beverely Low. LDL remains high (121). Now simva. Unable to tolerate lipitor or crestor. Consider adding Zetia or PCSK-9 inhibitor 5. Atrial fibrillation  - Followed by Dr. Johney Frame  Plan: 1) Medication changes: Based on clinical presentation, vital signs and recent labs will increase lisinopril to 40 mg daily 2) Labs: BMET at next visit  3) Follow-up: Pharmacy visit on 04/07/17 and Dr. Gala Romney on 04/28/17   Mark [name]. Mark [name], PharmD, BCPS, CPP Clinical Pharmacist Pager: [phone] Phone: [phone] 03/17/2017 2:01 PM  Agree with increasing lisinopril.  Arvilla Meres, MD  10:53 PM

## 2017-03-17 NOTE — Patient Instructions (Signed)
Please stop taking your coumadin for the next two doses.  Then restart as directed. You need to schedule an appointment with coumadin clinic for early next week to recheck INR.  Please start the amoxicillin as directed. Start the tessalon as directed for cough.  Continue staying hydrated and getting plenty of rest.  Follow-up if symptoms are not resolving.

## 2017-03-21 ENCOUNTER — Other Ambulatory Visit: Payer: Self-pay

## 2017-03-21 ENCOUNTER — Emergency Department (HOSPITAL_COMMUNITY): Payer: Self-pay

## 2017-03-21 ENCOUNTER — Encounter (HOSPITAL_COMMUNITY): Payer: Self-pay | Admitting: Emergency Medicine

## 2017-03-21 ENCOUNTER — Emergency Department (HOSPITAL_COMMUNITY)
Admission: EM | Admit: 2017-03-21 | Discharge: 2017-03-21 | Disposition: A | Discharge status: Home / Self Care | Payer: Self-pay | Attending: Emergency Medicine | Admitting: Emergency Medicine

## 2017-03-21 DIAGNOSIS — I2510 Atherosclerotic heart disease of native coronary artery without angina pectoris: Secondary | ICD-10-CM | POA: Insufficient documentation

## 2017-03-21 DIAGNOSIS — J40 Bronchitis, not specified as acute or chronic: Secondary | ICD-10-CM

## 2017-03-21 DIAGNOSIS — Z7901 Long term (current) use of anticoagulants: Secondary | ICD-10-CM | POA: Insufficient documentation

## 2017-03-21 DIAGNOSIS — I480 Paroxysmal atrial fibrillation: Secondary | ICD-10-CM

## 2017-03-21 DIAGNOSIS — Z79899 Other long term (current) drug therapy: Secondary | ICD-10-CM | POA: Insufficient documentation

## 2017-03-21 LAB — CBC WITH DIFFERENTIAL/PLATELET
Basophils Absolute: 0 10*3/uL (ref 0.0–0.1)
Basophils Relative: 1 %
Eosinophils Absolute: 0.1 10*3/uL (ref 0.0–0.7)
Eosinophils Relative: 1 %
HCT: 39.5 % (ref 39.0–52.0)
Hemoglobin: 13.6 g/dL (ref 13.0–17.0)
Lymphocytes Relative: 21 %
Lymphs Abs: 1.5 10*3/uL (ref 0.7–4.0)
MCH: 30.1 pg (ref 26.0–34.0)
MCHC: 34.4 g/dL (ref 30.0–36.0)
MCV: 87.4 fL (ref 78.0–100.0)
Monocytes Absolute: 0.5 10*3/uL (ref 0.1–1.0)
Monocytes Relative: 7 %
Neutro Abs: 5 10*3/uL (ref 1.7–7.7)
Neutrophils Relative %: 70 %
Platelets: 181 10*3/uL (ref 150–400)
RBC: 4.52 MIL/uL (ref 4.22–5.81)
RDW: 12.4 % (ref 11.5–15.5)
WBC: 7.1 10*3/uL (ref 4.0–10.5)

## 2017-03-21 LAB — COMPREHENSIVE METABOLIC PANEL
ALT: 25 U/L (ref 17–63)
AST: 21 U/L (ref 15–41)
Albumin: 3.8 g/dL (ref 3.5–5.0)
Alkaline Phosphatase: 64 U/L (ref 38–126)
Anion gap: 6 (ref 5–15)
BUN: 11 mg/dL (ref 6–20)
CO2: 24 mmol/L (ref 22–32)
Calcium: 9.1 mg/dL (ref 8.9–10.3)
Chloride: 108 mmol/L (ref 101–111)
Creatinine, Ser: 0.91 mg/dL (ref 0.61–1.24)
GFR calc Af Amer: >60 mL/min (ref >60)
GFR calc non Af Amer: >60 mL/min (ref >60)
Glucose, Bld: 115 mg/dL — ABNORMAL HIGH (ref 65–99)
Potassium: 4.4 mmol/L (ref 3.5–5.1)
Sodium: 138 mmol/L (ref 135–145)
Total Bilirubin: 1.4 mg/dL — ABNORMAL HIGH (ref 0.3–1.2)
Total Protein: 6.3 g/dL — ABNORMAL LOW (ref 6.5–8.1)

## 2017-03-21 LAB — I-STAT TROPONIN, ED: Troponin i, poc: 0.02 ng/mL (ref 0.00–0.08)

## 2017-03-21 LAB — BRAIN NATRIURETIC PEPTIDE: B Natriuretic Peptide: 161.1 pg/mL — ABNORMAL HIGH (ref 0.0–100.0)

## 2017-03-21 LAB — PROTIME-INR
INR: 2.41
Prothrombin Time: 26.7 seconds — ABNORMAL HIGH (ref 11.4–15.2)

## 2017-03-21 MED ORDER — HYDROCODONE-HOMATROPINE 5-1.5 MG/5ML PO SYRP: ORAL_SOLUTION | ORAL | 0 refills | Status: DC

## 2017-03-21 MED ORDER — IPRATROPIUM-ALBUTEROL 0.5-2.5 (3) MG/3ML IN SOLN
3.0000 mL | Freq: Once | RESPIRATORY_TRACT | Status: AC
  Administered 2017-03-21: 3 mL via RESPIRATORY_TRACT
  Filled 2017-03-21: qty 3

## 2017-03-21 MED ORDER — ALBUTEROL SULFATE HFA 108 (90 BASE) MCG/ACT IN AERS
2.0000 | INHALATION_SPRAY | RESPIRATORY_TRACT | Status: DC | PRN
  Administered 2017-03-21: 2 via RESPIRATORY_TRACT
  Filled 2017-03-21: qty 6.7
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
## 2017-03-21 NOTE — ED Triage Notes (Signed)
Pt sts increased SOB this am; pt sts hx of recent bronchitis; pt denies pain

## 2017-03-26 ENCOUNTER — Telehealth (HOSPITAL_COMMUNITY): Payer: Self-pay | Admitting: *Deleted

## 2017-03-26 ENCOUNTER — Ambulatory Visit (HOSPITAL_COMMUNITY)
Admission: RE | Admit: 2017-03-26 | Discharge: 2017-03-26 | Disposition: A | Discharge status: Home / Self Care | Payer: Self-pay | Source: Ambulatory Visit | Attending: Nurse Practitioner | Admitting: Nurse Practitioner

## 2017-03-26 ENCOUNTER — Other Ambulatory Visit (HOSPITAL_COMMUNITY): Payer: Self-pay | Admitting: *Deleted

## 2017-03-26 ENCOUNTER — Encounter (HOSPITAL_COMMUNITY): Payer: Self-pay | Admitting: Nurse Practitioner

## 2017-03-26 VITALS — BP 112/78 | HR 56 | Ht 71.0 in | Wt 242.4 lb

## 2017-03-26 DIAGNOSIS — Z9119 Patient's noncompliance with other medical treatment and regimen: Secondary | ICD-10-CM | POA: Insufficient documentation

## 2017-03-26 DIAGNOSIS — G4733 Obstructive sleep apnea (adult) (pediatric): Secondary | ICD-10-CM | POA: Insufficient documentation

## 2017-03-26 DIAGNOSIS — Z952 Presence of prosthetic heart valve: Secondary | ICD-10-CM | POA: Insufficient documentation

## 2017-03-26 DIAGNOSIS — Z885 Allergy status to narcotic agent status: Secondary | ICD-10-CM | POA: Insufficient documentation

## 2017-03-26 DIAGNOSIS — I10 Essential (primary) hypertension: Secondary | ICD-10-CM | POA: Insufficient documentation

## 2017-03-26 DIAGNOSIS — I2510 Atherosclerotic heart disease of native coronary artery without angina pectoris: Secondary | ICD-10-CM | POA: Insufficient documentation

## 2017-03-26 DIAGNOSIS — Z79899 Other long term (current) drug therapy: Secondary | ICD-10-CM | POA: Insufficient documentation

## 2017-03-26 DIAGNOSIS — I712 Thoracic aortic aneurysm, without rupture: Secondary | ICD-10-CM | POA: Insufficient documentation

## 2017-03-26 DIAGNOSIS — Z7901 Long term (current) use of anticoagulants: Secondary | ICD-10-CM | POA: Insufficient documentation

## 2017-03-26 DIAGNOSIS — I480 Paroxysmal atrial fibrillation: Secondary | ICD-10-CM | POA: Insufficient documentation

## 2017-03-26 DIAGNOSIS — Z9889 Other specified postprocedural states: Secondary | ICD-10-CM | POA: Insufficient documentation

## 2017-03-26 DIAGNOSIS — Z7982 Long term (current) use of aspirin: Secondary | ICD-10-CM | POA: Insufficient documentation

## 2017-03-26 NOTE — Telephone Encounter (Signed)
Pt notified - LINQ removal and insertion of LINQ scheduled for August 7th. Pt to arrive in short stay at 630am for 8am procedure. Pt verbalized understanding. Appt requested for wound check.
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
## 2017-04-06 ENCOUNTER — Encounter (HOSPITAL_COMMUNITY): Payer: Self-pay | Admitting: Internal Medicine

## 2017-04-06 ENCOUNTER — Ambulatory Visit (HOSPITAL_COMMUNITY)
Admission: RE | Admit: 2017-04-06 | Discharge: 2017-04-06 | Disposition: A | Discharge status: Home / Self Care | Payer: Self-pay | Source: Ambulatory Visit | Attending: Internal Medicine | Admitting: Internal Medicine

## 2017-04-06 ENCOUNTER — Encounter (HOSPITAL_COMMUNITY)
Admission: RE | Disposition: A | Discharge status: Home / Self Care | Payer: Self-pay | Source: Ambulatory Visit | Attending: Internal Medicine

## 2017-04-06 DIAGNOSIS — Z4509 Encounter for adjustment and management of other cardiac device: Secondary | ICD-10-CM | POA: Insufficient documentation

## 2017-04-06 DIAGNOSIS — G4733 Obstructive sleep apnea (adult) (pediatric): Secondary | ICD-10-CM | POA: Insufficient documentation

## 2017-04-06 DIAGNOSIS — I4891 Unspecified atrial fibrillation: Secondary | ICD-10-CM

## 2017-04-06 DIAGNOSIS — I480 Paroxysmal atrial fibrillation: Secondary | ICD-10-CM | POA: Insufficient documentation

## 2017-04-06 DIAGNOSIS — R002 Palpitations: Secondary | ICD-10-CM | POA: Insufficient documentation

## 2017-04-06 DIAGNOSIS — Z7901 Long term (current) use of anticoagulants: Secondary | ICD-10-CM | POA: Insufficient documentation

## 2017-04-06 DIAGNOSIS — I2510 Atherosclerotic heart disease of native coronary artery without angina pectoris: Secondary | ICD-10-CM | POA: Insufficient documentation

## 2017-04-06 DIAGNOSIS — I10 Essential (primary) hypertension: Secondary | ICD-10-CM | POA: Insufficient documentation

## 2017-04-06 DIAGNOSIS — I712 Thoracic aortic aneurysm, without rupture: Secondary | ICD-10-CM | POA: Insufficient documentation

## 2017-04-06 DIAGNOSIS — Z952 Presence of prosthetic heart valve: Secondary | ICD-10-CM | POA: Insufficient documentation

## 2017-04-06 DIAGNOSIS — Z7982 Long term (current) use of aspirin: Secondary | ICD-10-CM | POA: Insufficient documentation

## 2017-04-06 DIAGNOSIS — Z885 Allergy status to narcotic agent status: Secondary | ICD-10-CM | POA: Insufficient documentation

## 2017-04-06 HISTORY — PX: LOOP RECORDER REMOVAL: EP1215

## 2017-04-06 HISTORY — PX: LOOP RECORDER INSERTION: EP1214

## 2017-04-06 SURGERY — LOOP RECORDER REMOVAL
Anesthesia: LOCAL

## 2017-04-06 MED ORDER — LIDOCAINE-EPINEPHRINE 1 %-1:100000 IJ SOLN
INTRAMUSCULAR | Status: AC
  Filled 2017-04-06: qty 1

## 2017-04-06 MED ORDER — LIDOCAINE-EPINEPHRINE 1 %-1:100000 IJ SOLN
INTRAMUSCULAR | Status: DC | PRN
  Administered 2017-04-06: 10 mL

## 2017-04-06 SURGICAL SUPPLY — 2 items
LOOP REVEAL LINQSYS (Prosthesis & Implant Heart) ×1 IMPLANT
PACK LOOP INSERTION (CUSTOM PROCEDURE TRAY) ×2 IMPLANT
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
## 2017-04-06 NOTE — Discharge Instructions (Signed)
Loop recorder implant instructions given verbally and in writing. Pt verbalizes understanding. Questions answered

## 2017-04-06 NOTE — Interval H&P Note (Signed)
History and Physical Interval Note:  04/06/2017 7:40 AM  Mark [name]  has presented today for surgery, with the diagnosis of Afib  The various methods of treatment have been discussed with the patient and family. After consideration of risks, benefits and other options for treatment, the patient has consented to  Procedure(s): Loop Recorder Removal (N/A) Loop Recorder Insertion (N/A) as a surgical intervention .  The patient's history has been reviewed, patient examined, no change in status, stable for surgery.  I have reviewed the patient's chart and labs.  Questions were answered to the patient's satisfaction.     [name]

## 2017-04-07 ENCOUNTER — Ambulatory Visit (INDEPENDENT_AMBULATORY_CARE_PROVIDER_SITE_OTHER): Payer: Self-pay | Admitting: *Deleted

## 2017-04-07 ENCOUNTER — Ambulatory Visit (HOSPITAL_COMMUNITY): Payer: Self-pay

## 2017-04-07 DIAGNOSIS — Z7901 Long term (current) use of anticoagulants: Secondary | ICD-10-CM

## 2017-04-07 DIAGNOSIS — I4891 Unspecified atrial fibrillation: Secondary | ICD-10-CM

## 2017-04-07 DIAGNOSIS — Z952 Presence of prosthetic heart valve: Secondary | ICD-10-CM

## 2017-04-07 LAB — POCT INR: INR: 5.9

## 2017-04-11 ENCOUNTER — Other Ambulatory Visit (HOSPITAL_COMMUNITY): Payer: Self-pay | Admitting: Internal Medicine

## 2017-04-19 ENCOUNTER — Ambulatory Visit (INDEPENDENT_AMBULATORY_CARE_PROVIDER_SITE_OTHER): Payer: Self-pay | Admitting: *Deleted

## 2017-04-19 ENCOUNTER — Ambulatory Visit (INDEPENDENT_AMBULATORY_CARE_PROVIDER_SITE_OTHER): Payer: Self-pay | Admitting: Pharmacist

## 2017-04-19 DIAGNOSIS — Z7901 Long term (current) use of anticoagulants: Secondary | ICD-10-CM

## 2017-04-19 DIAGNOSIS — I4891 Unspecified atrial fibrillation: Secondary | ICD-10-CM

## 2017-04-19 DIAGNOSIS — Z952 Presence of prosthetic heart valve: Secondary | ICD-10-CM

## 2017-04-19 LAB — CUP PACEART INCLINIC DEVICE CHECK
Date Time Interrogation Session: 20180820141733
Implantable Pulse Generator Implant Date: 20180807

## 2017-04-19 LAB — PROTIME-INR
INR: 6.1 (ref 0.8–1.2)
Prothrombin Time: 64.8 s — ABNORMAL HIGH (ref 9.1–12.0)

## 2017-04-19 LAB — POCT INR: INR: 7

## 2017-04-20 NOTE — Progress Notes (Signed)
Wound check appointment. Steri-strips removed. Wound without redness or edema. Incision edges approximated, wound well healed. Battery status: GOOD. R-waves 0.54 mV. 0 symptom episodes, 0 tachy episodes, 1 pause episodes, duration 3 seconds, patient states no symptoms. No brady episodes. 3 AF episodes (6.1% burden)+Warfarin, longest 9 hours 48 minutes, Max V rate 333 bpm. Monthly summary reports and ROV with JA 07/12/17.

## 2017-04-28 ENCOUNTER — Encounter (HOSPITAL_COMMUNITY): Payer: Self-pay | Admitting: Internal Medicine

## 2017-04-28 ENCOUNTER — Ambulatory Visit (HOSPITAL_COMMUNITY)
Admission: RE | Admit: 2017-04-28 | Discharge: 2017-04-28 | Disposition: A | Discharge status: Home / Self Care | Payer: Self-pay | Source: Ambulatory Visit | Attending: Internal Medicine | Admitting: Internal Medicine

## 2017-04-28 VITALS — BP 110/74 | HR 90 | Wt 239.0 lb

## 2017-04-28 DIAGNOSIS — Z79899 Other long term (current) drug therapy: Secondary | ICD-10-CM | POA: Insufficient documentation

## 2017-04-28 DIAGNOSIS — I350 Nonrheumatic aortic (valve) stenosis: Secondary | ICD-10-CM | POA: Insufficient documentation

## 2017-04-28 DIAGNOSIS — I480 Paroxysmal atrial fibrillation: Secondary | ICD-10-CM | POA: Insufficient documentation

## 2017-04-28 DIAGNOSIS — I4891 Unspecified atrial fibrillation: Secondary | ICD-10-CM

## 2017-04-28 DIAGNOSIS — Z7901 Long term (current) use of anticoagulants: Secondary | ICD-10-CM | POA: Insufficient documentation

## 2017-04-28 DIAGNOSIS — G4733 Obstructive sleep apnea (adult) (pediatric): Secondary | ICD-10-CM | POA: Insufficient documentation

## 2017-04-28 DIAGNOSIS — I2510 Atherosclerotic heart disease of native coronary artery without angina pectoris: Secondary | ICD-10-CM | POA: Insufficient documentation

## 2017-04-28 DIAGNOSIS — I10 Essential (primary) hypertension: Secondary | ICD-10-CM | POA: Insufficient documentation

## 2017-04-28 DIAGNOSIS — Z7982 Long term (current) use of aspirin: Secondary | ICD-10-CM | POA: Insufficient documentation

## 2017-04-28 DIAGNOSIS — Z952 Presence of prosthetic heart valve: Secondary | ICD-10-CM | POA: Insufficient documentation

## 2017-04-28 DIAGNOSIS — I712 Thoracic aortic aneurysm, without rupture: Secondary | ICD-10-CM | POA: Insufficient documentation

## 2017-04-28 NOTE — Addendum Note (Signed)
Encounter addended by: Teresa Coombs, RN on: 04/28/2017 10:58 AM<BR>    Actions taken: Sign clinical note

## 2017-04-28 NOTE — Progress Notes (Signed)
Patient ID: Mark [name], male   DOB: Oct 13, 1960, 56 y.o.   MRN: [id number]    Advanced Heart Failure Clinic Note   PCP: [name] HF: Dr. Gala RomneyBensimhon   HPI:  [name] is a very pleasant 56 year old male with a history of bicuspid aortic valve complicated by endocarditis.  He is status post St. Jude aortic valve replacement in 1983.  He also has a history of hyperlipidemia and PAF s/p DC-CV in 9/11. OSA noncompliant with CPAP.   Over the past few years year, he has had some progression in the gradients across his valve.  He underwent cardiac catheterization in May 2009 which showed normal coronary arteries.  We did not cross the valve, obviously this is a mechanical valve.  However, the valve leaflets were seen to be opening well on fluoroscopy.  He did undergo a TEE.  While there was some turbulence around the valve, the leaflets seem to be moving well.  There was no obvious pannus formation.  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 He was seen by Dr. [name] who agreed  with continue watchul waiting. His last echo was 1/14 EF 55-60% stable gradient across AVR at 23mmHG  Has struggled with AF/palpitations.  Has seen Dr. [name] and had two ablations in 8/13 and 3/14.  Continues to follow with Dr. [name] who is following his Link ILR. Last vitsit in 1/18 showed AF burden < 1% of AAD therapy. Also occasionall symptomatic PACs/PVCs  Last saw Dr. [name] in 4.18 CT chest showed stable aneurysmal dilation of aortic root at 5.0 cm. BP also high. Recommended starting b-blocker.  Echo 12/14/2014 LVEF 55-60%, Aortic valve gradient 44 to 60 mm Hg, increased from 22 to 35 mm Hg. Echo 4/17 EF 60-65%  AoV Mean gradient (S): 34 mm Hg. Peak gradient (S): 63 mm Hg.  He returns for follow up. Overall feels ok. Remains fairly active. Now has Linq monitor in )place 04/06/17). Has been having episodes of AF lasting hours. Has f/u  with Dr. [name] in September. [facility] for titration of anti-HTN meds. SBP much improved 130 range. No edema, orthopnea, PND. Occasional transient CP. No syncope. Frequent palpitations. Weight down 11 pounds.   ROS: All systems negative except as listed in HPI, PMH and Problem List.  Past Medical History:  Diagnosis Date  . Aortic stenosis    a. due to bicuspid AoV; 1983 S/p mechanical AVR (St. Jude) - chronic coumadin with INR run between 3.5-4.5;  b. 03/2012 Echo: EF 60%, nl wall motion, mod dil LA.  Marland Kitchen. Ascending aortic aneurysm (HCC)   . Atrial tachycardia (HCC)    a. admitted 07/2012  . Coronary artery disease   . Gallstones   . H/O cardiac catheterization    a. 12/2007 Cath: nl cors.  . Headache(784.0)   . 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 Hypertension   . OSA (obstructive sleep apnea)    a. noncompliant with CPAP  . Paroxysmal atrial fibrillation (HCC)    s/p afib ablation x 2    Current Outpatient Prescriptions  Medication Sig Dispense Refill  . aspirin EC 81 MG tablet Take 81 mg by mouth at bedtime.     . carvedilol (COREG) 6.25 MG tablet Take 1 tablet (6.25 mg total) by mouth 2 (two) times daily. 60 tablet 3  . diltiazem (CARDIZEM) 60 MG tablet  Take one tablet by mouth every 6 hours as needed for afib (Patient taking differently: Take 60 mg by mouth every 6 (six) hours as needed (for afib). ) 30 tablet 1  . esomeprazole (NEXIUM) 40 MG capsule Take 40 mg by mouth at bedtime.    Marland Kitchen HYDROcodone-homatropine (HYCODAN) 5-1.5 MG/5ML syrup 5-10 mLs every 6 hours as needed for cough 120 mL 0  . ibuprofen (ADVIL,MOTRIN) 200 MG tablet Take 600 mg by mouth every 8 (eight) hours as needed (for pain.).    Marland Kitchen lisinopril (PRINIVIL,ZESTRIL) 40 MG tablet Take 1 tablet (40 mg total) by mouth daily. (Patient taking differently: Take 40 mg by mouth at bedtime. ) 30 tablet 5  . meclizine (ANTIVERT) 25 MG tablet Take 1 tablet (25 mg total) by mouth 3 (three) times  daily as needed for dizziness. (Patient taking differently: Take 25 mg by mouth 3 (three) times daily as needed (for vertigo/dizziness.). ) 30 tablet 0  . ondansetron (ZOFRAN ODT) 4 MG disintegrating tablet Take 1 tablet (4 mg total) by mouth every 8 (eight) hours as needed for nausea or vomiting. (Patient taking differently: Take 4 mg by mouth every 8 (eight) hours as needed (for nausea/vomiting associated with vertigo). ) 20 tablet 0  . simvastatin (ZOCOR) 20 MG tablet TAKE 1 TABLET (20 MG TOTAL) BY MOUTH AT BEDTIME. 90 tablet 0  . verapamil (CALAN-SR) 120 MG CR tablet TAKE 1 TABLET BY MOUTH AT BEDTIME 30 tablet 6  . warfarin (COUMADIN) 10 MG tablet TAKE AS DIRECTED BY COUMADIN CLINIC (Patient taking differently: TAKE 1 TABLET (10 MG) BY MOUTH DAILY AT BEDTIME) 40 tablet 2   No current facility-administered medications for this encounter.     PHYSICAL EXAM: Vitals:   04/28/17 0934  BP: 110/74  Pulse: 90  SpO2: 98%  Weight: 239 lb (108.4 kg)   Wt Readings from Last 3 Encounters:  04/28/17 239 lb (108.4 kg)  04/06/17 242 lb (109.8 kg)  03/26/17 242 lb 6.4 oz (110 kg)     General:  Well appearing. No resp difficulty HEENT: normal Neck: supple. no JVD. Carotids 2+ bilat; + bruits. No lymphadenopathy or thryomegaly appreciated. Cor: PMI nondisplaced. Regular rate & rhythm. No rubs, gallops  2/6 AS murmur. Mechanical s2 Lungs: clear Abdomen: soft, nontender, nondistended. No hepatosplenomegaly. No bruits or masses. Good bowel sounds. Extremities: no cyanosis, clubbing, rash, edema Neuro: alert & orientedx3, cranial nerves grossly intact. moves all 4 extremities w/o difficulty. Affect pleasant   ASSESSMENT & PLAN: 1. Aortic stenosis s/p AVR - Echo 03/02/17 EF normal Mean AoV gradient 26.  Valve sounds good on exam. - Emphasized use abx for SBE prophylaxis - Continue coumadin and ECASA 81 2. HTN - Blood pressure wth much improved controlled. Continue current regimen. 3. Aortic root  aneurysm - Followed by Dr. [name]. BP better controlled  4. Lipids - Followed by Dr. Beverely Low. LDL remains high (121). Now simva. Unable to tolerate lipirtor or crestor. Consider adding Zetia or PCSK-9 inhibitor 5. Atrial fibrillation - Followed by Dr. Johney Frame. Will see him again next month.    Tinisha Etzkorn,MD 9:52 AM

## 2017-04-28 NOTE — Patient Instructions (Signed)
Your physician recommends that you schedule a follow-up appointment in: 6 months we will give you a call to schedule your appointment

## 2017-05-03 ENCOUNTER — Other Ambulatory Visit: Payer: Self-pay | Admitting: Internal Medicine

## 2017-05-04 ENCOUNTER — Telehealth: Payer: Self-pay | Admitting: *Deleted

## 2017-05-04 NOTE — Telephone Encounter (Signed)
Spoke with patient regarding sending manual transmission. Patient states he will send manual transmission tonight. Advised I will review episodes once received.  Inquired about symptoms. Patient states, "felt my heart out of rhythm early morning on August 29", but no syncopal or presyncopal episodes. Advised patient ECG appears AF episode August 29th, 2019 at 0241, duration 8 hours 4 minutes.

## 2017-05-05 NOTE — Telephone Encounter (Signed)
Manual transmission received. 1 AF episode, 8 hours 4 minutes, Avg V rate 59 bpm. 3 pause episodes, all nocturnal, longest 3 seconds. Will place in Dr. Lona KettleAllred LINQ folder for review.

## 2017-05-10 ENCOUNTER — Ambulatory Visit (INDEPENDENT_AMBULATORY_CARE_PROVIDER_SITE_OTHER): Payer: Self-pay | Admitting: *Deleted

## 2017-05-10 ENCOUNTER — Telehealth: Payer: Self-pay | Admitting: *Deleted

## 2017-05-10 DIAGNOSIS — Z7901 Long term (current) use of anticoagulants: Secondary | ICD-10-CM

## 2017-05-10 DIAGNOSIS — Z952 Presence of prosthetic heart valve: Secondary | ICD-10-CM

## 2017-05-10 DIAGNOSIS — I4891 Unspecified atrial fibrillation: Secondary | ICD-10-CM

## 2017-05-10 LAB — POCT INR: INR: 3.7

## 2017-05-10 NOTE — Telephone Encounter (Signed)
Spoke with patient, requested manual [employer] transmission for review of pause episodes that did not transmit automatically.  Patient agrees to send later this afternoon.  He reports an AF episode yesterday, 9/9, that made him feel ShOB.  Patient reports he took PRN diltiazem twice during the day yesterday and did not note any improvement in symptoms.  He denies presyncope or syncope.  Advised patient I will review transmission with Dr. [name] once it is received and call him back with any additional recommendations.  Patient is appreciative and agreeable to this plan.

## 2017-05-11 NOTE — Telephone Encounter (Signed)
Manual transmission received.  11 pause episodes noted during AF episode on 9/9 that lasted for 14.5hrs.  Pause episodes were all 3sec duration, possibly nocturnal (all between [phone]).  As noted previously, patient denies symptoms of presyncope or syncope during  episode.  Reviewed verbally with Dr. [name], who recommended follow-up in the AF Clinic.  Advised patient of recommendations and he is agreeable to this plan.  Patient prefers appointment next week due to weather.  He accepted an appointment on 05/17/17 at 8:30am.  Patient is aware of office address and parking code.  He is appreciative of assistance and agrees to call or seek emergency medical attention with new or worsening symptoms in the interim.

## 2017-05-14 ENCOUNTER — Other Ambulatory Visit: Payer: Self-pay | Admitting: Family Medicine

## 2017-05-17 ENCOUNTER — Ambulatory Visit (HOSPITAL_COMMUNITY)
Admission: RE | Admit: 2017-05-17 | Discharge: 2017-05-17 | Disposition: A | Discharge status: Home / Self Care | Payer: Self-pay | Source: Ambulatory Visit | Attending: Nurse Practitioner | Admitting: Nurse Practitioner

## 2017-05-17 ENCOUNTER — Encounter (HOSPITAL_COMMUNITY): Payer: Self-pay | Admitting: Nurse Practitioner

## 2017-05-17 VITALS — BP 130/76 | HR 52 | Ht 70.0 in | Wt 250.6 lb

## 2017-05-17 DIAGNOSIS — Z7982 Long term (current) use of aspirin: Secondary | ICD-10-CM | POA: Insufficient documentation

## 2017-05-17 DIAGNOSIS — Z952 Presence of prosthetic heart valve: Secondary | ICD-10-CM | POA: Insufficient documentation

## 2017-05-17 DIAGNOSIS — I480 Paroxysmal atrial fibrillation: Secondary | ICD-10-CM

## 2017-05-17 DIAGNOSIS — R001 Bradycardia, unspecified: Secondary | ICD-10-CM | POA: Insufficient documentation

## 2017-05-17 DIAGNOSIS — I350 Nonrheumatic aortic (valve) stenosis: Secondary | ICD-10-CM | POA: Insufficient documentation

## 2017-05-17 DIAGNOSIS — I38 Endocarditis, valve unspecified: Secondary | ICD-10-CM | POA: Insufficient documentation

## 2017-05-17 DIAGNOSIS — Z885 Allergy status to narcotic agent status: Secondary | ICD-10-CM | POA: Insufficient documentation

## 2017-05-17 DIAGNOSIS — I10 Essential (primary) hypertension: Secondary | ICD-10-CM | POA: Insufficient documentation

## 2017-05-17 DIAGNOSIS — I712 Thoracic aortic aneurysm, without rupture: Secondary | ICD-10-CM | POA: Insufficient documentation

## 2017-05-17 DIAGNOSIS — I491 Atrial premature depolarization: Secondary | ICD-10-CM | POA: Insufficient documentation

## 2017-05-17 DIAGNOSIS — Z801 Family history of malignant neoplasm of trachea, bronchus and lung: Secondary | ICD-10-CM | POA: Insufficient documentation

## 2017-05-17 DIAGNOSIS — I2510 Atherosclerotic heart disease of native coronary artery without angina pectoris: Secondary | ICD-10-CM | POA: Insufficient documentation

## 2017-05-17 DIAGNOSIS — Z7901 Long term (current) use of anticoagulants: Secondary | ICD-10-CM | POA: Insufficient documentation

## 2017-05-17 DIAGNOSIS — G4733 Obstructive sleep apnea (adult) (pediatric): Secondary | ICD-10-CM | POA: Insufficient documentation
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
## 2017-06-02 ENCOUNTER — Encounter: Payer: Self-pay | Admitting: Internal Medicine

## 2017-06-02 ENCOUNTER — Ambulatory Visit (INDEPENDENT_AMBULATORY_CARE_PROVIDER_SITE_OTHER): Payer: Self-pay

## 2017-06-02 ENCOUNTER — Ambulatory Visit (INDEPENDENT_AMBULATORY_CARE_PROVIDER_SITE_OTHER): Payer: Self-pay | Admitting: Internal Medicine

## 2017-06-02 VITALS — BP 100/68 | HR 48 | Ht 70.0 in | Wt 242.4 lb

## 2017-06-02 DIAGNOSIS — Z952 Presence of prosthetic heart valve: Secondary | ICD-10-CM

## 2017-06-02 DIAGNOSIS — I480 Paroxysmal atrial fibrillation: Secondary | ICD-10-CM

## 2017-06-02 DIAGNOSIS — Z7901 Long term (current) use of anticoagulants: Secondary | ICD-10-CM

## 2017-06-02 DIAGNOSIS — I4891 Unspecified atrial fibrillation: Secondary | ICD-10-CM

## 2017-06-02 LAB — CUP PACEART INCLINIC DEVICE CHECK
Date Time Interrogation Session: 20181003101546
Implantable Pulse Generator Implant Date: 20180807

## 2017-06-02 LAB — PROTIME-INR
INR: 5 — ABNORMAL HIGH (ref 0.8–1.2)
Prothrombin Time: 52.6 s — ABNORMAL HIGH (ref 9.1–12.0)

## 2017-06-02 LAB — POCT INR: INR: 6.2

## 2017-06-02 NOTE — Patient Instructions (Signed)
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
## 2017-06-05 ENCOUNTER — Other Ambulatory Visit: Payer: Self-pay | Admitting: Internal Medicine

## 2017-06-07 ENCOUNTER — Emergency Department (HOSPITAL_COMMUNITY)
Admission: EM | Admit: 2017-06-07 | Discharge: 2017-06-08 | Disposition: A | Discharge status: Home / Self Care | Payer: Self-pay | Attending: Emergency Medicine | Admitting: Emergency Medicine

## 2017-06-07 ENCOUNTER — Encounter (HOSPITAL_COMMUNITY): Payer: Self-pay

## 2017-06-07 ENCOUNTER — Ambulatory Visit (INDEPENDENT_AMBULATORY_CARE_PROVIDER_SITE_OTHER): Payer: Self-pay | Admitting: *Deleted

## 2017-06-07 DIAGNOSIS — I480 Paroxysmal atrial fibrillation: Secondary | ICD-10-CM | POA: Insufficient documentation

## 2017-06-07 DIAGNOSIS — Z7982 Long term (current) use of aspirin: Secondary | ICD-10-CM | POA: Insufficient documentation

## 2017-06-07 DIAGNOSIS — I2510 Atherosclerotic heart disease of native coronary artery without angina pectoris: Secondary | ICD-10-CM | POA: Insufficient documentation

## 2017-06-07 DIAGNOSIS — I10 Essential (primary) hypertension: Secondary | ICD-10-CM | POA: Insufficient documentation

## 2017-06-07 DIAGNOSIS — I4891 Unspecified atrial fibrillation: Secondary | ICD-10-CM

## 2017-06-07 DIAGNOSIS — Z7901 Long term (current) use of anticoagulants: Secondary | ICD-10-CM | POA: Insufficient documentation

## 2017-06-07 DIAGNOSIS — Z79899 Other long term (current) drug therapy: Secondary | ICD-10-CM | POA: Insufficient documentation

## 2017-06-07 DIAGNOSIS — Z9049 Acquired absence of other specified parts of digestive tract: Secondary | ICD-10-CM | POA: Insufficient documentation

## 2017-06-07 LAB — CBC
HCT: 40.8 % (ref 39.0–52.0)
Hemoglobin: 14.1 g/dL (ref 13.0–17.0)
MCH: 30.9 pg (ref 26.0–34.0)
MCHC: 34.6 g/dL (ref 30.0–36.0)
MCV: 89.5 fL (ref 78.0–100.0)
Platelets: 154 10*3/uL (ref 150–400)
RBC: 4.56 MIL/uL (ref 4.22–5.81)
RDW: 12.8 % (ref 11.5–15.5)
WBC: 9.3 10*3/uL (ref 4.0–10.5)

## 2017-06-07 NOTE — ED Triage Notes (Signed)
Pt states that around 10pm he went into afib, hx of the same, CP 8/10 with some SOB. Denies n/v

## 2017-06-08 ENCOUNTER — Emergency Department (HOSPITAL_COMMUNITY): Payer: Self-pay

## 2017-06-08 LAB — BASIC METABOLIC PANEL
Anion gap: 9 (ref 5–15)
BUN: 17 mg/dL (ref 6–20)
CO2: 23 mmol/L (ref 22–32)
Calcium: 9.3 mg/dL (ref 8.9–10.3)
Chloride: 106 mmol/L (ref 101–111)
Creatinine, Ser: 1.09 mg/dL (ref 0.61–1.24)
GFR calc Af Amer: >60 mL/min (ref >60)
GFR calc non Af Amer: >60 mL/min (ref >60)
Glucose, Bld: 148 mg/dL — ABNORMAL HIGH (ref 65–99)
Potassium: 4 mmol/L (ref 3.5–5.1)
Sodium: 138 mmol/L (ref 135–145)

## 2017-06-08 LAB — PROTIME-INR
INR: 2.92
Prothrombin Time: 30.3 seconds — ABNORMAL HIGH (ref 11.4–15.2)

## 2017-06-08 MED ORDER — DILTIAZEM HCL 60 MG PO TABS
60.0000 mg | ORAL_TABLET | Freq: Once | ORAL | Status: AC
  Administered 2017-06-08: 60 mg via ORAL
  Filled 2017-06-08: qty 1

## 2017-06-08 NOTE — Progress Notes (Signed)
Carelink Summary Report / Loop Recorder 
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
## 2017-06-08 NOTE — ED Notes (Signed)
PT states understanding of care given, follow up care. PT ambulated from ED to car with a steady gait.  

## 2017-06-10 ENCOUNTER — Telehealth (HOSPITAL_COMMUNITY): Payer: Self-pay | Admitting: *Deleted

## 2017-06-10 LAB — CUP PACEART REMOTE DEVICE CHECK
Date Time Interrogation Session: 20181006124221
Implantable Pulse Generator Implant Date: 20180807

## 2017-06-10 NOTE — Telephone Encounter (Signed)
Patient called in stating he is having a lot of breakthrough afib its pretty short lived he states but very frequent. Pt has cardizem PRN he can take -- encouraged pt he can try  of cardizem for elevated HR and see if this will help smooth out afib. States Dr. Johney Frame spoke to him about ablation and he feels he is to the point hes ready and request I send a message to his office as INRs will need to coordinated in preparation. Will notify his office - pt will call back if cardizem PRN is not helping.

## 2017-06-14 ENCOUNTER — Encounter (HOSPITAL_COMMUNITY): Payer: Self-pay | Admitting: Nurse Practitioner

## 2017-06-14 ENCOUNTER — Other Ambulatory Visit: Payer: Self-pay | Admitting: Family Medicine

## 2017-06-14 ENCOUNTER — Ambulatory Visit (INDEPENDENT_AMBULATORY_CARE_PROVIDER_SITE_OTHER): Payer: Self-pay | Admitting: Pharmacist Clinician (PhC)/ Clinical Pharmacy Specialist

## 2017-06-14 ENCOUNTER — Ambulatory Visit (HOSPITAL_COMMUNITY)
Admission: RE | Admit: 2017-06-14 | Discharge: 2017-06-14 | Disposition: A | Discharge status: Home / Self Care | Payer: Self-pay | Source: Ambulatory Visit | Attending: Nurse Practitioner | Admitting: Nurse Practitioner

## 2017-06-14 VITALS — BP 130/82 | HR 57 | Ht 71.0 in | Wt 244.6 lb

## 2017-06-14 DIAGNOSIS — Z7982 Long term (current) use of aspirin: Secondary | ICD-10-CM | POA: Insufficient documentation

## 2017-06-14 DIAGNOSIS — Z952 Presence of prosthetic heart valve: Secondary | ICD-10-CM | POA: Insufficient documentation

## 2017-06-14 DIAGNOSIS — Z9049 Acquired absence of other specified parts of digestive tract: Secondary | ICD-10-CM | POA: Insufficient documentation

## 2017-06-14 DIAGNOSIS — I712 Thoracic aortic aneurysm, without rupture: Secondary | ICD-10-CM | POA: Insufficient documentation

## 2017-06-14 DIAGNOSIS — Z8249 Family history of ischemic heart disease and other diseases of the circulatory system: Secondary | ICD-10-CM | POA: Insufficient documentation

## 2017-06-14 DIAGNOSIS — Z7901 Long term (current) use of anticoagulants: Secondary | ICD-10-CM | POA: Insufficient documentation

## 2017-06-14 DIAGNOSIS — I350 Nonrheumatic aortic (valve) stenosis: Secondary | ICD-10-CM | POA: Insufficient documentation

## 2017-06-14 DIAGNOSIS — I10 Essential (primary) hypertension: Secondary | ICD-10-CM | POA: Insufficient documentation

## 2017-06-14 DIAGNOSIS — Z801 Family history of malignant neoplasm of trachea, bronchus and lung: Secondary | ICD-10-CM | POA: Insufficient documentation

## 2017-06-14 DIAGNOSIS — Z885 Allergy status to narcotic agent status: Secondary | ICD-10-CM | POA: Insufficient documentation

## 2017-06-14 DIAGNOSIS — I2510 Atherosclerotic heart disease of native coronary artery without angina pectoris: Secondary | ICD-10-CM | POA: Insufficient documentation

## 2017-06-14 DIAGNOSIS — Z9889 Other specified postprocedural states: Secondary | ICD-10-CM | POA: Insufficient documentation

## 2017-06-14 DIAGNOSIS — I480 Paroxysmal atrial fibrillation: Secondary | ICD-10-CM

## 2017-06-14 DIAGNOSIS — Z79899 Other long term (current) drug therapy: Secondary | ICD-10-CM | POA: Insufficient documentation

## 2017-06-14 DIAGNOSIS — G4733 Obstructive sleep apnea (adult) (pediatric): Secondary | ICD-10-CM | POA: Insufficient documentation

## 2017-06-14 DIAGNOSIS — I4891 Unspecified atrial fibrillation: Secondary | ICD-10-CM

## 2017-06-14 LAB — POCT INR: INR: 3
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
## 2017-06-17 ENCOUNTER — Telehealth: Payer: Self-pay | Admitting: *Deleted

## 2017-06-17 DIAGNOSIS — I480 Paroxysmal atrial fibrillation: Secondary | ICD-10-CM

## 2017-06-17 NOTE — Telephone Encounter (Signed)
Called patient and left message to call back to schedule ablation

## 2017-06-17 NOTE — Telephone Encounter (Signed)
Follow up ° ° ° °Pt is returning call to Kelly.  °

## 2017-06-17 NOTE — Telephone Encounter (Signed)
[name] at 6:30am on 07/07/17 Will need weekly INR's Labs 06/21/17 at 7:30 Do not eat or drink after midnight the night prior to the procedure Do not take any medications the morning of the test Plan for one night stay Will need someone to drive you home at discharge   Will need cardiac CT week of 10/29: Please arrive at [name] at ______ on ______AM (30-45 minutes prior to test start time)  [facility] [address] [place], [place] [address] [phone]  Proceed to the [facility] Radiology Department (First Floor).  Please follow these instructions carefully (unless otherwise directed):  Hold all erectile dysfunction medications at least 48 hours prior to test.  On the Night Before the Test: . Drink plenty of water. . Do not consume any caffeinated/decaffeinated beverages or chocolate 12 hours prior to your test. . Do not take any antihistamines 12 hours prior to your test.   On the Day of the Test: . Drink plenty of water. Do not drink any water within one hour of the test. . Do not eat any food 4 hours prior to the test. . You may take your regular medications prior to the test. . HOLD Furosemide morning of the test.  After the Test: . Drink plenty of water. . After receiving IV contrast, you may experience a mild flushed feeling. This is normal. . On occasion, you may experience a mild rash up to 24 hours after the test. This is not dangerous. If this occurs, you can take Benadryl 25 mg and increase your fluid intake. . If you experience trouble breathing, this can be serious. If it is severe call 911 IMMEDIATELY. If it is mild, please call our office. . If you take any of these medications: Glipizide/Metformin, Avandament, Glucavance, please do not take 48 hours after completing test.

## 2017-06-21 ENCOUNTER — Other Ambulatory Visit: Payer: Self-pay | Admitting: *Deleted

## 2017-06-21 ENCOUNTER — Ambulatory Visit (INDEPENDENT_AMBULATORY_CARE_PROVIDER_SITE_OTHER): Payer: Self-pay | Admitting: *Deleted

## 2017-06-21 DIAGNOSIS — I480 Paroxysmal atrial fibrillation: Secondary | ICD-10-CM

## 2017-06-21 DIAGNOSIS — Z7901 Long term (current) use of anticoagulants: Secondary | ICD-10-CM

## 2017-06-21 DIAGNOSIS — Z952 Presence of prosthetic heart valve: Secondary | ICD-10-CM

## 2017-06-21 DIAGNOSIS — I4891 Unspecified atrial fibrillation: Secondary | ICD-10-CM

## 2017-06-21 LAB — CBC WITH DIFFERENTIAL/PLATELET
Basophils Absolute: 0 10*3/uL (ref 0.0–0.2)
Basos: 1 %
EOS (ABSOLUTE): 0.1 10*3/uL (ref 0.0–0.4)
Eos: 2 %
Hematocrit: 38.6 % (ref 37.5–51.0)
Hemoglobin: 13.1 g/dL (ref 13.0–17.7)
Immature Grans (Abs): 0 10*3/uL (ref 0.0–0.1)
Immature Granulocytes: 0 %
Lymphocytes Absolute: 1.8 10*3/uL (ref 0.7–3.1)
Lymphs: 31 %
MCH: 30.2 pg (ref 26.6–33.0)
MCHC: 33.9 g/dL (ref 31.5–35.7)
MCV: 89 fL (ref 79–97)
Monocytes Absolute: 0.4 10*3/uL (ref 0.1–0.9)
Monocytes: 7 %
Neutrophils Absolute: 3.3 10*3/uL (ref 1.4–7.0)
Neutrophils: 59 %
Platelets: 172 10*3/uL (ref 150–379)
RBC: 4.34 x10E6/uL (ref 4.14–5.80)
RDW: 13 % (ref 12.3–15.4)
WBC: 5.6 10*3/uL (ref 3.4–10.8)

## 2017-06-21 LAB — POCT INR: INR: 4.5

## 2017-06-21 LAB — BASIC METABOLIC PANEL
BUN/Creatinine Ratio: 16 (ref 9–20)
BUN: 19 mg/dL (ref 6–24)
CO2: 22 mmol/L (ref 20–29)
Calcium: 9.1 mg/dL (ref 8.7–10.2)
Chloride: 103 mmol/L (ref 96–106)
Creatinine, Ser: 1.17 mg/dL (ref 0.76–1.27)
GFR calc Af Amer: 81 mL/min/{1.73_m2} (ref >59)
GFR calc non Af Amer: 70 mL/min/{1.73_m2} (ref >59)
Glucose: 106 mg/dL — ABNORMAL HIGH (ref 65–99)
Potassium: 4.4 mmol/L (ref 3.5–5.2)
Sodium: 142 mmol/L (ref 134–144)

## 2017-06-28 ENCOUNTER — Ambulatory Visit (INDEPENDENT_AMBULATORY_CARE_PROVIDER_SITE_OTHER): Payer: Self-pay | Admitting: *Deleted

## 2017-06-28 DIAGNOSIS — I4891 Unspecified atrial fibrillation: Secondary | ICD-10-CM

## 2017-06-28 DIAGNOSIS — Z5181 Encounter for therapeutic drug level monitoring: Secondary | ICD-10-CM

## 2017-06-28 DIAGNOSIS — Z952 Presence of prosthetic heart valve: Secondary | ICD-10-CM

## 2017-06-28 DIAGNOSIS — Z7901 Long term (current) use of anticoagulants: Secondary | ICD-10-CM

## 2017-06-28 LAB — POCT INR: INR: 3.6

## 2017-06-28 NOTE — Telephone Encounter (Signed)
Patient in today for INR and says he is feeling better and would like to cancel the ablation and CT.  I have canceled and will follow up in 3 months with [name] or sooner in afib clinic if needed

## 2017-06-30 ENCOUNTER — Telehealth: Payer: Self-pay | Admitting: Internal Medicine

## 2017-06-30 NOTE — Telephone Encounter (Signed)
Spoke with patient to reassure that his ablation has been cancelled.  Also spoke with Mark [name] from cath lab to verify cancellation.

## 2017-06-30 NOTE — Telephone Encounter (Signed)
F/u Message ° °Pt is returning RN call. Please call back to discuss °

## 2017-07-02 ENCOUNTER — Other Ambulatory Visit: Payer: Self-pay | Admitting: Internal Medicine

## 2017-07-05 ENCOUNTER — Ambulatory Visit (INDEPENDENT_AMBULATORY_CARE_PROVIDER_SITE_OTHER): Payer: Self-pay | Admitting: *Deleted

## 2017-07-05 DIAGNOSIS — I4891 Unspecified atrial fibrillation: Secondary | ICD-10-CM

## 2017-07-05 NOTE — Progress Notes (Signed)
Carelink Summary Report / Loop Recorder 

## 2017-07-06 ENCOUNTER — Other Ambulatory Visit: Payer: Self-pay | Admitting: Internal Medicine

## 2017-07-07 ENCOUNTER — Ambulatory Visit (HOSPITAL_COMMUNITY): Admit: 2017-07-07 | Payer: Self-pay | Admitting: Internal Medicine

## 2017-07-07 ENCOUNTER — Encounter (HOSPITAL_COMMUNITY): Payer: Self-pay

## 2017-07-07 SURGERY — ATRIAL FIBRILLATION ABLATION
Anesthesia: Monitor Anesthesia Care

## 2017-07-08 LAB — CUP PACEART REMOTE DEVICE CHECK
Date Time Interrogation Session: 20181105131416
Implantable Pulse Generator Implant Date: 20180807

## 2017-07-09 ENCOUNTER — Other Ambulatory Visit (HOSPITAL_COMMUNITY): Payer: Self-pay | Admitting: Internal Medicine

## 2017-07-12 ENCOUNTER — Encounter: Payer: Self-pay | Admitting: Internal Medicine

## 2017-07-19 ENCOUNTER — Ambulatory Visit (INDEPENDENT_AMBULATORY_CARE_PROVIDER_SITE_OTHER): Payer: Self-pay | Admitting: *Deleted

## 2017-07-19 DIAGNOSIS — Z952 Presence of prosthetic heart valve: Secondary | ICD-10-CM

## 2017-07-19 DIAGNOSIS — Z7901 Long term (current) use of anticoagulants: Secondary | ICD-10-CM

## 2017-07-19 DIAGNOSIS — I4891 Unspecified atrial fibrillation: Secondary | ICD-10-CM

## 2017-07-19 LAB — POCT INR: INR: 3.8

## 2017-07-29 ENCOUNTER — Other Ambulatory Visit: Payer: Self-pay

## 2017-07-29 DIAGNOSIS — Z5321 Procedure and treatment not carried out due to patient leaving prior to being seen by health care provider: Secondary | ICD-10-CM | POA: Insufficient documentation

## 2017-07-29 DIAGNOSIS — N4830 Priapism, unspecified: Secondary | ICD-10-CM | POA: Insufficient documentation

## 2017-07-30 ENCOUNTER — Encounter (HOSPITAL_COMMUNITY): Payer: Self-pay | Admitting: Emergency Medicine

## 2017-07-30 ENCOUNTER — Emergency Department (HOSPITAL_COMMUNITY)
Admission: EM | Admit: 2017-07-30 | Discharge: 2017-07-30 | Disposition: A | Discharge status: Home / Self Care | Payer: Self-pay | Attending: Emergency Medicine | Admitting: Emergency Medicine

## 2017-07-30 HISTORY — DX: Male erectile dysfunction, unspecified: N52.9

## 2017-07-30 NOTE — ED Triage Notes (Signed)
Patient reports sustained penile erection for >4 hours after injecting medication at penis at 7 pm .

## 2017-07-30 NOTE — ED Notes (Signed)
Patient called for room x 3, no answer.  

## 2017-08-04 ENCOUNTER — Ambulatory Visit (INDEPENDENT_AMBULATORY_CARE_PROVIDER_SITE_OTHER): Payer: Self-pay | Admitting: *Deleted

## 2017-08-04 DIAGNOSIS — I4891 Unspecified atrial fibrillation: Secondary | ICD-10-CM

## 2017-08-04 NOTE — Progress Notes (Signed)
Carelink Summary Report / Loop Recorder 

## 2017-08-06 ENCOUNTER — Other Ambulatory Visit: Payer: Self-pay | Admitting: Internal Medicine

## 2017-08-13 LAB — CUP PACEART REMOTE DEVICE CHECK
Date Time Interrogation Session: 20181205144147
Implantable Pulse Generator Implant Date: 20180807

## 2017-08-16 ENCOUNTER — Ambulatory Visit (INDEPENDENT_AMBULATORY_CARE_PROVIDER_SITE_OTHER): Payer: Self-pay | Admitting: *Deleted

## 2017-08-16 DIAGNOSIS — Z5181 Encounter for therapeutic drug level monitoring: Secondary | ICD-10-CM

## 2017-08-16 DIAGNOSIS — Z7901 Long term (current) use of anticoagulants: Secondary | ICD-10-CM

## 2017-08-16 DIAGNOSIS — I4891 Unspecified atrial fibrillation: Secondary | ICD-10-CM

## 2017-08-16 DIAGNOSIS — Z952 Presence of prosthetic heart valve: Secondary | ICD-10-CM

## 2017-08-16 LAB — POCT INR: INR: 4.9

## 2017-08-16 NOTE — Patient Instructions (Signed)
Description   Do not take coumadin today Dec 17th then  continue taking 10 mg daily.  Repeat INR in 2 weeks.

## 2017-08-26 ENCOUNTER — Other Ambulatory Visit (HOSPITAL_COMMUNITY): Payer: Self-pay | Admitting: Pharmacist

## 2017-08-26 MED ORDER — VERAPAMIL HCL ER 120 MG PO CP24: 120.0000 mg | ORAL_CAPSULE | Freq: Every day | ORAL | 0 refills | Status: DC

## 2017-09-03 ENCOUNTER — Ambulatory Visit (INDEPENDENT_AMBULATORY_CARE_PROVIDER_SITE_OTHER): Payer: Self-pay | Admitting: *Deleted

## 2017-09-03 DIAGNOSIS — I4891 Unspecified atrial fibrillation: Secondary | ICD-10-CM

## 2017-09-03 NOTE — Progress Notes (Signed)
Carelink Summary Report / Loop Recorder 

## 2017-09-06 ENCOUNTER — Other Ambulatory Visit: Payer: Self-pay | Admitting: Internal Medicine

## 2017-09-06 MED ORDER — CARVEDILOL 6.25 MG PO TABS: 6.2500 mg | ORAL_TABLET | Freq: Two times a day (BID) | ORAL | 2 refills | Status: DC

## 2017-09-08 ENCOUNTER — Other Ambulatory Visit: Payer: Self-pay | Admitting: General Practice

## 2017-09-08 MED ORDER — SIMVASTATIN 20 MG PO TABS: ORAL_TABLET | ORAL | 0 refills | Status: DC

## 2017-09-10 ENCOUNTER — Other Ambulatory Visit (HOSPITAL_COMMUNITY): Payer: Self-pay | Admitting: Internal Medicine

## 2017-09-15 ENCOUNTER — Ambulatory Visit (INDEPENDENT_AMBULATORY_CARE_PROVIDER_SITE_OTHER): Payer: Self-pay | Admitting: *Deleted

## 2017-09-15 DIAGNOSIS — I4891 Unspecified atrial fibrillation: Secondary | ICD-10-CM

## 2017-09-15 DIAGNOSIS — Z952 Presence of prosthetic heart valve: Secondary | ICD-10-CM

## 2017-09-15 DIAGNOSIS — Z7901 Long term (current) use of anticoagulants: Secondary | ICD-10-CM

## 2017-09-15 LAB — CUP PACEART REMOTE DEVICE CHECK
Date Time Interrogation Session: 20190104144049
Implantable Pulse Generator Implant Date: 20180807

## 2017-09-15 LAB — POCT INR: INR: 4.9

## 2017-09-15 NOTE — Patient Instructions (Signed)
Description   Today take only take 5mg , then  continue taking 10 mg daily.  Repeat INR in 3 weeks.

## 2017-09-16 ENCOUNTER — Ambulatory Visit (INDEPENDENT_AMBULATORY_CARE_PROVIDER_SITE_OTHER): Payer: Self-pay

## 2017-09-16 ENCOUNTER — Encounter (INDEPENDENT_AMBULATORY_CARE_PROVIDER_SITE_OTHER): Payer: Self-pay | Admitting: Orthopaedic Surgery

## 2017-09-16 ENCOUNTER — Ambulatory Visit (INDEPENDENT_AMBULATORY_CARE_PROVIDER_SITE_OTHER): Payer: Self-pay | Admitting: Orthopaedic Surgery

## 2017-09-16 VITALS — BP 126/72 | HR 72 | Resp 14 | Ht 71.0 in | Wt 256.0 lb

## 2017-09-16 DIAGNOSIS — M25522 Pain in left elbow: Secondary | ICD-10-CM

## 2017-09-16 DIAGNOSIS — M7022 Olecranon bursitis, left elbow: Secondary | ICD-10-CM

## 2017-09-16 DIAGNOSIS — R2242 Localized swelling, mass and lump, left lower limb: Secondary | ICD-10-CM

## 2017-09-16 DIAGNOSIS — M25862 Other specified joint disorders, left knee: Secondary | ICD-10-CM

## 2017-09-16 MED ORDER — LIDOCAINE HCL 1 % IJ SOLN
2.0000 mL | INTRAMUSCULAR | Status: AC | PRN
  Administered 2017-09-16: 2 mL
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
## 2017-09-20 ENCOUNTER — Encounter (INDEPENDENT_AMBULATORY_CARE_PROVIDER_SITE_OTHER): Payer: Self-pay | Admitting: Orthopaedic Surgery

## 2017-09-20 ENCOUNTER — Ambulatory Visit (INDEPENDENT_AMBULATORY_CARE_PROVIDER_SITE_OTHER): Payer: Self-pay | Admitting: Orthopaedic Surgery

## 2017-09-20 VITALS — Resp 14 | Ht 71.0 in | Wt 256.0 lb

## 2017-09-20 DIAGNOSIS — M71022 Abscess of bursa, left elbow: Secondary | ICD-10-CM
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
## 2017-09-21 ENCOUNTER — Other Ambulatory Visit (HOSPITAL_COMMUNITY): Payer: Self-pay | Admitting: *Deleted

## 2017-09-21 MED ORDER — VERAPAMIL HCL ER 120 MG PO CP24: 120.0000 mg | ORAL_CAPSULE | Freq: Every day | ORAL | 3 refills | Status: DC

## 2017-09-22 ENCOUNTER — Other Ambulatory Visit: Payer: Self-pay

## 2017-09-22 ENCOUNTER — Ambulatory Visit: Payer: Self-pay | Admitting: Family Medicine

## 2017-09-22 ENCOUNTER — Encounter: Payer: Self-pay | Admitting: Family Medicine

## 2017-09-22 VITALS — BP 122/80 | HR 53 | Resp 16 | Ht 71.0 in | Wt 244.5 lb

## 2017-09-22 DIAGNOSIS — E785 Hyperlipidemia, unspecified: Secondary | ICD-10-CM

## 2017-09-22 DIAGNOSIS — Z23 Encounter for immunization: Secondary | ICD-10-CM

## 2017-09-22 DIAGNOSIS — I10 Essential (primary) hypertension: Secondary | ICD-10-CM

## 2017-09-22 DIAGNOSIS — L821 Other seborrheic keratosis: Secondary | ICD-10-CM

## 2017-09-22 LAB — CBC WITH DIFFERENTIAL/PLATELET
Basophils Absolute: 0.1 10*3/uL (ref 0.0–0.1)
Basophils Relative: 0.8 % (ref 0.0–3.0)
Eosinophils Absolute: 0.1 10*3/uL (ref 0.0–0.7)
Eosinophils Relative: 1.7 % (ref 0.0–5.0)
HCT: 41.7 % (ref 39.0–52.0)
Hemoglobin: 13.9 g/dL (ref 13.0–17.0)
Lymphocytes Relative: 29.9 % (ref 12.0–46.0)
Lymphs Abs: 1.9 10*3/uL (ref 0.7–4.0)
MCHC: 33.4 g/dL (ref 30.0–36.0)
MCV: 92 fl (ref 78.0–100.0)
Monocytes Absolute: 0.4 10*3/uL (ref 0.1–1.0)
Monocytes Relative: 7.2 % (ref 3.0–12.0)
Neutro Abs: 3.7 10*3/uL (ref 1.4–7.7)
Neutrophils Relative %: 60.4 % (ref 43.0–77.0)
Platelets: 163 10*3/uL (ref 150.0–400.0)
RBC: 4.54 Mil/uL (ref 4.22–5.81)
RDW: 13.2 % (ref 11.5–15.5)
WBC: 6.2 10*3/uL (ref 4.0–10.5)

## 2017-09-22 LAB — BASIC METABOLIC PANEL
BUN: 16 mg/dL (ref 6–23)
CO2: 24 mEq/L (ref 19–32)
Calcium: 9.1 mg/dL (ref 8.4–10.5)
Chloride: 103 mEq/L (ref 96–112)
Creatinine, Ser: 1.02 mg/dL (ref 0.40–1.50)
GFR: 80.81 mL/min (ref >60.00)
Glucose, Bld: 103 mg/dL — ABNORMAL HIGH (ref 70–99)
Potassium: 4 mEq/L (ref 3.5–5.1)
Sodium: 140 mEq/L (ref 135–145)

## 2017-09-22 LAB — HEPATIC FUNCTION PANEL
ALT: 29 U/L (ref 0–53)
AST: 21 U/L (ref 0–37)
Albumin: 4.4 g/dL (ref 3.5–5.2)
Alkaline Phosphatase: 62 U/L (ref 39–117)
Bilirubin, Direct: 0.2 mg/dL (ref 0.0–0.3)
Total Bilirubin: 1.2 mg/dL (ref 0.2–1.2)
Total Protein: 6.8 g/dL (ref 6.0–8.3)

## 2017-09-22 LAB — LIPID PANEL
Cholesterol: 173 mg/dL (ref 0–200)
HDL: 37.7 mg/dL — ABNORMAL LOW (ref >39.00)
LDL Cholesterol: 98 mg/dL (ref 0–99)
NonHDL: 135.1
Total CHOL/HDL Ratio: 5
Triglycerides: 185 mg/dL — ABNORMAL HIGH (ref 0.0–149.0)
VLDL: 37 mg/dL (ref 0.0–40.0)

## 2017-09-22 LAB — TSH: TSH: 1.78 u[IU]/mL (ref 0.35–4.50)

## 2017-09-22 NOTE — Patient Instructions (Addendum)
Schedule your complete physical in 6 months We'll notify you of your lab results and make any changes if needed The areas on the skin are benign and you are likely to get more of them Call with any questions or concerns Enjoy your cruise!!!

## 2017-09-22 NOTE — Progress Notes (Signed)
   Subjective:    Patient ID: Mark [name], male    DOB: Dec 22, 1960, 57 y.o.   MRN: [id number]  HPI HTN- chronic problem, on Coreg 6.25mg  BID, Dilt 60mg  PRN, Lisinopril 40mg  daily, Verapamil 120mg  QHS w/ good control.  Intermittent CP (cards aware), SOB only w/ Afib, + HAs due to Coreg.  No visual changes.    Hyperlipidemia- chronic problem, on Simvastatin 20mg  daily.  No abd pain, N/V.  Skin changes- pt notes scattered scaling places on R thigh.  Was able to scratch 2 off.  Not itchy but rough in texture.  Review of Systems For ROS see HPI     Objective:   Physical Exam  Constitutional: He is oriented to person, place, and time. He appears well-developed and well-nourished. No distress.  HENT:  Head: Normocephalic and atraumatic.  Eyes: Conjunctivae and EOM are normal. Pupils are equal, round, and reactive to light.  Neck: Normal range of motion. Neck supple. No thyromegaly present.  Cardiovascular: Normal rate, regular rhythm and intact distal pulses.  Murmur (Aortic murmur w/ mechanical click) heard. Pulmonary/Chest: Effort normal and breath sounds normal. No respiratory distress.  Abdominal: Soft. Bowel sounds are normal. He exhibits no distension.  Musculoskeletal: He exhibits no edema.  Lymphadenopathy:    He has no cervical adenopathy.  Neurological: He is alert and oriented to person, place, and time. No cranial nerve deficit.  Skin: Skin is warm and dry.  Seborrheic keratosis on R upper thigh  Psychiatric: He has a normal mood and affect. His behavior is normal.  Vitals reviewed.         Assessment & Plan:  Seborrheic keratosis- reviewed dx and benign nature of skin changes.  Also reviewed what to look for going forward in case of skin changes.  Pt expressed understanding and is in agreement w/ plan.

## 2017-09-22 NOTE — Assessment & Plan Note (Signed)
Chronic problem.  Tolerating statin w/o difficulty.  Check labs.  Adjust meds prn  

## 2017-09-22 NOTE — Assessment & Plan Note (Signed)
Chronic problem.  Adequate control.  + CP- following w/ cards, HA due to beta blocker (cards aware).  Check labs.  No anticipated med changes.  Will follow.

## 2017-09-23 ENCOUNTER — Encounter: Payer: Self-pay | Admitting: General Practice

## 2017-09-29 ENCOUNTER — Ambulatory Visit (INDEPENDENT_AMBULATORY_CARE_PROVIDER_SITE_OTHER): Payer: Self-pay | Admitting: Physician Assistant

## 2017-09-29 ENCOUNTER — Ambulatory Visit
Admission: RE | Admit: 2017-09-29 | Discharge: 2017-09-29 | Disposition: A | Discharge status: Home / Self Care | Payer: Self-pay | Source: Ambulatory Visit | Attending: Physician Assistant | Admitting: Physician Assistant

## 2017-09-29 ENCOUNTER — Encounter: Payer: Self-pay | Admitting: Physician Assistant

## 2017-09-29 ENCOUNTER — Other Ambulatory Visit: Payer: Self-pay

## 2017-09-29 ENCOUNTER — Telehealth: Payer: Self-pay | Admitting: Physician Assistant

## 2017-09-29 ENCOUNTER — Encounter: Payer: Self-pay | Admitting: *Deleted

## 2017-09-29 DIAGNOSIS — R51 Headache: Secondary | ICD-10-CM

## 2017-09-29 DIAGNOSIS — R519 Headache, unspecified: Secondary | ICD-10-CM

## 2017-09-29 MED ORDER — GABAPENTIN 100 MG PO CAPS: 100.0000 mg | ORAL_CAPSULE | Freq: Three times a day (TID) | ORAL | 1 refills | Status: DC

## 2017-09-29 MED ORDER — IOPAMIDOL (ISOVUE-370) INJECTION 76%
75.0000 mL | Freq: Once | INTRAVENOUS | Status: AC | PRN
  Administered 2017-09-29: 75 mL via INTRAVENOUS

## 2017-09-29 NOTE — Telephone Encounter (Signed)
Carollee HerterShannon  From  [facility]  Calling  CT  Angio  Of head  . RESULTS   Negative  Exam   No  Acute findings  No  Evidence of  Subarachnoid  Or  Parenchymal  Hemorrhage   No  Evidence  Of  Intercranial  Saccular aneurysm   .   [facility]  [facility]  [facility]  Notified   They  Actually  Just  Got a  Fax from  CT .  Ok  To  Send  Patient  Home  They will call  Pt  With  Results .

## 2017-09-29 NOTE — Telephone Encounter (Signed)
This encounter was created in error - please disregard.

## 2017-09-29 NOTE — Progress Notes (Signed)
Patient presents to clinic today c/o headache since Friday, first noted in the occipital region, mainly R-sided. Non radiating. Notes nausea, but denies vomiting. Denies photophobia, phonophobia, vision changes. Denies trauma or injury. Denies worsening pain with ROM. Denies fatigue. Headache is waking him from sleep. Denies lightheadedness or dizziness. Denies AMS. Has a fib and taking medications as directed. Is on coumadin for anticoagulation. Patient feels headache is a new-type of headache.   Past Medical History:  Diagnosis Date  . Aortic stenosis    a. due to bicuspid AoV; 1983 S/p mechanical AVR (St. Jude) - chronic coumadin with INR run between 3.5-4.5;  b. 03/2012 Echo: EF 60%, nl wall motion, mod dil LA.  Marland Kitchen Ascending aortic aneurysm (HCC)   . Atrial tachycardia (HCC)    a. admitted 07/2012  . Coronary artery disease   . ED (erectile dysfunction)   . Gallstones   . H/O cardiac catheterization    a. 12/2007 Cath: nl cors.  . Headache(784.0)   . 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 Hypertension   . OSA (obstructive sleep apnea)    a. noncompliant with CPAP  . Paroxysmal atrial fibrillation (HCC)    s/p afib ablation x 2    Current Outpatient Medications on File Prior to Visit  Medication Sig Dispense Refill  . aspirin EC 81 MG tablet Take 81 mg by mouth at bedtime.     . carvedilol (COREG) 6.25 MG tablet Take 1 tablet (6.25 mg total) by mouth 2 (two) times daily. 180 tablet 2  . diltiazem (CARDIZEM) 60 MG tablet Take one tablet by mouth every 6 hours as needed for afib 30 tablet 1  . esomeprazole (NEXIUM) 40 MG capsule Take 40 mg by mouth at bedtime.    Marland Kitchen lisinopril (PRINIVIL,ZESTRIL) 40 MG tablet Take 1 tablet (40 mg total) by mouth daily. Please call our office for follow up. Thanks 30 tablet 5  . meclizine (ANTIVERT) 25 MG tablet Take 1 tablet (25 mg total) by mouth 3 (three) times daily as needed for dizziness. 30 tablet 0  . ondansetron (ZOFRAN ODT) 4 MG  disintegrating tablet Take 1 tablet (4 mg total) by mouth every 8 (eight) hours as needed for nausea or vomiting. 20 tablet 0  . simvastatin (ZOCOR) 20 MG tablet TAKE 1 TABLET BY MOUTH EVERYDAY AT BEDTIME 90 tablet 0  . verapamil (VERELAN PM) 120 MG 24 hr capsule Take 1 capsule (120 mg total) by mouth at bedtime. 90 capsule 3  . warfarin (COUMADIN) 10 MG tablet TAKE AS DIRECTED BY COUMADIN CLINIC 40 tablet 1   No current facility-administered medications on file prior to visit.     Allergies  Allergen Reactions  . Codeine Nausea And Vomiting  . Morphine And Related Nausea And Vomiting  . Percocet [Oxycodone-Acetaminophen] Nausea And Vomiting    Family History  Problem Relation Age of Onset  . Lung cancer Father   . Bradycardia Mother     Social History   Socioeconomic History  . Marital status: Married    Spouse name: None  . Number of children: 5  . Years of education: None  . Highest education level: None  Social Needs  . Financial resource strain: None  . Food insecurity - worry: None  . Food insecurity - inability: None  . Transportation needs - medical: None  . Transportation needs - non-medical: None  Occupational History  . Occupation: heating & air  Tobacco Use  .  Smoking status: Never Smoker  . Smokeless tobacco: Never Used  Substance and Sexual Activity  . Alcohol use: No  . Drug use: No  . Sexual activity: Yes  Other Topics Concern  . None  Social History Narrative   [place] in [place], [place] with [place].  Works as a Information systems managerheat and AC specialist   Review of Systems - See HPI.  All other ROS are negative.  BP 130/84   Pulse (!) 57   Temp 98.6 F (37 C) (Oral)   Resp 14   Ht 5\' 11"  (1.803 m)   Wt 239 lb (108.4 kg)   SpO2 98%   BMI 33.33 kg/m   Physical Exam  Constitutional: He is oriented to person, place, and time and well-developed, well-nourished, and in no distress.  HENT:  Head: Normocephalic and atraumatic.  Right Ear: External ear normal.  Left  Ear: External ear normal.  Nose: Nose normal.  Mouth/Throat: Oropharynx is clear and moist. No oropharyngeal exudate.  Eyes: Conjunctivae and EOM are normal. Pupils are equal, round, and reactive to light.  Neck: Neck supple.  Cardiovascular: Normal rate, normal heart sounds and intact distal pulses.  Pulmonary/Chest: Effort normal and breath sounds normal. No respiratory distress. He has no wheezes. He has no rales. He exhibits no tenderness.  Neurological: He is alert and oriented to person, place, and time. He has normal sensation and intact cranial nerves. He displays facial symmetry. He has a normal Cerebellar Exam. Gait normal. Coordination normal.  Skin: Skin is warm and dry. No rash noted.     Vitals reviewed.  Recent Results (from the past 2160 hour(s))  CUP PACEART REMOTE DEVICE CHECK     Status: None   Collection Time: 07/05/17  1:14 PM  Result Value Ref Range   Date Time Interrogation Session [id number]    Pulse Generator Manufacturer MERM    Pulse Gen Model [id number] Reveal LINQ    Pulse Gen Serial Number [id number]    Cl[facility]    Implantable Pulse Generator Type ICM/ILR    Implantable Pulse Generator Implant Date [id number]   POCT INR     Status: None   Collection Time: 07/19/17  7:54 AM  Result Value Ref Range   INR 3.8   CUP PACEART REMOTE DEVICE CHECK     Status: None   Collection Time: 08/04/17  2:41 PM  Result Value Ref Range   Date Time Interrogation Session [id number]    Pulse Generator Manufacturer MERM    Pulse Gen Model [id number] Reveal LINQ    Pulse Gen Serial Number [id number]    [facility]    Implantable Pulse Generator Type ICM/ILR    Implantable Pulse Generator Implant Date [id number]    Eval Rhythm SB   POCT INR     Status: None   Collection Time: 08/16/17  7:40 AM  Result Value Ref Range   INR 4.9   CUP PACEART REMOTE DEVICE CHECK     Status: None   Collection Time: 09/03/17  2:40 PM  Result Value Ref  Range   Date Time Interrogation Session [id number]    Pulse Generator Manufacturer MERM    Pulse Gen Model [id number] Reveal LINQ    Pulse Gen Serial Number [id number]    Cl[facility] Name [facility]    Implantable Pulse Generator Type ICM/ILR    Implantable Pulse Generator Implant Date [id number]    Eval Rhythm SR   POCT INR     Status: None  Collection Time: 09/15/17  7:42 AM  Result Value Ref Range   INR 4.9   Lipid panel     Status: Abnormal   Collection Time: 09/22/17  9:07 AM  Result Value Ref Range   Cholesterol 173 0 - 200 mg/dL    Comment: ATP III Classification       Desirable:  < 200 mg/dL               Borderline High:  200 - 239 mg/dL          High:  > = 119 mg/dL   Triglycerides 147.8 (H) 0.0 - 149.0 mg/dL    Comment: Normal:  <295 mg/dLBorderline High:  150 - 199 mg/dL   HDL 62.13 (L) >08.65 mg/dL   VLDL 78.4 0.0 - 69.6 mg/dL   LDL Cholesterol 98 0 - 99 mg/dL   Total CHOL/HDL Ratio 5     Comment:                Men          Women1/2 Average Risk     3.4          3.3Average Risk          5.0          4.42X Average Risk          9.6          7.13X Average Risk          15.0          11.0                       NonHDL 135.10     Comment: NOTE:  Non-HDL goal should be 30 mg/dL higher than patient's LDL goal (i.e. LDL goal of < 70 mg/dL, would have non-HDL goal of < 100 mg/dL)  Basic metabolic panel     Status: Abnormal   Collection Time: 09/22/17  9:07 AM  Result Value Ref Range   Sodium 140 135 - 145 mEq/L   Potassium 4.0 3.5 - 5.1 mEq/L   Chloride 103 96 - 112 mEq/L   CO2 24 19 - 32 mEq/L   Glucose, Bld 103 (H) 70 - 99 mg/dL   BUN 16 6 - 23 mg/dL   Creatinine, Ser 2.95 0.40 - 1.50 mg/dL   Calcium 9.1 8.4 - 28.4 mg/dL   GFR 13.24 >40.10 mL/min  Hepatic function panel     Status: None   Collection Time: 09/22/17  9:07 AM  Result Value Ref Range   Total Bilirubin 1.2 0.2 - 1.2 mg/dL   Bilirubin, Direct 0.2 0.0 - 0.3 mg/dL   Alkaline Phosphatase 62 39 - 117 U/L    AST 21 0 - 37 U/L   ALT 29 0 - 53 U/L   Total Protein 6.8 6.0 - 8.3 g/dL   Albumin 4.4 3.5 - 5.2 g/dL  TSH     Status: None   Collection Time: 09/22/17  9:07 AM  Result Value Ref Range   TSH 1.78 0.35 - 4.50 uIU/mL  CBC with Differential/Platelet     Status: None   Collection Time: 09/22/17  9:07 AM  Result Value Ref Range   WBC 6.2 4.0 - 10.5 K/uL   RBC 4.54 4.22 - 5.81 Mil/uL   Hemoglobin 13.9 13.0 - 17.0 g/dL   HCT 27.2 53.6 - 64.4 %   MCV 92.0 78.0 - 100.0 fl   MCHC 33.4 30.0 -  36.0 g/dL   RDW 16.1 09.6 - 04.5 %   Platelets 163.0 150.0 - 400.0 K/uL   Neutrophils Relative % 60.4 43.0 - 77.0 %   Lymphocytes Relative 29.9 12.0 - 46.0 %   Monocytes Relative 7.2 3.0 - 12.0 %   Eosinophils Relative 1.7 0.0 - 5.0 %   Basophils Relative 0.8 0.0 - 3.0 %   Neutro Abs 3.7 1.4 - 7.7 K/uL   Lymphs Abs 1.9 0.7 - 4.0 K/uL   Monocytes Absolute 0.4 0.1 - 1.0 K/uL   Eosinophils Absolute 0.1 0.0 - 0.7 K/uL   Basophils Absolute 0.1 0.0 - 0.1 K/uL   Assessment/Plan: 1. Acute intractable headache, unspecified headache type Seems consistent with a occipital neuralgia but with patient extensive cardio and neurological history, needs further assessment. Neurological exam within normal limits. Will start Gabapentin. STAT CTA to be obtained today. Strict ER precautions reviewed. Tylenol for breakthrough pain.  - CT ANGIO HEAD W OR WO CONTRAST; Future   Piedad Climes, PA-C

## 2017-09-29 NOTE — Patient Instructions (Signed)
Symptoms seem consistent with an occipital neuralgia -- irritation in the nerves of the occipital region of your skull.  Thankfully there has been no trauma or injury and your neurological exam is normal. Giving your health history, however, we are going to obtain imaging today to further assess.   Start the Gabapentin once you are home and take as directed. It may make you drowsy with the first few doses, so no driving while adjusting to the medication.   I want to see you Friday for reassessment.  Can continue tylenol with the Gabapentin.  Any acute worsening of symptoms, please go to the ER.

## 2017-10-01 ENCOUNTER — Ambulatory Visit: Payer: Self-pay | Admitting: Physician Assistant

## 2017-10-01 ENCOUNTER — Other Ambulatory Visit: Payer: Self-pay

## 2017-10-01 ENCOUNTER — Encounter: Payer: Self-pay | Admitting: Physician Assistant

## 2017-10-01 VITALS — BP 120/80 | HR 62 | Temp 98.2°F | Resp 16 | Ht 71.0 in | Wt 243.1 lb

## 2017-10-01 DIAGNOSIS — B029 Zoster without complications: Secondary | ICD-10-CM

## 2017-10-01 MED ORDER — VALACYCLOVIR HCL 1 G PO TABS: 1000.0000 mg | ORAL_TABLET | Freq: Three times a day (TID) | ORAL | 0 refills | Status: DC

## 2017-10-01 NOTE — Patient Instructions (Signed)
Please take the Valtrex three times daily as directed for 1 week. Increase nighttime Gabapentin to 300 mg (3 tablets). Continue same morning and noon doses.  We will be able to stop this medication once this is resolved. Apply topical OTc lidocaine cream to the area to numb. Keep area covered when out and about as you can transmit virus to elderly, young and pregnant women (baby) and give them chicken pox.    Shingles Shingles is an infection that causes a painful skin rash and fluid-filled blisters. Shingles is caused by the same virus that causes chickenpox. Shingles only develops in people who:  Have had chickenpox.  Have gotten the chickenpox vaccine. (This is rare.)  The first symptoms of shingles may be itching, tingling, or pain in an area on your skin. A rash will follow in a few days or weeks. The rash is usually on one side of the body in a bandlike or beltlike pattern. Over time, the rash turns into fluid-filled blisters that break open, scab over, and dry up. Medicines may:  Help you manage pain.  Help you recover more quickly.  Help to prevent long-term problems.  Follow these instructions at home: Medicines  Take medicines only as told by your doctor.  Apply an anti-itch or numbing cream to the affected area as told by your doctor. Blister and Rash Care  Take a cool bath or put cool compresses on the area of the rash or blisters as told by your doctor. This may help with pain and itching.  Keep your rash covered with a loose bandage (dressing). Wear loose-fitting clothing.  Keep your rash and blisters clean with mild soap and cool water or as told by your doctor.  Check your rash every day for signs of infection. These include redness, swelling, and pain that lasts or gets worse.  Do not pick your blisters.  Do not scratch your rash. General instructions  Rest as told by your doctor.  Keep all follow-up visits as told by your doctor. This is  important.  Until your blisters scab over, your infection can cause chickenpox in people who have never had it or been vaccinated against it. To prevent this from happening, avoid touching other people or being around other people, especially: ? Babies. ? Pregnant women. ? Children who have eczema. ? Elderly people who have transplants. ? People who have chronic illnesses, such as leukemia or AIDS. Contact a doctor if:  Your pain does not get better with medicine.  Your pain does not get better after the rash heals.  Your rash looks infected. Signs of infection include: ? Redness. ? Swelling. ? Pain that lasts or gets worse. Get help right away if:  The rash is on your face or nose.  You have pain in your face, pain around your eye area, or loss of feeling on one side of your face.  You have ear pain or you have ringing in your ear.  You have loss of taste.  Your condition gets worse. This information is not intended to replace advice given to you by your health care provider. Make sure you discuss any questions you have with your health care provider. Document Released: 02/03/2008 Document Revised: 04/12/2016 Document Reviewed: 05/29/2014 Elsevier Interactive Patient Education  [employer]2018 Elsevier Inc.

## 2017-10-01 NOTE — Progress Notes (Signed)
Patient presents to clinic today c/o continued pain in R occipital region, now with worsening skin sensitivity and pain. Was seen earlier in the week with concern for occipital neuralgia. Imaging obtained due to history of aneurysm and thankfully was unremarkable. Started on Gabapentin which he is tolerating well. Notes some improvement in pain. Has noted blisters in the area since early this morning.   Past Medical History:  Diagnosis Date  . Aortic stenosis    a. due to bicuspid AoV; 1983 S/p mechanical AVR (St. Jude) - chronic coumadin with INR run between 3.5-4.5;  b. 03/2012 Echo: EF 60%, nl wall motion, mod dil LA.  Marland Kitchen Ascending aortic aneurysm (HCC)   . Atrial tachycardia (HCC)    a. admitted 07/2012  . Coronary artery disease   . ED (erectile dysfunction)   . Gallstones   . H/O cardiac catheterization    a. 12/2007 Cath: nl cors.  . Headache(784.0)   . 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 Hypertension   . OSA (obstructive sleep apnea)    a. noncompliant with CPAP  . Paroxysmal atrial fibrillation (HCC)    s/p afib ablation x 2    Current Outpatient Medications on File Prior to Visit  Medication Sig Dispense Refill  . aspirin EC 81 MG tablet Take 81 mg by mouth at bedtime.     . carvedilol (COREG) 6.25 MG tablet Take 1 tablet (6.25 mg total) by mouth 2 (two) times daily. 180 tablet 2  . diltiazem (CARDIZEM) 60 MG tablet Take one tablet by mouth every 6 hours as needed for afib 30 tablet 1  . esomeprazole (NEXIUM) 40 MG capsule Take 40 mg by mouth at bedtime.    . gabapentin (NEURONTIN) 100 MG capsule Take 1 capsule (100 mg total) by mouth 3 (three) times daily. 90 capsule 1  . lisinopril (PRINIVIL,ZESTRIL) 40 MG tablet Take 1 tablet (40 mg total) by mouth daily. Please call our office for follow up. Thanks 30 tablet 5  . meclizine (ANTIVERT) 25 MG tablet Take 1 tablet (25 mg total) by mouth 3 (three) times daily as needed for dizziness. 30 tablet 0  . ondansetron  (ZOFRAN ODT) 4 MG disintegrating tablet Take 1 tablet (4 mg total) by mouth every 8 (eight) hours as needed for nausea or vomiting. 20 tablet 0  . simvastatin (ZOCOR) 20 MG tablet TAKE 1 TABLET BY MOUTH EVERYDAY AT BEDTIME 90 tablet 0  . verapamil (VERELAN PM) 120 MG 24 hr capsule Take 1 capsule (120 mg total) by mouth at bedtime. 90 capsule 3  . warfarin (COUMADIN) 10 MG tablet TAKE AS DIRECTED BY COUMADIN CLINIC 40 tablet 1   No current facility-administered medications on file prior to visit.     Allergies  Allergen Reactions  . Codeine Nausea And Vomiting  . Morphine And Related Nausea And Vomiting  . Percocet [Oxycodone-Acetaminophen] Nausea And Vomiting    Family History  Problem Relation Age of Onset  . Lung cancer Father   . Bradycardia Mother     Social History   Socioeconomic History  . Marital status: Married    Spouse name: None  . Number of children: 5  . Years of education: None  . Highest education level: None  Social Needs  . Financial resource strain: None  . Food insecurity - worry: None  . Food insecurity - inability: None  . Transportation needs - medical: None  . Transportation needs - non-medical: None  Occupational History  . Occupation: heating & air  Tobacco Use  . Smoking status: Never Smoker  . Smokeless tobacco: Never Used  Substance and Sexual Activity  . Alcohol use: No  . Drug use: No  . Sexual activity: Yes  Other Topics Concern  . None  Social History Narrative   Lives in [place], [place] with wife.  Works as a Information systems manager   Review of Systems - See HPI.  All other ROS are negative.  BP 120/80   Pulse 62   Temp 98.2 F (36.8 C) (Oral)   Resp 16   Ht 5\' 11"  (1.803 m)   Wt 243 lb 2 oz (110.3 kg)   SpO2 98%   BMI 33.91 kg/m   Physical Exam  Constitutional: He is oriented to person, place, and time and well-developed, well-nourished, and in no distress.  HENT:  Head: Normocephalic and atraumatic.  Eyes: Conjunctivae  are normal.  Neck: Neck supple.  Cardiovascular: Normal rate, regular rhythm, normal heart sounds and intact distal pulses.  Pulmonary/Chest: Effort normal and breath sounds normal. No respiratory distress. He has no wheezes. He has no rales. He exhibits no tenderness.  Neurological: He is alert and oriented to person, place, and time.  Skin: Skin is warm and dry.     Vitals reviewed.   Recent Results (from the past 2160 hour(s))  CUP PACEART REMOTE DEVICE CHECK     Status: None   Collection Time: 07/05/17  1:14 PM  Result Value Ref Range   Date Time Interrogation Session [id number]    Pulse Generator Manufacturer MERM    Pulse Gen Model [id number] Reveal LINQ    Pulse Gen Serial Number [id number]    Clinic Name [facility]    Implantable Pulse Generator Type ICM/ILR    Implantable Pulse Generator Implant Date [id number]   POCT INR     Status: None   Collection Time: 07/19/17  7:54 AM  Result Value Ref Range   INR 3.8   CUP PACEART REMOTE DEVICE CHECK     Status: None   Collection Time: 08/04/17  2:41 PM  Result Value Ref Range   Date Time Interrogation Session [id number]    Pulse Generator Manufacturer MERM    Pulse Gen Model [id number] Reveal LINQ    Pulse Gen Serial Number [id number]    Clinic Name [facility]    Implantable Pulse Generator Type ICM/ILR    Implantable Pulse Generator Implant Date [id number]    Eval Rhythm SB   POCT INR     Status: None   Collection Time: 08/16/17  7:40 AM  Result Value Ref Range   INR 4.9   CUP PACEART REMOTE DEVICE CHECK     Status: None   Collection Time: 09/03/17  2:40 PM  Result Value Ref Range   Date Time Interrogation Session [id number]    Pulse Generator Manufacturer MERM    Pulse Gen Model [id number] Reveal LINQ    Pulse Gen Serial Number [id number]    Clinic Name [facility]    Implantable Pulse Generator Type ICM/ILR    Implantable Pulse Generator Implant Date [id number]    Eval Rhythm SR   POCT INR      Status: None   Collection Time: 09/15/17  7:42 AM  Result Value Ref Range   INR 4.9   Lipid panel     Status: Abnormal   Collection Time: 09/22/17  9:07 AM  Result Value Ref Range   Cholesterol 173 0 -  200 mg/dL    Comment: ATP III Classification       Desirable:  < 200 mg/dL               Borderline High:  200 - 239 mg/dL          High:  > = 782 mg/dL   Triglycerides 956.2 (H) 0.0 - 149.0 mg/dL    Comment: Normal:  <130 mg/dLBorderline High:  150 - 199 mg/dL   HDL 86.57 (L) >84.69 mg/dL   VLDL 62.9 0.0 - 52.8 mg/dL   LDL Cholesterol 98 0 - 99 mg/dL   Total CHOL/HDL Ratio 5     Comment:                Men          Women1/2 Average Risk     3.4          3.3Average Risk          5.0          4.42X Average Risk          9.6          7.13X Average Risk          15.0          11.0                       NonHDL 135.10     Comment: NOTE:  Non-HDL goal should be 30 mg/dL higher than patient's LDL goal (i.e. LDL goal of < 70 mg/dL, would have non-HDL goal of < 100 mg/dL)  Basic metabolic panel     Status: Abnormal   Collection Time: 09/22/17  9:07 AM  Result Value Ref Range   Sodium 140 135 - 145 mEq/L   Potassium 4.0 3.5 - 5.1 mEq/L   Chloride 103 96 - 112 mEq/L   CO2 24 19 - 32 mEq/L   Glucose, Bld 103 (H) 70 - 99 mg/dL   BUN 16 6 - 23 mg/dL   Creatinine, Ser 4.13 0.40 - 1.50 mg/dL   Calcium 9.1 8.4 - 24.4 mg/dL   GFR 01.02 >72.53 mL/min  Hepatic function panel     Status: None   Collection Time: 09/22/17  9:07 AM  Result Value Ref Range   Total Bilirubin 1.2 0.2 - 1.2 mg/dL   Bilirubin, Direct 0.2 0.0 - 0.3 mg/dL   Alkaline Phosphatase 62 39 - 117 U/L   AST 21 0 - 37 U/L   ALT 29 0 - 53 U/L   Total Protein 6.8 6.0 - 8.3 g/dL   Albumin 4.4 3.5 - 5.2 g/dL  TSH     Status: None   Collection Time: 09/22/17  9:07 AM  Result Value Ref Range   TSH 1.78 0.35 - 4.50 uIU/mL  CBC with Differential/Platelet     Status: None   Collection Time: 09/22/17  9:07 AM  Result Value Ref Range    WBC 6.2 4.0 - 10.5 K/uL   RBC 4.54 4.22 - 5.81 Mil/uL   Hemoglobin 13.9 13.0 - 17.0 g/dL   HCT 66.4 40.3 - 47.4 %   MCV 92.0 78.0 - 100.0 fl   MCHC 33.4 30.0 - 36.0 g/dL   RDW 25.9 56.3 - 87.5 %   Platelets 163.0 150.0 - 400.0 K/uL   Neutrophils Relative % 60.4 43.0 - 77.0 %   Lymphocytes Relative 29.9 12.0 - 46.0 %   Monocytes Relative 7.2 3.0 -  12.0 %   Eosinophils Relative 1.7 0.0 - 5.0 %   Basophils Relative 0.8 0.0 - 3.0 %   Neutro Abs 3.7 1.4 - 7.7 K/uL   Lymphs Abs 1.9 0.7 - 4.0 K/uL   Monocytes Absolute 0.4 0.1 - 1.0 K/uL   Eosinophils Absolute 0.1 0.0 - 0.7 K/uL   Basophils Absolute 0.1 0.0 - 0.1 K/uL   Assessment/Plan: 1. Herpes zoster without complication Start Valtrex TID x 7 days. Topical Lidocaine to the area. Increase PM Gabapentin to 300 mg. OTC pain relievers reviewed. Discussed contagious nature of active shingles lesions. Keep area covered until all blisters are scabbed over.  - valACYclovir (VALTREX) 1000 MG tablet; Take 1 tablet (1,000 mg total) by mouth 3 (three) times daily.  Dispense: 21 tablet; Refill: 0   Piedad ClimesWilliam Cody Treyten Monestime, PA-C

## 2017-10-04 ENCOUNTER — Ambulatory Visit (INDEPENDENT_AMBULATORY_CARE_PROVIDER_SITE_OTHER): Payer: Self-pay | Admitting: *Deleted

## 2017-10-04 ENCOUNTER — Telehealth: Payer: Self-pay | Admitting: Family Medicine

## 2017-10-04 ENCOUNTER — Encounter: Payer: Self-pay | Admitting: Internal Medicine

## 2017-10-04 DIAGNOSIS — Z7901 Long term (current) use of anticoagulants: Secondary | ICD-10-CM

## 2017-10-04 DIAGNOSIS — I4891 Unspecified atrial fibrillation: Secondary | ICD-10-CM

## 2017-10-04 DIAGNOSIS — Z952 Presence of prosthetic heart valve: Secondary | ICD-10-CM

## 2017-10-04 LAB — POCT INR: INR: 5.3

## 2017-10-04 MED ORDER — SCOPOLAMINE 1 MG/3DAYS TD PT72: 1.0000 | MEDICATED_PATCH | TRANSDERMAL | 0 refills | Status: DC

## 2017-10-04 NOTE — Telephone Encounter (Signed)
Routing to PCP to advise

## 2017-10-04 NOTE — Telephone Encounter (Signed)
Copied from CRM [phone]#[id number]. Topic: Quick Communication - Rx Refill/Question >> Oct 04, 2017  3:18 PM Crist InfanteHarrald, Kathy J wrote: Medication:  sea sickness patch Pt going on cruise and would rx for this med sent to  [id number] - [place], [place] - [address] [phone] (Phone) [phone] (Fax)

## 2017-10-04 NOTE — Patient Instructions (Signed)
Description   Do not take any Coumadin today then start taking 10 mg daily except 5mg  on Fridays.  Repeat INR in 2 weeks.

## 2017-10-04 NOTE — Progress Notes (Signed)
Carelink Summary Reprot / Loop Recorder 

## 2017-10-04 NOTE — Telephone Encounter (Signed)
Ok for scopolamine patch- change patch q72 hrs while traveling, disp 1 box (4 patches)

## 2017-10-04 NOTE — Addendum Note (Signed)
Addended by: Geannie RisenBRODMERKEL, JESSICA L on: 10/04/2017 03:54 PM   Modules accepted: Orders

## 2017-10-04 NOTE — Telephone Encounter (Signed)
Medication filled to pharmacy as requested.   

## 2017-10-05 ENCOUNTER — Telehealth: Payer: Self-pay | Admitting: Family Medicine

## 2017-10-05 ENCOUNTER — Other Ambulatory Visit: Payer: Self-pay | Admitting: *Deleted

## 2017-10-05 ENCOUNTER — Ambulatory Visit: Payer: Self-pay | Admitting: *Deleted

## 2017-10-05 NOTE — Telephone Encounter (Signed)
Pt and his wife.

## 2017-10-05 NOTE — Telephone Encounter (Signed)
Pt and his wife Mark [name]  [birth year]04/1963 are requesting patches for sea sickness.   They are going on a 10 day cruise.  [facility] [place] [place]   [address]   [phone]

## 2017-10-05 NOTE — Telephone Encounter (Signed)
This was already done for patient (see previous phone note).   Will create a note for patient's wife in her chart and send to provider.

## 2017-10-05 NOTE — Telephone Encounter (Signed)
Request handled yesterday.

## 2017-10-05 NOTE — Telephone Encounter (Signed)
Started in error

## 2017-10-08 ENCOUNTER — Other Ambulatory Visit: Payer: Self-pay | Admitting: Internal Medicine

## 2017-10-11 ENCOUNTER — Encounter: Payer: Self-pay | Admitting: Internal Medicine

## 2017-10-19 ENCOUNTER — Ambulatory Visit (INDEPENDENT_AMBULATORY_CARE_PROVIDER_SITE_OTHER): Payer: Self-pay | Admitting: Pharmacist

## 2017-10-19 DIAGNOSIS — Z952 Presence of prosthetic heart valve: Secondary | ICD-10-CM

## 2017-10-19 DIAGNOSIS — I4891 Unspecified atrial fibrillation: Secondary | ICD-10-CM

## 2017-10-19 DIAGNOSIS — Z7901 Long term (current) use of anticoagulants: Secondary | ICD-10-CM

## 2017-10-19 LAB — POCT INR: INR: 4.1

## 2017-10-19 NOTE — Patient Instructions (Signed)
Description   Continue 10 mg daily except 5mg  on Fridays.  Repeat INR in 3 weeks.

## 2017-10-25 ENCOUNTER — Encounter: Payer: Self-pay | Admitting: Internal Medicine

## 2017-10-26 LAB — CUP PACEART REMOTE DEVICE CHECK
Date Time Interrogation Session: 20190203154021
Implantable Pulse Generator Implant Date: 20180807

## 2017-10-28 ENCOUNTER — Ambulatory Visit (HOSPITAL_COMMUNITY)
Admission: RE | Admit: 2017-10-28 | Discharge: 2017-10-28 | Disposition: A | Discharge status: Home / Self Care | Payer: Self-pay | Source: Ambulatory Visit | Attending: Internal Medicine | Admitting: Internal Medicine

## 2017-10-28 ENCOUNTER — Encounter (HOSPITAL_COMMUNITY): Payer: Self-pay | Admitting: Internal Medicine

## 2017-10-28 ENCOUNTER — Other Ambulatory Visit: Payer: Self-pay

## 2017-10-28 VITALS — BP 140/78 | HR 62 | Wt 240.0 lb

## 2017-10-28 DIAGNOSIS — I480 Paroxysmal atrial fibrillation: Secondary | ICD-10-CM | POA: Insufficient documentation

## 2017-10-28 DIAGNOSIS — G4733 Obstructive sleep apnea (adult) (pediatric): Secondary | ICD-10-CM | POA: Insufficient documentation

## 2017-10-28 DIAGNOSIS — Z952 Presence of prosthetic heart valve: Secondary | ICD-10-CM | POA: Insufficient documentation

## 2017-10-28 DIAGNOSIS — Z79899 Other long term (current) drug therapy: Secondary | ICD-10-CM | POA: Insufficient documentation

## 2017-10-28 DIAGNOSIS — Z7982 Long term (current) use of aspirin: Secondary | ICD-10-CM | POA: Insufficient documentation

## 2017-10-28 DIAGNOSIS — Q2543 Congenital aneurysm of aorta: Secondary | ICD-10-CM | POA: Insufficient documentation

## 2017-10-28 DIAGNOSIS — E785 Hyperlipidemia, unspecified: Secondary | ICD-10-CM | POA: Insufficient documentation

## 2017-10-28 DIAGNOSIS — Z7901 Long term (current) use of anticoagulants: Secondary | ICD-10-CM | POA: Insufficient documentation

## 2017-10-28 DIAGNOSIS — I4891 Unspecified atrial fibrillation: Secondary | ICD-10-CM | POA: Insufficient documentation

## 2017-10-28 DIAGNOSIS — I10 Essential (primary) hypertension: Secondary | ICD-10-CM | POA: Insufficient documentation

## 2017-10-28 DIAGNOSIS — Z9889 Other specified postprocedural states: Secondary | ICD-10-CM | POA: Insufficient documentation

## 2017-10-28 MED ORDER — SPIRONOLACTONE 25 MG PO TABS: 12.5000 mg | ORAL_TABLET | Freq: Every day | ORAL | 3 refills | Status: DC

## 2017-10-28 MED ORDER — SILDENAFIL CITRATE 100 MG PO TABS: 100.0000 mg | ORAL_TABLET | Freq: Every day | ORAL | 0 refills | Status: DC | PRN

## 2017-10-28 NOTE — Patient Instructions (Signed)
Start Spironolactone 12.5 mg (1/2 tab) daily  Start Viagra 100 mg, try taking 1/2 tab at first, if that does not work can take the full tab  Lab in 1 week  Your physician recommends that you schedule a follow-up appointment in: 2-3 weeks with Fara ChuteErika, Pharm D  Your physician recommends that you schedule a follow-up appointment in: 4 months with echocardiogram

## 2017-10-28 NOTE — Progress Notes (Signed)
Patient ID: Mark [name], male   DOB: 20-Jun-1961, 57 y.o.   MRN: [id number]    Advanced Heart Failure Clinic Note   PCP: Mark [name] HF: Dr. Gala [name]   HPI:  Mark StableBill is a very pleasant 57 year old male with a history of bicuspid aortic valve complicated by endocarditis.  He is status post St. Jude aortic valve replacement in 1983.  He also has a history of hyperlipidemia and PAF s/p DC-CV in 9/11. OSA noncompliant with CPAP.   Over the past few years year, he has had some progression in the gradients across his valve.  He underwent cardiac catheterization in May 2009 which showed normal coronary arteries.  We did not cross the valve, obviously this is a mechanical valve.  However, the valve leaflets were seen to be opening well on fluoroscopy.  He did undergo a TEE.  While there was some turbulence around the valve, the leaflets seem to be moving well.  There was no obvious pannus formation.  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 He was seen by Dr. [name] who agreed  with continue watchul waiting. His last echo was 1/14 EF 55-60% [name] gradient across AVR at 23mmHG  Last saw Dr. 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  Has struggled with AF/palpitations.  Has seen Dr. [name] and had two ablations in 8/13 and 3/14. Now has Linq monitor in (placed 04/06/17). Last device interrogation on 10/26/17: 5 AF episodes (6% total) - longest episode 17 hours 32 mins.   He returns for follow up. Doing fairly well. Says he is able to tolerate episodes of AF fairly well as long as he takes cardizem. Was talking to Dr. [name] about possible repeat ablation but may hold off for now. Denies CP, edema, orthopnea or PND. [name] DOE. BP at home 160-170. HR 50-60.  No bleeding with warfarin. No dizziness. Sees Dr. [name] in April for repeat CT scan. Still trying to use CPAP - rips it off  in middle of night. Now looking into dental appliance   Echo 7/18  EF 55-60% AoV Mean gradient (S): 26 mm Hg. Peak gradient (S): 51 mm Hg. AVA 1.35 cm2  Echo 12/14/2014 LVEF 55-60%, Aortic valve gradient 44 to 60 mm Hg, increased from 22 to 35 mm Hg. Echo 4/17 EF 60-65%  AoV Mean gradient (S): 34 mm Hg. Peak gradient (S): 63 mm Hg.     ROS: All systems negative except as listed in HPI, PMH and Problem List.  Past Medical History:  Diagnosis Date  . Aortic stenosis    a. due to bicuspid AoV; 1983 S/p mechanical AVR (St. Jude) - chronic coumadin with INR run between 3.5-4.5;  b. 03/2012 Echo: EF 60%, nl wall motion, mod dil LA.  Marland Kitchen. Ascending aortic aneurysm (HCC)   . Atrial tachycardia (HCC)    a. admitted 07/2012  . Coronary artery disease   . ED (erectile dysfunction)   . Gallstones   . H/O cardiac catheterization    a. 12/2007 Cath: nl cors.  . Headache(784.0)   . 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 Hypertension   . OSA (obstructive sleep apnea)    a. noncompliant with CPAP  . Paroxysmal atrial fibrillation (HCC)    s/p afib ablation x 2    Current Outpatient Medications  Medication Sig Dispense Refill  .  aspirin EC 81 MG tablet Take 81 mg by mouth at bedtime.     . carvedilol (COREG) 6.25 MG tablet Take 1 tablet (6.25 mg total) by mouth 2 (two) times daily. 180 tablet 2  . diltiazem (CARDIZEM) 60 MG tablet Take one tablet by mouth every 6 hours as needed for afib 30 tablet 1  . esomeprazole (NEXIUM) 40 MG capsule Take 40 mg by mouth at bedtime.    . gabapentin (NEURONTIN) 100 MG capsule Take 1 capsule (100 mg total) by mouth 3 (three) times daily. 90 capsule 1  . lisinopril (PRINIVIL,ZESTRIL) 40 MG tablet Take 1 tablet (40 mg total) by mouth daily. Please call our office for follow up. Thanks 30 tablet 5  . meclizine (ANTIVERT) 25 MG tablet Take 1 tablet (25 mg total) by mouth 3 (three) times daily as needed for dizziness. 30 tablet 0  . ondansetron (ZOFRAN ODT) 4  MG disintegrating tablet Take 1 tablet (4 mg total) by mouth every 8 (eight) hours as needed for nausea or vomiting. 20 tablet 0  . scopolamine (TRANSDERM-SCOP, 1.5 MG,) 1 MG/3DAYS Place 1 patch (1.5 mg total) onto the skin every 3 (three) days. 4 patch 0  . simvastatin (ZOCOR) 20 MG tablet TAKE 1 TABLET BY MOUTH EVERYDAY AT BEDTIME 90 tablet 0  . valACYclovir (VALTREX) 1000 MG tablet Take 1 tablet (1,000 mg total) by mouth 3 (three) times daily. 21 tablet 0  . verapamil (VERELAN PM) 120 MG 24 hr capsule Take 1 capsule (120 mg total) by mouth at bedtime. 90 capsule 3  . warfarin (COUMADIN) 10 MG tablet TAKE AS DIRECTED BY COUMADIN CLINIC 40 tablet 1   No current facility-administered medications for this encounter.     PHYSICAL EXAM: Vitals:   10/28/17 1154  BP: 140/78  Pulse: 62  SpO2: 99%  Weight: 240 lb (108.9 kg)   Wt Readings from Last 3 Encounters:  10/01/17 243 lb 2 oz (110.3 kg)  09/29/17 239 lb (108.4 kg)  09/22/17 244 lb 8 oz (110.9 kg)    General:  Well appearing. No resp difficulty HEENT: normal Neck: supple. no JVD. Carotids 2+ bilat; no bruits. No lymphadenopathy or thryomegaly appreciated. Cor: PMI nondisplaced. Regular rate & rhythm. 2/6 SEM  Mechnical s2 Lungs: clear Abdomen: soft, nontender, nondistended. No hepatosplenomegaly. No bruits or masses. Good bowel sounds. Extremities: no cyanosis, clubbing, rash, edema Neuro: alert & orientedx3, cranial nerves grossly intact. moves all 4 extremities w/o difficulty. Affect pleasant    ASSESSMENT & PLAN: 1. Aortic stenosis s/p AVR - Echo 03/02/17 EF normal Mean AoV gradient 26.  Valve sounds good on exam. - Emphasized use abx for SBE prophylaxis - Continue coumadin and ECASA 81 - Will get f/u echo at next visit 2. HTN - Blood pressure still elevated. Continue BP log - Start spiro 12.5 daily. BMET 1 week - Refer to PharmD clinic for med titration. May need amlodipine or doxazosin.  - HR too low to increase  b-blocker - needs to get OSA under control  3. Aortic root aneurysm - Followed by Dr. [name]. Repeat scan 4/19 - Needs better BR control. HR ok  4. Lipids - Followed by Dr. Beverely Low. LDL remains high (121). Now simva. Unable to tolerate lipirtor or crestor. Consider adding Zetia or PCSK-9 inhibitor 5. Atrial fibrillation - Followed by Dr. Johney Frame. Seems to be tolerating ok at this point.  6. ED  - Try viagra. Warned about never taking NTG with Viagra    Reuel Boom Payal Stanforth,MD 11:48  AM    

## 2017-10-28 NOTE — Addendum Note (Signed)
Encounter addended by: Noralee SpaceSchub, Shlome Baldree M, RN on: 10/28/2017 12:27 PM  Actions taken: Visit diagnoses modified, Order list changed, Diagnosis association updated, Sign clinical note

## 2017-11-03 ENCOUNTER — Telehealth (INDEPENDENT_AMBULATORY_CARE_PROVIDER_SITE_OTHER): Payer: Self-pay | Admitting: Orthopaedic Surgery

## 2017-11-03 NOTE — Telephone Encounter (Signed)
thanks

## 2017-11-03 NOTE — Telephone Encounter (Signed)
FYI-update:  I called patient today to discuss a surgery date(left knee mass) as he had originally ask me to reach him in late February or early March.  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  In the meantime, I can get his cardiologist to advise on Coumadin.

## 2017-11-03 NOTE — Telephone Encounter (Signed)
SEE NOTE BELOW

## 2017-11-04 ENCOUNTER — Telehealth (HOSPITAL_COMMUNITY): Payer: Self-pay | Admitting: *Deleted

## 2017-11-04 NOTE — Telephone Encounter (Signed)
Advanced Heart Failure Triage Encounter  Patient Name: Mark [name]  Date of Call: 11/04/17  Problem:  Patient called to report BP earlier today was 198/98, he started feeling bad last night and continued to feel worse today.  Also having dizzy/lightheadeness.  I had patient recheck BP while I was on the phone and it was 176/98, HR 63.  Patient stated he can tell when he is in Afib and he doesn't feel that he is.  He has taken all his medications as prescribed today except he has not start spiro yet as he had been out of town since his last appointment and hasn't picked it up.    Plan:  Will forward to Maxine GlennMichael Tillery, PA for review.   Georgina PeerFarver, Danylah Holden S, RN

## 2017-11-04 NOTE — Telephone Encounter (Signed)
Needs to start spiro as directed and call back with BP log.     Casimiro NeedleMichael [address]"Andy" [place], PA-C 11/04/2017 3:54 PM

## 2017-11-04 NOTE — Telephone Encounter (Signed)
Called patient back and he agrees that he will start Cleda DaubSpiro this evening and will continue to record his BP.  He will call us back if it continues to run high. No further questions.

## 2017-11-05 ENCOUNTER — Ambulatory Visit (INDEPENDENT_AMBULATORY_CARE_PROVIDER_SITE_OTHER): Payer: Self-pay | Admitting: *Deleted

## 2017-11-05 DIAGNOSIS — I480 Paroxysmal atrial fibrillation: Secondary | ICD-10-CM

## 2017-11-05 NOTE — Progress Notes (Signed)
Carelink Summary Report / Loop Recorder 

## 2017-11-08 ENCOUNTER — Other Ambulatory Visit (HOSPITAL_COMMUNITY): Payer: Self-pay

## 2017-11-11 ENCOUNTER — Ambulatory Visit (HOSPITAL_COMMUNITY): Payer: Self-pay

## 2017-11-12 ENCOUNTER — Ambulatory Visit (HOSPITAL_COMMUNITY)
Admission: RE | Admit: 2017-11-12 | Discharge: 2017-11-12 | Disposition: A | Discharge status: Home / Self Care | Payer: Self-pay | Source: Ambulatory Visit | Attending: Internal Medicine | Admitting: Internal Medicine

## 2017-11-12 ENCOUNTER — Ambulatory Visit (INDEPENDENT_AMBULATORY_CARE_PROVIDER_SITE_OTHER): Payer: Self-pay | Admitting: *Deleted

## 2017-11-12 DIAGNOSIS — Z952 Presence of prosthetic heart valve: Secondary | ICD-10-CM

## 2017-11-12 DIAGNOSIS — I4891 Unspecified atrial fibrillation: Secondary | ICD-10-CM | POA: Insufficient documentation

## 2017-11-12 DIAGNOSIS — Z7901 Long term (current) use of anticoagulants: Secondary | ICD-10-CM

## 2017-11-12 LAB — BASIC METABOLIC PANEL
Anion gap: 8 (ref 5–15)
BUN: 13 mg/dL (ref 6–20)
CO2: 24 mmol/L (ref 22–32)
Calcium: 9.1 mg/dL (ref 8.9–10.3)
Chloride: 108 mmol/L (ref 101–111)
Creatinine, Ser: 1.1 mg/dL (ref 0.61–1.24)
GFR calc Af Amer: >60 mL/min (ref >60)
GFR calc non Af Amer: >60 mL/min (ref >60)
Glucose, Bld: 118 mg/dL — ABNORMAL HIGH (ref 65–99)
Potassium: 4.1 mmol/L (ref 3.5–5.1)
Sodium: 140 mmol/L (ref 135–145)

## 2017-11-12 LAB — POCT INR: INR: 3.6

## 2017-11-12 NOTE — Patient Instructions (Signed)
Description   Continue 10 mg daily except 5mg  on Fridays.  Repeat INR in 4 weeks.

## 2017-11-15 ENCOUNTER — Ambulatory Visit (HOSPITAL_COMMUNITY)
Admission: RE | Admit: 2017-11-15 | Discharge: 2017-11-15 | Disposition: A | Discharge status: Home / Self Care | Payer: Self-pay | Source: Ambulatory Visit | Attending: Cardiology | Admitting: Cardiology

## 2017-11-15 DIAGNOSIS — I4891 Unspecified atrial fibrillation: Secondary | ICD-10-CM | POA: Insufficient documentation

## 2017-11-15 DIAGNOSIS — I060 Rheumatic aortic stenosis: Secondary | ICD-10-CM | POA: Insufficient documentation

## 2017-11-15 DIAGNOSIS — Z9889 Other specified postprocedural states: Secondary | ICD-10-CM | POA: Insufficient documentation

## 2017-11-15 DIAGNOSIS — Z79899 Other long term (current) drug therapy: Secondary | ICD-10-CM | POA: Insufficient documentation

## 2017-11-15 DIAGNOSIS — Q2543 Congenital aneurysm of aorta: Secondary | ICD-10-CM | POA: Insufficient documentation

## 2017-11-15 DIAGNOSIS — I10 Essential (primary) hypertension: Secondary | ICD-10-CM | POA: Insufficient documentation

## 2017-11-15 DIAGNOSIS — N529 Male erectile dysfunction, unspecified: Secondary | ICD-10-CM | POA: Insufficient documentation

## 2017-11-15 MED ORDER — SILDENAFIL CITRATE 50 MG PO TABS: 50.0000 mg | ORAL_TABLET | Freq: Every day | ORAL | 0 refills | Status: DC | PRN

## 2017-11-15 MED ORDER — SPIRONOLACTONE 25 MG PO TABS: 25.0000 mg | ORAL_TABLET | Freq: Every day | ORAL | 5 refills | Status: DC

## 2017-11-15 NOTE — Patient Instructions (Signed)
It was great to meet you today!  INCREASE your spironolactone to 25mg  (1 tablet) once daily.  Return in 2 weeks for blood work and to see the pharmacist, Cicero DuckErika.

## 2017-11-15 NOTE — Progress Notes (Signed)
HF MD: BENSIMHON   HPI:  57 year old male Caucasian with a history of bicuspid aortic valve complicated by endocarditis. He is status post St. Jude mechanical aortic valve replacement in 1983.He also has a history of HLD and PAF s/p DC-CV in 9/11. Has had 2 ablations in 8/13 and 3/14, Linq monitor placed 04/06/17. Last device interrogation 10/26/17 - 5 AF episodes (6% total) - Longest episode 17hrs 32min. OSA noncompliant with CPAP, rips it off in the middle of the night.  Patient called 11/04/17 to report BP 198/98 feeling bad with lightheadedness/dizziness, upon recheck while on the phone BP was 176/98 HR 63, encouraged to pick up and start spironolactone 12.5mg  daily as prescribed by Dr. Gala RomneyBensimhon on 10/28/17.   Patient here today for pharmacist-led management of hypertension. He reports great compliance with his regimen but does admit to missing 1-2 doses per week. Reports having several episodes of vertigo over the past 2 weeks. 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 Was walking 3 miles/day but has stopped with cold/rainy weather over the past several weeks.    Shortness of breath/dyspnea on exertion? no   Orthopnea/PND? Yes, uses 6 pillows/night but this is baseline  Edema? no  Lightheadedness/dizziness? Yes, due to vertigo   Daily weights at home? Yes, stable 241 lb at home  Blood pressure/heart rate monitoring at home? Yes, twice daily 112-188/73-98 mmHg, HR 51-70 (88 3/15 Afib)  Following low-sodium/fluid-restricted diet? Yes, does the [facility] Diet, fish and chicken, no fried, fresh veggies/fruit  Caffeine: Very little caffeine in diet - no soda, decaf tea and coffee  Exercise: Was walking 3 miles daily until the winter, plans to increase this as the weather improves  HF Medications: Carvedilol 6.25mg  BID Lisinopril 40mg  QD Spironolactone 12.5mg  QD Afib - Diltiazem 60mg  Q6hr PRN Afib Verapamil 120mg  QHS  Has the patient been experiencing any side effects to the  medications prescribed?  Yes, thinks he started getting headaches when carvedilol was started. Takes Advil PRN, advised to take Tylenol instead  Does the patient have any problems obtaining medications due to transportation or finances?   No issues with most medications, but Viagra prescription was expensive  Understanding of regimen: excellent Understanding of indications: excellent Potential of compliance: excellent Patient understands to avoid NSAIDs. Patient understands to avoid decongestants.   Pertinent Lab Values:  11/12/17: Serum creatinine 1.10, Potassium 4.1  09/22/17: Serum creatinine 1.02, Potassium 4.0  Vital Signs:  Weight: 244.2 lb   Blood pressure: 148/92   Heart rate: 60  10/28/17: BP 140/78, HR 62, Wt 240lb  Assessment: 1. HTN - Blood pressurestill elevated above goal <130/80 mmHg. Continue daily BP log - HR too low to increase B-blocker - Needs to get OSA under control - Will increase spironolactone to 25mg  daily  2. Aortic stenosis s/p AVR - ECHO 1/14 EF 55-60%, stable gradient across AVR at 23 mmHg - Echo 03/02/17 EF normal, Mean AoV gradient 26. - Knows to use abx for SBE prophylaxis - Continue Coumadin and EC ASA 81mg  - Will get f/u echo at next visit  3. Aortic root aneurysm - Followed by Dr. [name].Repeat scan 4/19 - Needs better BP control. HR ok  4. Lipids - Followed by Dr. Beverely Lowabori. LDL improved to 98  - Unable to tolerate Lipitor or Crestor. Currently taking simvastatin 20mg  daily.  5. Atrial fibrillation - Followed by Dr. [name].Seems to be tolerating Afib episodes ok at this point as long as he takes diltiazem.  6. ED - Viagra added at last  visit on 2/28. Warned to never take NTG with Viagra -Stated that Viagra was very expensive at the pharmacy, sent in generic sildenafil  Plan: 1) Medication changes: Based on clinical presentation, vital signs and recent labs will increase spironolactone to 25mg  daily and send in  generic sildenafil. 2) Labs: BMET at f/u on 4/1 3) Follow-up: Pharmacy 4/1. Pacer check with Dr. Johney Frame on 4/10 and with Dr. Gala Romney on 02/24/18   Tyler Deis. Bonnye Fava, PharmD, BCPS, CPP Clinical Pharmacist Pager: [phone] Phone: [phone] 11/11/2017 10:03 AM

## 2017-11-29 ENCOUNTER — Ambulatory Visit (HOSPITAL_COMMUNITY)
Admission: RE | Admit: 2017-11-29 | Discharge: 2017-11-29 | Disposition: A | Discharge status: Home / Self Care | Payer: Self-pay | Source: Ambulatory Visit | Attending: Cardiology | Admitting: Cardiology

## 2017-11-29 VITALS — BP 116/82 | HR 55 | Wt 247.0 lb

## 2017-11-29 DIAGNOSIS — I4891 Unspecified atrial fibrillation: Secondary | ICD-10-CM | POA: Insufficient documentation

## 2017-11-29 DIAGNOSIS — I480 Paroxysmal atrial fibrillation: Secondary | ICD-10-CM | POA: Insufficient documentation

## 2017-11-29 DIAGNOSIS — G4733 Obstructive sleep apnea (adult) (pediatric): Secondary | ICD-10-CM | POA: Insufficient documentation

## 2017-11-29 DIAGNOSIS — I719 Aortic aneurysm of unspecified site, without rupture: Secondary | ICD-10-CM | POA: Insufficient documentation

## 2017-11-29 DIAGNOSIS — Z952 Presence of prosthetic heart valve: Secondary | ICD-10-CM | POA: Insufficient documentation

## 2017-11-29 DIAGNOSIS — I350 Nonrheumatic aortic (valve) stenosis: Secondary | ICD-10-CM | POA: Insufficient documentation

## 2017-11-29 DIAGNOSIS — N529 Male erectile dysfunction, unspecified: Secondary | ICD-10-CM | POA: Insufficient documentation

## 2017-11-29 DIAGNOSIS — E785 Hyperlipidemia, unspecified: Secondary | ICD-10-CM | POA: Insufficient documentation

## 2017-11-29 DIAGNOSIS — I10 Essential (primary) hypertension: Secondary | ICD-10-CM | POA: Insufficient documentation

## 2017-11-29 LAB — BASIC METABOLIC PANEL
Anion gap: 8 (ref 5–15)
BUN: 16 mg/dL (ref 6–20)
CO2: 21 mmol/L — ABNORMAL LOW (ref 22–32)
Calcium: 9 mg/dL (ref 8.9–10.3)
Chloride: 106 mmol/L (ref 101–111)
Creatinine, Ser: 0.96 mg/dL (ref 0.61–1.24)
GFR calc Af Amer: >60 mL/min (ref >60)
GFR calc non Af Amer: >60 mL/min (ref >60)
Glucose, Bld: 97 mg/dL (ref 65–99)
Potassium: 4.5 mmol/L (ref 3.5–5.1)
Sodium: 135 mmol/L (ref 135–145)

## 2017-11-29 NOTE — Patient Instructions (Addendum)
It was great to meet you.  Continue carvedilol 6.25mg  twice daily, lisinopril 40mg  daily, spironolactone 25mg  daily, verapamil 120mg  daily, and diltiazem 60mg  as needed.  Return to clinic on 12/27/17 at 9:15am for labs, follow-up with Dr. Gala RomneyBensimhon on 02/24/18.

## 2017-11-29 NOTE — Progress Notes (Addendum)
HF MD: BENSIMHON  HPI: 57 year old maleCaucasianwith a history of bicuspid aortic valve complicated by endocarditis. He is s/p St. Judemechanicalaortic valve replacement in 1983.He also has a history ofHLDand PAF s/p DC-CV in 9/11. Has had 2 ablations in 8/13 and 3/14, Linq monitor placed 04/06/17. Last device interrogation 10/26/17 - 5 AF episodes (6% total) - Longest episode 17hrs 32min.OSA noncompliant with CPAP, rips it off in the middle of the night. Last ECHO 02/2017 showed EF 55-60% with normal wall motion.  Patient called 11/04/17 to report BP 198/98 feeling bad with lightheadedness/dizziness, upon recheck while on the phone BP was 176/98 HR 63, encouraged to pick up and start spironolactone 12.5mg  daily as prescribed by Dr. Gala RomneyBensimhon on 10/28/17.Pt presented for pharmacist-led HTN clinic visit on 11/15/17 where spironolactone was increased to 25mg  daily.  Patient here today for pharmacist-led management of hypertension. Pt has only taken his carvedilol today, all other medications are taken at night but he endorses good compliance. He reports that he has begun walking 1.5 miles daily and is continuing to avoid excess Na and caffeine intake. Pt reports fatigue within last couple weeks that comes on in the afternoons. Home BP readings have been well-controlled in 110-130s mostly and HRs in 50-60s. He noted an episode of AFib this weekend which resolved with use of as needed diltiazem.    Shortness of breath/dyspnea on exertion? No - some SOB with AFib on Saturday but this is improved now in NSR  Orthopnea/PND? no  Edema? no  Lightheadedness/dizziness? no  Daily weights at home? Yes - usually 242 lbs (boots and jacket on in clinic reading today)  Blood pressure/heart rate monitoring at home? Yes - BP mostly 110-130s/80s, HR mostly 50-60s  Following low-sodium/fluid-restricted diet? yes  HF/AF/HTN Medications: Carvedilol 6.25mg  BID Lisinopril 40mg  daily Spironolactone 25mg   daily Verapamil 120mg  QHS Diltiazem 60mg  q6h prn AFib  Has the patient been experiencing any side effects to the medications prescribed?  no  Does the patient have any problems obtaining medications due to transportation or finances?   no  Understanding of regimen: good Understanding of indications: good Potential of compliance: good Patient understands to avoid NSAIDs. Patient understands to avoid decongestants.   Pertinent Lab Values:  11/29/17: Serum creatinine 0.96 mg/dl, CO2 21 mmol/L, Potassium 4.5 mmol/L, Sodium 135 mmol/L  Vital Signs:  Weight: 247 lbs (dry weight: 242 lbs - pt had boots/jacket on at clinic visit)  Blood pressure: 116/82 mmHg   Heart rate: 55 bpm   11/15/17: BP 148/92, HR 60, Wt 244.2 lbs  Assessment: 1. HTN - Blood pressurewell-controlled in clinic and per home readings.  - Continue spironolactone 25mg /d, Coreg 6.25mg  BID, verapamil 120mg  QHS, and lisinopril 40mg /d. - BMET today and in 1 month with spironolactone - Recent fatigue likely due to tight BP control in setting of prior non-compliance and uncontrolled HTN. Discussed with pt - should gradually improve over time. If fatigue continues consider reducing Coreg to 3.125mg  BID. - ContinuedailyBP log -Needs to get OSA under control  2. Aortic stenosis s/p AVR -ECHO 1/14 EF 55-60%, stable gradient across AVR at 23 mmHg -Echo 03/02/17 EF normal,Mean AoV gradient 26. -Knows touse abx for SBE prophylaxis - ContinueCoumadin and EC ASA 81mg  - Will get f/u echo at next visit  3. Aortic root aneurysm - Followed by Dr. [name].Repeat scan 4/19 - BP and HR now better controlled.  4. Lipids - Followed by Dr. Beverely Lowabori. LDLimproved to 98 -Unable to tolerateLipitor orCrestor.Currently taking simvastatin 20mg  daily.  5. Atrial fibrillation -  Followed by Dr. Johney Frame.Seems to be toleratingAfib episodesok at this pointas long as he takes diltiazem. - Pt reports using diltiazem  for rate control on Saturday, HR 55 today. Pt denies recurrence of symptoms.  6. ED -Viagraadded at last MD visit on 2/28. Warnedto never takeNTG with Viagra - At Rx visit 3/18 pt stated Viagra copay was expensive, prescription for generic sildenafil sent in and pt was able to afford.   Plan: 1) Medication changes: Based on clinical presentation, vital signs and recent labs will continue current antihypertensive regimen noted above. 2) Labs: BMET today and in 1 month 3) Follow-up: Labs in 1 month, F/U with MD 02/24/18   Fredonia Highland, PharmD, BCPS PGY-2 Cardiology Pharmacy Resident Pager: [phone]   Tyler Deis. Bonnye Fava, PharmD, BCPS, CPP Clinical Pharmacist Pager: [phone] Phone: [phone] 11/28/2017 9:50 AM

## 2017-11-30 ENCOUNTER — Other Ambulatory Visit: Payer: Self-pay | Admitting: Family Medicine

## 2017-12-08 ENCOUNTER — Encounter: Payer: Self-pay | Admitting: Internal Medicine

## 2017-12-08 ENCOUNTER — Ambulatory Visit (INDEPENDENT_AMBULATORY_CARE_PROVIDER_SITE_OTHER): Payer: Self-pay | Admitting: *Deleted

## 2017-12-08 ENCOUNTER — Ambulatory Visit (INDEPENDENT_AMBULATORY_CARE_PROVIDER_SITE_OTHER): Payer: Self-pay | Admitting: Internal Medicine

## 2017-12-08 VITALS — BP 124/70 | HR 57 | Ht 71.0 in | Wt 243.0 lb

## 2017-12-08 DIAGNOSIS — Z952 Presence of prosthetic heart valve: Secondary | ICD-10-CM

## 2017-12-08 DIAGNOSIS — R001 Bradycardia, unspecified: Secondary | ICD-10-CM

## 2017-12-08 DIAGNOSIS — I4891 Unspecified atrial fibrillation: Secondary | ICD-10-CM

## 2017-12-08 DIAGNOSIS — I480 Paroxysmal atrial fibrillation: Secondary | ICD-10-CM

## 2017-12-08 DIAGNOSIS — Z7901 Long term (current) use of anticoagulants: Secondary | ICD-10-CM

## 2017-12-08 LAB — POCT INR: INR: 4.5

## 2017-12-08 NOTE — Patient Instructions (Addendum)
Medication Instructions:  Your physician recommends that you continue on your current medications as directed. Please refer to the Current Medication list given to you today.   Labwork: None ordered   Testing/Procedures: None ordered   Follow-Up: Your physician wants you to follow-up in: 6 months with Dr. [name]    Any Other Special Instructions Will Be Listed Below (If Applicable).     If you need a refill on your cardiac medications before your next appointment, please call your pharmacy.   
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
## 2017-12-08 NOTE — Patient Instructions (Addendum)
  Description   Continue 10 mg daily except 5mg  on Fridays.  Repeat INR in 4 weeks.

## 2017-12-09 LAB — CUP PACEART INCLINIC DEVICE CHECK
Date Time Interrogation Session: 20190410105949
Implantable Pulse Generator Implant Date: 20180807

## 2017-12-09 NOTE — Progress Notes (Signed)
Carelink Summary Report / Loop Recorder 

## 2017-12-16 LAB — CUP PACEART REMOTE DEVICE CHECK
Date Time Interrogation Session: 20190308153619
Implantable Pulse Generator Implant Date: 20180807

## 2017-12-21 ENCOUNTER — Other Ambulatory Visit: Payer: Self-pay | Admitting: Thoracic Surgery (Cardiothoracic Vascular Surgery)

## 2017-12-21 DIAGNOSIS — I7121 Aneurysm of the ascending aorta, without rupture: Secondary | ICD-10-CM

## 2017-12-21 DIAGNOSIS — I712 Thoracic aortic aneurysm, without rupture, unspecified: Secondary | ICD-10-CM

## 2017-12-27 ENCOUNTER — Ambulatory Visit (HOSPITAL_COMMUNITY)
Admission: RE | Admit: 2017-12-27 | Discharge: 2017-12-27 | Disposition: A | Discharge status: Home / Self Care | Payer: Self-pay | Source: Ambulatory Visit | Attending: Cardiology | Admitting: Cardiology

## 2017-12-27 DIAGNOSIS — I10 Essential (primary) hypertension: Secondary | ICD-10-CM | POA: Insufficient documentation

## 2017-12-27 LAB — BASIC METABOLIC PANEL
Anion gap: 8 (ref 5–15)
BUN: 18 mg/dL (ref 6–20)
CO2: 26 mmol/L (ref 22–32)
Calcium: 9.1 mg/dL (ref 8.9–10.3)
Chloride: 104 mmol/L (ref 101–111)
Creatinine, Ser: 1.05 mg/dL (ref 0.61–1.24)
GFR calc Af Amer: >60 mL/min (ref >60)
GFR calc non Af Amer: >60 mL/min (ref >60)
Glucose, Bld: 139 mg/dL — ABNORMAL HIGH (ref 65–99)
Potassium: 4.1 mmol/L (ref 3.5–5.1)
Sodium: 138 mmol/L (ref 135–145)

## 2018-01-10 ENCOUNTER — Ambulatory Visit (INDEPENDENT_AMBULATORY_CARE_PROVIDER_SITE_OTHER): Payer: Self-pay | Admitting: *Deleted

## 2018-01-10 DIAGNOSIS — I4891 Unspecified atrial fibrillation: Secondary | ICD-10-CM

## 2018-01-10 LAB — CUP PACEART REMOTE DEVICE CHECK
Date Time Interrogation Session: 20190410154200
Implantable Pulse Generator Implant Date: 20180807

## 2018-01-10 NOTE — Progress Notes (Signed)
Carelink Summary Report / Loop Recorder 

## 2018-01-17 ENCOUNTER — Inpatient Hospital Stay: Admission: RE | Admit: 2018-01-17 | Payer: Self-pay | Source: Ambulatory Visit

## 2018-01-17 ENCOUNTER — Ambulatory Visit: Payer: Self-pay | Admitting: Thoracic Surgery (Cardiothoracic Vascular Surgery)
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
## 2018-02-02 ENCOUNTER — Ambulatory Visit (INDEPENDENT_AMBULATORY_CARE_PROVIDER_SITE_OTHER): Payer: Self-pay | Admitting: *Deleted

## 2018-02-02 DIAGNOSIS — I4891 Unspecified atrial fibrillation: Secondary | ICD-10-CM

## 2018-02-02 DIAGNOSIS — Z7901 Long term (current) use of anticoagulants: Secondary | ICD-10-CM

## 2018-02-02 DIAGNOSIS — Z952 Presence of prosthetic heart valve: Secondary | ICD-10-CM

## 2018-02-02 LAB — PROTIME-INR
INR: 6.1 (ref 0.8–1.2)
Prothrombin Time: 64.4 s — ABNORMAL HIGH (ref 9.1–12.0)

## 2018-02-02 LAB — CUP PACEART REMOTE DEVICE CHECK
Date Time Interrogation Session: 20190513161011
Implantable Pulse Generator Implant Date: 20180807

## 2018-02-02 LAB — POCT INR: INR: 7.6 — AB (ref 2.0–3.0)

## 2018-02-14 ENCOUNTER — Encounter: Payer: Self-pay | Admitting: *Deleted

## 2018-02-17 ENCOUNTER — Telehealth: Payer: Self-pay | Admitting: Cardiology

## 2018-02-17 NOTE — Telephone Encounter (Signed)
LMOVM requesting that pt send manual transmission b/c home monitor has not updated in at least 14 days.    

## 2018-02-24 ENCOUNTER — Ambulatory Visit (HOSPITAL_COMMUNITY)
Admission: RE | Admit: 2018-02-24 | Discharge: 2018-02-24 | Disposition: A | Discharge status: Home / Self Care | Payer: Self-pay | Source: Ambulatory Visit | Attending: Internal Medicine | Admitting: Internal Medicine

## 2018-02-24 ENCOUNTER — Ambulatory Visit (HOSPITAL_COMMUNITY)
Admission: RE | Admit: 2018-02-24 | Discharge: 2018-02-24 | Disposition: A | Discharge status: Home / Self Care | Payer: Self-pay | Source: Ambulatory Visit | Attending: Family Medicine | Admitting: Family Medicine

## 2018-02-24 VITALS — BP 109/73 | HR 53 | Wt 241.0 lb

## 2018-02-24 DIAGNOSIS — Z952 Presence of prosthetic heart valve: Secondary | ICD-10-CM

## 2018-02-24 DIAGNOSIS — I480 Paroxysmal atrial fibrillation: Secondary | ICD-10-CM

## 2018-02-24 DIAGNOSIS — I2510 Atherosclerotic heart disease of native coronary artery without angina pectoris: Secondary | ICD-10-CM | POA: Insufficient documentation

## 2018-02-24 DIAGNOSIS — Q231 Congenital insufficiency of aortic valve: Secondary | ICD-10-CM | POA: Insufficient documentation

## 2018-02-24 DIAGNOSIS — I10 Essential (primary) hypertension: Secondary | ICD-10-CM | POA: Insufficient documentation

## 2018-02-24 DIAGNOSIS — Z7901 Long term (current) use of anticoagulants: Secondary | ICD-10-CM | POA: Insufficient documentation

## 2018-02-24 DIAGNOSIS — E785 Hyperlipidemia, unspecified: Secondary | ICD-10-CM | POA: Insufficient documentation

## 2018-02-24 DIAGNOSIS — N529 Male erectile dysfunction, unspecified: Secondary | ICD-10-CM | POA: Insufficient documentation

## 2018-02-24 DIAGNOSIS — Z79899 Other long term (current) drug therapy: Secondary | ICD-10-CM | POA: Insufficient documentation

## 2018-02-24 DIAGNOSIS — Z9119 Patient's noncompliance with other medical treatment and regimen: Secondary | ICD-10-CM | POA: Insufficient documentation

## 2018-02-24 DIAGNOSIS — G4733 Obstructive sleep apnea (adult) (pediatric): Secondary | ICD-10-CM | POA: Insufficient documentation

## 2018-02-24 DIAGNOSIS — I5032 Chronic diastolic (congestive) heart failure: Secondary | ICD-10-CM

## 2018-02-24 DIAGNOSIS — Q2543 Congenital aneurysm of aorta: Secondary | ICD-10-CM | POA: Insufficient documentation

## 2018-02-24 DIAGNOSIS — I350 Nonrheumatic aortic (valve) stenosis: Secondary | ICD-10-CM

## 2018-02-24 DIAGNOSIS — Z7982 Long term (current) use of aspirin: Secondary | ICD-10-CM | POA: Insufficient documentation

## 2018-02-24 DIAGNOSIS — I712 Thoracic aortic aneurysm, without rupture: Secondary | ICD-10-CM | POA: Insufficient documentation
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
## 2018-02-24 NOTE — Patient Instructions (Signed)
Follow up with Dr. Gala RomneyBensimhon in 6 months.  Please call our office in November to schedule appointment.  [phone].

## 2018-02-24 NOTE — Progress Notes (Signed)
  Echocardiogram 2D Echocardiogram has been performed.  Mark SkeenVijay  [name] 02/24/2018, 9:59 AM

## 2018-02-24 NOTE — Addendum Note (Signed)
Encounter addended by: Georgina PeerFarver, [place] Stegemann S, RN on: 02/24/2018 11:13 AM  Actions taken: Sign clinical note

## 2018-02-27 ENCOUNTER — Other Ambulatory Visit: Payer: Self-pay | Admitting: Family Medicine

## 2018-02-28 ENCOUNTER — Ambulatory Visit: Payer: Self-pay | Admitting: Thoracic Surgery (Cardiothoracic Vascular Surgery)

## 2018-03-17 ENCOUNTER — Ambulatory Visit (INDEPENDENT_AMBULATORY_CARE_PROVIDER_SITE_OTHER): Payer: Self-pay | Admitting: *Deleted

## 2018-03-17 DIAGNOSIS — I480 Paroxysmal atrial fibrillation: Secondary | ICD-10-CM

## 2018-03-17 NOTE — Progress Notes (Signed)
Carelink Summary Report / Loop Recorder 

## 2018-03-28 ENCOUNTER — Ambulatory Visit (INDEPENDENT_AMBULATORY_CARE_PROVIDER_SITE_OTHER): Payer: Self-pay | Admitting: *Deleted

## 2018-03-28 DIAGNOSIS — Z952 Presence of prosthetic heart valve: Secondary | ICD-10-CM

## 2018-03-28 DIAGNOSIS — Z7901 Long term (current) use of anticoagulants: Secondary | ICD-10-CM

## 2018-03-28 DIAGNOSIS — I4891 Unspecified atrial fibrillation: Secondary | ICD-10-CM

## 2018-03-28 LAB — POCT INR: INR: 4.9 — AB (ref 2.0–3.0)

## 2018-03-28 NOTE — Patient Instructions (Signed)
Description   Today take 5mg  then resume 10 mg daily except 5mg  on Fridays.  Repeat INR in 3 weeks per pt request. Pt aware to seek immediate medical attention for any unusual bleeding or bruising concerns.

## 2018-04-18 ENCOUNTER — Ambulatory Visit (INDEPENDENT_AMBULATORY_CARE_PROVIDER_SITE_OTHER): Payer: Self-pay | Admitting: Pharmacist

## 2018-04-18 DIAGNOSIS — I4891 Unspecified atrial fibrillation: Secondary | ICD-10-CM

## 2018-04-18 DIAGNOSIS — Z7901 Long term (current) use of anticoagulants: Secondary | ICD-10-CM

## 2018-04-18 DIAGNOSIS — Z952 Presence of prosthetic heart valve: Secondary | ICD-10-CM

## 2018-04-18 LAB — POCT INR: INR: 2.3 (ref 2.0–3.0)

## 2018-04-18 MED ORDER — WARFARIN SODIUM 10 MG PO TABS: ORAL_TABLET | ORAL | 1 refills | Status: DC

## 2018-04-18 NOTE — Patient Instructions (Addendum)
Description   Today take 15mg  today and tomrrow,  then continue 10 mg daily except 5mg  on Fridays.  Repeat INR in 2 weeks.

## 2018-04-19 ENCOUNTER — Ambulatory Visit (INDEPENDENT_AMBULATORY_CARE_PROVIDER_SITE_OTHER): Payer: Self-pay | Admitting: *Deleted

## 2018-04-19 DIAGNOSIS — I4891 Unspecified atrial fibrillation: Secondary | ICD-10-CM

## 2018-04-20 NOTE — Progress Notes (Signed)
Carelink Summary Report / Loop Recorder 

## 2018-04-25 ENCOUNTER — Other Ambulatory Visit (HOSPITAL_COMMUNITY): Payer: Self-pay | Admitting: Pharmacist

## 2018-04-25 MED ORDER — LISINOPRIL 40 MG PO TABS: 40.0000 mg | ORAL_TABLET | Freq: Every day | ORAL | 5 refills | Status: DC

## 2018-05-03 ENCOUNTER — Ambulatory Visit (INDEPENDENT_AMBULATORY_CARE_PROVIDER_SITE_OTHER): Payer: Self-pay

## 2018-05-03 DIAGNOSIS — I4891 Unspecified atrial fibrillation: Secondary | ICD-10-CM

## 2018-05-03 DIAGNOSIS — Z7901 Long term (current) use of anticoagulants: Secondary | ICD-10-CM

## 2018-05-03 DIAGNOSIS — Z952 Presence of prosthetic heart valve: Secondary | ICD-10-CM

## 2018-05-03 LAB — POCT INR: INR: 4.2 — AB (ref 2.0–3.0)

## 2018-05-03 NOTE — Patient Instructions (Signed)
Description   Continue on same dosage 10 mg daily except 5mg  on Fridays.  Repeat INR in 3 weeks.
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
## 2018-05-05 LAB — CUP PACEART REMOTE DEVICE CHECK
Date Time Interrogation Session: 20190718173850
Implantable Pulse Generator Implant Date: 20180807

## 2018-05-11 ENCOUNTER — Other Ambulatory Visit: Payer: Self-pay | Admitting: Internal Medicine

## 2018-05-22 ENCOUNTER — Other Ambulatory Visit (HOSPITAL_COMMUNITY): Payer: Self-pay | Admitting: Internal Medicine

## 2018-05-23 ENCOUNTER — Ambulatory Visit (INDEPENDENT_AMBULATORY_CARE_PROVIDER_SITE_OTHER): Payer: Self-pay | Admitting: *Deleted

## 2018-05-23 DIAGNOSIS — I4891 Unspecified atrial fibrillation: Secondary | ICD-10-CM

## 2018-05-23 DIAGNOSIS — R001 Bradycardia, unspecified: Secondary | ICD-10-CM

## 2018-05-23 LAB — CUP PACEART REMOTE DEVICE CHECK
Date Time Interrogation Session: 20190820180742
Implantable Pulse Generator Implant Date: 20180807

## 2018-05-23 NOTE — Progress Notes (Signed)
Carelink Summary Report / Loop Recorder 

## 2018-05-25 ENCOUNTER — Other Ambulatory Visit: Payer: Self-pay | Admitting: Family Medicine

## 2018-05-26 ENCOUNTER — Encounter: Payer: Self-pay | Admitting: Internal Medicine

## 2018-05-29 ENCOUNTER — Other Ambulatory Visit: Payer: Self-pay | Admitting: Internal Medicine

## 2018-05-30 LAB — CUP PACEART REMOTE DEVICE CHECK
Date Time Interrogation Session: 20190922183802
Implantable Pulse Generator Implant Date: 20180807

## 2018-05-31 ENCOUNTER — Other Ambulatory Visit: Payer: Self-pay | Admitting: Internal Medicine

## 2018-06-13 ENCOUNTER — Encounter: Payer: Self-pay | Admitting: Internal Medicine

## 2018-06-13 DIAGNOSIS — R0989 Other specified symptoms and signs involving the circulatory and respiratory systems: Secondary | ICD-10-CM

## 2018-06-14 ENCOUNTER — Encounter: Payer: Self-pay | Admitting: Internal Medicine

## 2018-06-24 ENCOUNTER — Ambulatory Visit (INDEPENDENT_AMBULATORY_CARE_PROVIDER_SITE_OTHER): Payer: Self-pay | Admitting: *Deleted

## 2018-06-24 DIAGNOSIS — I4891 Unspecified atrial fibrillation: Secondary | ICD-10-CM

## 2018-06-25 ENCOUNTER — Other Ambulatory Visit: Payer: Self-pay | Admitting: Internal Medicine

## 2018-06-25 NOTE — Progress Notes (Signed)
Carelink Summary Report / Loop Recorder 

## 2018-06-27 NOTE — Telephone Encounter (Signed)
Pt missed last Coumadin appt on 06/13/18, overdue for follow-up.  LMOM TCB for appt, prior to refill.

## 2018-07-08 ENCOUNTER — Ambulatory Visit (INDEPENDENT_AMBULATORY_CARE_PROVIDER_SITE_OTHER): Payer: Self-pay | Admitting: Pharmacist

## 2018-07-08 DIAGNOSIS — Z952 Presence of prosthetic heart valve: Secondary | ICD-10-CM

## 2018-07-08 DIAGNOSIS — Z7901 Long term (current) use of anticoagulants: Secondary | ICD-10-CM

## 2018-07-08 DIAGNOSIS — I4891 Unspecified atrial fibrillation: Secondary | ICD-10-CM

## 2018-07-08 LAB — CUP PACEART REMOTE DEVICE CHECK
Date Time Interrogation Session: 20191025193717
Implantable Pulse Generator Implant Date: 20180807

## 2018-07-08 LAB — POCT INR: INR: 5.3 — AB (ref 2.0–3.0)

## 2018-07-08 NOTE — Patient Instructions (Signed)
Description   Skip your Coumadin today, then continue on same dosage 10 mg daily except 5mg  on Fridays.  Repeat INR in 2-3 weeks.

## 2018-07-27 ENCOUNTER — Ambulatory Visit (INDEPENDENT_AMBULATORY_CARE_PROVIDER_SITE_OTHER): Payer: Self-pay

## 2018-07-27 DIAGNOSIS — I4891 Unspecified atrial fibrillation: Secondary | ICD-10-CM

## 2018-07-27 NOTE — Progress Notes (Signed)
Carelink Summary Report / Loop Recorder 

## 2018-08-10 ENCOUNTER — Telehealth: Payer: Self-pay | Admitting: Internal Medicine

## 2018-08-10 NOTE — Telephone Encounter (Signed)
New Message    1. Has your device fired? no  2. Is you device beeping? no  3. Are you experiencing draining or swelling at device site? no  4. Are you calling to see if we received your device transmission? yes  5. Have you passed out? No   Patient is wanting to be sure that he is transmitting properly because he sometimes feel like he is in Afib. He wants to know if it shows on the transmission    Please route to Device Clinic Pool

## 2018-08-11 NOTE — Telephone Encounter (Signed)
LMOVM for pt to return call. (Need manual transmission from medtronic monitor.)

## 2018-08-11 NOTE — Telephone Encounter (Signed)
Called and requested that patient send in a manual transmission from his home monitor. Patient verbalized understanding and plans to send this evening.

## 2018-08-11 NOTE — Telephone Encounter (Signed)
I left a voicemail letting pt know we did receive his transmission. I gave him our number to call back if he had any additional questions.

## 2018-08-17 NOTE — Telephone Encounter (Signed)
Message left for patient to return call so we could let him know we are receiving his transmissions and his Afib burden is stable.

## 2018-08-17 NOTE — Telephone Encounter (Signed)
Transmission reviewed. AF episodes noted on 12/6, 12/7, and 12/10, longest 9hr 52min. AF burden is 4.0% through 12/18 (stable--burden as of 12/08/17 OV with Dr. [name] was 5.1%). Will make patient aware that we typically call for increased burden or persistent AF, but patient can call our office or AF Clinic if symptomatic.  Patient missed 06/13/18 appointment with Dr. [name] for 10788m f/u. Needs to reschedule, message sent to scheduler.

## 2018-08-18 NOTE — Telephone Encounter (Signed)
Pt called this morning. I told him his A-fib burden was stable according to Teri's note.

## 2018-08-26 ENCOUNTER — Ambulatory Visit: Payer: Self-pay | Admitting: Physician Assistant

## 2018-08-26 ENCOUNTER — Encounter: Payer: Self-pay | Admitting: Physician Assistant

## 2018-08-26 ENCOUNTER — Other Ambulatory Visit: Payer: Self-pay

## 2018-08-26 VITALS — BP 128/78 | HR 54 | Temp 98.2°F | Resp 16 | Ht 71.0 in | Wt 243.0 lb

## 2018-08-26 DIAGNOSIS — Z7901 Long term (current) use of anticoagulants: Secondary | ICD-10-CM

## 2018-08-26 DIAGNOSIS — J208 Acute bronchitis due to other specified organisms: Secondary | ICD-10-CM

## 2018-08-26 DIAGNOSIS — B9689 Other specified bacterial agents as the cause of diseases classified elsewhere: Secondary | ICD-10-CM

## 2018-08-26 LAB — POCT INR: INR: 4.5 — AB (ref 2.0–3.0)

## 2018-08-26 MED ORDER — AMOXICILLIN 875 MG PO TABS: 875.0000 mg | ORAL_TABLET | Freq: Two times a day (BID) | ORAL | 0 refills | Status: DC

## 2018-08-26 MED ORDER — BENZONATATE 100 MG PO CAPS: 100.0000 mg | ORAL_CAPSULE | Freq: Three times a day (TID) | ORAL | 0 refills | Status: DC | PRN

## 2018-08-26 NOTE — Patient Instructions (Signed)
Take antibiotic (Amoxicillin) as directed.  Increase fluids.  Get plenty of rest.  Tessalon for cough along with your Coricidin. Take a daily probiotic (I recommend Align or Culturelle, but even Activia Yogurt may be beneficial).  A humidifier placed in the bedroom may offer some relief for a dry, scratchy throat of nasal irritation.  Schedule a follow-up with the coumadin clinic in 6-7 days for repeat INR.   Read information below on acute bronchitis. Please call or return to clinic if symptoms are not improving.  Acute Bronchitis Bronchitis is when the airways that extend from the windpipe into the lungs get red, puffy, and painful (inflamed). Bronchitis often causes thick spit (mucus) to develop. This leads to a cough. A cough is the most common symptom of bronchitis. In acute bronchitis, the condition usually begins suddenly and goes away over time (usually in 2 weeks). Smoking, allergies, and asthma can make bronchitis worse. Repeated episodes of bronchitis may cause more lung problems.  HOME CARE  Rest.  Drink enough fluids to keep your pee (urine) clear or pale yellow (unless you need to limit fluids as told by your doctor).  Only take over-the-counter or prescription medicines as told by your doctor.  Avoid smoking and secondhand smoke. These can make bronchitis worse. If you are a smoker, think about using nicotine gum or skin patches. Quitting smoking will help your lungs heal faster.  Reduce the chance of getting bronchitis again by:  Washing your hands often.  Avoiding people with cold symptoms.  Trying not to touch your hands to your mouth, nose, or eyes.  Follow up with your doctor as told.  GET HELP IF: Your symptoms do not improve after 1 week of treatment. Symptoms include:  Cough.  Fever.  Coughing up thick spit.  Body aches.  Chest congestion.  Chills.  Shortness of breath.  Sore throat.  GET HELP RIGHT AWAY IF:   You have an increased  fever.  You have chills.  You have severe shortness of breath.  You have bloody thick spit (sputum).  You throw up (vomit) often.  You lose too much body fluid (dehydration).  You have a severe headache.  You faint.  MAKE SURE YOU:   Understand these instructions.  Will watch your condition.  Will get help right away if you are not doing well or get worse. Document Released: 02/03/2008 Document Revised: 04/19/2013 Document Reviewed: 02/07/2013 [facility] Patient Information 2015 [employer] [place]. This information is not intended to replace advice given to you by your health care provider. Make sure you discuss any questions you have with your health care provider.
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
## 2018-08-29 ENCOUNTER — Ambulatory Visit (INDEPENDENT_AMBULATORY_CARE_PROVIDER_SITE_OTHER): Payer: Self-pay

## 2018-08-29 DIAGNOSIS — R001 Bradycardia, unspecified: Secondary | ICD-10-CM

## 2018-08-29 DIAGNOSIS — I4891 Unspecified atrial fibrillation: Secondary | ICD-10-CM

## 2018-08-30 LAB — CUP PACEART REMOTE DEVICE CHECK
Date Time Interrogation Session: 20191230204013
Implantable Pulse Generator Implant Date: 20180807

## 2018-08-30 NOTE — Progress Notes (Signed)
Carelink Summary Report / Loop Recorder 

## 2018-09-05 ENCOUNTER — Other Ambulatory Visit: Payer: Self-pay | Admitting: Family Medicine

## 2018-09-05 ENCOUNTER — Other Ambulatory Visit (HOSPITAL_COMMUNITY): Payer: Self-pay | Admitting: Internal Medicine

## 2018-09-06 ENCOUNTER — Ambulatory Visit (INDEPENDENT_AMBULATORY_CARE_PROVIDER_SITE_OTHER): Payer: Managed Care, Other (non HMO)

## 2018-09-06 DIAGNOSIS — I4891 Unspecified atrial fibrillation: Secondary | ICD-10-CM | POA: Diagnosis not present

## 2018-09-06 DIAGNOSIS — Z7901 Long term (current) use of anticoagulants: Secondary | ICD-10-CM | POA: Diagnosis not present

## 2018-09-06 DIAGNOSIS — Z952 Presence of prosthetic heart valve: Secondary | ICD-10-CM | POA: Diagnosis not present

## 2018-09-06 LAB — POCT INR: INR: 3.5 — AB (ref 2.0–3.0)

## 2018-09-06 NOTE — Patient Instructions (Addendum)
Description   Continue on same dosage 10 mg daily except 5mg  on Fridays.  Repeat INR in 4 weeks.

## 2018-09-11 LAB — CUP PACEART REMOTE DEVICE CHECK
Date Time Interrogation Session: 20191127193952
Implantable Pulse Generator Implant Date: 20180807

## 2018-09-21 ENCOUNTER — Other Ambulatory Visit (HOSPITAL_COMMUNITY): Payer: Self-pay

## 2018-09-21 MED ORDER — VERAPAMIL HCL ER 120 MG PO CP24: 120.0000 mg | ORAL_CAPSULE | Freq: Every day | ORAL | 1 refills | Status: DC

## 2018-09-28 ENCOUNTER — Other Ambulatory Visit: Payer: Self-pay

## 2018-09-28 ENCOUNTER — Ambulatory Visit (INDEPENDENT_AMBULATORY_CARE_PROVIDER_SITE_OTHER): Payer: Managed Care, Other (non HMO) | Admitting: Family Medicine

## 2018-09-28 ENCOUNTER — Encounter: Payer: Self-pay | Admitting: Family Medicine

## 2018-09-28 VITALS — BP 134/82 | HR 73 | Temp 98.2°F | Resp 16 | Ht 71.0 in | Wt 238.0 lb

## 2018-09-28 DIAGNOSIS — E669 Obesity, unspecified: Secondary | ICD-10-CM | POA: Diagnosis not present

## 2018-09-28 DIAGNOSIS — E785 Hyperlipidemia, unspecified: Secondary | ICD-10-CM

## 2018-09-28 DIAGNOSIS — I721 Aneurysm of artery of upper extremity: Secondary | ICD-10-CM | POA: Diagnosis not present

## 2018-09-28 DIAGNOSIS — Z9889 Other specified postprocedural states: Secondary | ICD-10-CM

## 2018-09-28 DIAGNOSIS — Z23 Encounter for immunization: Secondary | ICD-10-CM

## 2018-09-28 DIAGNOSIS — Z125 Encounter for screening for malignant neoplasm of prostate: Secondary | ICD-10-CM
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
## 2018-09-28 NOTE — Patient Instructions (Signed)
Schedule your complete physical in 6 months We'll notify you of your lab results and make any changes if needed We'll call you with your Vascular appt Continue to work on healthy diet and regular exercise- you can do it! Call with any questions or concerns Happy New Year!!!

## 2018-09-28 NOTE — Assessment & Plan Note (Signed)
Ongoing issue.  Stressed need for healthy diet and regular exercise.  Check labs to risk stratify. 

## 2018-09-28 NOTE — Assessment & Plan Note (Signed)
Chronic problem.  Tolerating statin w/o difficulty.  Check labs.  Adjust meds prn  

## 2018-09-28 NOTE — Assessment & Plan Note (Signed)
Deteriorated.  Pt has hx of this and was seen in 2015 for similar.  Area has enlarged and is exquisitely painful.  Needs urgent referral back to Vascular.  Pt expressed understanding and is in agreement w/ plan.

## 2018-09-29 LAB — CBC WITH DIFFERENTIAL/PLATELET
Basophils Absolute: 0.1 10*3/uL (ref 0.0–0.1)
Basophils Relative: 1.2 % (ref 0.0–3.0)
Eosinophils Absolute: 0.2 10*3/uL (ref 0.0–0.7)
Eosinophils Relative: 2.5 % (ref 0.0–5.0)
HCT: 39 % (ref 39.0–52.0)
Hemoglobin: 13.3 g/dL (ref 13.0–17.0)
Lymphocytes Relative: 33.6 % (ref 12.0–46.0)
Lymphs Abs: 2 10*3/uL (ref 0.7–4.0)
MCHC: 34 g/dL (ref 30.0–36.0)
MCV: 90.8 fl (ref 78.0–100.0)
Monocytes Absolute: 0.5 10*3/uL (ref 0.1–1.0)
Monocytes Relative: 8.7 % (ref 3.0–12.0)
Neutro Abs: 3.2 10*3/uL (ref 1.4–7.7)
Neutrophils Relative %: 54 % (ref 43.0–77.0)
Platelets: 179 10*3/uL (ref 150.0–400.0)
RBC: 4.3 Mil/uL (ref 4.22–5.81)
RDW: 13.3 % (ref 11.5–15.5)
WBC: 6 10*3/uL (ref 4.0–10.5)

## 2018-09-29 LAB — BASIC METABOLIC PANEL
BUN: 16 mg/dL (ref 6–23)
CO2: 26 mEq/L (ref 19–32)
Calcium: 9.2 mg/dL (ref 8.4–10.5)
Chloride: 104 mEq/L (ref 96–112)
Creatinine, Ser: 0.98 mg/dL (ref 0.40–1.50)
GFR: 79.32 mL/min (ref >60.00)
Glucose, Bld: 91 mg/dL (ref 70–99)
Potassium: 4.1 mEq/L (ref 3.5–5.1)
Sodium: 137 mEq/L (ref 135–145)

## 2018-09-29 LAB — LIPID PANEL
Cholesterol: 174 mg/dL (ref 0–200)
HDL: 35.5 mg/dL — ABNORMAL LOW (ref >39.00)
LDL Cholesterol: 112 mg/dL — ABNORMAL HIGH (ref 0–99)
NonHDL: 138.94
Total CHOL/HDL Ratio: 5
Triglycerides: 137 mg/dL (ref 0.0–149.0)
VLDL: 27.4 mg/dL (ref 0.0–40.0)

## 2018-09-29 LAB — HEPATIC FUNCTION PANEL
ALT: 34 U/L (ref 0–53)
AST: 28 U/L (ref 0–37)
Albumin: 4.4 g/dL (ref 3.5–5.2)
Alkaline Phosphatase: 53 U/L (ref 39–117)
Bilirubin, Direct: 0.2 mg/dL (ref 0.0–0.3)
Total Bilirubin: 1 mg/dL (ref 0.2–1.2)
Total Protein: 6.7 g/dL (ref 6.0–8.3)

## 2018-09-29 LAB — TSH: TSH: 1.22 u[IU]/mL (ref 0.35–4.50)

## 2018-09-29 LAB — PSA: PSA: 0.58 ng/mL (ref 0.10–4.00)

## 2018-10-03 ENCOUNTER — Other Ambulatory Visit: Payer: Self-pay | Admitting: Family Medicine

## 2018-10-03 ENCOUNTER — Ambulatory Visit: Payer: Managed Care, Other (non HMO) | Admitting: Thoracic Surgery (Cardiothoracic Vascular Surgery)

## 2018-10-03 ENCOUNTER — Encounter: Payer: Self-pay | Admitting: Thoracic Surgery (Cardiothoracic Vascular Surgery)

## 2018-10-03 ENCOUNTER — Ambulatory Visit (INDEPENDENT_AMBULATORY_CARE_PROVIDER_SITE_OTHER): Payer: Managed Care, Other (non HMO)

## 2018-10-03 ENCOUNTER — Encounter: Payer: Self-pay | Admitting: Internal Medicine

## 2018-10-03 ENCOUNTER — Ambulatory Visit
Admission: RE | Admit: 2018-10-03 | Discharge: 2018-10-03 | Disposition: A | Discharge status: Home / Self Care | Payer: Managed Care, Other (non HMO) | Source: Ambulatory Visit | Attending: Thoracic Surgery (Cardiothoracic Vascular Surgery) | Admitting: Thoracic Surgery (Cardiothoracic Vascular Surgery)

## 2018-10-03 ENCOUNTER — Ambulatory Visit: Payer: Managed Care, Other (non HMO)

## 2018-10-03 ENCOUNTER — Ambulatory Visit (INDEPENDENT_AMBULATORY_CARE_PROVIDER_SITE_OTHER): Payer: Managed Care, Other (non HMO) | Admitting: Internal Medicine

## 2018-10-03 VITALS — BP 108/64 | HR 54 | Ht 71.0 in | Wt 234.0 lb

## 2018-10-03 VITALS — BP 123/82 | HR 60 | Resp 20 | Ht 71.0 in | Wt 235.0 lb

## 2018-10-03 DIAGNOSIS — I712 Thoracic aortic aneurysm, without rupture, unspecified: Secondary | ICD-10-CM

## 2018-10-03 DIAGNOSIS — Z952 Presence of prosthetic heart valve: Secondary | ICD-10-CM

## 2018-10-03 DIAGNOSIS — I480 Paroxysmal atrial fibrillation: Secondary | ICD-10-CM

## 2018-10-03 DIAGNOSIS — R001 Bradycardia, unspecified: Secondary | ICD-10-CM

## 2018-10-03 DIAGNOSIS — I4891 Unspecified atrial fibrillation: Secondary | ICD-10-CM | POA: Diagnosis not present

## 2018-10-03 DIAGNOSIS — Z7901 Long term (current) use of anticoagulants: Secondary | ICD-10-CM

## 2018-10-03 DIAGNOSIS — I7121 Aneurysm of the ascending aorta, without rupture: Secondary | ICD-10-CM

## 2018-10-03 LAB — POCT INR: INR: 1.7 — AB (ref 2.0–3.0)

## 2018-10-03 MED ORDER — IOPAMIDOL (ISOVUE-370) INJECTION 76%
75.0000 mL | Freq: Once | INTRAVENOUS | Status: AC | PRN
  Administered 2018-10-03: 75 mL via INTRAVENOUS

## 2018-10-03 NOTE — Patient Instructions (Addendum)
Medication Instructions:  Your physician recommends that you continue on your current medications as directed. Please refer to the Current Medication list given to you today.  Labwork: None ordered.  Testing/Procedures: None ordered.  Follow-Up: Your physician wants you to follow-up in: 6 months with the AFIB clinic.   You will receive a reminder letter in the mail two months in advance. If you don't receive a letter, please call our office to schedule the follow-up appointment.  Monthly remote monitoring  Any Other Special Instructions Will Be Listed Below (If Applicable).  If you need a refill on your cardiac medications before your next appointment, please call your pharmacy.
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
## 2018-10-03 NOTE — Progress Notes (Signed)
[address]       [name] [address]             [phone]     CARDIOTHORACIC SURGERY OFFICE NOTE  Referring Provider is Bensimhon, Bevelyn Buckles, MD PCP is Sheliah Hatch, MD   HPI:  Patient is a 58 year old male with history of ascending thoracic aortic aneurysm status post aortic valve replacement using a mechanical prosthetic valve for bicuspid aortic valve disease in 1983, hypertension, and atrial fibrillation who returns to the office today for annual follow-up and surveillance of moderate aneurysmal enlargement of his ascending thoracic aorta.  He was last seen in our office on Jan 04, 2017.  He has been followed carefully by Dr. Gala Romney saw him most recently February 24, 2018.  Follow-up echocardiograms have demonstrated mildly elevated gradients across his mechanical prosthetic valve, although TEE revealed no sign of pannus formation or other findings to suggest prosthetic valve dysfunction.  Most recent echocardiogram performed February 24, 2018 revealed stable function with peak velocity across the aortic valve measured 3.5 m/s corresponding to mean transvalvular gradient estimated 27 mmHg.  He returns to our office today and reports that he is doing well.  He specifically denies any significant symptoms of exertional shortness of breath.  He admits that he does not exercise on a regular basis.  He denies any history of chest pain or chest tightness either with activity or at rest.  He states that his blood pressure has remained under good control.  He has not been having trouble with long-term anticoagulation using warfarin.   Current Outpatient Medications  Medication Sig Dispense Refill  . aspirin EC 81 MG tablet Take 81 mg by mouth at bedtime.     . carvedilol (COREG) 6.25 MG tablet Take 1 tablet (6.25 mg total) by mouth 2 (two) times daily. 180 tablet 2  . diltiazem (CARDIZEM) 60 MG tablet Take one tablet by mouth every 6 hours as needed for afib 30 tablet 1    . esomeprazole (NEXIUM) 40 MG capsule Take 40 mg by mouth at bedtime.    Marland Kitchen lisinopril (PRINIVIL,ZESTRIL) 40 MG tablet TAKE 1 TABLET BY MOUTH EVERY DAY 90 tablet 1  . meclizine (ANTIVERT) 25 MG tablet Take 1 tablet (25 mg total) by mouth 3 (three) times daily as needed for dizziness. 30 tablet 0  . sildenafil (VIAGRA) 50 MG tablet Take 1 tablet (50 mg total) by mouth daily as needed for erectile dysfunction. 10 tablet 0  . simvastatin (ZOCOR) 20 MG tablet TAKE 1 TABLET BY MOUTH EVERYDAY AT BEDTIME. 90 tablet 0  . spironolactone (ALDACTONE) 25 MG tablet TAKE 1 TABLET BY MOUTH EVERY DAY 90 tablet 1  . verapamil (VERELAN PM) 120 MG 24 hr capsule Take 1 capsule (120 mg total) by mouth at bedtime. 90 capsule 1  . warfarin (COUMADIN) 10 MG tablet TAKE AS DIRECTED BY COUMADIN CLINIC 40 tablet 1   No current facility-administered medications for this visit.       Physical Exam:   BP 123/82   Pulse 60   Resp 20   Ht 5\' 11"  (1.803 m)   Wt 235 lb (106.6 kg)   SpO2 97% Comment: RA  BMI 32.78 kg/m   General:  Well-appearing  Chest:   Clear to auscultation  CV:   Regular rate and rhythm with mechanical heart sounds  Incisions:  n/a  Abdomen:  Soft nontender  Extremities:  Warm and well-perfused  Diagnostic Tests:  Transthoracic Echocardiography  (Report amended )  Patient:    Mark [name], Mark [name] MR #:       [id number] Study Date: 02/24/2018 Gender:     M Age:        58 Height:     180.3 cm Weight:     110.2 kg BSA:        2.39 m^2 Pt. Status: Room:   ATTENDING    Sheliah Hatch  REFERRING    Sheliah Hatch  ORDERING     Bensimhon, Daniel  REFERRING    Bensimhon, Daniel  PERFORMING   [facility], Outpatient  SONOGRAPHER  Celene Skeen, RDCS  cc:  ------------------------------------------------------------------- LV EF: 60% -   65%  ------------------------------------------------------------------- History:   PMH:  Hx of Bicuspid AOV. Endocarditis. A-Tach. [facility]  [facility] AOV Replacement [phone]) Aortic Valve Replacement Evaluation.  Atrial fibrillation.  Coronary artery disease.  ------------------------------------------------------------------- Study Conclusions  - Left ventricle: The cavity size was normal. Wall thickness was   increased in a pattern of mild LVH. Systolic function was normal.   The estimated ejection fraction was in the range of 60% to 65%. - Aortic valve: AV prosthesis is difficult to see Peak and mean   gradients through the valvea er 69 and 30 mm Hg respectively. (No   signiicant change from gradients in July 2018 when gradients were   50 and 26 mm Hg)  ------------------------------------------------------------------- Study data:  Comparison was made to the study of 03/02/2017.  Study status:  Routine.  Procedure:  The patient reported no pain pre or post test. Transthoracic echocardiography. Image quality was adequate.  Study completion:  There were no complications. Transthoracic echocardiography.  M-mode, complete 2D, spectral Doppler, and color Doppler.  Birthdate:  Patient birthdate: 1961-08-24.  Age:  Patient is 58 yr old.  Sex:  Gender: male. BMI: 33.9 kg/m^2.  Blood pressure:     100/63  Patient status: Inpatient.  Study date:  Study date: 02/24/2018. Study time: 09:11 AM.  Location:  Echo laboratory.  -------------------------------------------------------------------  ------------------------------------------------------------------- Left ventricle:  Global longitudinal strain is -22.6% The cavity size was normal. Wall thickness was increased in a pattern of mild LVH. Systolic function was normal. The estimated ejection fraction was in the range of 60% to 65%.  ------------------------------------------------------------------- Aortic valve:  AV prosthesis is difficult to see Peak and mean gradients through the valvea er 69 and 30 mm Hg respectively. (No signiicant change from gradients in July 2018  when gradients were 50 and 26 mm Hg)  Doppler:  There was no regurgitation.    VTI ratio of LVOT to aortic valve: 0.4. Valve area (VTI): 1.4 cm^2. Indexed valve area (VTI): 0.59 cm^2/m^2. Peak velocity ratio of LVOT to aortic valve: 0.35. Valve area (Vmax): 1.2 cm^2. Indexed valve area (Vmax): 0.5 cm^2/m^2. Mean velocity ratio of LVOT to aortic valve: 0.35. Valve area (Vmean): 1.23 cm^2. Indexed valve area (Vmean): 0.51 cm^2/m^2.    Mean gradient (S): 27 mm Hg. Peak gradient (S): 49 mm Hg.  ------------------------------------------------------------------- Mitral valve:   Structurally normal valve.   Leaflet separation was normal.  Doppler:  Transvalvular velocity was within the normal range. There was no evidence for stenosis. There was trivial regurgitation.    Peak gradient (D): 3 mm Hg.  ------------------------------------------------------------------- Left atrium:  The atrium was normal in size.  ------------------------------------------------------------------- Right ventricle:  The cavity size was normal. Wall thickness was normal. Systolic function was normal.  ------------------------------------------------------------------- Tricuspid valve:   Structurally normal valve.   Leaflet separation  was normal.  Doppler:  Transvalvular velocity was within the normal range. There was trivial regurgitation.  ------------------------------------------------------------------- Right atrium:  The atrium was normal in size.  ------------------------------------------------------------------- Pericardium:  There was no pericardial effusion.  ------------------------------------------------------------------- Measurements   Left ventricle                           Value          Reference  LV ID, ED, PLAX chordal                  49.2  mm       43 - 52  LV ID, ES, PLAX chordal                  32.3  mm       23 - 38  LV fx shortening, PLAX chordal           34    %         >=29  LV PW thickness, ED                      12.9  mm       ----------  IVS/LV PW ratio, ED                      1.02           <=1.3  Stroke volume, 2D                        88    ml       ----------  Stroke volume/bsa, 2D                    37    ml/m^2   ----------  LV e&', lateral                           13.5  cm/s     ----------  LV E/e&', lateral                         6.71           ----------  LV e&', medial                            9.25  cm/s     ----------  LV E/e&', medial                          9.79           ----------  LV e&', average                           11.38 cm/s     ----------  LV E/e&', average                         7.96           ----------  Longitudinal strain, TDI                 23    %        ----------    Ventricular septum  Value          Reference  IVS thickness, ED                        13.2  mm       ----------    LVOT                                     Value          Reference  LVOT ID, S                               21    mm       ----------  LVOT area                                3.46  cm^2     ----------  LVOT peak velocity, S                    121   cm/s     ----------  LVOT mean velocity, S                    84.1  cm/s     ----------  LVOT VTI, S                              25.4  cm       ----------  LVOT peak gradient, S                    6     mm Hg    ----------    Aortic valve                             Value          Reference  Aortic valve peak velocity, S            350   cm/s     ----------  Aortic valve mean velocity, S            237   cm/s     ----------  Aortic valve VTI, S                      62.8  cm       ----------  Aortic mean gradient, S                  27    mm Hg    ----------  Aortic peak gradient, S                  49    mm Hg    ----------  VTI ratio, LVOT/AV                       0.4            ----------  Aortic valve area, VTI                   1.4   cm^2     ----------   Aortic  valve area/bsa, VTI               0.59  cm^2/m^2 ----------  Velocity ratio, peak, LVOT/AV            0.35           ----------  Aortic valve area, peak velocity         1.2   cm^2     ----------  Aortic valve area/bsa, peak              0.5   cm^2/m^2 ----------  velocity  Velocity ratio, mean, LVOT/AV            0.35           ----------  Aortic valve area, mean velocity         1.23  cm^2     ----------  Aortic valve area/bsa, mean              0.51  cm^2/m^2 ----------  velocity    Aorta                                    Value          Reference  Aortic root ID, ED                       34    mm       ----------    Left atrium                              Value          Reference  LA ID, A-P, ES                           36    mm       ----------  LA ID/bsa, A-P                           1.51  cm/m^2   <=2.2  LA volume, S                             73.6  ml       ----------  LA volume/bsa, S                         30.9  ml/m^2   ----------  LA volume, ES, 1-p A4C                   67.8  ml       ----------  LA volume/bsa, ES, 1-p A4C               28.4  ml/m^2   ----------  LA volume, ES, 1-p A2C                   79.1  ml       ----------  LA volume/bsa, ES, 1-p A2C               33.2  ml/m^2   ----------    Mitral valve  Value          Reference  Mitral E-wave peak velocity              90.6  cm/s     ----------  Mitral A-wave peak velocity              49.6  cm/s     ----------  Mitral deceleration time                 218   ms       150 - 230  Mitral peak gradient, D                  3     mm Hg    ----------  Mitral E/A ratio, peak                   1.8            ----------    Right atrium                             Value          Reference  RA ID, S-I, ES, A4C               (H)    51.8  mm       34 - 49  RA area, ES, A4C                  (H)    19.9  cm^2     8.3 - 19.5  RA volume, ES, A/L                       61.5  ml        ----------  RA volume/bsa, ES, A/L                   25.8  ml/m^2   ----------    Right ventricle                          Value          Reference  RV ID, minor axis, ED, A4C base          42.8  mm       ----------  RV ID, minor axis, ED, A4C mid           40.6  mm       ----------  RV ID, major axis, ED, A4C               77.8  mm       55 - 91  TAPSE                                    16.1  mm       ----------  Legend: (L)  and  (H)  mark values outside specified reference range.  ------------------------------------------------------------------- Constance Holster, M.D. 2019-06-27T12:40:56   CT ANGIOGRAPHY CHEST WITH CONTRAST  TECHNIQUE: Multidetector CT imaging of the chest was performed using the standard protocol during bolus administration of intravenous contrast. Multiplanar CT image reconstructions and MIPs were obtained to evaluate the vascular anatomy.  CONTRAST:  53mL ISOVUE-370 IOPAMIDOL (ISOVUE-370) INJECTION 76%  COMPARISON:  12/2016, 11/26/2016  FINDINGS: Cardiovascular: There has been slight interval enlargement of a tubular ascending thoracic aortic aneurysm, which measures 5.0 x 4.9 cm, previously 4.8 x 4.6 cm measured similarly on examinations dating back to 11/26/2016. Status post median sternotomy and aortic valve repair. Normal heart size. No pericardial effusion.  Mediastinum/Nodes: No enlarged mediastinal, hilar, or axillary lymph nodes. Thyroid gland, trachea, and esophagus demonstrate no significant findings.  Lungs/Pleura: Lungs are clear. No pleural effusion or pneumothorax.  Upper Abdomen: No acute abnormality.  Musculoskeletal: No chest wall mass or suspicious bone lesions identified.  IMPRESSION: There has been minimal interval enlargement of a tubular ascending thoracic aortic aneurysm, which measures 5.0 x 4.9 cm, previously 4.8 x 4.6 cm measured similarly on examinations dating back to 11/26/2016. Status post  median sternotomy and aortic valve repair.   Electronically Signed   By: Lauralyn Primes M.D.   On: 10/03/2018 11:09    Impression:  Patient has stable moderate fusiform aneurysmal enlargement of the ascending thoracic aorta with history of aortic valve replacement using a mechanical prosthetic valve in the remote past.  I have personally reviewed the patient's recent follow-up CT angiogram which demonstrates that maximum transverse diameter of the ascending thoracic aorta remains unchanged in approximately 4.9 to 5.0 cm   Plan:  We have not recommended any changes to the patient's current medications.  The patient will return to our office for routine follow-up and repeat CT angiogram in approximately 1 year.  I have reminded the patient regarding the importance of continued close management of his hypertension.  He has also been reminded of the many benefits of regular exercise and heart healthy diet.   I spent in excess of 15 minutes during the conduct of this office consultation and >50% of this time involved direct face-to-face encounter with the patient for counseling and/or coordination of their care.    Salvatore Decent. [name], MD 10/03/2018 4:29 PM

## 2018-10-03 NOTE — Patient Instructions (Signed)
Description   Take 15mg  today and tomorrow, then resume same dosage 10 mg daily except 5mg  on Fridays.  Repeat INR in 2 weeks.

## 2018-10-03 NOTE — Patient Instructions (Addendum)
Continue all previous medications without any changes at this time  Check your blood pressure on a regular basis and keep a log for your records.  Discussed with your cardiologist and/or primary care physician whether or not your blood pressure medications should be adjusted.  

## 2018-10-04 LAB — CUP PACEART INCLINIC DEVICE CHECK
Date Time Interrogation Session: 20200203144211
Implantable Pulse Generator Implant Date: 20180807

## 2018-10-05 LAB — CUP PACEART REMOTE DEVICE CHECK
Date Time Interrogation Session: 20200201203602
Implantable Pulse Generator Implant Date: 20180807

## 2018-10-11 NOTE — Progress Notes (Signed)
Carelink Summary Report / Loop Recorder 

## 2018-10-14 ENCOUNTER — Ambulatory Visit (INDEPENDENT_AMBULATORY_CARE_PROVIDER_SITE_OTHER): Payer: Managed Care, Other (non HMO) | Admitting: Pharmacist

## 2018-10-14 DIAGNOSIS — Z7901 Long term (current) use of anticoagulants: Secondary | ICD-10-CM

## 2018-10-14 DIAGNOSIS — Z952 Presence of prosthetic heart valve: Secondary | ICD-10-CM | POA: Diagnosis not present

## 2018-10-14 DIAGNOSIS — I4891 Unspecified atrial fibrillation: Secondary | ICD-10-CM

## 2018-10-14 LAB — POCT INR: INR: 4.2 — AB (ref 2.0–3.0)

## 2018-10-14 NOTE — Patient Instructions (Signed)
Description   Continue same dosage 10 mg daily except 5mg  on Fridays.  Repeat INR in 3 weeks

## 2018-10-19 ENCOUNTER — Other Ambulatory Visit: Payer: Self-pay

## 2018-10-19 DIAGNOSIS — I721 Aneurysm of artery of upper extremity: Secondary | ICD-10-CM

## 2018-10-20 ENCOUNTER — Telehealth: Payer: Self-pay | Admitting: *Deleted

## 2018-10-20 ENCOUNTER — Ambulatory Visit (HOSPITAL_COMMUNITY)
Admission: RE | Admit: 2018-10-20 | Discharge: 2018-10-20 | Disposition: A | Discharge status: Home / Self Care | Payer: Managed Care, Other (non HMO) | Source: Ambulatory Visit | Attending: Vascular Surgery | Admitting: Vascular Surgery

## 2018-10-20 ENCOUNTER — Telehealth: Payer: Self-pay | Admitting: Pharmacist

## 2018-10-20 ENCOUNTER — Other Ambulatory Visit: Payer: Self-pay

## 2018-10-20 ENCOUNTER — Encounter: Payer: Self-pay | Admitting: *Deleted

## 2018-10-20 ENCOUNTER — Encounter: Payer: Self-pay | Admitting: Vascular Surgery

## 2018-10-20 ENCOUNTER — Ambulatory Visit: Payer: Managed Care, Other (non HMO) | Admitting: Vascular Surgery

## 2018-10-20 VITALS — BP 116/73 | HR 60 | Temp 97.6°F | Resp 14 | Ht 71.0 in | Wt 232.4 lb

## 2018-10-20 DIAGNOSIS — I721 Aneurysm of artery of upper extremity: Secondary | ICD-10-CM | POA: Insufficient documentation

## 2018-10-20 NOTE — Telephone Encounter (Signed)
Information called to ROBIN at [facility] Placement for patient to be admitted first available on 10/27/2018 for Heparin bridging pending angiogram and surgery.

## 2018-10-20 NOTE — Telephone Encounter (Signed)
Spoke with patient and made to call once discharged so that can resume care.
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
## 2018-10-20 NOTE — Telephone Encounter (Signed)
Patient called informing us he will have an Angiogram on 2/28 and graft surgery on 3/2. He has been asked to hold coumdain starting 2/25. Originally asked for clearance to be sent over. However later saw documentation that patient will be admitted 2/27 for heparin drip. Therefore no clearance needed.  Attempted to contact patient to let him know that he should follow instructions given by vascular surgery and call us when he is discharged to set up appointment to check INR. Left voicemail for patient to call us back

## 2018-10-20 NOTE — Progress Notes (Signed)
Patient instructed to hold Coumadin starting on 10/25/2018. Expect a phone call from [facility] bed placement to be admitted on 10/27/2018 for heparin infusion prior to angiogram on 10/28/2018 and then surgery for 10/31/2018.

## 2018-10-26 ENCOUNTER — Telehealth: Payer: Self-pay | Admitting: *Deleted

## 2018-10-26 NOTE — Telephone Encounter (Signed)
Phone call to [facility] Bed Placement. Spoke to [place].  Confirmed that patient is scheduled to be admitted on 11/25/2018. They will call patient when bed ready.

## 2018-10-27 ENCOUNTER — Inpatient Hospital Stay (HOSPITAL_COMMUNITY): Payer: Managed Care, Other (non HMO)

## 2018-10-27 ENCOUNTER — Encounter (HOSPITAL_COMMUNITY): Payer: Self-pay | Admitting: Neurology

## 2018-10-27 ENCOUNTER — Other Ambulatory Visit: Payer: Self-pay

## 2018-10-27 ENCOUNTER — Inpatient Hospital Stay (HOSPITAL_COMMUNITY)
Admission: AD | Admit: 2018-10-27 | Discharge: 2018-11-06 | DRG: 254 | Disposition: A | Discharge status: Home / Self Care | Payer: Managed Care, Other (non HMO) | Source: Ambulatory Visit | Attending: Vascular Surgery | Admitting: Vascular Surgery

## 2018-10-27 DIAGNOSIS — I480 Paroxysmal atrial fibrillation: Secondary | ICD-10-CM | POA: Diagnosis present

## 2018-10-27 DIAGNOSIS — Z7901 Long term (current) use of anticoagulants: Secondary | ICD-10-CM

## 2018-10-27 DIAGNOSIS — I721 Aneurysm of artery of upper extremity: Principal | ICD-10-CM | POA: Diagnosis present

## 2018-10-27 DIAGNOSIS — Z6832 Body mass index (BMI) 32.0-32.9, adult: Secondary | ICD-10-CM

## 2018-10-27 DIAGNOSIS — Z7982 Long term (current) use of aspirin: Secondary | ICD-10-CM

## 2018-10-27 DIAGNOSIS — E669 Obesity, unspecified: Secondary | ICD-10-CM | POA: Diagnosis present

## 2018-10-27 DIAGNOSIS — I4891 Unspecified atrial fibrillation: Secondary | ICD-10-CM | POA: Diagnosis not present

## 2018-10-27 DIAGNOSIS — Z79899 Other long term (current) drug therapy: Secondary | ICD-10-CM | POA: Diagnosis not present

## 2018-10-27 DIAGNOSIS — Z981 Arthrodesis status: Secondary | ICD-10-CM | POA: Diagnosis not present

## 2018-10-27 DIAGNOSIS — I712 Thoracic aortic aneurysm, without rupture: Secondary | ICD-10-CM | POA: Diagnosis present

## 2018-10-27 DIAGNOSIS — G4733 Obstructive sleep apnea (adult) (pediatric): Secondary | ICD-10-CM | POA: Diagnosis present

## 2018-10-27 DIAGNOSIS — Z01810 Encounter for preprocedural cardiovascular examination: Secondary | ICD-10-CM | POA: Diagnosis not present

## 2018-10-27 DIAGNOSIS — I2510 Atherosclerotic heart disease of native coronary artery without angina pectoris: Secondary | ICD-10-CM | POA: Diagnosis present

## 2018-10-27 DIAGNOSIS — J309 Allergic rhinitis, unspecified: Secondary | ICD-10-CM | POA: Diagnosis present

## 2018-10-27 DIAGNOSIS — I10 Essential (primary) hypertension: Secondary | ICD-10-CM | POA: Diagnosis present

## 2018-10-27 DIAGNOSIS — I739 Peripheral vascular disease, unspecified: Secondary | ICD-10-CM | POA: Diagnosis present

## 2018-10-27 DIAGNOSIS — Z9049 Acquired absence of other specified parts of digestive tract: Secondary | ICD-10-CM | POA: Diagnosis not present

## 2018-10-27 DIAGNOSIS — Z952 Presence of prosthetic heart valve: Secondary | ICD-10-CM | POA: Diagnosis not present

## 2018-10-27 DIAGNOSIS — T82898A Other specified complication of vascular prosthetic devices, implants and grafts, initial encounter: Secondary | ICD-10-CM | POA: Diagnosis not present

## 2018-10-27 DIAGNOSIS — Z01818 Encounter for other preprocedural examination: Secondary | ICD-10-CM

## 2018-10-27 DIAGNOSIS — Z885 Allergy status to narcotic agent status: Secondary | ICD-10-CM

## 2018-10-27 LAB — URINALYSIS, ROUTINE W REFLEX MICROSCOPIC
Bilirubin Urine: NEGATIVE
Glucose, UA: NEGATIVE mg/dL
Hgb urine dipstick: NEGATIVE
Ketones, ur: NEGATIVE mg/dL
Leukocytes,Ua: NEGATIVE
Nitrite: NEGATIVE
Protein, ur: NEGATIVE mg/dL
Specific Gravity, Urine: 1.005 (ref 1.005–1.030)
pH: 6 (ref 5.0–8.0)

## 2018-10-27 LAB — CBC
HCT: 37.7 % — ABNORMAL LOW (ref 39.0–52.0)
Hemoglobin: 12.6 g/dL — ABNORMAL LOW (ref 13.0–17.0)
MCH: 30.1 pg (ref 26.0–34.0)
MCHC: 33.4 g/dL (ref 30.0–36.0)
MCV: 90 fL (ref 80.0–100.0)
Platelets: 151 10*3/uL (ref 150–400)
RBC: 4.19 MIL/uL — ABNORMAL LOW (ref 4.22–5.81)
RDW: 12.1 % (ref 11.5–15.5)
WBC: 6.2 10*3/uL (ref 4.0–10.5)
nRBC: 0 % (ref 0.0–0.2)

## 2018-10-27 LAB — BASIC METABOLIC PANEL
Anion gap: 5 (ref 5–15)
BUN: 13 mg/dL (ref 6–20)
CO2: 27 mmol/L (ref 22–32)
Calcium: 8.7 mg/dL — ABNORMAL LOW (ref 8.9–10.3)
Chloride: 106 mmol/L (ref 98–111)
Creatinine, Ser: 0.99 mg/dL (ref 0.61–1.24)
GFR calc Af Amer: >60 mL/min (ref >60)
GFR calc non Af Amer: >60 mL/min (ref >60)
Glucose, Bld: 131 mg/dL — ABNORMAL HIGH (ref 70–99)
Potassium: 4.1 mmol/L (ref 3.5–5.1)
Sodium: 138 mmol/L (ref 135–145)

## 2018-10-27 LAB — MRSA PCR SCREENING: MRSA by PCR: NEGATIVE

## 2018-10-27 LAB — PROTIME-INR
INR: 1.6 — ABNORMAL HIGH (ref 0.8–1.2)
Prothrombin Time: 19.2 seconds — ABNORMAL HIGH (ref 11.4–15.2)

## 2018-10-27 MED ORDER — ACETAMINOPHEN 325 MG PO TABS
325.0000 mg | ORAL_TABLET | ORAL | Status: DC | PRN
  Administered 2018-10-28: 650 mg via ORAL
  Filled 2018-10-27: qty 2

## 2018-10-27 MED ORDER — HYDROCODONE-ACETAMINOPHEN 5-325 MG PO TABS: 1.0000 | ORAL_TABLET | Freq: Four times a day (QID) | ORAL | Status: DC | PRN

## 2018-10-27 MED ORDER — ASPIRIN EC 81 MG PO TBEC
81.0000 mg | DELAYED_RELEASE_TABLET | Freq: Every day | ORAL | Status: DC
  Administered 2018-10-27 – 2018-11-05 (×10): 81 mg via ORAL
  Filled 2018-10-27 (×10): qty 1

## 2018-10-27 MED ORDER — CARVEDILOL 6.25 MG PO TABS
6.2500 mg | ORAL_TABLET | Freq: Every day | ORAL | Status: DC
  Administered 2018-10-29 – 2018-11-05 (×7): 6.25 mg via ORAL
  Filled 2018-10-27 (×9): qty 1

## 2018-10-27 MED ORDER — ALUM & MAG HYDROXIDE-SIMETH 200-200-20 MG/5ML PO SUSP: 15.0000 mL | ORAL | Status: DC | PRN

## 2018-10-27 MED ORDER — PHENOL 1.4 % MT LIQD: 1.0000 | OROMUCOSAL | Status: DC | PRN

## 2018-10-27 MED ORDER — HEPARIN (PORCINE) 25000 UT/250ML-% IV SOLN
1400.0000 [IU]/h | INTRAVENOUS | Status: DC
  Administered 2018-10-27: 1400 [IU]/h via INTRAVENOUS
  Filled 2018-10-27: qty 250

## 2018-10-27 MED ORDER — VERAPAMIL HCL ER 120 MG PO CP24: 120.0000 mg | ORAL_CAPSULE | Freq: Every day | ORAL | Status: DC

## 2018-10-27 MED ORDER — POLYETHYLENE GLYCOL 3350 17 G PO PACK: 17.0000 g | PACK | Freq: Every day | ORAL | Status: DC | PRN

## 2018-10-27 MED ORDER — GUAIFENESIN-DM 100-10 MG/5ML PO SYRP: 15.0000 mL | ORAL_SOLUTION | ORAL | Status: DC | PRN

## 2018-10-27 MED ORDER — POTASSIUM CHLORIDE CRYS ER 20 MEQ PO TBCR: 20.0000 meq | EXTENDED_RELEASE_TABLET | Freq: Once | ORAL | Status: DC

## 2018-10-27 MED ORDER — LABETALOL HCL 5 MG/ML IV SOLN: 10.0000 mg | INTRAVENOUS | Status: DC | PRN

## 2018-10-27 MED ORDER — VERAPAMIL HCL ER 120 MG PO TBCR
120.0000 mg | EXTENDED_RELEASE_TABLET | Freq: Every day | ORAL | Status: DC
  Administered 2018-10-27 – 2018-11-05 (×10): 120 mg via ORAL
  Filled 2018-10-27 (×10): qty 1

## 2018-10-27 MED ORDER — METOPROLOL TARTRATE 5 MG/5ML IV SOLN
2.0000 mg | INTRAVENOUS | Status: DC | PRN
  Administered 2018-11-02: 2 mg via INTRAVENOUS
  Filled 2018-10-27: qty 5

## 2018-10-27 MED ORDER — SIMVASTATIN 20 MG PO TABS
20.0000 mg | ORAL_TABLET | Freq: Every day | ORAL | Status: DC
  Administered 2018-10-27 – 2018-11-05 (×10): 20 mg via ORAL
  Filled 2018-10-27 (×10): qty 1

## 2018-10-27 MED ORDER — HEPARIN BOLUS VIA INFUSION
2000.0000 [IU] | Freq: Once | INTRAVENOUS | Status: AC
  Administered 2018-10-27: 2000 [IU] via INTRAVENOUS
  Filled 2018-10-27: qty 2000

## 2018-10-27 MED ORDER — SODIUM CHLORIDE 0.9 % IV SOLN
INTRAVENOUS | Status: DC
  Administered 2018-10-27 – 2018-11-01 (×8): via INTRAVENOUS

## 2018-10-27 MED ORDER — ACETAMINOPHEN 325 MG RE SUPP: 325.0000 mg | RECTAL | Status: DC | PRN

## 2018-10-27 MED ORDER — MECLIZINE HCL 25 MG PO TABS
25.0000 mg | ORAL_TABLET | Freq: Three times a day (TID) | ORAL | Status: DC | PRN
  Administered 2018-10-30: 25 mg via ORAL
  Filled 2018-10-27: qty 1

## 2018-10-27 MED ORDER — ONDANSETRON HCL 4 MG/2ML IJ SOLN
4.0000 mg | Freq: Four times a day (QID) | INTRAMUSCULAR | Status: DC | PRN
  Administered 2018-10-30 – 2018-11-03 (×4): 4 mg via INTRAVENOUS
  Filled 2018-10-27 (×3): qty 2

## 2018-10-27 MED ORDER — PANTOPRAZOLE SODIUM 40 MG PO TBEC
40.0000 mg | DELAYED_RELEASE_TABLET | Freq: Every day | ORAL | Status: DC
  Administered 2018-10-27 – 2018-11-06 (×9): 40 mg via ORAL
  Filled 2018-10-27 (×9): qty 1

## 2018-10-27 MED ORDER — HYDRALAZINE HCL 20 MG/ML IJ SOLN: 5.0000 mg | INTRAMUSCULAR | Status: DC | PRN

## 2018-10-27 MED ORDER — SPIRONOLACTONE 25 MG PO TABS
25.0000 mg | ORAL_TABLET | Freq: Every day | ORAL | Status: DC
  Administered 2018-10-27 – 2018-11-05 (×10): 25 mg via ORAL
  Filled 2018-10-27 (×10): qty 1

## 2018-10-27 MED ORDER — DILTIAZEM HCL 60 MG PO TABS
60.0000 mg | ORAL_TABLET | Freq: Four times a day (QID) | ORAL | Status: DC | PRN
  Administered 2018-11-02: 60 mg via ORAL
  Filled 2018-10-27 (×2): qty 1

## 2018-10-27 MED ORDER — BISACODYL 10 MG RE SUPP: 10.0000 mg | Freq: Every day | RECTAL | Status: DC | PRN

## 2018-10-27 NOTE — Progress Notes (Signed)
Direct admit from home. CHG done, CCMD applied, VSS stable. Will continue to monitor and await orders.
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
## 2018-10-28 ENCOUNTER — Encounter (HOSPITAL_COMMUNITY): Payer: Self-pay | Admitting: Vascular Surgery

## 2018-10-28 ENCOUNTER — Inpatient Hospital Stay (HOSPITAL_COMMUNITY): Payer: Managed Care, Other (non HMO)

## 2018-10-28 ENCOUNTER — Encounter (HOSPITAL_COMMUNITY)
Admission: AD | Disposition: A | Discharge status: Home / Self Care | Payer: Self-pay | Source: Ambulatory Visit | Attending: Vascular Surgery

## 2018-10-28 ENCOUNTER — Ambulatory Visit (HOSPITAL_COMMUNITY)
Admission: RE | Admit: 2018-10-28 | Payer: Managed Care, Other (non HMO) | Source: Home / Self Care | Admitting: Vascular Surgery

## 2018-10-28 DIAGNOSIS — T82898A Other specified complication of vascular prosthetic devices, implants and grafts, initial encounter: Secondary | ICD-10-CM

## 2018-10-28 DIAGNOSIS — Z01810 Encounter for preprocedural cardiovascular examination: Secondary | ICD-10-CM

## 2018-10-28 HISTORY — PX: UPPER EXTREMITY ANGIOGRAPHY: CATH118270

## 2018-10-28 HISTORY — PX: AORTIC ARCH ANGIOGRAPHY: CATH118224

## 2018-10-28 LAB — CBC
HCT: 36.1 % — ABNORMAL LOW (ref 39.0–52.0)
Hemoglobin: 11.9 g/dL — ABNORMAL LOW (ref 13.0–17.0)
MCH: 29.8 pg (ref 26.0–34.0)
MCHC: 33 g/dL (ref 30.0–36.0)
MCV: 90.5 fL (ref 80.0–100.0)
Platelets: 145 10*3/uL — ABNORMAL LOW (ref 150–400)
RBC: 3.99 MIL/uL — ABNORMAL LOW (ref 4.22–5.81)
RDW: 12.2 % (ref 11.5–15.5)
WBC: 5.6 10*3/uL (ref 4.0–10.5)
nRBC: 0 % (ref 0.0–0.2)

## 2018-10-28 LAB — HIV ANTIBODY (ROUTINE TESTING W REFLEX): HIV Screen 4th Generation wRfx: NONREACTIVE

## 2018-10-28 LAB — HEPARIN LEVEL (UNFRACTIONATED): Heparin Unfractionated: 0.43 IU/mL (ref 0.30–0.70)

## 2018-10-28 LAB — PROTIME-INR
INR: 1.5 — ABNORMAL HIGH (ref 0.8–1.2)
Prothrombin Time: 18.3 seconds — ABNORMAL HIGH (ref 11.4–15.2)

## 2018-10-28 SURGERY — UPPER EXTREMITY ANGIOGRAPHY
Anesthesia: LOCAL | Laterality: Right

## 2018-10-28 MED ORDER — SODIUM CHLORIDE 0.9 % IV SOLN: INTRAVENOUS | Status: AC

## 2018-10-28 MED ORDER — ONDANSETRON HCL 4 MG/2ML IJ SOLN
4.0000 mg | Freq: Four times a day (QID) | INTRAMUSCULAR | Status: DC | PRN
  Filled 2018-10-28: qty 2

## 2018-10-28 MED ORDER — LABETALOL HCL 5 MG/ML IV SOLN: 10.0000 mg | INTRAVENOUS | Status: DC | PRN

## 2018-10-28 MED ORDER — HEPARIN (PORCINE) 25000 UT/250ML-% IV SOLN
1300.0000 [IU]/h | INTRAVENOUS | Status: DC
  Administered 2018-10-28 – 2018-10-29 (×2): 1400 [IU]/h via INTRAVENOUS
  Administered 2018-10-31: 1300 [IU]/h via INTRAVENOUS
  Filled 2018-10-28 (×4): qty 250

## 2018-10-28 MED ORDER — IODIXANOL 320 MG/ML IV SOLN
INTRAVENOUS | Status: DC | PRN
  Administered 2018-10-28: 90 mL via INTRAVENOUS

## 2018-10-28 MED ORDER — HYDRALAZINE HCL 20 MG/ML IJ SOLN: 5.0000 mg | INTRAMUSCULAR | Status: DC | PRN

## 2018-10-28 MED ORDER — SODIUM CHLORIDE 0.9% FLUSH: 3.0000 mL | INTRAVENOUS | Status: DC | PRN

## 2018-10-28 MED ORDER — SODIUM CHLORIDE 0.9% FLUSH
3.0000 mL | Freq: Two times a day (BID) | INTRAVENOUS | Status: DC
  Administered 2018-10-29 – 2018-11-06 (×10): 3 mL via INTRAVENOUS

## 2018-10-28 MED ORDER — HEPARIN (PORCINE) IN NACL 1000-0.9 UT/500ML-% IV SOLN
INTRAVENOUS | Status: DC | PRN
  Administered 2018-10-28 (×2): 500 mL

## 2018-10-28 MED ORDER — ACETAMINOPHEN 325 MG PO TABS: 650.0000 mg | ORAL_TABLET | ORAL | Status: DC | PRN

## 2018-10-28 MED ORDER — SODIUM CHLORIDE 0.9 % IV SOLN: 250.0000 mL | INTRAVENOUS | Status: DC | PRN

## 2018-10-28 MED ORDER — LIDOCAINE HCL (PF) 1 % IJ SOLN
INTRAMUSCULAR | Status: DC | PRN
  Administered 2018-10-28: 18 mL

## 2018-10-28 SURGICAL SUPPLY — 12 items
CATH ANGIO 5F BER2 100CM (CATHETERS) ×2 IMPLANT
CATH ANGIO 5F PIGTAIL 100CM (CATHETERS) ×1 IMPLANT
KIT PV (KITS) ×3 IMPLANT
SHEATH PINNACLE 5F 10CM (SHEATH) ×1 IMPLANT
SHEATH PROBE COVER 6X72 (BAG) ×2 IMPLANT
STOPCOCK MORSE 400PSI 3WAY (MISCELLANEOUS) ×2 IMPLANT
SYR MEDRAD MARK 7 150ML (SYRINGE) ×3 IMPLANT
TRANSDUCER W/STOPCOCK (MISCELLANEOUS) ×3 IMPLANT
TRAY PV CATH (CUSTOM PROCEDURE TRAY) ×3 IMPLANT
TUBING CIL FLEX 10 FLL-RA (TUBING) ×1 IMPLANT
WIRE BENTSON .035X145CM (WIRE) ×2 IMPLANT
WIRE HITORQ VERSACORE ST 145CM (WIRE) ×2 IMPLANT
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
## 2018-10-28 NOTE — Progress Notes (Signed)
ANTICOAGULATION CONSULT NOTE  Pharmacy Consult:  Heparin Indication:  Mechanical AVR + Afib  Allergies  Allergen Reactions  . Codeine Nausea And Vomiting  . Morphine And Related Nausea And Vomiting  . Percocet [Oxycodone-Acetaminophen] Nausea And Vomiting    Patient Measurements: Height: 5\' 11"  (180.3 cm) Weight: 232 lb 5.8 oz (105.4 kg) IBW/kg (Calculated) : 75.3 Heparin Dosing Weight: 97kg  Vital Signs: Temp: 97.9 F (36.6 C) (02/28 1042) Temp Source: Oral (02/28 1042) BP: 102/72 (02/28 1042) Pulse Rate: 64 (02/28 1042)  Labs: Recent Labs    10/27/18 1447 10/28/18 0038  HGB 12.6* 11.9*  HCT 37.7* 36.1*  PLT 151 145*  LABPROT 19.2* 18.3*  INR 1.6* 1.5*  HEPARINUNFRC  --  0.43  CREATININE 0.99  --     Estimated Creatinine Clearance: 104.1 mL/min (by C-G formula based on SCr of 0.99 mg/dL).   Assessment: 3 YOM with history of mechanical AVR and Afib admitted for bypass graft to RUE. Pharmacy consulted to dose IV heparin while Coumadin is on hold.  Patient underwent arteriogram this AM and Pharmacy consulted to resume IV heparin 6 hours post sheath removal.  Sheath removed around 1020 per documentation.  No bleeding/hematoma reported.  Goal of Therapy:  Heparin level 0.3-0.7 units/ml Monitor platelets by anticoagulation protocol: Yes   Plan:  At 1630, resume heparin gtt at 1400 units/hr Check heparin level 6 hr post resumption Daily heparin level and CBC D/C daily PT / INR until Coumadin is resumed   Najir Roop D. Laney Potash, PharmD, BCPS, BCCCP 10/28/2018, 11:04 AM

## 2018-10-28 NOTE — Progress Notes (Signed)
Bilateral lower extremities Saphenous veins mapping completed. Please see preliminary notes on CV PROC under chart review. Halona Amstutz H Seville Brick(RDMS RVT) 10/28/18 5:28 PM

## 2018-10-28 NOTE — Interval H&P Note (Signed)
History and Physical Interval Note:  10/28/2018 8:19 AM  Mark [name]  has presented today for surgery, with the diagnosis of aneurysm  The various methods of treatment have been discussed with the patient and family. After consideration of risks, benefits and other options for treatment, the patient has consented to  Procedure(s): UPPER EXTREMITY ANGIOGRAPHY (Right) as a surgical intervention .  The patient's history has been reviewed, patient examined, no change in status, stable for surgery.  I have reviewed the patient's chart and labs.  Questions were answered to the patient's satisfaction.     Fabienne Bruns

## 2018-10-28 NOTE — Op Note (Addendum)
Procedure: Arch aortogram with right upper extremity arteriogram  Preoperative diagnosis: Aneurysmal degeneration right arm bypass graft  Postoperative diagnosis: Same  Anesthesia: Local  Operative findings: Aneurysmal degeneration right arm bypass graft extending from the proximal to mid brachial artery, three-vessel runoff to the right hand  Operative details: After pain informed consent, the patient was brought to the PV lab.  The patient was placed in supine position Angie table.  Both groins were prepped and draped in usual sterile fashion.  Local anesthesia was infiltrated over the right common femoral artery.  Ultrasound was used to identify the right common femoral artery and femoral bifurcation.  Image was obtained for the patient's record.  The artery was patent.  Next an introducer needle was used to cannulate the right common femoral artery and an 035 versa core wire threaded all the way up into the descending aorta under fluoroscopic guidance.  A 5 French sheath placed over the guidewire in the right common femoral artery.  This was thoroughly flushed with heparinized saline.  5 French pigtail catheter was then advanced over the guidewire soon into the ascending aorta.  Arch aortogram was then obtained in a 60 degree LAO projection.  This shows normal arch anatomy although the vessels are fairly tortuous.  The innominate right common carotid right subclavian left common carotid and left subclavian origins are all widely patent.  The vertebral arteries are both widely patent.  Next pigtail catheter was removed over guidewire and exchanged for an 035 Bentson wire.  This was also placed over a 5 [place] Berenstein 2 catheter.  This was advanced up to the level of the innominate artery and the innominate artery was selected followed by the right subclavian artery.  Right upper extremity arteriogram was then obtained through the Berenstein catheter with the tip of the catheter in the axillary  artery.  Right upper extremity shows a patent axillary artery and brachial artery.  There is a vein bypass graft extending from the proximal brachial artery to the mid brachial artery which has aneurysmal degeneration and calcification.  There is some pooling of contrast within the aneurysm.  The distal brachial artery and trifurcation are all widely patent.  The brachial radial ulnar and interosseous arteries are all widely patent.  Contrast is fairly poorly opacified in the hand but good three-vessel runoff to the hand.  At this point the [facility] catheter was removed over guidewire.  The 5 French sheath was left in place to be pulled in the holding area.  The patient tolerated the procedure well and there were no complications.  Operative management: Patient is scheduled for redo of his right upper extremity bypass on Monday, October 31, 2018.  Fabienne Bruns, MD [facility] [place] Office: [phone] Pager: [phone]
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
## 2018-10-28 NOTE — Progress Notes (Signed)
Site area: Right groin a 5 french arterial sheath was removed by Monia Sabal RN  Site Prior to Removal:  Level 0  Pressure Applied For 20 MINUTES    Bedrest Beginning at 1025am  Manual:   Yes.    Patient Status During Pull:  stable  Post Pull Groin Site:  Level 0  Post Pull Instructions Given:  Yes.    Post Pull Pulses Present:  Yes.    Dressing Applied:  Yes.    Comments:  VS remains stable

## 2018-10-28 NOTE — Progress Notes (Signed)
Nutrition Brief Note  Patient identified on the Malnutrition Screening Tool (MST) Report.  Wt Readings from Last 15 Encounters:  10/27/18 105.4 kg  10/20/18 105.4 kg  10/03/18 106.6 kg  10/03/18 106.1 kg  09/28/18 108 kg  08/26/18 110.2 kg  02/24/18 109.3 kg  12/08/17 110.2 kg  11/29/17 112 kg  11/15/17 110.8 kg  10/28/17 108.9 kg  10/01/17 110.3 kg  09/29/17 108.4 kg  09/22/17 110.9 kg  09/20/17 116.1 kg   Body mass index is 32.41 kg/m. Patient meets criteria for Obesity Class I based on current BMI.   Currently NPO for procedure. Previously on a Heart Healthy diet, patient was consuming approximately 100% of meals.   Labs and medications reviewed. No nutrition interventions warranted at this time.   If nutrition issues arise, please consult RD.   Maureen Chatters, RD, LDN Pager #: [phone] After-Hours Pager #: [phone]

## 2018-10-29 LAB — HEPARIN LEVEL (UNFRACTIONATED)
Heparin Unfractionated: 0.32 IU/mL (ref 0.30–0.70)
Heparin Unfractionated: 0.58 IU/mL (ref 0.30–0.70)

## 2018-10-29 LAB — CBC
HCT: 35.6 % — ABNORMAL LOW (ref 39.0–52.0)
Hemoglobin: 12.1 g/dL — ABNORMAL LOW (ref 13.0–17.0)
MCH: 30.6 pg (ref 26.0–34.0)
MCHC: 34 g/dL (ref 30.0–36.0)
MCV: 90.1 fL (ref 80.0–100.0)
Platelets: 152 10*3/uL (ref 150–400)
RBC: 3.95 MIL/uL — ABNORMAL LOW (ref 4.22–5.81)
RDW: 12.3 % (ref 11.5–15.5)
WBC: 6.4 10*3/uL (ref 4.0–10.5)
nRBC: 0 % (ref 0.0–0.2)

## 2018-10-29 LAB — BASIC METABOLIC PANEL
Anion gap: 7 (ref 5–15)
BUN: 17 mg/dL (ref 6–20)
CO2: 24 mmol/L (ref 22–32)
Calcium: 8.7 mg/dL — ABNORMAL LOW (ref 8.9–10.3)
Chloride: 106 mmol/L (ref 98–111)
Creatinine, Ser: 0.97 mg/dL (ref 0.61–1.24)
GFR calc Af Amer: >60 mL/min (ref >60)
GFR calc non Af Amer: >60 mL/min (ref >60)
Glucose, Bld: 105 mg/dL — ABNORMAL HIGH (ref 70–99)
Potassium: 4.1 mmol/L (ref 3.5–5.1)
Sodium: 137 mmol/L (ref 135–145)

## 2018-10-29 MED ORDER — SODIUM CHLORIDE 0.9% FLUSH: 10.0000 mL | INTRAVENOUS | Status: DC | PRN

## 2018-10-29 NOTE — Progress Notes (Signed)
Consult was placed to have a midline placed in the upper Left arm;  Pt going for surgery on Monday;  Has a peripheral iv in the left forearm with heparin infusing;  Pt wants a line that will guarantee a blood return for lab draws;  Explained to the pt and his wife that midlines do not always give a guaranteed blood return  whereas a picc line would;  RN has called the MD;  Will place midline if pt is willing.   Barkley Bruns RN

## 2018-10-29 NOTE — Progress Notes (Signed)
  Progress Note    10/29/2018 11:24 AM 1 Day Post-Op  Subjective: No overnight issues  Vitals:   10/29/18 0700 10/29/18 0900  BP:    Pulse:    Resp: 15 (!) 23  Temp:    SpO2:      Physical Exam: Awake alert oriented Large palpable aneurysm right upper arm and hand is well-perfused  CBC    Component Value Date/Time   WBC 6.4 10/29/2018 0019   RBC 3.95 (L) 10/29/2018 0019   HGB 12.1 (L) 10/29/2018 0019   HGB 13.1 06/21/2017 0743   HCT 35.6 (L) 10/29/2018 0019   HCT 38.6 06/21/2017 0743   PLT 152 10/29/2018 0019   PLT 172 06/21/2017 0743   MCV 90.1 10/29/2018 0019   MCV 89 06/21/2017 0743   MCH 30.6 10/29/2018 0019   MCHC 34.0 10/29/2018 0019   RDW 12.3 10/29/2018 0019   RDW 13.0 06/21/2017 0743   LYMPHSABS 2.0 09/28/2018 1631   LYMPHSABS 1.8 06/21/2017 0743   MONOABS 0.5 09/28/2018 1631   EOSABS 0.2 09/28/2018 1631   EOSABS 0.1 06/21/2017 0743   BASOSABS 0.1 09/28/2018 1631   BASOSABS 0.0 06/21/2017 0743    BMET    Component Value Date/Time   NA 137 10/29/2018 0019   NA 142 06/21/2017 0743   K 4.1 10/29/2018 0019   CL 106 10/29/2018 0019   CO2 24 10/29/2018 0019   GLUCOSE 105 (H) 10/29/2018 0019   BUN 17 10/29/2018 0019   BUN 19 06/21/2017 0743   CREATININE 0.97 10/29/2018 0019   CALCIUM 8.7 (L) 10/29/2018 0019   GFRNONAA >60 10/29/2018 0019   GFRAA >60 10/29/2018 0019    INR    Component Value Date/Time   INR 1.5 (H) 10/28/2018 0038   INR 1.7 10/08/2010 0820     Intake/Output Summary (Last 24 hours) at 10/29/2018 1124 Last data filed at 10/29/2018 1000 Gross per 24 hour  Intake 1847.51 ml  Output -  Net 1847.51 ml     Assessment/plan:  58 y.o. male is status post arch aortogram.  He is on heparin drip for mechanical valve and is planned for operative intervention on Monday.  All questions have been answered.  He is okay for shower and midline in left upper arm.   Asahel Risden C. Randie Heinz, MD [facility] [place] Office:  [phone] Pager: [phone]  10/29/2018 11:24 AM

## 2018-10-29 NOTE — Progress Notes (Signed)
ANTICOAGULATION CONSULT NOTE  Pharmacy Consult:  Heparin Indication:  Mechanical AVR + Afib  Allergies  Allergen Reactions  . Codeine Nausea And Vomiting  . Morphine And Related Nausea And Vomiting  . Percocet [Oxycodone-Acetaminophen] Nausea And Vomiting    Patient Measurements: Height: 5\' 11"  (180.3 cm) Weight: 232 lb 5.8 oz (105.4 kg) IBW/kg (Calculated) : 75.3 Heparin Dosing Weight: 97kg  Vital Signs: Temp: 98.3 F (36.8 C) (02/28 2010) Temp Source: Oral (02/28 2010) BP: 113/68 (02/28 2010) Pulse Rate: 91 (02/28 2010)  Labs: Recent Labs    10/27/18 1447 10/28/18 0038 10/29/18 0019  HGB 12.6* 11.9* 12.1*  HCT 37.7* 36.1* 35.6*  PLT 151 145* 152  LABPROT 19.2* 18.3*  --   INR 1.6* 1.5*  --   HEPARINUNFRC  --  0.43 0.32  CREATININE 0.99  --   --     Estimated Creatinine Clearance: 104.1 mL/min (by C-G formula based on SCr of 0.99 mg/dL).   Assessment: 67 YOM with history of mechanical AVR and Afib admitted for bypass graft to RUE. Pharmacy consulted to dose IV heparin while Coumadin is on hold.  Patient underwent arteriogram this AM and Pharmacy consulted to resume IV heparin 6 hours post sheath removal.  Sheath removed around 1020 per documentation.  No bleeding/hematoma reported.  2/29 AM update: heparin level therapeutic x 1 after re-start s/p arteriogram   Goal of Therapy:  Heparin level 0.3-0.7 units/ml Monitor platelets by anticoagulation protocol: Yes   Plan:  Cont heparin at 1400 units/hr Confirmatory heparin level with AM labs  Abran Duke, PharmD, BCPS Clinical Pharmacist Phone: [phone]

## 2018-10-29 NOTE — Progress Notes (Signed)
ANTICOAGULATION CONSULT NOTE  Pharmacy Consult:  Heparin Indication:  Mechanical AVR + Afib  Allergies  Allergen Reactions  . Codeine Nausea And Vomiting  . Morphine And Related Nausea And Vomiting  . Percocet [Oxycodone-Acetaminophen] Nausea And Vomiting    Patient Measurements: Height: 5\' 11"  (180.3 cm) Weight: 232 lb 5.8 oz (105.4 kg) IBW/kg (Calculated) : 75.3 Heparin Dosing Weight: 97kg  Vital Signs: Temp: 97.7 F (36.5 C) (02/29 0513) Temp Source: Oral (02/29 0513) BP: 115/74 (02/29 0513) Pulse Rate: 70 (02/29 0513)  Labs: Recent Labs    10/27/18 1447 10/28/18 0038 10/29/18 0019 10/29/18 0659  HGB 12.6* 11.9* 12.1*  --   HCT 37.7* 36.1* 35.6*  --   PLT 151 145* 152  --   LABPROT 19.2* 18.3*  --   --   INR 1.6* 1.5*  --   --   HEPARINUNFRC  --  0.43 0.32 0.58  CREATININE 0.99  --  0.97  --     Estimated Creatinine Clearance: 106.3 mL/min (by C-G formula based on SCr of 0.97 mg/dL).   Assessment: 68 YOM with history of mechanical AVR and Afib admitted for bypass graft to RUE. Pharmacy consulted to dose IV heparin while Coumadin is on hold.  Patient underwent arteriogram on 10/28/18 and Pharmacy consulted to resume IV heparin. Repeat HL this AM remains therapeutic at 0.58. H/H and Plt improved.    Goal of Therapy:  Heparin level 0.3-0.7 units/ml Monitor platelets by anticoagulation protocol: Yes   Plan:  Cont heparin at 1400 units/hr Monitor daily HL, CBC and s/s of bleeding F/u resuming oral anticoagulation   Vinnie Level, PharmD., BCPS Clinical Pharmacist Clinical phone for 10/29/18 until 3:30pm: [phone] If after 3:30pm, please refer to [facility] for unit-specific pharmacist

## 2018-10-30 LAB — CBC
HCT: 39.6 % (ref 39.0–52.0)
Hemoglobin: 13.3 g/dL (ref 13.0–17.0)
MCH: 30.6 pg (ref 26.0–34.0)
MCHC: 33.6 g/dL (ref 30.0–36.0)
MCV: 91.2 fL (ref 80.0–100.0)
Platelets: 180 10*3/uL (ref 150–400)
RBC: 4.34 MIL/uL (ref 4.22–5.81)
RDW: 12.1 % (ref 11.5–15.5)
WBC: 7.2 10*3/uL (ref 4.0–10.5)
nRBC: 0 % (ref 0.0–0.2)

## 2018-10-30 LAB — HEPARIN LEVEL (UNFRACTIONATED)
Heparin Unfractionated: 0.73 IU/mL — ABNORMAL HIGH (ref 0.30–0.70)
Heparin Unfractionated: 0.45 IU/mL (ref 0.30–0.70)

## 2018-10-30 NOTE — Progress Notes (Signed)
  Progress Note    10/30/2018 1:26 PM 2 Days Post-Op  Subjective: No overnight issues  Vitals:   10/30/18 0251 10/30/18 0545  BP: (!) 150/93 100/63  Pulse: (!) 53 (!) 46  Resp: 16 19  Temp: 98.2 F (36.8 C) 98 F (36.7 C)  SpO2: 98% 98%    Physical Exam: Awake alert oriented Stable aneurysmal degeneration right upper extremity Hand well perfused on the right  CBC    Component Value Date/Time   WBC 7.2 10/30/2018 0313   RBC 4.34 10/30/2018 0313   HGB 13.3 10/30/2018 0313   HGB 13.1 06/21/2017 0743   HCT 39.6 10/30/2018 0313   HCT 38.6 06/21/2017 0743   PLT 180 10/30/2018 0313   PLT 172 06/21/2017 0743   MCV 91.2 10/30/2018 0313   MCV 89 06/21/2017 0743   MCH 30.6 10/30/2018 0313   MCHC 33.6 10/30/2018 0313   RDW 12.1 10/30/2018 0313   RDW 13.0 06/21/2017 0743   LYMPHSABS 2.0 09/28/2018 1631   LYMPHSABS 1.8 06/21/2017 0743   MONOABS 0.5 09/28/2018 1631   EOSABS 0.2 09/28/2018 1631   EOSABS 0.1 06/21/2017 0743   BASOSABS 0.1 09/28/2018 1631   BASOSABS 0.0 06/21/2017 0743    BMET    Component Value Date/Time   NA 137 10/29/2018 0019   NA 142 06/21/2017 0743   K 4.1 10/29/2018 0019   CL 106 10/29/2018 0019   CO2 24 10/29/2018 0019   GLUCOSE 105 (H) 10/29/2018 0019   BUN 17 10/29/2018 0019   BUN 19 06/21/2017 0743   CREATININE 0.97 10/29/2018 0019   CALCIUM 8.7 (L) 10/29/2018 0019   GFRNONAA >60 10/29/2018 0019   GFRAA >60 10/29/2018 0019    INR    Component Value Date/Time   INR 1.5 (H) 10/28/2018 0038   INR 1.7 10/08/2010 0820     Intake/Output Summary (Last 24 hours) at 10/30/2018 1326 Last data filed at 10/30/2018 1011 Gross per 24 hour  Intake 730 ml  Output -  Net 730 ml     Assessment:  58 y.o. male is s/p arch aortogram with right upper extremity angiogram.  On heparin drip for mechanical valve. Plan: N.p.o. past midnight for right upper extremity bypass tomorrow.   Jenay Morici C. Randie Heinz, MD [facility]  [place] Office: [phone] Pager: [phone]  10/30/2018 1:26 PM

## 2018-10-30 NOTE — Progress Notes (Signed)
ANTICOAGULATION CONSULT NOTE  Pharmacy Consult:  Heparin Indication:  Mechanical AVR + Afib  Allergies  Allergen Reactions  . Codeine Nausea And Vomiting  . Morphine And Related Nausea And Vomiting  . Percocet [Oxycodone-Acetaminophen] Nausea And Vomiting    Patient Measurements: Height: 5\' 11"  (180.3 cm) Weight: 232 lb 5.8 oz (105.4 kg) IBW/kg (Calculated) : 75.3 Heparin Dosing Weight: 97kg  Vital Signs: Temp: 98 F (36.7 C) (03/01 0545) Temp Source: Oral (03/01 0545) BP: 100/63 (03/01 0545) Pulse Rate: 46 (03/01 0545)  Labs: Recent Labs    10/27/18 1447  10/28/18 0038 10/29/18 0019 10/29/18 0659 10/30/18 0313 10/30/18 1338  HGB 12.6*  --  11.9* 12.1*  --  13.3  --   HCT 37.7*  --  36.1* 35.6*  --  39.6  --   PLT 151  --  145* 152  --  180  --   LABPROT 19.2*  --  18.3*  --   --   --   --   INR 1.6*  --  1.5*  --   --   --   --   HEPARINUNFRC  --    < > 0.43 0.32 0.58 0.73* 0.45  CREATININE 0.99  --   --  0.97  --   --   --    < > = values in this interval not displayed.    Estimated Creatinine Clearance: 106.3 mL/min (by C-G formula based on SCr of 0.97 mg/dL).   Assessment: 37 YOM with history of mechanical AVR and Afib admitted for bypass graft to RUE. Pharmacy consulted to dose IV heparin while Coumadin is on hold.  Patient underwent arteriogram on 10/28/18 and Pharmacy consulted to resume IV heparin. Repeat HL is now therapeutic at 0.45 on 1300 units/hr of IV heparin.    Goal of Therapy:  Heparin level 0.3-0.7 units/ml Monitor platelets by anticoagulation protocol: Yes   Plan:  Continue heparin at 1300 units/hr Monitor daily heparin level, CBC and s/s of bleeding Right upper extremity bypass planned for tomorrow.     Vinnie Level, PharmD., BCPS Clinical Pharmacist Clinical phone for 10/30/18 until 3:30pm: [phone] If after 3:30pm, please refer to [facility] for unit-specific pharmacist

## 2018-10-30 NOTE — Progress Notes (Signed)
ANTICOAGULATION CONSULT NOTE  Pharmacy Consult:  Heparin Indication:  Mechanical AVR + Afib  Allergies  Allergen Reactions  . Codeine Nausea And Vomiting  . Morphine And Related Nausea And Vomiting  . Percocet [Oxycodone-Acetaminophen] Nausea And Vomiting    Patient Measurements: Height: 5\' 11"  (180.3 cm) Weight: 232 lb 5.8 oz (105.4 kg) IBW/kg (Calculated) : 75.3 Heparin Dosing Weight: 97kg  Vital Signs: Temp: 98 F (36.7 C) (03/01 0545) Temp Source: Oral (03/01 0545) BP: 100/63 (03/01 0545) Pulse Rate: 46 (03/01 0545)  Labs: Recent Labs    10/27/18 1447  10/28/18 0038 10/29/18 0019 10/29/18 0659 10/30/18 0313  HGB 12.6*  --  11.9* 12.1*  --  13.3  HCT 37.7*  --  36.1* 35.6*  --  39.6  PLT 151  --  145* 152  --  180  LABPROT 19.2*  --  18.3*  --   --   --   INR 1.6*  --  1.5*  --   --   --   HEPARINUNFRC  --    < > 0.43 0.32 0.58 0.73*  CREATININE 0.99  --   --  0.97  --   --    < > = values in this interval not displayed.    Estimated Creatinine Clearance: 106.3 mL/min (by C-G formula based on SCr of 0.97 mg/dL).   Assessment: Mark [name] with history of mechanical AVR and Afib admitted for bypass graft to RUE. Pharmacy consulted to dose IV heparin while Coumadin is on hold.  Patient underwent arteriogram on 10/28/18 and Pharmacy consulted to resume IV heparin. Heparin level slightly supratherapeutic at 0.73 this morning. H/H and pltc have improved. No bleeding noted.   Goal of Therapy:  Heparin level 0.3-0.7 units/ml Monitor platelets by anticoagulation protocol: Yes   Plan:  Decrease heparin slightly to 1300 units/hr Check 6 hour heparin level following rate change Monitor daily heparin level, CBC and s/s of bleeding F/u resuming oral anticoagulation   Danae Orleans, PharmD PGY1 Pharmacy Resident Phone [phone] 10/30/2018       7:14 AM

## 2018-10-31 ENCOUNTER — Encounter (HOSPITAL_COMMUNITY): Payer: Self-pay | Admitting: Certified Registered Nurse Anesthetist

## 2018-10-31 ENCOUNTER — Encounter (HOSPITAL_COMMUNITY)
Admission: AD | Disposition: A | Discharge status: Home / Self Care | Payer: Self-pay | Source: Ambulatory Visit | Attending: Vascular Surgery

## 2018-10-31 ENCOUNTER — Inpatient Hospital Stay (HOSPITAL_COMMUNITY)
Admission: RE | Admit: 2018-10-31 | Payer: Managed Care, Other (non HMO) | Source: Home / Self Care | Admitting: Vascular Surgery

## 2018-10-31 ENCOUNTER — Inpatient Hospital Stay (HOSPITAL_COMMUNITY): Payer: Managed Care, Other (non HMO) | Admitting: Certified Registered Nurse Anesthetist

## 2018-10-31 HISTORY — PX: VEIN HARVEST: SHX6363

## 2018-10-31 HISTORY — PX: BYPASS AXILLA/BRACHIAL ARTERY: SHX6426

## 2018-10-31 LAB — CBC
HCT: 35.5 % — ABNORMAL LOW (ref 39.0–52.0)
Hemoglobin: 11.4 g/dL — ABNORMAL LOW (ref 13.0–17.0)
MCH: 29.8 pg (ref 26.0–34.0)
MCHC: 32.1 g/dL (ref 30.0–36.0)
MCV: 92.9 fL (ref 80.0–100.0)
Platelets: 148 10*3/uL — ABNORMAL LOW (ref 150–400)
RBC: 3.82 MIL/uL — ABNORMAL LOW (ref 4.22–5.81)
RDW: 12.2 % (ref 11.5–15.5)
WBC: 6 10*3/uL (ref 4.0–10.5)
nRBC: 0 % (ref 0.0–0.2)

## 2018-10-31 LAB — SURGICAL PCR SCREEN
MRSA, PCR: NEGATIVE
Staphylococcus aureus: NEGATIVE

## 2018-10-31 LAB — HEPARIN LEVEL (UNFRACTIONATED): Heparin Unfractionated: 0.41 IU/mL (ref 0.30–0.70)

## 2018-10-31 SURGERY — CREATION, BYPASS, ARTERIAL, AXILLARY TO BRACHIAL
Anesthesia: General | Site: Leg Upper | Laterality: Right

## 2018-10-31 MED ORDER — ONDANSETRON HCL 4 MG/2ML IJ SOLN
INTRAMUSCULAR | Status: DC | PRN
  Administered 2018-10-31: 4 mg via INTRAVENOUS

## 2018-10-31 MED ORDER — ROCURONIUM BROMIDE 50 MG/5ML IV SOSY
PREFILLED_SYRINGE | INTRAVENOUS | Status: AC
  Filled 2018-10-31: qty 5

## 2018-10-31 MED ORDER — ROCURONIUM BROMIDE 10 MG/ML (PF) SYRINGE
PREFILLED_SYRINGE | INTRAVENOUS | Status: DC | PRN
  Administered 2018-10-31: 10 mg via INTRAVENOUS
  Administered 2018-10-31: 50 mg via INTRAVENOUS
  Administered 2018-10-31: 25 mg via INTRAVENOUS
  Administered 2018-10-31: 50 mg via INTRAVENOUS
  Administered 2018-10-31: 25 mg via INTRAVENOUS

## 2018-10-31 MED ORDER — HEPARIN SODIUM (PORCINE) 1000 UNIT/ML IJ SOLN
INTRAMUSCULAR | Status: AC
  Filled 2018-10-31: qty 1

## 2018-10-31 MED ORDER — LACTATED RINGERS IV SOLN
INTRAVENOUS | Status: DC
  Administered 2018-10-31 (×2): via INTRAVENOUS

## 2018-10-31 MED ORDER — GLYCOPYRROLATE PF 0.2 MG/ML IJ SOSY
PREFILLED_SYRINGE | INTRAMUSCULAR | Status: AC
  Filled 2018-10-31: qty 1

## 2018-10-31 MED ORDER — FENTANYL CITRATE (PF) 250 MCG/5ML IJ SOLN
INTRAMUSCULAR | Status: DC | PRN
  Administered 2018-10-31 (×3): 50 ug via INTRAVENOUS
  Administered 2018-10-31: 100 ug via INTRAVENOUS

## 2018-10-31 MED ORDER — GLYCOPYRROLATE PF 0.2 MG/ML IJ SOSY
PREFILLED_SYRINGE | INTRAMUSCULAR | Status: DC | PRN
  Administered 2018-10-31: .2 mg via INTRAVENOUS

## 2018-10-31 MED ORDER — PROTAMINE SULFATE 10 MG/ML IV SOLN
INTRAVENOUS | Status: DC | PRN
  Administered 2018-10-31 (×2): 25 mg via INTRAVENOUS

## 2018-10-31 MED ORDER — PROPOFOL 10 MG/ML IV BOLUS
INTRAVENOUS | Status: AC
  Filled 2018-10-31: qty 20

## 2018-10-31 MED ORDER — CEFAZOLIN SODIUM-DEXTROSE 2-4 GM/100ML-% IV SOLN
INTRAVENOUS | Status: AC
  Filled 2018-10-31: qty 100

## 2018-10-31 MED ORDER — HYDROMORPHONE HCL 1 MG/ML IJ SOLN
0.2500 mg | INTRAMUSCULAR | Status: DC | PRN
  Administered 2018-10-31 (×2): 0.5 mg via INTRAVENOUS

## 2018-10-31 MED ORDER — ROCURONIUM BROMIDE 50 MG/5ML IV SOSY
PREFILLED_SYRINGE | INTRAVENOUS | Status: AC
  Filled 2018-10-31: qty 15

## 2018-10-31 MED ORDER — HYDROCODONE-ACETAMINOPHEN 5-325 MG PO TABS
1.0000 | ORAL_TABLET | ORAL | Status: DC | PRN
  Administered 2018-10-31 – 2018-11-04 (×14): 2 via ORAL
  Filled 2018-10-31 (×16): qty 2

## 2018-10-31 MED ORDER — HEPARIN SODIUM (PORCINE) 1000 UNIT/ML IJ SOLN
INTRAMUSCULAR | Status: DC | PRN
  Administered 2018-10-31: 10000 [IU] via INTRAVENOUS

## 2018-10-31 MED ORDER — PROTAMINE SULFATE 10 MG/ML IV SOLN
INTRAVENOUS | Status: AC
  Filled 2018-10-31: qty 5

## 2018-10-31 MED ORDER — ONDANSETRON HCL 4 MG/2ML IJ SOLN
INTRAMUSCULAR | Status: AC
  Filled 2018-10-31: qty 2

## 2018-10-31 MED ORDER — SODIUM CHLORIDE 0.9 % IV SOLN
INTRAVENOUS | Status: AC
  Filled 2018-10-31: qty 1.2

## 2018-10-31 MED ORDER — PROMETHAZINE HCL 25 MG/ML IJ SOLN
INTRAMUSCULAR | Status: AC
  Administered 2018-10-31: 6.25 mg via INTRAVENOUS
  Filled 2018-10-31: qty 1

## 2018-10-31 MED ORDER — HYDROMORPHONE HCL 1 MG/ML IJ SOLN
0.5000 mg | INTRAMUSCULAR | Status: DC | PRN
  Administered 2018-10-31 – 2018-11-03 (×9): 0.5 mg via INTRAVENOUS
  Filled 2018-10-31 (×9): qty 1

## 2018-10-31 MED ORDER — ALBUMIN HUMAN 5 % IV SOLN
INTRAVENOUS | Status: DC | PRN
  Administered 2018-10-31 (×2): via INTRAVENOUS

## 2018-10-31 MED ORDER — LIDOCAINE 2% (20 MG/ML) 5 ML SYRINGE
INTRAMUSCULAR | Status: AC
  Filled 2018-10-31: qty 5

## 2018-10-31 MED ORDER — DEXAMETHASONE SODIUM PHOSPHATE 10 MG/ML IJ SOLN
INTRAMUSCULAR | Status: DC | PRN
  Administered 2018-10-31: 10 mg via INTRAVENOUS

## 2018-10-31 MED ORDER — PHENYLEPHRINE HCL 10 MG/ML IJ SOLN
INTRAMUSCULAR | Status: DC | PRN
  Administered 2018-10-31: 80 ug via INTRAVENOUS

## 2018-10-31 MED ORDER — MIDAZOLAM HCL 2 MG/2ML IJ SOLN
INTRAMUSCULAR | Status: AC
  Filled 2018-10-31: qty 2

## 2018-10-31 MED ORDER — PROPOFOL 10 MG/ML IV BOLUS
INTRAVENOUS | Status: DC | PRN
  Administered 2018-10-31: 150 mg via INTRAVENOUS
  Administered 2018-10-31: 50 mg via INTRAVENOUS

## 2018-10-31 MED ORDER — 0.9 % SODIUM CHLORIDE (POUR BTL) OPTIME
TOPICAL | Status: DC | PRN
  Administered 2018-10-31: 1000 mL

## 2018-10-31 MED ORDER — SODIUM CHLORIDE 0.9 % IV SOLN
INTRAVENOUS | Status: DC | PRN
  Administered 2018-10-31: 12:00:00

## 2018-10-31 MED ORDER — MIDAZOLAM HCL 2 MG/2ML IJ SOLN
INTRAMUSCULAR | Status: DC | PRN
  Administered 2018-10-31: 2 mg via INTRAVENOUS

## 2018-10-31 MED ORDER — PHENYLEPHRINE 40 MCG/ML (10ML) SYRINGE FOR IV PUSH (FOR BLOOD PRESSURE SUPPORT)
PREFILLED_SYRINGE | INTRAVENOUS | Status: AC
  Filled 2018-10-31: qty 10

## 2018-10-31 MED ORDER — DEXAMETHASONE SODIUM PHOSPHATE 10 MG/ML IJ SOLN
INTRAMUSCULAR | Status: AC
  Filled 2018-10-31: qty 1

## 2018-10-31 MED ORDER — FENTANYL CITRATE (PF) 250 MCG/5ML IJ SOLN
INTRAMUSCULAR | Status: AC
  Filled 2018-10-31: qty 5

## 2018-10-31 MED ORDER — HYDROMORPHONE HCL 1 MG/ML IJ SOLN
INTRAMUSCULAR | Status: AC
  Administered 2018-10-31: 0.5 mg via INTRAVENOUS
  Filled 2018-10-31: qty 1

## 2018-10-31 MED ORDER — PROMETHAZINE HCL 25 MG/ML IJ SOLN
6.2500 mg | Freq: Once | INTRAMUSCULAR | Status: AC
  Administered 2018-10-31: 6.25 mg via INTRAVENOUS

## 2018-10-31 MED ORDER — CEFAZOLIN SODIUM-DEXTROSE 2-4 GM/100ML-% IV SOLN
2.0000 g | Freq: Once | INTRAVENOUS | Status: AC
  Administered 2018-10-31: 2 g via INTRAVENOUS

## 2018-10-31 SURGICAL SUPPLY — 79 items
ADH SKN CLS APL DERMABOND .7 (GAUZE/BANDAGES/DRESSINGS) ×4
AGENT HMST SPONGE THK3/8 (HEMOSTASIS)
BAG ISL DRAPE 18X18 STRL (DRAPES) ×4
BAG ISOLATION DRAPE 18X18 (DRAPES) IMPLANT
BANDAGE ESMARK 6X9 LF (GAUZE/BANDAGES/DRESSINGS) IMPLANT
BNDG CMPR 9X6 STRL LF SNTH (GAUZE/BANDAGES/DRESSINGS)
BNDG ESMARK 6X9 LF (GAUZE/BANDAGES/DRESSINGS)
CANISTER SUCT 3000ML PPV (MISCELLANEOUS) ×3 IMPLANT
CANNULA VESSEL 3MM 2 BLNT TIP (CANNULA) ×6 IMPLANT
CLIP VESOCCLUDE MED 24/CT (CLIP) ×3 IMPLANT
CLIP VESOCCLUDE MED 6/CT (CLIP) ×5 IMPLANT
CLIP VESOCCLUDE SM WIDE 24/CT (CLIP) ×3 IMPLANT
CLIP VESOCCLUDE SM WIDE 6/CT (CLIP) ×4 IMPLANT
COVER PROBE W GEL 5X96 (DRAPES) ×1 IMPLANT
COVER WAND RF STERILE (DRAPES) ×3 IMPLANT
CUFF TOURNIQUET SINGLE 24IN (TOURNIQUET CUFF) IMPLANT
CUFF TOURNIQUET SINGLE 34IN LL (TOURNIQUET CUFF) IMPLANT
CUFF TOURNIQUET SINGLE 44IN (TOURNIQUET CUFF) IMPLANT
DECANTER SPIKE VIAL GLASS SM (MISCELLANEOUS) ×3 IMPLANT
DERMABOND ADVANCED (GAUZE/BANDAGES/DRESSINGS) ×2
DERMABOND ADVANCED .7 DNX12 (GAUZE/BANDAGES/DRESSINGS) ×2 IMPLANT
DRAIN SNY WOU (WOUND CARE) IMPLANT
DRAPE HALF SHEET 40X57 (DRAPES) ×2 IMPLANT
DRAPE INCISE IOBAN 66X45 STRL (DRAPES) ×2 IMPLANT
DRAPE ISOLATION BAG 18X18 (DRAPES) ×2
DRAPE X-RAY CASS 24X20 (DRAPES) IMPLANT
ELECT REM PT RETURN 9FT ADLT (ELECTROSURGICAL) ×3
ELECTRODE REM PT RTRN 9FT ADLT (ELECTROSURGICAL) ×2 IMPLANT
EVACUATOR SILICONE 100CC (DRAIN) IMPLANT
GLOVE BIO SURGEON STRL SZ 6.5 (GLOVE) ×4 IMPLANT
GLOVE BIO SURGEON STRL SZ7.5 (GLOVE) ×7 IMPLANT
GLOVE BIO SURGEON STRL SZ8 (GLOVE) ×4 IMPLANT
GLOVE BIOGEL PI IND STRL 6.5 (GLOVE) IMPLANT
GLOVE BIOGEL PI IND STRL 7.0 (GLOVE) ×2 IMPLANT
GLOVE BIOGEL PI IND STRL 8 (GLOVE) ×1 IMPLANT
GLOVE BIOGEL PI INDICATOR 6.5 (GLOVE) ×2
GLOVE BIOGEL PI INDICATOR 7.0 (GLOVE) ×2
GLOVE BIOGEL PI INDICATOR 8 (GLOVE) ×1
GOWN STRL REUS W/ TWL LRG LVL3 (GOWN DISPOSABLE) ×6 IMPLANT
GOWN STRL REUS W/ TWL XL LVL3 (GOWN DISPOSABLE) ×1 IMPLANT
GOWN STRL REUS W/TWL LRG LVL3 (GOWN DISPOSABLE) ×15
GOWN STRL REUS W/TWL XL LVL3 (GOWN DISPOSABLE) ×3
HEMOSTAT SPONGE AVITENE ULTRA (HEMOSTASIS) IMPLANT
KIT BASIN OR (CUSTOM PROCEDURE TRAY) ×3 IMPLANT
KIT TURNOVER KIT B (KITS) ×3 IMPLANT
LOOP VESSEL MAXI BLUE (MISCELLANEOUS) ×2 IMPLANT
LOOP VESSEL MINI RED (MISCELLANEOUS) ×1 IMPLANT
MARKER SKIN DUAL TIP RULER LAB (MISCELLANEOUS) ×1 IMPLANT
NDL HYPO 25GX1X1/2 BEV (NEEDLE) ×1 IMPLANT
NEEDLE HYPO 25GX1X1/2 BEV (NEEDLE) ×3 IMPLANT
NS IRRIG 1000ML POUR BTL (IV SOLUTION) ×6 IMPLANT
PACK CV ACCESS (CUSTOM PROCEDURE TRAY) ×3 IMPLANT
PACK PERIPHERAL VASCULAR (CUSTOM PROCEDURE TRAY) ×3 IMPLANT
PAD ARMBOARD 7.5X6 YLW CONV (MISCELLANEOUS) ×6 IMPLANT
SET COLLECT BLD 21X3/4 12 (NEEDLE) IMPLANT
SPONGE LAP 18X18 RF (DISPOSABLE) ×1 IMPLANT
SPONGE SURGIFOAM ABS GEL 100 (HEMOSTASIS) IMPLANT
STOPCOCK 4 WAY LG BORE MALE ST (IV SETS) IMPLANT
SUT PROLENE 5 0 C 1 24 (SUTURE) ×5 IMPLANT
SUT PROLENE 6 0 CC (SUTURE) ×7 IMPLANT
SUT PROLENE 7 0 BV 1 (SUTURE) ×1 IMPLANT
SUT PROLENE 7 0 BV1 MDA (SUTURE) IMPLANT
SUT SILK 2 0 SH (SUTURE) ×3 IMPLANT
SUT SILK 3 0 (SUTURE) ×3
SUT SILK 3-0 18XBRD TIE 12 (SUTURE) IMPLANT
SUT VIC AB 2-0 SH 27 (SUTURE) ×6
SUT VIC AB 2-0 SH 27XBRD (SUTURE) ×4 IMPLANT
SUT VIC AB 3-0 SH 27 (SUTURE) ×15
SUT VIC AB 3-0 SH 27X BRD (SUTURE) ×8 IMPLANT
SUT VIC AB 4-0 PS2 27 (SUTURE) ×8 IMPLANT
SUT VICRYL 4-0 PS2 18IN ABS (SUTURE) ×3 IMPLANT
SYR 20CC LL (SYRINGE) ×3 IMPLANT
TAPE UMBILICAL COTTON 1/8X30 (MISCELLANEOUS) ×5 IMPLANT
TOWEL GREEN STERILE (TOWEL DISPOSABLE) ×3 IMPLANT
TRAY FOLEY MTR SLVR 16FR STAT (SET/KITS/TRAYS/PACK) ×3 IMPLANT
TUBE CONNECTING 20X1/4 (TUBING) ×2 IMPLANT
TUBING EXTENTION W/L.L. (IV SETS) IMPLANT
UNDERPAD 30X30 (UNDERPADS AND DIAPERS) ×3 IMPLANT
WATER STERILE IRR 1000ML POUR (IV SOLUTION) ×3 IMPLANT

## 2018-10-31 NOTE — Progress Notes (Signed)
ANTICOAGULATION CONSULT NOTE - Follow Up Consult  Pharmacy Consult for Heparin Indication: afib and mechanical valve  Allergies  Allergen Reactions  . Codeine Nausea And Vomiting  . Morphine And Related Nausea And Vomiting  . Percocet [Oxycodone-Acetaminophen] Nausea And Vomiting    Patient Measurements: Height: 5\' 11"  (180.3 cm) Weight: 232 lb 5.8 oz (105.4 kg) IBW/kg (Calculated) : 75.3 Heparin Dosing Weight: 97.4 kg  Vital Signs: Temp: 97.9 F (36.6 C) (03/02 0744) Temp Source: Oral (03/02 0744) BP: 95/56 (03/02 0744) Pulse Rate: 45 (03/02 0744)  Labs: Recent Labs    10/29/18 0019  10/30/18 0313 10/30/18 1338 10/31/18 0429  HGB 12.1*  --  13.3  --  11.4*  HCT 35.6*  --  39.6  --  35.5*  PLT 152  --  180  --  148*  HEPARINUNFRC 0.32   < > 0.73* 0.45 0.41  CREATININE 0.97  --   --   --   --    < > = values in this interval not displayed.    Estimated Creatinine Clearance: 106.3 mL/min (by C-G formula based on SCr of 0.97 mg/dL).   Assessment: Anticoag: Coumadin PTA for hx mech AVR + Afib, LD 2/25.  Switch to Heparin for bypass graft to RUE. HL 0.41 in goal. Hgb 12.1>13.3>11.4. Plts 148 stable.  *INR goal 3.5-4.5 per [facility] clinic (patient and [facility] clinic do not know why INR goal is higher than usual since 2012) *home dose: 10mg  daily except 5mg  on Fri  Goal of Therapy:  Heparin level 0.3-0.7 units/ml Monitor platelets by anticoagulation protocol: Yes   Plan:  Continue IV heparin at 1300 units/hr  Daily heparin level and CBC R upper extremity bypass planned for tomorrow. F/u [facility] plans after surgery    Zuley Lutter S. Merilynn Finland, PharmD, BCPS Clinical Staff Pharmacist [email or web address] Merilynn Finland, Ly Bacchi Stillinger 10/31/2018,9:12 AM

## 2018-10-31 NOTE — Op Note (Signed)
Procedure: Right brachial artery to brachial artery bypass using reversed left greater saphenous vein  Preoperative diagnosis: Vein graft degeneration right arm  Postoperative diagnosis: Same  Anesthesia: General  Assistant: Clotilde Dieter, MD, Doreatha Massed, PA-C, Lemmie Evens, NP  Operative findings: #1 3 mm greater saphenous vein  #2 heavily calcified pre-existing brachial artery bypass graft  Operative details: After obtaining form consent, the patient was taken the operating.  The patient was placed in supine position operating table.  After induction general anesthesia endotracheal ovation, both lower extremities and the right upper extremity was prepped and draped in usual sterile fashion.  Longitudinal incision was made in the left groin carried down to the subtendinous tissues down the level of the left greater saphenous vein.  This is approximately 3 mm in diameter.  It was harvested through 2 incisions on the medial aspect of the left leg.  Approximately 20 cm of vein was harvested.  It was ligated and divided tween silk ties proximally and distally after side branches have been ligated between silk ties and clips.  Was thoroughly flushed with heparinized saline put aside.  The leg was then closed with multiple layers with running 3-0 Vicryl suture and 4-0 Vicryl subcuticular stitch in the skin.  Attention was turned to the right arm.  Longitudinal incision was made through a pre-existing scar on the medial aspect of the right arm carried on through the subcutaneous tissues down the level of the brachial artery a few centimeters below where the previous anastomosis have been constructed distally.  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  I was able to dissect out most of the anterior two thirds surface of it.  However the medial portion of the vein graft was densely adhered to the median nerve so this was left  intact.  An additional incision was made above the level of the prior vein graft.  On the subcutaneous tissues down the level of the brachial artery.  This had a good quality pulse within it.  Median nerve was protected in this location.  A subfascial tunnel was created connecting the upper arm incision to the lower arm incision.  An umbilical tape was placed through this.  The patient was given 10,000s of intravenous heparin.  The brachial artery was controlled proximally and distally in the upper arm.  A longitudinal opening was made in the brachial artery the vein was placed in reverse configuration and sewn end of vein to side of artery using a running 6-0 Prolene suture.  Just prior to completion of anastomosis it was for blood backbled and thoroughly flushed.  Anastomosis was secured clamps released there is good pulsatile flow in the graft immediately.  It was then marked for orientation.  The distal portion of the brachial artery was then controlled proximally distally with fine below clamps.  Longitudinal opening was made in the brachial artery the vein spatulated and sewn end of vein to side of artery using a running 6-0 Prolene suture.  Prior to completion anastomosis it was for blood backbled and thoroughly flushed.  Anastomosis was secured clamps released there is good pulsatile flow in the vein graft immediately.  Patient had good ulnar and radial Doppler flow which was biphasic in character.  Initially there was some competing flow from the native circulation in order to prevent distal embolization and decompress the aneurysm the artery was ligated just above the distal anastomosis and just distal to  the proximal anastomosis.  I then proceeded to open the aneurysm to fully decompress this.  It was heavily calcified and there was some bleeding from this as vascular clamps did not clamp this very well due to the dense calcification.  The distal end was oversewn with a running 5-0 Prolene suture.  The  vein graft was then divided and the proximal end of this also oversewn with a running 5-0 Prolene suture.  Hemostasis was obtained.  The patient was given 50 mg of protamine.  The subcutaneous tissues of both incisions were reapproximated using a running 3-0 Vicryl suture.  The skin of both incisions was closed with a 4-0 Vicryl subcuticular stitch.  Patient tolerated procedure well and there were no complications.  Dermabond was applied all the incisions.  The patient had a palpable radial pulse and brachial pulse at the conclusion of the case in the right arm.  Patient was taken recovery room in stable condition.  Fabienne Bruns, MD [facility] [place] [facility] Office: [phone] Pager: [phone]

## 2018-10-31 NOTE — Transfer of Care (Signed)
Immediate Anesthesia Transfer of Care Note  Patient: Mark [name]  Procedure(s) Performed: REVISION OF RIGHT UPPER EXTREMITY BYPASS GRAFT WITH LEFT SAPHENOUS VEIN HARVEST (Right Arm Upper) LEFT SAPHENOUS VEIN HARVEST (Left Leg Upper)  Patient Location: PACU  Anesthesia Type:General  Level of Consciousness: awake, alert  and oriented  Airway & Oxygen Therapy: Patient Spontanous Breathing and Patient connected to face mask oxygen  Post-op Assessment: Report given to RN and Post -op Vital signs reviewed and stable  Post vital signs: Reviewed and stable  Last Vitals:  Vitals Value Taken Time  BP 117/68 10/31/2018  4:05 PM  Temp    Pulse 74 10/31/2018  4:12 PM  Resp 20 10/31/2018  4:12 PM  SpO2 94 % 10/31/2018  4:12 PM  Vitals shown include unvalidated device data.  Last Pain:  Vitals:   10/31/18 0744  TempSrc: Oral  PainSc: 0-No pain         Complications: No apparent anesthesia complications

## 2018-10-31 NOTE — Anesthesia Procedure Notes (Signed)
Procedure Name: Intubation Date/Time: 10/31/2018 12:54 PM Performed by: Teressa Lower., CRNA Pre-anesthesia Checklist: Patient identified, Emergency Drugs available, Suction available and Patient being monitored Patient Re-evaluated:Patient Re-evaluated prior to induction Oxygen Delivery Method: Circle system utilized Preoxygenation: Pre-oxygenation with 100% oxygen Induction Type: IV induction Ventilation: Mask ventilation without difficulty and Oral airway inserted - appropriate to patient size Laryngoscope Size: 4 and Mac Grade View: Grade I Tube type: Oral Tube size: 8.0 mm Number of attempts: 1 Airway Equipment and Method: Stylet and Oral airway Placement Confirmation: ETT inserted through vocal cords under direct vision,  positive ETCO2 and breath sounds checked- equal and bilateral Secured at: 23 cm Tube secured with: Tape Dental Injury: Teeth and Oropharynx as per pre-operative assessment

## 2018-10-31 NOTE — Anesthesia Preprocedure Evaluation (Addendum)
Anesthesia Evaluation  Patient identified by MRN, date of birth, ID band Patient awake    Reviewed: Allergy & Precautions, NPO status , Patient's Chart, lab work & pertinent test results  Airway Mallampati: II  TM Distance: >3 FB Neck ROM: Full    Dental  (+) Dental Advisory Given   Pulmonary sleep apnea ,    Pulmonary exam normal breath sounds clear to auscultation       Cardiovascular hypertension, + CAD and + Peripheral Vascular Disease  Normal cardiovascular exam+ dysrhythmias Atrial Fibrillation + pacemaker + Valvular Problems/Murmurs AS  Rhythm:Regular Rate:Normal  Echo 01/2018 Study Conclusions  - Left ventricle: The cavity size was normal. Wall thickness was increased in a pattern of mild LVH. Systolic function was normal. The estimated ejection fraction was in the range of 60% to 65%. - Aortic valve: AV prosthesis is difficult to see Peak and mean  gradients through the valvea er 69 and 30 mm Hg respectively. (No  signiicant change from gradients in July 2018 when gradients were  50 and 26 mm Hg)   Neuro/Psych  Headaches, negative psych ROS   GI/Hepatic negative GI ROS, (+) Hepatitis -  Endo/Other  negative endocrine ROS  Renal/GU negative Renal ROS     Musculoskeletal negative musculoskeletal ROS (+)   Abdominal   Peds  Hematology negative hematology ROS (+)   Anesthesia Other Findings   Reproductive/Obstetrics                                                             Anesthesia Evaluation  Patient identified by MRN, date of birth, ID band Patient awake    Reviewed: Allergy & Precautions, H&P , NPO status , Patient's Chart, lab work & pertinent test results  Airway Mallampati: II TM Distance: >3 FB Neck ROM: Full    Dental no notable dental hx. (+) Teeth Intact and Dental Advisory Given   Pulmonary sleep apnea ,  breath sounds clear to auscultation  Pulmonary exam  normal       Cardiovascular hypertension, On Medications + Peripheral Vascular Disease - CAD + dysrhythmias Atrial Fibrillation + Valvular Problems/Murmurs Rhythm:Regular Rate:Normal  H/o AVR   Neuro/Psych  Headaches, PSYCHIATRIC DISORDERS    GI/Hepatic negative GI ROS, (+) Hepatitis -, A  Endo/Other  negative endocrine ROS  Renal/GU negative Renal ROS  negative genitourinary   Musculoskeletal   Abdominal   Peds  Hematology negative hematology ROS (+)   Anesthesia Other Findings   Reproductive/Obstetrics negative OB ROS                           Anesthesia Physical Anesthesia Plan  ASA: III  Anesthesia Plan: General   Post-op Pain Management:    Induction: Intravenous  Airway Management Planned: Oral ETT  Additional Equipment:   Intra-op Plan:   Post-operative Plan: Extubation in OR  Informed Consent: I have reviewed the patients History and Physical, chart, labs and discussed the procedure including the risks, benefits and alternatives for the proposed anesthesia with the patient or authorized representative who has indicated his/her understanding and acceptance.   Dental advisory given  Plan Discussed with: CRNA  Anesthesia Plan Comments:         Anesthesia Quick Evaluation  Anesthesia Physical Anesthesia Plan  ASA: IV  Anesthesia Plan: General   Post-op Pain Management:    Induction: Intravenous  PONV Risk Score and Plan: 3 and Ondansetron, Dexamethasone, Midazolam and Treatment may vary due to age or medical condition  Airway Management Planned: LMA  Additional Equipment: None  Intra-op Plan:   Post-operative Plan: Extubation in OR  Informed Consent: I have reviewed the patients History and Physical, chart, labs and discussed the procedure including the risks, benefits and alternatives for the proposed anesthesia with the patient or authorized representative who has indicated his/her understanding and  acceptance.     Dental advisory given  Plan Discussed with: CRNA  Anesthesia Plan Comments:         Anesthesia Quick Evaluation

## 2018-10-31 NOTE — Interval H&P Note (Signed)
History and Physical Interval Note:  10/31/2018 12:01 PM  Mark [name]  has presented today for surgery, with the diagnosis of ANEURYSM RIGHT UPPER EXTREMITY  The various methods of treatment have been discussed with the patient and family. After consideration of risks, benefits and other options for treatment, the patient has consented to  Procedure(s): REVISION OF RIGHT UPPER EXTREMITY BYPASS GRAFT WITH LEFT SAPHENOUS VEIN HARVEST (Right) LEFT SAPHENOUS VEIN (Left) as a surgical intervention .  The patient's history has been reviewed, patient examined, no change in status, stable for surgery.  I have reviewed the patient's chart and labs.  Questions were answered to the patient's satisfaction.     Fabienne Bruns

## 2018-10-31 NOTE — Progress Notes (Signed)
Pt received from PACU. Pt has good radial pulse tp R extremity and good pedal to  LLE. Incisions clean dry and intact.Pt states pain is a 10. CHG bath given tele notified.Vitals stable. Will continue to monitor.   Lacy Duverney, RN

## 2018-11-01 ENCOUNTER — Encounter (HOSPITAL_COMMUNITY): Payer: Self-pay | Admitting: Vascular Surgery

## 2018-11-01 LAB — CBC
HCT: 30.6 % — ABNORMAL LOW (ref 39.0–52.0)
Hemoglobin: 10.1 g/dL — ABNORMAL LOW (ref 13.0–17.0)
MCH: 30.4 pg (ref 26.0–34.0)
MCHC: 33 g/dL (ref 30.0–36.0)
MCV: 92.2 fL (ref 80.0–100.0)
Platelets: 158 10*3/uL (ref 150–400)
RBC: 3.32 MIL/uL — ABNORMAL LOW (ref 4.22–5.81)
RDW: 12.1 % (ref 11.5–15.5)
WBC: 11.7 10*3/uL — ABNORMAL HIGH (ref 4.0–10.5)
nRBC: 0 % (ref 0.0–0.2)

## 2018-11-01 LAB — HEPARIN LEVEL (UNFRACTIONATED): Heparin Unfractionated: 0.39 IU/mL (ref 0.30–0.70)

## 2018-11-01 MED ORDER — HEPARIN (PORCINE) 25000 UT/250ML-% IV SOLN
1500.0000 [IU]/h | INTRAVENOUS | Status: DC
  Administered 2018-11-01 – 2018-11-03 (×3): 1300 [IU]/h via INTRAVENOUS
  Administered 2018-11-03: 1250 [IU]/h via INTRAVENOUS
  Administered 2018-11-04: 1300 [IU]/h via INTRAVENOUS
  Administered 2018-11-05: 1500 [IU]/h via INTRAVENOUS
  Filled 2018-11-01 (×7): qty 250

## 2018-11-01 MED ORDER — WARFARIN - PHARMACIST DOSING INPATIENT: Freq: Every day | Status: DC

## 2018-11-01 MED ORDER — WARFARIN SODIUM 2.5 MG PO TABS
12.5000 mg | ORAL_TABLET | Freq: Once | ORAL | Status: AC
  Administered 2018-11-01: 12.5 mg via ORAL
  Filled 2018-11-01: qty 1

## 2018-11-01 NOTE — Progress Notes (Signed)
ANTICOAGULATION CONSULT NOTE - Initial Consult  Pharmacy Consult for Heparin Indication: Mechanical Valve  Allergies  Allergen Reactions  . Codeine Nausea And Vomiting  . Morphine And Related Nausea And Vomiting  . Percocet [Oxycodone-Acetaminophen] Nausea And Vomiting    Patient Measurements: Height: 5\' 11"  (180.3 cm) Weight: 232 lb 5.8 oz (105.4 kg) IBW/kg (Calculated) : 75.3 Heparin Dosing Weight: 97.4kg  Vital Signs: Temp: 98 F (36.7 C) (03/03 0829) Temp Source: Oral (03/03 0829) BP: 124/87 (03/03 0829) Pulse Rate: 53 (03/03 1250)  Labs: Recent Labs    10/30/18 0313 10/30/18 1338 10/31/18 0429 11/01/18 0453 11/01/18 1600  HGB 13.3  --  11.4* 10.1*  --   HCT 39.6  --  35.5* 30.6*  --   PLT 180  --  148* 158  --   HEPARINUNFRC 0.73* 0.45 0.41  --  0.39    Estimated Creatinine Clearance: 106.3 mL/min (by C-G formula based on SCr of 0.97 mg/dL).   Medical History: Past Medical History:  Diagnosis Date  . Aortic stenosis    a. due to bicuspid AoV; 1983 S/p mechanical AVR ([employer]) - chronic coumadin with INR run between 3.5-4.5;  b. 03/2012 Echo: EF 60%, nl wall motion, mod dil LA.  Marland Kitchen Ascending aortic aneurysm (HCC)   . Atrial tachycardia (HCC)    a. admitted 07/2012  . Coronary artery disease   . ED (erectile dysfunction)   . Gallstones   . H/O cardiac catheterization    a. 12/2007 Cath: nl cors.  . Headache(784.0)   . 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 Hypertension   . OSA (obstructive sleep apnea)    a. noncompliant with CPAP  . Paroxysmal atrial fibrillation (HCC)    s/p afib ablation x 2    Assessment: 31 YOM with hx of mech AVR+afib with heparin restarted s/p vascular surgery, initial heparin level is therapeutic at 0.39. Goal of Therapy:  Heparin level 0.3-0.7 units/ml Monitor platelets by anticoagulation protocol: Yes   Plan:  Continue heparin gtt at 1300 units/hr Daily heparin level, CBC, s/s bleeding  Daylene Posey,  PharmD Clinical Pharmacist Please check AMION for all [facility] Pharmacy numbers 11/01/2018 5:57 PM

## 2018-11-01 NOTE — Anesthesia Postprocedure Evaluation (Signed)
Anesthesia Post Note  Patient: Mark [name]  Procedure(s) Performed: REVISION OF RIGHT UPPER EXTREMITY BYPASS GRAFT WITH LEFT SAPHENOUS VEIN HARVEST (Right Arm Upper) LEFT SAPHENOUS VEIN HARVEST (Left Leg Upper)     Patient location during evaluation: PACU Anesthesia Type: General Level of consciousness: sedated and patient cooperative Pain management: pain level controlled Vital Signs Assessment: post-procedure vital signs reviewed and stable Respiratory status: spontaneous breathing Cardiovascular status: stable Anesthetic complications: no    Last Vitals:  Vitals:   11/01/18 1700 11/01/18 1830  BP:  124/76  Pulse: 62 62  Resp: 11 20  Temp:  36.7 C  SpO2: (!) 89% 98%    Last Pain:  Vitals:   11/01/18 1830  TempSrc: Oral  PainSc:    Pain Goal: Patients Stated Pain Goal: 0 (11/01/18 1250)                 Lewie Loron

## 2018-11-01 NOTE — Progress Notes (Addendum)
[facility]   Subjective  - Doing well with pain in the arm is the limiting factor.    Objective 113/74 (!) 54 98 F (36.7 C) (Oral) 16 97%  Intake/Output Summary (Last 24 hours) at 11/01/2018 0756 Last data filed at 11/01/2018 0659 Gross per 24 hour  Intake 6810.9 ml  Output 1600 ml  Net 5210.9 ml    Right radial pulse palpable, incision healing well Edema in the right hane encouraged elevation and movement. Left thigh healing well from vein harvest site.  + ecchymosis, soft without hematoma. Heart RRR Lungs non labored breathing  Assessment/Planning: POD # 1 Right brachial artery to brachial artery bypass using reversed left greater saphenous vein  HGB stable will re start Heparin today for mechanical valve.  Will hold Coumadin for now per DR. Giancarlo Askren.  Encourage elevation and active motion of the right hand to promote reducing edema.    Mosetta Pigeon 11/01/2018 7:56 AM --   2+ radial pulse All incisions clean Restart heparin today Coumadin tomorrow if no hematoma  Fabienne Bruns, MD [facility] [place] Office: [phone] Pager: [phone]  Laboratory Lab Results: Recent Labs    10/31/18 0429 11/01/18 0453  WBC 6.0 11.7*  HGB 11.4* 10.1*  HCT 35.5* 30.6*  PLT 148* 158   BMET No results for input(s): NA, K, CL, CO2, GLUCOSE, BUN, CREATININE, CALCIUM in the last 72 hours.  COAG Lab Results  Component Value Date   INR 1.5 (H) 10/28/2018   INR 1.6 (H) 10/27/2018   INR 4.2 (A) 10/14/2018   PROTIME 18.5 01/25/2009   No results found for: PTT

## 2018-11-01 NOTE — Progress Notes (Signed)
ANTICOAGULATION CONSULT NOTE - Initial Consult  Pharmacy Consult for Heparin/Coumadin Indication: mechanical valve  Allergies  Allergen Reactions  . Codeine Nausea And Vomiting  . Morphine And Related Nausea And Vomiting  . Percocet [Oxycodone-Acetaminophen] Nausea And Vomiting    Patient Measurements: Height: 5\' 11"  (180.3 cm) Weight: 232 lb 5.8 oz (105.4 kg) IBW/kg (Calculated) : 75.3 Heparin Dosing Weight:  97.4 kg  Vital Signs: Temp: 98 F (36.7 C) (03/03 0829) Temp Source: Oral (03/03 0829) BP: 124/87 (03/03 0829) Pulse Rate: 53 (03/03 0829)  Labs: Recent Labs    10/30/18 0313 10/30/18 1338 10/31/18 0429 11/01/18 0453  HGB 13.3  --  11.4* 10.1*  HCT 39.6  --  35.5* 30.6*  PLT 180  --  148* 158  HEPARINUNFRC 0.73* 0.45 0.41  --     Estimated Creatinine Clearance: 106.3 mL/min (by C-G formula based on SCr of 0.97 mg/dL).   Medical History: Past Medical History:  Diagnosis Date  . Aortic stenosis    a. due to bicuspid AoV; 1983 S/p mechanical AVR ([employer] Jude) - chronic coumadin with INR run between 3.5-4.5;  b. 03/2012 Echo: EF 60%, nl wall motion, mod dil LA.  Marland Kitchen Ascending aortic aneurysm (HCC)   . Atrial tachycardia (HCC)    a. admitted 07/2012  . Coronary artery disease   . ED (erectile dysfunction)   . Gallstones   . H/O cardiac catheterization    a. 12/2007 Cath: nl cors.  . Headache(784.0)   . 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 Hypertension   . OSA (obstructive sleep apnea)    a. noncompliant with CPAP  . Paroxysmal atrial fibrillation (HCC)    s/p afib ablation x 2    Assessment: 3/2: Right brachial artery to brachial artery bypass using reversed left greater saphenous vein  Anticoag: Coumadin PTA for hx mech AVR + Afib, LD 2/25.  Switch to Heparin for bypass graft to RUE. HL 0.41 in goal. Hgb 12.1>13.3>11.4>10.1 post-op. Plts 158 stable. Resume hep/warf 3/3 AM.  *INR goal 3.5-4.5 per [facility] clinic (patient and [facility] clinic do not know why  INR goal is higher than usual since 2012) *home dose: 10mg  daily except 5mg  on Fri  Goal of Therapy:  Heparin level 0.3-0.7 units/ml Monitor platelets by anticoagulation protocol: Yes  INR 3.5-4.5   Plan:  Resume IV heparin at 1300 units/hr. Verify level in 6 hrs Daily heparin level and CBC and INR Coumadin 12.5mg  po x 1 tonight   Donyel Nester S. Merilynn Finland, PharmD, BCPS Clinical Staff Pharmacist [email or web address] Merilynn Finland, [employer] 11/01/2018,8:50 AM

## 2018-11-02 LAB — CBC
HCT: 26.1 % — ABNORMAL LOW (ref 39.0–52.0)
Hemoglobin: 8.6 g/dL — ABNORMAL LOW (ref 13.0–17.0)
MCH: 30.9 pg (ref 26.0–34.0)
MCHC: 33 g/dL (ref 30.0–36.0)
MCV: 93.9 fL (ref 80.0–100.0)
Platelets: 124 10*3/uL — ABNORMAL LOW (ref 150–400)
RBC: 2.78 MIL/uL — ABNORMAL LOW (ref 4.22–5.81)
RDW: 12.5 % (ref 11.5–15.5)
WBC: 10.6 10*3/uL — ABNORMAL HIGH (ref 4.0–10.5)
nRBC: 0 % (ref 0.0–0.2)

## 2018-11-02 LAB — HEPARIN LEVEL (UNFRACTIONATED): Heparin Unfractionated: 0.41 IU/mL (ref 0.30–0.70)

## 2018-11-02 LAB — PROTIME-INR
INR: 1.1 (ref 0.8–1.2)
Prothrombin Time: 14.5 seconds (ref 11.4–15.2)

## 2018-11-02 MED ORDER — WARFARIN - PHYSICIAN DOSING INPATIENT: Freq: Every day | Status: DC

## 2018-11-02 MED ORDER — WARFARIN SODIUM 5 MG PO TABS
10.0000 mg | ORAL_TABLET | ORAL | Status: DC
  Administered 2018-11-02 – 2018-11-05 (×3): 10 mg via ORAL
  Filled 2018-11-02 (×3): qty 2

## 2018-11-02 MED ORDER — WARFARIN SODIUM 5 MG PO TABS
5.0000 mg | ORAL_TABLET | ORAL | Status: AC
  Administered 2018-11-04: 5 mg via ORAL
  Filled 2018-11-02: qty 1

## 2018-11-02 NOTE — Progress Notes (Signed)
[facility] [place]  Subjective  - feels ok   Objective 123/81 (!) 50 98.1 F (36.7 C) (Oral) 13 100%  Intake/Output Summary (Last 24 hours) at 11/02/2018 0816 Last data filed at 11/01/2018 1500 Gross per 24 hour  Intake 2254.98 ml  Output 800 ml  Net 1454.98 ml    Left leg with some ecchymosis no hematoma Right arm some edema no focal hematoma 2+ radial artery right arm  Assessment/Planning: POD 2 right arm bypass Patent bypass Continue heparin restart warfarin  Fabienne Bruns 11/02/2018 8:16 AM --  Laboratory Lab Results: Recent Labs    11/01/18 0453 11/02/18 0430  WBC 11.7* 10.6*  HGB 10.1* 8.6*  HCT 30.6* 26.1*  PLT 158 124*   BMET No results for input(s): NA, K, CL, CO2, GLUCOSE, BUN, CREATININE, CALCIUM in the last 72 hours.  COAG Lab Results  Component Value Date   INR 1.5 (H) 10/28/2018   INR 1.6 (H) 10/27/2018   INR 4.2 (A) 10/14/2018   PROTIME 18.5 01/25/2009   No results found for: PTT

## 2018-11-02 NOTE — Discharge Instructions (Signed)

## 2018-11-02 NOTE — Progress Notes (Signed)
ANTICOAGULATION CONSULT NOTE - Initial Consult  Pharmacy Consult for Heparin Indication: Mechanical Valve  Patient Measurements: Height: 5\' 11"  (180.3 cm) Weight: 232 lb 5.8 oz (105.4 kg) IBW/kg (Calculated) : 75.3 Heparin Dosing Weight: 97.4kg  Vital Signs: Temp: 98.1 F (36.7 C) (03/04 0446) Temp Source: Oral (03/04 0446) BP: 123/81 (03/04 0446) Pulse Rate: 50 (03/04 0446)  Labs: Recent Labs    10/31/18 0429 11/01/18 0453 11/01/18 1600 11/02/18 0430  HGB 11.4* 10.1*  --  8.6*  HCT 35.5* 30.6*  --  26.1*  PLT 148* 158  --  124*  HEPARINUNFRC 0.41  --  0.39 0.41     Medical History: Past Medical History:  Diagnosis Date  . Aortic stenosis    a. due to bicuspid AoV; 1983 S/p mechanical AVR (St. Jude) - chronic coumadin with INR run between 3.5-4.5;  b. 03/2012 Echo: EF 60%, nl wall motion, mod dil LA.  Marland Kitchen Ascending aortic aneurysm (HCC)   . Atrial tachycardia (HCC)    a. admitted 07/2012  . Coronary artery disease   . ED (erectile dysfunction)   . Gallstones   . H/O cardiac catheterization    a. 12/2007 Cath: nl cors.  . Headache(784.0)   . 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 Hypertension   . OSA (obstructive sleep apnea)    a. noncompliant with CPAP  . Paroxysmal atrial fibrillation (HCC)    s/p afib ablation x 2    Assessment: 58 year old male with hx of mech AVR+afib with heparin restarted s/p vascular surgery. Heparin level remains therapeutic. hgb and plts both down today hgb 13.3 > 8.6 over past couple days - ?acute blood loss anemia.    Goal of Therapy:  Heparin level 0.3-0.7 units/ml Monitor platelets by anticoagulation protocol: Yes    Plan:  Continue heparin gtt at 1300 units/hr Daily heparin level, CBC, s/s bleeding F/u transition to warfarin    Baldemar Friday 11/02/2018 8:09 AM

## 2018-11-03 ENCOUNTER — Ambulatory Visit (INDEPENDENT_AMBULATORY_CARE_PROVIDER_SITE_OTHER): Payer: Managed Care, Other (non HMO) | Admitting: *Deleted

## 2018-11-03 DIAGNOSIS — I4891 Unspecified atrial fibrillation: Secondary | ICD-10-CM

## 2018-11-03 LAB — HEPARIN LEVEL (UNFRACTIONATED)
Heparin Unfractionated: 0.71 IU/mL — ABNORMAL HIGH (ref 0.30–0.70)
Heparin Unfractionated: 0.25 IU/mL — ABNORMAL LOW (ref 0.30–0.70)
Heparin Unfractionated: 0.38 IU/mL (ref 0.30–0.70)

## 2018-11-03 LAB — CBC
HCT: 27.8 % — ABNORMAL LOW (ref 39.0–52.0)
Hemoglobin: 9.1 g/dL — ABNORMAL LOW (ref 13.0–17.0)
MCH: 30.7 pg (ref 26.0–34.0)
MCHC: 32.7 g/dL (ref 30.0–36.0)
MCV: 93.9 fL (ref 80.0–100.0)
Platelets: 142 10*3/uL — ABNORMAL LOW (ref 150–400)
RBC: 2.96 MIL/uL — ABNORMAL LOW (ref 4.22–5.81)
RDW: 12.7 % (ref 11.5–15.5)
WBC: 9.3 10*3/uL (ref 4.0–10.5)
nRBC: 0 % (ref 0.0–0.2)

## 2018-11-03 LAB — PROTIME-INR
INR: 1.3 — ABNORMAL HIGH (ref 0.8–1.2)
Prothrombin Time: 16.4 seconds — ABNORMAL HIGH (ref 11.4–15.2)

## 2018-11-03 MED ORDER — ONDANSETRON HCL 4 MG/2ML IJ SOLN
4.0000 mg | Freq: Four times a day (QID) | INTRAMUSCULAR | Status: DC | PRN
  Administered 2018-11-03 – 2018-11-04 (×3): 4 mg via INTRAVENOUS
  Filled 2018-11-03 (×5): qty 2

## 2018-11-03 NOTE — Progress Notes (Signed)
[facility] [place]  Subjective  - no complaints some ooze from saphenecotomy   Objective 114/70 (!) 48 98.2 F (36.8 C) (Oral) 13 98%  Intake/Output Summary (Last 24 hours) at 11/03/2018 0756 Last data filed at 11/02/2018 2238 Gross per 24 hour  Intake 402.38 ml  Output -  Net 402.38 ml   2+ radial pulse Minor oozing from saphenectomy left leg  Assessment/Planning: S/p right arm bypass left leg vein  Cointinue warfarin for valves Will consider stopping heparin if ooze persists.  Ace wrap thigh for now  Home when INR > 2  Fabienne Bruns 11/03/2018 7:56 AM --  Laboratory Lab Results: Recent Labs    11/02/18 0430 11/03/18 0311  WBC 10.6* 9.3  HGB 8.6* 9.1*  HCT 26.1* 27.8*  PLT 124* 142*   BMET No results for input(s): NA, K, CL, CO2, GLUCOSE, BUN, CREATININE, CALCIUM in the last 72 hours.  COAG Lab Results  Component Value Date   INR 1.3 (H) 11/03/2018   INR 1.1 11/02/2018   INR 1.5 (H) 10/28/2018   PROTIME 18.5 01/25/2009   No results found for: PTT

## 2018-11-03 NOTE — Progress Notes (Signed)
Patient with episode of nausea and vomiting, Zofran given as ordered as needed for nausea/vomiting. Ever Gustafson, [name]

## 2018-11-03 NOTE — Progress Notes (Addendum)
ANTICOAGULATION CONSULT NOTE - Initial Consult  Pharmacy Consult for Heparin Indication: Mechanical Valve  Patient Measurements: Height: 5\' 11"  (180.3 cm) Weight: 232 lb 5.8 oz (105.4 kg) IBW/kg (Calculated) : 75.3 Heparin Dosing Weight: 97.4kg  Vital Signs: Temp: 98.2 F (36.8 C) (03/05 0424) Temp Source: Oral (03/05 0424) BP: 114/70 (03/05 0424) Pulse Rate: 48 (03/05 0424)  Labs: Recent Labs    11/01/18 0453 11/01/18 1600 11/02/18 0430 11/02/18 1210 11/03/18 0311  HGB 10.1*  --  8.6*  --  9.1*  HCT 30.6*  --  26.1*  --  27.8*  PLT 158  --  124*  --  142*  LABPROT  --   --   --  14.5 16.4*  INR  --   --   --  1.1 1.3*  HEPARINUNFRC  --  0.39 0.41  --  0.71*     Medical History: Past Medical History:  Diagnosis Date  . Aortic stenosis    a. due to bicuspid AoV; 1983 S/p mechanical AVR (St. Jude) - chronic coumadin with INR run between 3.5-4.5;  b. 03/2012 Echo: EF 60%, nl wall motion, mod dil LA.  Marland Kitchen Ascending aortic aneurysm (HCC)   . Atrial tachycardia (HCC)    a. admitted 07/2012  . Coronary artery disease   . ED (erectile dysfunction)   . Gallstones   . H/O cardiac catheterization    a. 12/2007 Cath: nl cors.  . Headache(784.0)   . 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 Hypertension   . OSA (obstructive sleep apnea)    a. noncompliant with CPAP  . Paroxysmal atrial fibrillation (HCC)    s/p afib ablation x 2    Assessment: 58 year old male with hx of mech AVR+afib with heparin restarted s/p vascular surgery.   Heparin level slightly supratherapeutic at 0.71, CBC improved, however bloody drainage this AM.  Goal of Therapy:  Heparin level 0.3-0.7 units/ml Monitor platelets by anticoagulation protocol: Yes    Plan:  Decrease heparin gtt to 1200 units/hr F/u 6 hour heparin level Monitor bleeding and need to d/c gtt per VVS  Daylene Posey, PharmD Clinical Pharmacist Please check AMION for all [facility] Pharmacy numbers 11/03/2018 8:00 AM    Addendum: 6 hour level subtherapeutic at 0.25, no line issues per RN report and no more bleeding.   Increase heparin gtt to 1250 units/hr F/u 6H heparin level  Daylene Posey, PharmD Clinical Pharmacist Please check AMION for all [facility] Pharmacy numbers 11/03/2018 3:05 PM

## 2018-11-03 NOTE — Progress Notes (Signed)
ANTICOAGULATION CONSULT NOTE - Initial Consult  Pharmacy Consult for Heparin Indication: Mechanical Valve  Patient Measurements: Height: 5\' 11"  (180.3 cm) Weight: 232 lb 5.8 oz (105.4 kg) IBW/kg (Calculated) : 75.3 Heparin Dosing Weight: 97.4kg  Vital Signs: Temp: 98.1 F (36.7 C) (03/05 2018) Temp Source: Oral (03/05 2018) BP: 129/67 (03/05 2018) Pulse Rate: 59 (03/05 2018)  Labs: Recent Labs    11/01/18 0453  11/02/18 0430 11/02/18 1210 11/03/18 0311 11/03/18 1419 11/03/18 2039  HGB 10.1*  --  8.6*  --  9.1*  --   --   HCT 30.6*  --  26.1*  --  27.8*  --   --   PLT 158  --  124*  --  142*  --   --   LABPROT  --   --   --  14.5 16.4*  --   --   INR  --   --   --  1.1 1.3*  --   --   HEPARINUNFRC  --    < > 0.41  --  0.71* 0.25* 0.38   < > = values in this interval not displayed.     Medical History: Past Medical History:  Diagnosis Date  . Aortic stenosis    a. due to bicuspid AoV; 1983 S/p mechanical AVR (St. Jude) - chronic coumadin with INR run between 3.5-4.5;  b. 03/2012 Echo: EF 60%, nl wall motion, mod dil LA.  Marland Kitchen Ascending aortic aneurysm (HCC)   . Atrial tachycardia (HCC)    a. admitted 07/2012  . Coronary artery disease   . ED (erectile dysfunction)   . Gallstones   . H/O cardiac catheterization    a. 12/2007 Cath: nl cors.  . Headache(784.0)   . 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 Hypertension   . OSA (obstructive sleep apnea)    a. noncompliant with CPAP  . Paroxysmal atrial fibrillation (HCC)    s/p afib ablation x 2    Assessment: 58 year old male with hx of mech AVR+afib with heparin restarted s/p vascular surgery.   Heparin level now therapeutic at 0.38.  Goal of Therapy:  Heparin level 0.3-0.7 units/ml Monitor platelets by anticoagulation protocol: Yes   Plan:  Continue heparin gtt at 1,250 units/hr Monitor daily heparin level, CBC, s/s of bleed  Enzo Bi, PharmD, BCPS, [facility] Clinical Pharmacist Phone number  [phone] 11/03/2018 9:11 PM

## 2018-11-03 NOTE — Progress Notes (Addendum)
Patient with episode of nausea and vomiting, zofran given as ordered as needed for nausea/vomiting .Jazae Gandolfi, [name]

## 2018-11-03 NOTE — Progress Notes (Signed)
Dressing to Left thigh saturated this AM with some clots, Dressing changed and Mark [name] PAC on floor and made aware in to see patient. Will continue to monitor patient. [name], [name]

## 2018-11-03 NOTE — Progress Notes (Signed)
Patient ambulated in hallway with family. Elaine Middleton Jessup RN  

## 2018-11-03 NOTE — Progress Notes (Signed)
Patient still with nausea, Lianne Cure [facility] made aware orders received. Will monitor patient. Doxie Augenstein, [name]

## 2018-11-03 NOTE — Progress Notes (Signed)
[facility]   Subjective  - Doing better with the edema and discomfort in his right arm.   Objective 114/70 (!) 48 98.2 F (36.8 C) (Oral) 13 98%  Intake/Output Summary (Last 24 hours) at 11/03/2018 0757 Last data filed at 11/02/2018 2238 Gross per 24 hour  Intake 402.38 ml  Output -  Net 402.38 ml    Palpable radial pulse, sensation and motor intact right UE Left thigh incision with mild bloody drainage will keep dry dressing in place and light compression with ace wrap for ambulation. Lungs non labored breathing  Assessment/Planning: POD # 3 right arm bypass  Bloody drainage form vein harvest site will maintain druy dressing and ace wrap for light compression Pending therapeutic INR before discharge.  INR 1.3 today   Mark [name] 11/03/2018 7:57 AM --  Laboratory Lab Results: Recent Labs    11/02/18 0430 11/03/18 0311  WBC 10.6* 9.3  HGB 8.6* 9.1*  HCT 26.1* 27.8*  PLT 124* 142*   BMET No results for input(s): NA, K, CL, CO2, GLUCOSE, BUN, CREATININE, CALCIUM in the last 72 hours.  COAG Lab Results  Component Value Date   INR 1.3 (H) 11/03/2018   INR 1.1 11/02/2018   INR 1.5 (H) 10/28/2018   PROTIME 18.5 01/25/2009   No results found for: PTT

## 2018-11-04 LAB — CBC
HCT: 26.7 % — ABNORMAL LOW (ref 39.0–52.0)
Hemoglobin: 8.8 g/dL — ABNORMAL LOW (ref 13.0–17.0)
MCH: 30.9 pg (ref 26.0–34.0)
MCHC: 33 g/dL (ref 30.0–36.0)
MCV: 93.7 fL (ref 80.0–100.0)
Platelets: 144 10*3/uL — ABNORMAL LOW (ref 150–400)
RBC: 2.85 MIL/uL — ABNORMAL LOW (ref 4.22–5.81)
RDW: 12.6 % (ref 11.5–15.5)
WBC: 8 10*3/uL (ref 4.0–10.5)
nRBC: 0.2 % (ref 0.0–0.2)

## 2018-11-04 LAB — CUP PACEART REMOTE DEVICE CHECK
Date Time Interrogation Session: 20200305203958
Implantable Pulse Generator Implant Date: 20180807

## 2018-11-04 LAB — PROTIME-INR
INR: 1.6 — ABNORMAL HIGH (ref 0.8–1.2)
Prothrombin Time: 18.4 seconds — ABNORMAL HIGH (ref 11.4–15.2)

## 2018-11-04 LAB — HEPARIN LEVEL (UNFRACTIONATED)
Heparin Unfractionated: 0.23 IU/mL — ABNORMAL LOW (ref 0.30–0.70)
Heparin Unfractionated: <0.1 IU/mL — ABNORMAL LOW (ref 0.30–0.70)
Heparin Unfractionated: 0.17 IU/mL — ABNORMAL LOW (ref 0.30–0.70)

## 2018-11-04 NOTE — Progress Notes (Signed)
ANTICOAGULATION CONSULT NOTE  Pharmacy Consult for Heparin Indication: Mechanical Valve  Patient Measurements: Height: 5\' 11"  (180.3 cm) Weight: 232 lb 5.8 oz (105.4 kg) IBW/kg (Calculated) : 75.3 Heparin Dosing Weight: 97.4kg  Vital Signs: Temp: 98.1 F (36.7 C) (03/06 0315) Temp Source: Oral (03/06 0315) BP: 116/75 (03/06 0856) Pulse Rate: 52 (03/06 0900)  Labs: Recent Labs    11/02/18 0430 11/02/18 1210 11/03/18 0311  11/03/18 2039 11/04/18 0306 11/04/18 1130  HGB 8.6*  --  9.1*  --   --  8.8*  --   HCT 26.1*  --  27.8*  --   --  26.7*  --   PLT 124*  --  142*  --   --  144*  --   LABPROT  --  14.5 16.4*  --   --  18.4*  --   INR  --  1.1 1.3*  --   --  1.6*  --   HEPARINUNFRC 0.41  --  0.71*   < > 0.38 0.23* <0.10*   < > = values in this interval not displayed.     Medical History: Past Medical History:  Diagnosis Date  . Aortic stenosis    a. due to bicuspid AoV; 1983 S/p mechanical AVR (St. Jude) - chronic coumadin with INR run between 3.5-4.5;  b. 03/2012 Echo: EF 60%, nl wall motion, mod dil LA.  Marland Kitchen Ascending aortic aneurysm (HCC)   . Atrial tachycardia (HCC)    a. admitted 07/2012  . Coronary artery disease   . ED (erectile dysfunction)   . Gallstones   . H/O cardiac catheterization    a. 12/2007 Cath: nl cors.  . Headache(784.0)   . 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 Hypertension   . OSA (obstructive sleep apnea)    a. noncompliant with CPAP  . Paroxysmal atrial fibrillation (HCC)    s/p afib ablation x 2    Assessment: 58 year old male with hx of mech AVR+afib with heparin restarted s/p vascular surgery.   Heparin level was undetectable (collected at 1130). Heparin infusion stopped at 1043 due to unable to draw back on midline IV site - was restarted at 1143. Will disregard level and order heparin level from time of restart. Hgb 8.8, plt 144. INR is 1.6 today. On home warfarin dosing per MD.   Home dose: 10mg  daily except 5mg  on  Fri  Goal of Therapy:  Heparin level 0.3-0.7 units/ml Monitor platelets by anticoagulation protocol: Yes   Plan:  Continue heparin gtt at 1,300 units/hr - will order heparin level 6 hours from restart Plan for warfarin 5 mg tonight per MD Monitor daily heparin level, CBC, s/s of bleed  Sherron Monday, PharmD, BCCCP Clinical Pharmacist  Pager: [phone] Phone: [phone] 11/04/2018 12:55 PM

## 2018-11-04 NOTE — Progress Notes (Addendum)
  Progress Note    11/04/2018 8:17 AM 4 Days Post-Op  Subjective:  No new complaints   Vitals:   11/03/18 2156 11/04/18 0315  BP: 138/78 109/75  Pulse:  62  Resp:  13  Temp:  98.1 F (36.7 C)  SpO2:  97%   Physical Exam: Lungs:  Non labored Incisions:  R arm incision c/d/i local ecchymosis but soft; L thigh incision no further bleeding, soft Extremities: palpable R radial pulse Neurologic: A&O  CBC    Component Value Date/Time   WBC 8.0 11/04/2018 0306   RBC 2.85 (L) 11/04/2018 0306   HGB 8.8 (L) 11/04/2018 0306   HGB 13.1 06/21/2017 0743   HCT 26.7 (L) 11/04/2018 0306   HCT 38.6 06/21/2017 0743   PLT 144 (L) 11/04/2018 0306   PLT 172 06/21/2017 0743   MCV 93.7 11/04/2018 0306   MCV 89 06/21/2017 0743   MCH 30.9 11/04/2018 0306   MCHC 33.0 11/04/2018 0306   RDW 12.6 11/04/2018 0306   RDW 13.0 06/21/2017 0743   LYMPHSABS 2.0 09/28/2018 1631   LYMPHSABS 1.8 06/21/2017 0743   MONOABS 0.5 09/28/2018 1631   EOSABS 0.2 09/28/2018 1631   EOSABS 0.1 06/21/2017 0743   BASOSABS 0.1 09/28/2018 1631   BASOSABS 0.0 06/21/2017 0743    BMET    Component Value Date/Time   NA 137 10/29/2018 0019   NA 142 06/21/2017 0743   K 4.1 10/29/2018 0019   CL 106 10/29/2018 0019   CO2 24 10/29/2018 0019   GLUCOSE 105 (H) 10/29/2018 0019   BUN 17 10/29/2018 0019   BUN 19 06/21/2017 0743   CREATININE 0.97 10/29/2018 0019   CALCIUM 8.7 (L) 10/29/2018 0019   GFRNONAA >60 10/29/2018 0019   GFRAA >60 10/29/2018 0019    INR    Component Value Date/Time   INR 1.6 (H) 11/04/2018 0306   INR 1.7 10/08/2010 0820     Intake/Output Summary (Last 24 hours) at 11/04/2018 0817 Last data filed at 11/04/2018 0346 Gross per 24 hour  Intake 950.72 ml  Output 1200 ml  Net -249.28 ml     Assessment/Plan:  58 y.o. male is s/p R arm bypass with L GSV vein 4 Days Post-Op   Palpable R radial Incisions unremarkable Continue IV heparin bridge with coumadin Ok for d/c home when  Garen Lah, PA-C [facility] [phone] 11/04/2018 8:17 AM  Agree with above Most likely d/c next 1-2 days when INR > 2  Fabienne Bruns, MD [facility] [place] Office: [phone] Pager: [phone]

## 2018-11-04 NOTE — Progress Notes (Signed)
Patient requesting medication for nausea. Zofran given as ordered as needed for nausea. Maridee Slape, Randall An  rN

## 2018-11-04 NOTE — Progress Notes (Signed)
ANTICOAGULATION CONSULT NOTE  Pharmacy Consult for Heparin Indication: Mechanical Valve  Patient Measurements: Height: 5\' 11"  (180.3 cm) Weight: 232 lb 5.8 oz (105.4 kg) IBW/kg (Calculated) : 75.3 Heparin Dosing Weight: 97.4kg  Vital Signs: Temp: 98.6 F (37 C) (03/06 1659) Temp Source: Oral (03/06 1659) BP: 126/74 (03/06 1357) Pulse Rate: 51 (03/06 1357)  Labs: Recent Labs    11/02/18 0430 11/02/18 1210 11/03/18 0311  11/04/18 0306 11/04/18 1130 11/04/18 1851  HGB 8.6*  --  9.1*  --  8.8*  --   --   HCT 26.1*  --  27.8*  --  26.7*  --   --   PLT 124*  --  142*  --  144*  --   --   LABPROT  --  14.5 16.4*  --  18.4*  --   --   INR  --  1.1 1.3*  --  1.6*  --   --   HEPARINUNFRC 0.41  --  0.71*   < > 0.23* <0.10* 0.17*   < > = values in this interval not displayed.     Medical History: Past Medical History:  Diagnosis Date  . Aortic stenosis    a. due to bicuspid AoV; 1983 S/p mechanical AVR (St. Jude) - chronic coumadin with INR run between 3.5-4.5;  b. 03/2012 Echo: EF 60%, nl wall motion, mod dil LA.  Marland Kitchen Ascending aortic aneurysm (HCC)   . Atrial tachycardia (HCC)    a. admitted 07/2012  . Coronary artery disease   . ED (erectile dysfunction)   . Gallstones   . H/O cardiac catheterization    a. 12/2007 Cath: nl cors.  . Headache(784.0)   . 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 Hypertension   . OSA (obstructive sleep apnea)    a. noncompliant with CPAP  . Paroxysmal atrial fibrillation (HCC)    s/p afib ablation x 2    Assessment: 58 year old male with hx of mech AVR+afib with heparin restarted s/p vascular surgery.   Heparin level this evening below goal.  Per RN, no further interruptions to IV infusion.  No overt bleeding or complications noted.  Goal of Therapy:  Heparin level 0.3-0.7 units/ml Monitor platelets by anticoagulation protocol: Yes   Plan:  Increase IV Hepairn to 1400 units/hr. Recheck level in 6 hrs. Monitor daily heparin  level, CBC, s/s of bleed  Jenetta Downer, [facility] Clinical Pharmacist Phone [phone]  11/04/2018 7:39 PM

## 2018-11-04 NOTE — Progress Notes (Signed)
ANTICOAGULATION CONSULT NOTE - Follow Up Consult  Pharmacy Consult for heparin Indication: AVR  Labs: Recent Labs    11/02/18 0430 11/02/18 1210 11/03/18 0311 11/03/18 1419 11/03/18 2039 11/04/18 0306  HGB 8.6*  --  9.1*  --   --  8.8*  HCT 26.1*  --  27.8*  --   --  26.7*  PLT 124*  --  142*  --   --  144*  LABPROT  --  14.5 16.4*  --   --  18.4*  INR  --  1.1 1.3*  --   --  1.6*  HEPARINUNFRC 0.41  --  0.71* 0.25* 0.38 0.23*    Assessment: 57yo male now subtherapeutic on heparin after one level at goal; no gtt issues or signs of bleeding per RN.  Goal of Therapy:  Heparin level 0.3-0.7 units/ml   Plan:  Will increase heparin gtt slightly to 1300 units/hr and check level in 6 hours.    Vernard Gambles, PharmD, BCPS  11/04/2018,4:26 AM

## 2018-11-04 NOTE — Progress Notes (Addendum)
11.00am . Unable to draw back on midline IV site. Heparin had been paused to draw lab, Lab made aware of need of Lab stick for heparin Level. And 11:43 heparin restarted. Verble Styron, [name]

## 2018-11-05 LAB — CBC
HCT: 26.1 % — ABNORMAL LOW (ref 39.0–52.0)
Hemoglobin: 8.7 g/dL — ABNORMAL LOW (ref 13.0–17.0)
MCH: 31 pg (ref 26.0–34.0)
MCHC: 33.3 g/dL (ref 30.0–36.0)
MCV: 92.9 fL (ref 80.0–100.0)
Platelets: 157 10*3/uL (ref 150–400)
RBC: 2.81 MIL/uL — ABNORMAL LOW (ref 4.22–5.81)
RDW: 12.5 % (ref 11.5–15.5)
WBC: 7 10*3/uL (ref 4.0–10.5)
nRBC: 0.3 % — ABNORMAL HIGH (ref 0.0–0.2)

## 2018-11-05 LAB — PROTIME-INR
INR: 1.6 — ABNORMAL HIGH (ref 0.8–1.2)
Prothrombin Time: 18.8 seconds — ABNORMAL HIGH (ref 11.4–15.2)

## 2018-11-05 LAB — HEPARIN LEVEL (UNFRACTIONATED)
Heparin Unfractionated: 0.23 IU/mL — ABNORMAL LOW (ref 0.30–0.70)
Heparin Unfractionated: 0.42 IU/mL (ref 0.30–0.70)
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
## 2018-11-05 NOTE — Progress Notes (Signed)
Patient and wife concerned that he will not be able to go home today. Patient wants INR checked again today to see if he can go home later. I spoke with unit pharmacist and she states increase would be negligible since he has not taken another coumadin dose. Pt resting with call bell within reach.  Will continue to monitor. Thomas Hoff, RN

## 2018-11-05 NOTE — Progress Notes (Signed)
ANTICOAGULATION CONSULT NOTE  Pharmacy Consult for Heparin Indication: Mechanical Valve  Patient Measurements: Height: 5\' 11"  (180.3 cm) Weight: 232 lb 5.8 oz (105.4 kg) IBW/kg (Calculated) : 75.3 Heparin Dosing Weight: 97.4kg  Vital Signs: Temp: 98.4 F (36.9 C) (03/06 2018) Temp Source: Oral (03/06 2018) BP: 127/81 (03/06 2018) Pulse Rate: 60 (03/06 2018)  Labs: Recent Labs    11/02/18 1210 11/03/18 0311  11/04/18 0306 11/04/18 1130 11/04/18 1851 11/05/18 0223  HGB  --  9.1*  --  8.8*  --   --  8.7*  HCT  --  27.8*  --  26.7*  --   --  26.1*  PLT  --  142*  --  144*  --   --  157  LABPROT 14.5 16.4*  --  18.4*  --   --   --   INR 1.1 1.3*  --  1.6*  --   --   --   HEPARINUNFRC  --  0.71*   < > 0.23* <0.10* 0.17* 0.23*   < > = values in this interval not displayed.   Assessment: 58 year old male with hx of mech AVR+afib with heparin restarted s/p vascular surgery.   Heparin level this evening below goal.  Per RN, no further interruptions to IV infusion.  No overt bleeding or complications noted.  Goal of Therapy:  Heparin level 0.3-0.7 units/ml Monitor platelets by anticoagulation protocol: Yes   Plan:  Increase IV Hepairn to 1500 units/hr. Recheck level in 6 hrs. Monitor daily heparin level, CBC, s/s of bleed  Thanks for allowing pharmacy to be a part of this patient's care.  Talbert Cage, PharmD Clinical Pharmacist   11/05/2018 3:03 AM

## 2018-11-05 NOTE — Progress Notes (Addendum)
  Progress Note    11/05/2018 9:21 AM 5 Days Post-Op  Subjective:  No complaints; wants to go home  Afebrile HR 40's-60's  110's-120's systolic 97% RA  Vitals:   11/04/18 2018 11/05/18 0529  BP: 127/81 112/74  Pulse: 60 (!) 59  Resp: 16 14  Temp: 98.4 F (36.9 C) 97.9 F (36.6 C)  SpO2: 98% 97%    Physical Exam: Cardiac:  regular Lungs:  Non labored Incisions:  Right arm incision clean and dry with some ecchymosis; left leg incisions are clean and dry. Extremities:  +palpable right radial pulse.    CBC    Component Value Date/Time   WBC 7.0 11/05/2018 0223   RBC 2.81 (L) 11/05/2018 0223   HGB 8.7 (L) 11/05/2018 0223   HGB 13.1 06/21/2017 0743   HCT 26.1 (L) 11/05/2018 0223   HCT 38.6 06/21/2017 0743   PLT 157 11/05/2018 0223   PLT 172 06/21/2017 0743   MCV 92.9 11/05/2018 0223   MCV 89 06/21/2017 0743   MCH 31.0 11/05/2018 0223   MCHC 33.3 11/05/2018 0223   RDW 12.5 11/05/2018 0223   RDW 13.0 06/21/2017 0743   LYMPHSABS 2.0 09/28/2018 1631   LYMPHSABS 1.8 06/21/2017 0743   MONOABS 0.5 09/28/2018 1631   EOSABS 0.2 09/28/2018 1631   EOSABS 0.1 06/21/2017 0743   BASOSABS 0.1 09/28/2018 1631   BASOSABS 0.0 06/21/2017 0743    BMET    Component Value Date/Time   NA 137 10/29/2018 0019   NA 142 06/21/2017 0743   K 4.1 10/29/2018 0019   CL 106 10/29/2018 0019   CO2 24 10/29/2018 0019   GLUCOSE 105 (H) 10/29/2018 0019   BUN 17 10/29/2018 0019   BUN 19 06/21/2017 0743   CREATININE 0.97 10/29/2018 0019   CALCIUM 8.7 (L) 10/29/2018 0019   GFRNONAA >60 10/29/2018 0019   GFRAA >60 10/29/2018 0019    INR    Component Value Date/Time   INR 1.6 (H) 11/05/2018 0223   INR 1.7 10/08/2010 0820     Intake/Output Summary (Last 24 hours) at 11/05/2018 0488 Last data filed at 11/05/2018 0200 Gross per 24 hour  Intake 270.78 ml  Output -  Net 270.78 ml     Assessment:  58 y.o. male is s/p:  R arm bypass with L GSV vein   5 Days Post-Op  Plan: -pt  doing well this morning with palpable right radial pulse.   -incisions are healing nicely.  -pt may shower this morning. -DVT prophylaxis:  Heparin to coumadin bridge for mechanical AVR.  May discharge home when INR is >2.0.  -mobilize   Doreatha Massed, [place] [facility] [phone] 11/05/2018 9:21 AM  I have seen and evaluated the patient. I agree with the PA note as documented above. INR 1.6 and needs >2 for discharge home given heart valves.  Bridge on heparin for now.  Will check INR again tomorrow.  Great right radial pulse after right arm bypass with GSV.    Cephus Shelling, MD [facility] [place] Office: [phone] Pager: [phone]

## 2018-11-05 NOTE — Progress Notes (Addendum)
ANTICOAGULATION CONSULT NOTE  Pharmacy Consult for Heparin Indication: Mechanical Valve  Patient Measurements: Height: 5\' 11"  (180.3 cm) Weight: 232 lb 5.8 oz (105.4 kg) IBW/kg (Calculated) : 75.3 Heparin Dosing Weight: 97.4kg  Vital Signs: Temp: 97.9 F (36.6 C) (03/07 0529) Temp Source: Oral (03/07 0529) BP: 112/74 (03/07 0529) Pulse Rate: 59 (03/07 0529)  Labs: Recent Labs    11/03/18 0311  11/04/18 0306  11/04/18 1851 11/05/18 0223 11/05/18 1132  HGB 9.1*  --  8.8*  --   --  8.7*  --   HCT 27.8*  --  26.7*  --   --  26.1*  --   PLT 142*  --  144*  --   --  157  --   LABPROT 16.4*  --  18.4*  --   --  18.8*  --   INR 1.3*  --  1.6*  --   --  1.6*  --   HEPARINUNFRC 0.71*   < > 0.23*   < > 0.17* 0.23* 0.42   < > = values in this interval not displayed.     Medical History: Past Medical History:  Diagnosis Date  . Aortic stenosis    a. due to bicuspid AoV; 1983 S/p mechanical AVR (St. Jude) - chronic coumadin with INR run between 3.5-4.5;  b. 03/2012 Echo: EF 60%, nl wall motion, mod dil LA.  Marland Kitchen Ascending aortic aneurysm (HCC)   . Atrial tachycardia (HCC)    a. admitted 07/2012  . Coronary artery disease   . ED (erectile dysfunction)   . Gallstones   . H/O cardiac catheterization    a. 12/2007 Cath: nl cors.  . Headache(784.0)   . 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 Hypertension   . OSA (obstructive sleep apnea)    a. noncompliant with CPAP  . Paroxysmal atrial fibrillation (HCC)    s/p afib ablation x 2    Assessment: 58 year old male with hx of mech AVR+afib with heparin restarted s/p vascular surgery.   Heparin level is therapeutic at 0.42, on 1500 units/hr. Hgb 8.7, plt 157. No s/sx of bleeding. No infusion issues. INR today remains at 1.6, receiving home regimen per MD. Will be due for 10 mg dose tonight.  Goal of Therapy:  Heparin level 0.3-0.7 units/ml Monitor platelets by anticoagulation protocol: Yes   Plan:  Continue IV Heparin  at 1500 units/hr Will receive 10 mg warfarin tonight per MD - could consider increased dose x1 to get INR moving into goal range Monitor daily heparin level, CBC, s/s of bleed  Sherron Monday, PharmD, BCCCP Clinical Pharmacist  Pager: [phone] Phone: [phone]  11/05/2018 12:35 PM

## 2018-11-06 LAB — PROTIME-INR
INR: 1.9 — ABNORMAL HIGH (ref 0.8–1.2)
Prothrombin Time: 21.4 seconds — ABNORMAL HIGH (ref 11.4–15.2)

## 2018-11-06 LAB — CBC
HCT: 28.3 % — ABNORMAL LOW (ref 39.0–52.0)
Hemoglobin: 9.2 g/dL — ABNORMAL LOW (ref 13.0–17.0)
MCH: 30.2 pg (ref 26.0–34.0)
MCHC: 32.5 g/dL (ref 30.0–36.0)
MCV: 92.8 fL (ref 80.0–100.0)
Platelets: 180 10*3/uL (ref 150–400)
RBC: 3.05 MIL/uL — ABNORMAL LOW (ref 4.22–5.81)
RDW: 12.7 % (ref 11.5–15.5)
WBC: 7.6 10*3/uL (ref 4.0–10.5)
nRBC: 0 % (ref 0.0–0.2)

## 2018-11-06 LAB — HEPARIN LEVEL (UNFRACTIONATED): Heparin Unfractionated: 0.63 [IU]/mL (ref 0.30–0.70)

## 2018-11-06 MED ORDER — WARFARIN SODIUM 5 MG PO TABS: 5.0000 mg | ORAL_TABLET | ORAL | Status: DC

## 2018-11-06 MED ORDER — ENOXAPARIN SODIUM 100 MG/ML ~~LOC~~ SOLN: 100.0000 mg | Freq: Two times a day (BID) | SUBCUTANEOUS | 0 refills | Status: DC

## 2018-11-06 MED ORDER — ENOXAPARIN SODIUM 100 MG/ML ~~LOC~~ SOLN
100.0000 mg | Freq: Two times a day (BID) | SUBCUTANEOUS | Status: DC
  Administered 2018-11-06: 100 mg via SUBCUTANEOUS
  Filled 2018-11-06: qty 1

## 2018-11-06 MED ORDER — HYDROCODONE-ACETAMINOPHEN 5-325 MG PO TABS: 1.0000 | ORAL_TABLET | Freq: Four times a day (QID) | ORAL | 0 refills | Status: DC | PRN

## 2018-11-06 NOTE — Progress Notes (Signed)
Patient ready for discharge. He is alert and oriented. He has all of his belongings with him. Midline has been removed catheter intact and no complications, pressure held, gauze dressing applied. PIV removed catheter intact. Patient has been removed from telemetry, CCMD notified. I have reviewed discharge instructions with patient and his wife, all questions regarding discharge have been answered. Patient will leave unit via wheelchair, he will meet wife at front entrance of the hospital and she will transport him home.

## 2018-11-06 NOTE — Progress Notes (Addendum)
  Progress Note    11/06/2018 7:37 AM 6 Days Post-Op  Subjective:  C/o soreness in his incisions in his leg.  Wants to go home.  Afebrile HR 50's-70's  100's-130's systolic 95% RA  Vitals:   11/05/18 1944 11/06/18 0524  BP: 135/76 106/68  Pulse: 65 (!) 105  Resp: 17 19  Temp: 98.4 F (36.9 C) 98.2 F (36.8 C)  SpO2: 100% 95%    Physical Exam: Cardiac:  regular Lungs:  Non labored Incisions:  Clean and dry; still with mild swelling and ecchymosis Extremities:  +palpable right radial pulse   CBC    Component Value Date/Time   WBC 7.6 11/06/2018 0510   RBC 3.05 (L) 11/06/2018 0510   HGB 9.2 (L) 11/06/2018 0510   HGB 13.1 06/21/2017 0743   HCT 28.3 (L) 11/06/2018 0510   HCT 38.6 06/21/2017 0743   PLT 180 11/06/2018 0510   PLT 172 06/21/2017 0743   MCV 92.8 11/06/2018 0510   MCV 89 06/21/2017 0743   MCH 30.2 11/06/2018 0510   MCHC 32.5 11/06/2018 0510   RDW 12.7 11/06/2018 0510   RDW 13.0 06/21/2017 0743   LYMPHSABS 2.0 09/28/2018 1631   LYMPHSABS 1.8 06/21/2017 0743   MONOABS 0.5 09/28/2018 1631   EOSABS 0.2 09/28/2018 1631   EOSABS 0.1 06/21/2017 0743   BASOSABS 0.1 09/28/2018 1631   BASOSABS 0.0 06/21/2017 0743    BMET    Component Value Date/Time   NA 137 10/29/2018 0019   NA 142 06/21/2017 0743   K 4.1 10/29/2018 0019   CL 106 10/29/2018 0019   CO2 24 10/29/2018 0019   GLUCOSE 105 (H) 10/29/2018 0019   BUN 17 10/29/2018 0019   BUN 19 06/21/2017 0743   CREATININE 0.97 10/29/2018 0019   CALCIUM 8.7 (L) 10/29/2018 0019   GFRNONAA >60 10/29/2018 0019   GFRAA >60 10/29/2018 0019    INR    Component Value Date/Time   INR 1.9 (H) 11/06/2018 0510   INR 1.7 10/08/2010 0820    No intake or output data in the 24 hours ending 11/06/18 0737   Assessment:  58 y.o. male is s/p:  R arm bypass with L GSV vein  6 Days Post-Op  Plan: -pt doing well this morning with palpable right radial pulse -incision looks good -DVT prophylaxis:  Heparin gtt  to coumadin.   INR today is 1.9.  May bridge with Lovenox so pt will discharge home today.  He will call coumadin clinic in am to go have his INR checked tomorrow.  -f/u with Dr. Darrick Penna in a couple of weeks.    Doreatha Massed, PA-C [facility] [phone] 11/06/2018 7:37 AM   I have seen and evaluated the patient. I agree with the PA note as documented above. S/P Right arm bypass Monday - great right radial pulse.  INR 1.9 today and wants to go home.  On coumadin for mechnical aortic valve.  Will bridge with therapeutic lovenox BID at  Home (states he is comfortable with this and has done it before).  Will go to coumadin clinic tomorrow for INR check.  When INR therapeutic discussed stopping lovenox.  Cephus Shelling, MD [facility] [place] Office: [phone] Pager: [phone]

## 2018-11-06 NOTE — Progress Notes (Signed)
ANTICOAGULATION CONSULT NOTE - Follow Up Consult  Pharmacy Consult for Lovenox Indication: hx mech AVR + Afib,  Allergies  Allergen Reactions  . Codeine Nausea And Vomiting  . Morphine And Related Nausea And Vomiting  . Percocet [Oxycodone-Acetaminophen] Nausea And Vomiting    Patient Measurements: Height: 5\' 11"  (180.3 cm) Weight: 232 lb 5.8 oz (105.4 kg) IBW/kg (Calculated) : 75.3  Vital Signs: Temp: 98.2 F (36.8 C) (03/08 0524) Temp Source: Oral (03/08 0524) BP: 106/68 (03/08 0524) Pulse Rate: 105 (03/08 0524)  Labs: Recent Labs    11/04/18 0306  11/05/18 0223 11/05/18 1132 11/06/18 0510  HGB 8.8*  --  8.7*  --  9.2*  HCT 26.7*  --  26.1*  --  28.3*  PLT 144*  --  157  --  180  LABPROT 18.4*  --  18.8*  --  21.4*  INR 1.6*  --  1.6*  --  1.9*  HEPARINUNFRC 0.23*   < > 0.23* 0.42 0.63   < > = values in this interval not displayed.    Estimated Creatinine Clearance: 106.3 mL/min (by C-G formula based on SCr of 0.97 mg/dL).   Assessment: Anticoag: warfarin PTA for hx mech AVR + Afib, LD 2/25.  Switch to Heparin for bypass graft to RUE. Hgb 9.2 and plts 180 improving. HL 0.63 in goal but change to LMWH for discharge. INR up to 1.9  *INR goal 3.5-4.5 per [facility] (patient and [facility] clinic do not know why INR goal is higher than usual since 2012) *home dose: 10mg  daily except 5mg  on Fri  Goal of Therapy:  Anti-Xa level 0.6-1 units/ml 4hrs after LMWH dose given  INR 3.5-4.5 for this patient Monitor platelets by anticoagulation protocol: Yes   Plan:   D/c IV heparin Start Lovenox 100mg  SQ BID for discharge. Warfarin per MD - 10 mg daily except 5 mg on Fri INR goal at home is 3.5-4.5 ?? AMM spoke with anticoag pharmacists in clinic > this was his goal prior to him going to [facility] > plan to continue goal 3.5-4.5  Anticoag: warfarin PTA for hx mech AVR + Afib, LD 2/25.  Switch to Heparin for bypass graft to RUE. Hgb 9.2 and plts 180 improving. HL 0.63 in goal but  change to LMWH for discharge. INR up to 1.9   Zakarie Sturdivant S. Merilynn Finland, PharmD, BCPS Clinical Staff Pharmacist [name] Stillinger 11/06/2018,9:29 AM

## 2018-11-07 ENCOUNTER — Telehealth: Payer: Self-pay | Admitting: Pharmacist

## 2018-11-07 NOTE — Telephone Encounter (Signed)
Pt discharged yesterday with Lovenox (only has 5 syringes and has used 2). Called today to make f/u appt. He has been taking home dose of warfarin since discharge yesterday. Advised that he take 15mg  today and scheduled f/u appt for 11/08/18.

## 2018-11-08 ENCOUNTER — Ambulatory Visit (INDEPENDENT_AMBULATORY_CARE_PROVIDER_SITE_OTHER): Payer: Managed Care, Other (non HMO) | Admitting: *Deleted

## 2018-11-08 DIAGNOSIS — Z7901 Long term (current) use of anticoagulants: Secondary | ICD-10-CM

## 2018-11-08 DIAGNOSIS — Z952 Presence of prosthetic heart valve: Secondary | ICD-10-CM

## 2018-11-08 DIAGNOSIS — I4891 Unspecified atrial fibrillation: Secondary | ICD-10-CM

## 2018-11-08 LAB — POCT INR: INR: 4.4 — AB (ref 2.0–3.0)

## 2018-11-08 NOTE — Patient Instructions (Signed)
Description   Today take 5mg  take 10mg  tomorrow and 5mg  on Thursday then Recheck INR on Friday.  Normal dose: 10 mg daily except 5mg  on Fridays. Coumadin Clinic [phone]

## 2018-11-11 ENCOUNTER — Ambulatory Visit (INDEPENDENT_AMBULATORY_CARE_PROVIDER_SITE_OTHER): Payer: Managed Care, Other (non HMO) | Admitting: Pharmacist

## 2018-11-11 ENCOUNTER — Other Ambulatory Visit: Payer: Self-pay

## 2018-11-11 DIAGNOSIS — I4891 Unspecified atrial fibrillation: Secondary | ICD-10-CM

## 2018-11-11 DIAGNOSIS — Z7901 Long term (current) use of anticoagulants: Secondary | ICD-10-CM | POA: Diagnosis not present

## 2018-11-11 DIAGNOSIS — Z952 Presence of prosthetic heart valve: Secondary | ICD-10-CM | POA: Diagnosis not present

## 2018-11-11 LAB — POCT INR: INR: 2.3 (ref 2.0–3.0)

## 2018-11-11 NOTE — Progress Notes (Signed)
Carelink Summary Report / Loop Recorder 

## 2018-11-17 ENCOUNTER — Telehealth: Payer: Self-pay

## 2018-11-17 NOTE — Telephone Encounter (Signed)

## 2018-11-18 ENCOUNTER — Ambulatory Visit (INDEPENDENT_AMBULATORY_CARE_PROVIDER_SITE_OTHER): Payer: Managed Care, Other (non HMO) | Admitting: Pharmacist

## 2018-11-18 ENCOUNTER — Telehealth: Payer: Self-pay

## 2018-11-18 ENCOUNTER — Other Ambulatory Visit: Payer: Self-pay

## 2018-11-18 DIAGNOSIS — Z7901 Long term (current) use of anticoagulants: Secondary | ICD-10-CM

## 2018-11-18 DIAGNOSIS — I4891 Unspecified atrial fibrillation: Secondary | ICD-10-CM

## 2018-11-18 DIAGNOSIS — Z952 Presence of prosthetic heart valve: Secondary | ICD-10-CM | POA: Diagnosis not present

## 2018-11-18 LAB — POCT INR: INR: 2.2 (ref 2.0–3.0)

## 2018-11-18 NOTE — Telephone Encounter (Signed)
Spoke to pt to let him know he is scheduled for his post-op appt on Thursday 4/2. Pt verbalized understanding. No further questions at this time.

## 2018-11-18 NOTE — Patient Instructions (Signed)
Today take 10mg  today and 15mg  tomorrow then continue 10 mg daily except 5mg  on Fridays. Recheck in 1 week Coumadin Clinic [phone]. Go back to your previous greens intake.

## 2018-11-23 ENCOUNTER — Telehealth: Payer: Self-pay

## 2018-11-23 NOTE — Telephone Encounter (Signed)

## 2018-11-25 ENCOUNTER — Other Ambulatory Visit: Payer: Self-pay

## 2018-11-25 ENCOUNTER — Ambulatory Visit (INDEPENDENT_AMBULATORY_CARE_PROVIDER_SITE_OTHER): Payer: Managed Care, Other (non HMO) | Admitting: Pharmacist

## 2018-11-25 DIAGNOSIS — Z952 Presence of prosthetic heart valve: Secondary | ICD-10-CM | POA: Diagnosis not present

## 2018-11-25 DIAGNOSIS — I4891 Unspecified atrial fibrillation: Secondary | ICD-10-CM | POA: Diagnosis not present

## 2018-11-25 DIAGNOSIS — Z7901 Long term (current) use of anticoagulants: Secondary | ICD-10-CM

## 2018-11-25 LAB — POCT INR: INR: 3.9 — AB (ref 2.0–3.0)

## 2018-11-29 ENCOUNTER — Other Ambulatory Visit: Payer: Self-pay | Admitting: Internal Medicine

## 2018-11-30 ENCOUNTER — Other Ambulatory Visit: Payer: Self-pay

## 2018-11-30 ENCOUNTER — Telehealth (HOSPITAL_COMMUNITY): Payer: Self-pay | Admitting: Rehabilitation

## 2018-11-30 DIAGNOSIS — I721 Aneurysm of artery of upper extremity: Secondary | ICD-10-CM

## 2018-11-30 NOTE — Telephone Encounter (Signed)
The above patient or their representative was contacted and gave the following answers to these questions:         Do you have any of the following symptoms? No  Fever                    Cough                   Shortness of breath  Do  you have any of the following other symptoms? No   muscle pain         vomiting,        diarrhea        rash         weakness        red eye        abdominal pain         bruising          bruising or bleeding              joint pain           severe headache    Have you been in contact with someone who was or has been sick in the past 2 weeks? No  Yes                 Unsure                         Unable to assess   Does the person that you were in contact with have any of the following symptoms? No  Cough         shortness of breath           muscle pain         vomiting,            diarrhea            rash            weakness           fever            red eye           abdominal pain           bruising  or  bleeding                joint pain                severe headache               Have you  or someone you have been in contact with traveled internationally in th last month?  No       If yes, which countries?   Have you  or someone you have been in contact with traveled outside [place] in th last month?  No       If yes, which state and city?   COMMENTS OR ACTION PLAN FOR THIS PATIENT:          

## 2018-12-01 ENCOUNTER — Ambulatory Visit (INDEPENDENT_AMBULATORY_CARE_PROVIDER_SITE_OTHER): Payer: Managed Care, Other (non HMO) | Admitting: Vascular Surgery

## 2018-12-01 ENCOUNTER — Ambulatory Visit (HOSPITAL_COMMUNITY)
Admission: RE | Admit: 2018-12-01 | Discharge: 2018-12-01 | Disposition: A | Discharge status: Home / Self Care | Payer: Managed Care, Other (non HMO) | Source: Ambulatory Visit | Attending: Family | Admitting: Family

## 2018-12-01 ENCOUNTER — Encounter: Payer: Self-pay | Admitting: Vascular Surgery

## 2018-12-01 ENCOUNTER — Other Ambulatory Visit: Payer: Self-pay

## 2018-12-01 ENCOUNTER — Other Ambulatory Visit (HOSPITAL_COMMUNITY): Payer: Managed Care, Other (non HMO)

## 2018-12-01 ENCOUNTER — Encounter: Payer: Managed Care, Other (non HMO) | Admitting: Vascular Surgery

## 2018-12-01 VITALS — BP 104/71 | HR 55 | Temp 97.3°F | Resp 20 | Ht 71.0 in | Wt 227.0 lb

## 2018-12-01 DIAGNOSIS — I721 Aneurysm of artery of upper extremity: Secondary | ICD-10-CM

## 2018-12-01 NOTE — Progress Notes (Signed)
Patient is a 58 year old male who returns for postoperative follow-up today.  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  We used left greater saphenous vein to reconstruct him.  He reports that he still has numbness and tingling along the ulnar aspect of his right arm.  Some of this was present preoperatively.  The aneurysm was densely adherent to multiple nerves in his right arm.  He reports no incisional drainage from his right arm or his left leg.  He has no aching in his right hand.  Physical exam:  Vitals:   12/01/18 0918  BP: 104/71  Pulse: (!) 55  Resp: 20  Temp: (!) 97.3 F (36.3 C)  SpO2: 98%  Weight: 227 lb (103 kg)  Height: 5\' 11"  (1.803 m)    Right upper extremity: 2+ right radial brachial pulse well-healed right arm incision  Left lower extremity: Left greater saphenous incision well-healed  Data: Duplex ultrasound of the patient's bypass graft today was reviewed and interpreted by me.  This is widely patent with no evidence of narrowing.  Assessment: Successful reconstruction of right arm bypass with left greater saphenous vein.  Still some persistent neuropathic symptoms which hopefully will resolve over time.  Plan: The patient will follow-up with [place] on an as-needed basis.  Fabienne Bruns, MD [facility] Office: [phone] Pager: [phone]

## 2018-12-06 ENCOUNTER — Other Ambulatory Visit: Payer: Self-pay

## 2018-12-06 ENCOUNTER — Ambulatory Visit (INDEPENDENT_AMBULATORY_CARE_PROVIDER_SITE_OTHER): Payer: Managed Care, Other (non HMO) | Admitting: *Deleted

## 2018-12-06 DIAGNOSIS — I4891 Unspecified atrial fibrillation: Secondary | ICD-10-CM

## 2018-12-07 LAB — CUP PACEART REMOTE DEVICE CHECK
Date Time Interrogation Session: 20200407213912
Implantable Pulse Generator Implant Date: 20180807

## 2018-12-14 NOTE — Progress Notes (Signed)
Carelink Summary Report / Loop Recorder 

## 2018-12-15 ENCOUNTER — Telehealth: Payer: Self-pay

## 2018-12-15 NOTE — Telephone Encounter (Signed)
lmom for prescreen  

## 2018-12-31 ENCOUNTER — Other Ambulatory Visit: Payer: Self-pay | Admitting: Family Medicine

## 2019-01-05 ENCOUNTER — Other Ambulatory Visit (HOSPITAL_COMMUNITY): Payer: Self-pay | Admitting: Internal Medicine

## 2019-01-08 ENCOUNTER — Other Ambulatory Visit (HOSPITAL_COMMUNITY): Payer: Self-pay | Admitting: Internal Medicine

## 2019-01-09 ENCOUNTER — Ambulatory Visit (INDEPENDENT_AMBULATORY_CARE_PROVIDER_SITE_OTHER): Payer: Managed Care, Other (non HMO) | Admitting: *Deleted

## 2019-01-09 ENCOUNTER — Other Ambulatory Visit: Payer: Self-pay

## 2019-01-09 DIAGNOSIS — I4891 Unspecified atrial fibrillation: Secondary | ICD-10-CM

## 2019-01-09 LAB — CUP PACEART REMOTE DEVICE CHECK
Date Time Interrogation Session: 20200510213953
Implantable Pulse Generator Implant Date: 20180807

## 2019-01-15 ENCOUNTER — Other Ambulatory Visit: Payer: Self-pay | Admitting: Internal Medicine

## 2019-01-16 ENCOUNTER — Telehealth: Payer: Self-pay

## 2019-01-16 NOTE — Progress Notes (Signed)
Carelink Summary Report / Loop Recorder 

## 2019-01-16 NOTE — Telephone Encounter (Signed)

## 2019-01-19 ENCOUNTER — Telehealth (HOSPITAL_COMMUNITY): Payer: Self-pay | Admitting: Cardiology

## 2019-01-19 NOTE — Telephone Encounter (Signed)
Patient called to report L sided sharp CP. Reports this has been going on for some months now however recently (past two weeks) increase in intensity.   Denies-SOB, arm weakness, dizziness,visual changes Cath over 5 years ago Reports b/p readings have been stable No nitroglycerin at home  Advised if CP returns and does not go away he should report to the ER for further evaluation. Also if CP returns with additional symptoms (HA,dizziness, arm/jaw pain, visual changes) he should also report to ER  In the meantime will forward to provider for further advise

## 2019-01-20 NOTE — Telephone Encounter (Signed)
This has been a chronic problem for him and likely musculoskeletal. If it gets worse or changes he should let us know though.

## 2019-01-24 NOTE — Telephone Encounter (Signed)
Attempt to return call No answer LMOM

## 2019-01-25 ENCOUNTER — Other Ambulatory Visit: Payer: Self-pay

## 2019-01-25 ENCOUNTER — Ambulatory Visit: Payer: Self-pay | Admitting: *Deleted

## 2019-01-25 ENCOUNTER — Encounter: Payer: Self-pay | Admitting: Family Medicine

## 2019-01-25 ENCOUNTER — Ambulatory Visit (INDEPENDENT_AMBULATORY_CARE_PROVIDER_SITE_OTHER): Payer: Managed Care, Other (non HMO) | Admitting: Family Medicine

## 2019-01-25 ENCOUNTER — Encounter (HOSPITAL_COMMUNITY): Payer: Self-pay | Admitting: *Deleted

## 2019-01-25 VITALS — BP 165/78 | HR 55 | Temp 98.9°F | Ht 72.0 in | Wt 225.0 lb

## 2019-01-25 DIAGNOSIS — W57XXXA Bitten or stung by nonvenomous insect and other nonvenomous arthropods, initial encounter: Secondary | ICD-10-CM | POA: Diagnosis not present

## 2019-01-25 DIAGNOSIS — S20362A Insect bite (nonvenomous) of left front wall of thorax, initial encounter: Secondary | ICD-10-CM

## 2019-01-25 MED ORDER — DOXYCYCLINE HYCLATE 100 MG PO TABS: 100.0000 mg | ORAL_TABLET | Freq: Two times a day (BID) | ORAL | 0 refills | Status: DC

## 2019-01-25 NOTE — Telephone Encounter (Signed)
  Reason for Disposition . Red ring or bull's-eye rash occurs at tick bite  Answer Assessment - Initial Assessment Questions 1. TYPE of TICK: "Is it a wood tick or a deer tick?" If unsure, ask: "What size was the tick?" "Did it look more like a watermelon seed or a poppy seed?"      Pencil lead 2. LOCATION: "Where is the tick bite located?"      Left shoulder 3. ONSET: "How long do you think the tick was attached before you removed it?" (Hours or days)      24 hours 4. TETANUS: "When was the last tetanus booster?"   about 5 years ago 5. PREGNANCY: "Is there any chance you are pregnant?" "When was your last menstrual period?"     n/a  Protocols used: TICK BITE-A-AH

## 2019-01-25 NOTE — Telephone Encounter (Signed)
FYI

## 2019-01-25 NOTE — Progress Notes (Signed)
Virtual Visit via Video   I connected with patient on 01/25/19 at  3:20 PM EDT by a video enabled telemedicine application and verified that I am speaking with the correct person using two identifiers.  Location patient: Home Location provider: [facility], Office Persons participating in the virtual visit: Patient, Provider, CMA (Jess B)  I discussed the limitations of evaluation and management by telemedicine and the availability of in person appointments. The patient expressed understanding and agreed to proceed.  Subjective:   HPI:   Tick bite- L chest, just anterior to axilla.  Found on Saturday.  Tick was engorged.  Was able to remove completely.  Surrounding redness present at time of discovery.  No fevers.  + drainage- clear.    ROS:   See pertinent positives and negatives per HPI.  Patient Active Problem List   Diagnosis Date Noted  . PAD (peripheral artery disease) (HCC) 10/27/2018  . Thoracic aortic aneurysm without rupture (HCC) 10/03/2018  . 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 Obesity (BMI 30-39.9) 09/28/2018  . Physical exam 02/01/2017  . PVC's (premature ventricular contractions) 02/21/2014  . Essential hypertension, benign 11/15/2013  . Closed L1 vertebral fracture (HCC) 05/29/2013  . Abnormal surgical wound 02/07/2013  . Chronic cholecystitis with calculus 01/03/2013  . Labyrinthitis 09/13/2012  . Sleep apnea, obstructive 03/16/2012  . Seasonal allergic rhinitis 12/25/2011  . Aneurysm of ascending aorta (HCC) 09/28/2011  . Palpitations 09/18/2011  . Long term (current) use of anticoagulants 12/11/2010  . Hyperlipidemia 12/11/2010  . Aortic stenosis   . 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 Atrial fibrillation (HCC) 05/21/2010  . ANKLE SPRAIN, LEFT 04/28/2010  . HEADACHE 04/18/2010  . 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 HEMOPTYSIS 11/21/2008    Social History   Tobacco Use  . Smoking status: Never Smoker  .  Smokeless tobacco: Never Used  Substance Use Topics  . Alcohol use: No    Current Outpatient Medications:  .  aspirin EC 81 MG tablet, Take 81 mg by mouth at bedtime. , Disp: , Rfl:  .  carvedilol (COREG) 6.25 MG tablet, TAKE 1 TABLET BY MOUTH TWICE A DAY, Disp: 180 tablet, Rfl: 1 .  diltiazem (CARDIZEM) 60 MG tablet, Take one tablet by mouth every 6 hours as needed for afib (Patient taking differently: Take 60 mg by mouth every 6 (six) hours as needed (afib). ), Disp: 30 tablet, Rfl: 1 .  esomeprazole (NEXIUM) 20 MG capsule, Take 20 mg by mouth at bedtime., Disp: , Rfl:  .  ibuprofen (ADVIL,MOTRIN) 200 MG tablet, Take 400-600 mg by mouth every 8 (eight) hours as needed (pain.)., Disp: , Rfl:  .  lisinopril (PRINIVIL,ZESTRIL) 40 MG tablet, TAKE 1 TABLET BY MOUTH EVERY DAY (Patient taking differently: Take 40 mg by mouth at bedtime. ), Disp: 90 tablet, Rfl: 1 .  meclizine (ANTIVERT) 25 MG tablet, Take 1 tablet (25 mg total) by mouth 3 (three) times daily as needed for dizziness., Disp: 30 tablet, Rfl: 0 .  simvastatin (ZOCOR) 20 MG tablet, TAKE 1 TABLET BY MOUTH EVERYDAY AT BEDTIME, Disp: 90 tablet, Rfl: 0 .  verapamil (VERELAN PM) 120 MG 24 hr capsule, Take 1 capsule (120 mg total) by mouth at bedtime., Disp: 90 capsule, Rfl: 1 .  warfarin (COUMADIN) 10 MG tablet, TAKE AS DIRECTED BY COUMADIN CLINIC, Disp: 40 tablet, Rfl: 1 .  sildenafil (VIAGRA) 50 MG tablet, TAKE 1 TABLET BY MOUTH DAILY AS NEEDED FOR ERECTILE DYSFUNCTION. (Patient not taking:  Reported on 01/25/2019), Disp: 10 tablet, Rfl: 0 .  spironolactone (ALDACTONE) 25 MG tablet, Take 1 tablet (25 mg total) by mouth daily. NEED FOLLOW UP APPT FOR FUTURE REFILLS  [id number] (Patient not taking: Reported on 01/25/2019), Disp: 90 tablet, Rfl: 0  Allergies  Allergen Reactions  . Codeine Nausea And Vomiting  . Morphine And Related Nausea And Vomiting  . Percocet [Oxycodone-Acetaminophen] Nausea And Vomiting    Objective:   BP (!) 165/78    Pulse (!) 55   Temp 98.9 F (37.2 C) (Temporal)   Ht 6' (1.829 m)   Wt 225 lb (102.1 kg)   BMI 30.52 kg/m   AAOx3, NAD NCAT, EOMI No obvious CN deficits Coloring WNL Pt is able to speak clearly, coherently without shortness of breath or increased work of breathing.  Thought process is linear.  Mood is appropriate. Area of erythema just anterior to L axilla  Assessment and Plan:   Tick bite- new.  Area w/ erythema and drainage.  Pt denies fever, chills, spreading rash.  Pt is on Coumadin and we discussed need for PT check due to abx use.  Will start Doxy to cover tick borne illnesses and cellulitis.  Reviewed supportive care and red flags that should prompt return.  Pt expressed understanding and is in agreement w/ plan.    Neena Rhymes, MD 01/25/2019

## 2019-01-25 NOTE — Telephone Encounter (Signed)
Pt left VM returning call I called patient back he did not answer. Detailed mychart message sent to patient with Dr.Bensimhons advice.

## 2019-01-25 NOTE — Telephone Encounter (Addendum)
Pt called with complaints of a tick bite on 01/21/2019; removed the entire tick, and states that it had been in place for 24 hours; the affected area is on his left shoulder, and is redden with a bullseye, and itchy; he states that he does not have a fever, body aches, or chills; he also says that the tick was black and did not have any markings; recommendations made per nurse triage protocol; he verbalized understanding; pt transferred to [facility], for scheduling.

## 2019-01-25 NOTE — Progress Notes (Signed)
I have discussed the procedure for the virtual visit with the patient who has given consent to proceed with assessment and treatment.   Rahmir Beever L Demarea Lorey, CMA     

## 2019-01-26 ENCOUNTER — Telehealth: Payer: Self-pay | Admitting: Internal Medicine

## 2019-01-26 NOTE — Telephone Encounter (Signed)
Pt called to set up an appt with the [facility]. He missed his last two appts.

## 2019-01-26 NOTE — Telephone Encounter (Signed)
Pt called back into clinic, he was started on Levaquin 750mg  QD x 10 days on 01/24/18.  Made appt to check INR on Monday 01/30/19 at 3pm.

## 2019-01-26 NOTE — Telephone Encounter (Signed)
Called patient and left voicemail to return call. Left coumadin clinic direct line

## 2019-01-30 ENCOUNTER — Other Ambulatory Visit: Payer: Self-pay

## 2019-02-01 ENCOUNTER — Other Ambulatory Visit: Payer: Self-pay | Admitting: Internal Medicine

## 2019-02-01 NOTE — Telephone Encounter (Signed)
Pt is overdue for INR check and missed appt last week. LMOM-need to reschedule especially since pt started Levaquin on 5/27.

## 2019-02-06 ENCOUNTER — Encounter: Payer: Self-pay | Admitting: Family Medicine

## 2019-02-06 ENCOUNTER — Ambulatory Visit (INDEPENDENT_AMBULATORY_CARE_PROVIDER_SITE_OTHER): Payer: Managed Care, Other (non HMO) | Admitting: Family Medicine

## 2019-02-06 ENCOUNTER — Telehealth: Payer: Self-pay | Admitting: *Deleted

## 2019-02-06 ENCOUNTER — Other Ambulatory Visit: Payer: Self-pay

## 2019-02-06 VITALS — BP 138/83 | HR 72 | Ht 71.0 in | Wt 225.0 lb

## 2019-02-06 DIAGNOSIS — I10 Essential (primary) hypertension: Secondary | ICD-10-CM | POA: Diagnosis not present

## 2019-02-06 NOTE — Telephone Encounter (Signed)
Pt called and was wanting to set up an appointment to get his INR checked. Attempted to get pt scheduled for Wednesday, however, pt said he would be in [place]. Appointment scheduled for 02/20/2019. Offered for pt to get INR drawn at a lab in [place]. Pt stated he would call tomorrow to see if he would be able to go to a lab.

## 2019-02-06 NOTE — Progress Notes (Signed)
Virtual Visit via Video   I connected with patient on 02/06/19 at  2:00 PM EDT by a video enabled telemedicine application and verified that I am speaking with the correct person using two identifiers.  Location patient: Home Location provider: [facility], Office Persons participating in the virtual visit: Patient, Provider, [place] (Jess B)  I discussed the limitations of evaluation and management by telemedicine and the availability of in person appointments. The patient expressed understanding and agreed to proceed.  Subjective:   HPI:   HTN- pt's BP was elevated at last visit.  Today is WNL.  On Coreg 6.25mg  BID, Dilt 60mg  Q6 prn, Lisinopril 40mg  daily, Spironolactone 25mg  daily, Verapamil 120mg  daily.  No CP, SOB, HAs, visual changes, edema.  ROS:   See pertinent positives and negatives per HPI.  Patient Active Problem List   Diagnosis Date Noted  . PAD (peripheral artery disease) ([place]) 10/27/2018  . Thoracic aortic aneurysm without rupture ([place]) 10/03/2018  . Aneurysm of artery of upper extremity ([place]) 09/28/2018  . Obesity (BMI 30-39.9) 09/28/2018  . Physical exam 02/01/2017  . PVC's (premature ventricular contractions) 02/21/2014  . Essential hypertension, benign 11/15/2013  . Closed L1 vertebral fracture ([place]) 05/29/2013  . Abnormal surgical wound 02/07/2013  . Chronic cholecystitis with calculus 01/03/2013  . Labyrinthitis 09/13/2012  . Sleep apnea, obstructive 03/16/2012  . Seasonal allergic rhinitis 12/25/2011  . Aneurysm of ascending aorta (HCC) 09/28/2011  . Palpitations 09/18/2011  . Long term (current) use of anticoagulants 12/11/2010  . Hyperlipidemia 12/11/2010  . Aortic stenosis   . 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 Atrial fibrillation ([place]) 05/21/2010  . ANKLE SPRAIN, LEFT 04/28/2010  . HEADACHE 04/18/2010  . 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 HEMOPTYSIS 11/21/2008    Social History   Tobacco Use  . Smoking status: Never  Smoker  . Smokeless tobacco: Never Used  Substance Use Topics  . Alcohol use: No    Current Outpatient Medications:  .  aspirin EC 81 MG tablet, Take 81 mg by mouth at bedtime. , Disp: , Rfl:  .  carvedilol (COREG) 6.25 MG tablet, TAKE 1 TABLET BY MOUTH TWICE A DAY, Disp: 180 tablet, Rfl: 1 .  diltiazem (CARDIZEM) 60 MG tablet, Take one tablet by mouth every 6 hours as needed for afib (Patient taking differently: Take 60 mg by mouth every 6 (six) hours as needed (afib). ), Disp: 30 tablet, Rfl: 1 .  esomeprazole (NEXIUM) 20 MG capsule, Take 20 mg by mouth at bedtime., Disp: , Rfl:  .  ibuprofen (ADVIL,MOTRIN) 200 MG tablet, Take 400-600 mg by mouth every 8 (eight) hours as needed (pain.)., Disp: , Rfl:  .  lisinopril (PRINIVIL,ZESTRIL) 40 MG tablet, TAKE 1 TABLET BY MOUTH EVERY DAY (Patient taking differently: Take 40 mg by mouth at bedtime. ), Disp: 90 tablet, Rfl: 1 .  meclizine (ANTIVERT) 25 MG tablet, Take 1 tablet (25 mg total) by mouth 3 (three) times daily as needed for dizziness., Disp: 30 tablet, Rfl: 0 .  sildenafil (VIAGRA) 50 MG tablet, TAKE 1 TABLET BY MOUTH DAILY AS NEEDED FOR ERECTILE DYSFUNCTION., Disp: 10 tablet, Rfl: 0 .  simvastatin (ZOCOR) 20 MG tablet, TAKE 1 TABLET BY MOUTH EVERYDAY AT BEDTIME, Disp: 90 tablet, Rfl: 0 .  spironolactone (ALDACTONE) 25 MG tablet, Take 1 tablet (25 mg total) by mouth daily. NEED FOLLOW UP APPT FOR FUTURE REFILLS  [phone], Disp: 90 tablet, Rfl: 0 .  verapamil (VERELAN PM) 120 MG 24 hr capsule, Take 1  capsule (120 mg total) by mouth at bedtime., Disp: 90 capsule, Rfl: 1 .  warfarin (COUMADIN) 10 MG tablet, TAKE AS DIRECTED BY COUMADIN CLINIC, Disp: 40 tablet, Rfl: 1  Allergies  Allergen Reactions  . Codeine Nausea And Vomiting  . Morphine And Related Nausea And Vomiting  . Percocet [Oxycodone-Acetaminophen] Nausea And Vomiting    Objective:   BP 138/83   Pulse 72   Ht 5\' 11"  (1.803 m)   Wt 225 lb (102.1 kg)   BMI 31.38 kg/m    AAOx3, NAD NCAT, EOMI No obvious CN deficits Coloring WNL Pt is able to speak clearly, coherently without shortness of breath or increased work of breathing.  Thought process is linear.  Mood is appropriate.   Assessment and Plan:   HTN- much better control today.  Asymptomatic.  No explanation for elevated pressure at last visit as pt reports he is compliant w/ meds.  No changes at this time.  Will follow.   Neena RhymesKatherine Nandita Mathenia, MD 02/06/2019

## 2019-02-06 NOTE — Telephone Encounter (Signed)
LMOM again. 

## 2019-02-06 NOTE — Progress Notes (Signed)
I have discussed the procedure for the virtual visit with the patient who has given consent to proceed with assessment and treatment.   Ecko Beasley L Abbagayle Zaragoza, CMA     

## 2019-02-07 NOTE — Telephone Encounter (Signed)
Talked with pt to attempt to get him here at this clinic today but he stays he is already on the road to to [place]. Thus, he will keep his appt for next week, he states he doesn't think he will have enough med until that appt thus Rx sent for #15 tablets.

## 2019-02-10 ENCOUNTER — Ambulatory Visit (INDEPENDENT_AMBULATORY_CARE_PROVIDER_SITE_OTHER): Payer: Managed Care, Other (non HMO) | Admitting: *Deleted

## 2019-02-10 DIAGNOSIS — R001 Bradycardia, unspecified: Secondary | ICD-10-CM

## 2019-02-13 ENCOUNTER — Telehealth: Payer: Self-pay

## 2019-02-13 LAB — CUP PACEART REMOTE DEVICE CHECK
Date Time Interrogation Session: 20200612221113
Implantable Pulse Generator Implant Date: 20180807

## 2019-02-13 NOTE — Progress Notes (Signed)
Carelink Summary Report / Loop Recorder 

## 2019-02-13 NOTE — Telephone Encounter (Signed)

## 2019-02-21 ENCOUNTER — Other Ambulatory Visit: Payer: Self-pay | Admitting: Internal Medicine

## 2019-02-21 NOTE — Telephone Encounter (Signed)
Called the pt in regards to a past due coumadin appt because we cannot refill until seen lov (11/25/18)... awaiting a callback

## 2019-02-21 NOTE — Telephone Encounter (Signed)
Pt has missed last 4 INR checks in 2 months - there is 1 opening in the next 2 weeks which pt elected to take for tomorrow 6/24 in the afternoon. Will refill Coumadin then.

## 2019-02-22 ENCOUNTER — Other Ambulatory Visit: Payer: Self-pay

## 2019-02-22 ENCOUNTER — Ambulatory Visit (INDEPENDENT_AMBULATORY_CARE_PROVIDER_SITE_OTHER): Payer: Managed Care, Other (non HMO) | Admitting: *Deleted

## 2019-02-22 DIAGNOSIS — Z952 Presence of prosthetic heart valve: Secondary | ICD-10-CM | POA: Diagnosis not present

## 2019-02-22 DIAGNOSIS — I4891 Unspecified atrial fibrillation: Secondary | ICD-10-CM

## 2019-02-22 LAB — POCT INR: INR: 4.6 — AB (ref 2.0–3.0)

## 2019-02-22 NOTE — Patient Instructions (Signed)
Description   Take 5 mg today, then continue 10 mg daily except 5mg  on Fridays. Recheck in 3 weeks [facility]in Clinic [phone]. Stay consistent on green vegetable intake.

## 2019-02-23 ENCOUNTER — Telehealth: Payer: Self-pay

## 2019-02-23 NOTE — Telephone Encounter (Signed)
Patient called and said that the area that he had an aneurysm at before is now giving him a lot of trouble. He said that his arm is tingling with little exertion and falling asleep. He said that his job really does not allow for him to have issues like this and he would like to have it looked at to see if something else needs to be done.   Appt made for pt to be seen.   York Cerise, CMA

## 2019-02-24 ENCOUNTER — Other Ambulatory Visit: Payer: Self-pay

## 2019-02-24 ENCOUNTER — Ambulatory Visit (INDEPENDENT_AMBULATORY_CARE_PROVIDER_SITE_OTHER): Payer: Self-pay | Admitting: Physician Assistant

## 2019-02-24 ENCOUNTER — Encounter: Payer: Self-pay | Admitting: Physician Assistant

## 2019-02-24 VITALS — BP 128/80 | HR 51 | Temp 97.1°F | Resp 18 | Ht 72.0 in | Wt 231.0 lb

## 2019-02-24 DIAGNOSIS — I721 Aneurysm of artery of upper extremity: Secondary | ICD-10-CM

## 2019-02-24 NOTE — Progress Notes (Signed)
Established Previous Bypass   History of Present Illness   Mark [name] is a 58 y.o. ([birth year]) male who returns to clinic due to numbness and tingling ongoing in right arm and hand.  Many years ago he sustained a trauma to his right arm that required brachial artery to brachial artery bypass with vein.  Over time this had aneurysmal degeneration and had to undergo re-do brachial to brachial bypass with left greater saphenous vein by Dr. Oneida Alar at the end of February of this year.  Patient states that the numbness and tingling in his right hand and forearm and the ulnar nerve distribution makes him much more difficult to perform his job.  He is taking an aspirin daily.  He is taking Coumadin for mechanical heart valve.  He is wondering if there is anything that can be done to help this problem.  Current Outpatient Medications  Medication Sig Dispense Refill  . aspirin EC 81 MG tablet Take 81 mg by mouth at bedtime.     . carvedilol (COREG) 6.25 MG tablet TAKE 1 TABLET BY MOUTH TWICE A DAY 180 tablet 1  . diltiazem (CARDIZEM) 60 MG tablet Take one tablet by mouth every 6 hours as needed for afib (Patient taking differently: Take 60 mg by mouth every 6 (six) hours as needed (afib). ) 30 tablet 1  . esomeprazole (NEXIUM) 20 MG capsule Take 20 mg by mouth at bedtime.    Marland Kitchen ibuprofen (ADVIL,MOTRIN) 200 MG tablet Take 400-600 mg by mouth every 8 (eight) hours as needed (pain.).    Marland Kitchen lisinopril (PRINIVIL,ZESTRIL) 40 MG tablet TAKE 1 TABLET BY MOUTH EVERY DAY (Patient taking differently: Take 40 mg by mouth at bedtime. ) 90 tablet 1  . meclizine (ANTIVERT) 25 MG tablet Take 1 tablet (25 mg total) by mouth 3 (three) times daily as needed for dizziness. 30 tablet 0  . sildenafil (VIAGRA) 50 MG tablet TAKE 1 TABLET BY MOUTH DAILY AS NEEDED FOR ERECTILE DYSFUNCTION. 10 tablet 0  . simvastatin (ZOCOR) 20 MG tablet TAKE 1 TABLET BY MOUTH EVERYDAY AT BEDTIME 90 tablet 0  . spironolactone  (ALDACTONE) 25 MG tablet Take 1 tablet (25 mg total) by mouth daily. NEED FOLLOW UP APPT FOR FUTURE REFILLS  [phone] 90 tablet 0  . verapamil (VERELAN PM) 120 MG 24 hr capsule Take 1 capsule (120 mg total) by mouth at bedtime. 90 capsule 1  . warfarin (COUMADIN) 10 MG tablet TAKE AS DIRECTED BY COUMADIN CLINIC *NEED FOLLOW UP APPOINTMENT 30 tablet 0   No current facility-administered medications for this visit.     On ROS today: 10 system ROS is negative unless otherwise noted in HPI   Physical Examination   Vitals:   02/24/19 1415  BP: 128/80  Pulse: (!) 51  Resp: 18  Temp: (!) 97.1 F (36.2 C)  TempSrc: Temporal  SpO2: 98%  Weight: 231 lb (104.8 kg)  Height: 6' (1.829 m)   Body mass index is 31.33 kg/m.  General Alert, O x 3, WD, NAD  Pulmonary Sym exp, good B air movt, CTA B  Cardiac RRR, Nl S1, S2  Vascular Vessel Right Left  Radial Palpable Palpable  Brachial Palpable Palpable    Gastro- intestinal soft, non-distended, non-tender to palpation,   Musculo- skeletal M/S 5/5 throughout  , Incisions of right arm and left thigh well-healed; easily palpable 2+ right radial pulse; numbness right forearm and hand ulnar nerve distribution  Neurologic Pain and light touch intact in extremities ,  Motor exam as listed above     Medical Decision Making   Mark [name] is a 58 y.o. male who presents with persistent numbness and tingling in right arm and hand status post redo right brachial to brachial artery bypass   Right hand is well-perfused with a easily palpable right radial pulse indicating a patent right arm bypass  Ongoing numbness right arm and especially in the hand is making it difficult for patient to perform his day-to-day work duties  Because the symptoms are making it difficult for him to perform his job duties I will make a referral to occupational therapy to see if they can assist with symptom management and strategies to avoid these symptoms    Emilie RutterMatthew Rhealyn Cullen PA-C [facility] Office: [phone]  Clinic MD: Dr. Randie Heinzain

## 2019-02-28 ENCOUNTER — Ambulatory Visit (INDEPENDENT_AMBULATORY_CARE_PROVIDER_SITE_OTHER): Payer: Managed Care, Other (non HMO) | Admitting: Physician Assistant

## 2019-02-28 ENCOUNTER — Encounter: Payer: Self-pay | Admitting: Physician Assistant

## 2019-02-28 ENCOUNTER — Other Ambulatory Visit: Payer: Self-pay

## 2019-02-28 VITALS — BP 132/70 | HR 53 | Temp 98.6°F | Resp 16 | Wt 226.0 lb

## 2019-02-28 DIAGNOSIS — R197 Diarrhea, unspecified: Secondary | ICD-10-CM | POA: Diagnosis not present

## 2019-02-28 NOTE — Progress Notes (Signed)
Virtual Visit via Video   I connected with patient on 02/28/19 at 10:20 AM EDT by a video enabled telemedicine application and verified that I am speaking with the correct person using two identifiers.  Location patient: Home [facility] provider: [facility] Persons participating in the virtual visit: Patient, Provider, CMA (Patina Moore)  I discussed the limitations of evaluation and management by telemedicine and the availability of in person appointments. The patient expressed understanding and agreed to proceed.  Subjective:   HPI:   Patient presents via Doxy[employer]Me c/o diarrhea noted early this morning. Notes a couple of episodes with non-bloody loose stools. Resolved by 5 AM this morning. Denies fever, chills, nausea or vomiting. Denies recent travel or concern for COVID exposure. Feels is related to some [employer] he had last night. A coworker ate with him yesterday and had a similar experience this morning. Notes he feels well today.  ROS:   See pertinent positives and negatives per HPI.  Patient Active Problem List   Diagnosis Date Noted  . PAD (peripheral artery disease) (HCC) 10/27/2018  . Thoracic aortic aneurysm without rupture (HCC) 10/03/2018  . 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 Obesity (BMI 30-39.9) 09/28/2018  . Physical exam 02/01/2017  . PVC's (premature ventricular contractions) 02/21/2014  . Essential hypertension, benign 11/15/2013  . Closed L1 vertebral fracture (HCC) 05/29/2013  . Abnormal surgical wound 02/07/2013  . Chronic cholecystitis with calculus 01/03/2013  . Labyrinthitis 09/13/2012  . Sleep apnea, obstructive 03/16/2012  . Seasonal allergic rhinitis 12/25/2011  . Aneurysm of ascending aorta (HCC) 09/28/2011  . Palpitations 09/18/2011  . Long term (current) use of anticoagulants 12/11/2010  . Hyperlipidemia 12/11/2010  . Aortic stenosis   . 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 Atrial fibrillation (HCC)  05/21/2010  . ANKLE SPRAIN, LEFT 04/28/2010  . HEADACHE 04/18/2010  . 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 HEMOPTYSIS 11/21/2008    Social History   Tobacco Use  . Smoking status: Never Smoker  . Smokeless tobacco: Never Used  Substance Use Topics  . Alcohol use: No    Current Outpatient Medications:  .  aspirin EC 81 MG tablet, Take 81 mg by mouth at bedtime. , Disp: , Rfl:  .  carvedilol (COREG) 6.25 MG tablet, TAKE 1 TABLET BY MOUTH TWICE A DAY, Disp: 180 tablet, Rfl: 1 .  diltiazem (CARDIZEM) 60 MG tablet, Take one tablet by mouth every 6 hours as needed for afib (Patient taking differently: Take 60 mg by mouth every 6 (six) hours as needed (afib). ), Disp: 30 tablet, Rfl: 1 .  esomeprazole (NEXIUM) 20 MG capsule, Take 20 mg by mouth at bedtime., Disp: , Rfl:  .  ibuprofen (ADVIL,MOTRIN) 200 MG tablet, Take 400-600 mg by mouth every 8 (eight) hours as needed (pain.)., Disp: , Rfl:  .  lisinopril (PRINIVIL,ZESTRIL) 40 MG tablet, TAKE 1 TABLET BY MOUTH EVERY DAY (Patient taking differently: Take 40 mg by mouth at bedtime. ), Disp: 90 tablet, Rfl: 1 .  meclizine (ANTIVERT) 25 MG tablet, Take 1 tablet (25 mg total) by mouth 3 (three) times daily as needed for dizziness., Disp: 30 tablet, Rfl: 0 .  sildenafil (VIAGRA) 50 MG tablet, TAKE 1 TABLET BY MOUTH DAILY AS NEEDED FOR ERECTILE DYSFUNCTION., Disp: 10 tablet, Rfl: 0 .  simvastatin (ZOCOR) 20 MG tablet, TAKE 1 TABLET BY MOUTH EVERYDAY AT BEDTIME, Disp: 90 tablet, Rfl: 0 .  spironolactone (ALDACTONE) 25 MG tablet, Take 1 tablet (25 mg total)  by mouth daily. NEED FOLLOW UP APPT FOR FUTURE REFILLS  [phone], Disp: 90 tablet, Rfl: 0 .  verapamil (VERELAN PM) 120 MG 24 hr capsule, Take 1 capsule (120 mg total) by mouth at bedtime., Disp: 90 capsule, Rfl: 1 .  warfarin (COUMADIN) 10 MG tablet, TAKE AS DIRECTED BY COUMADIN CLINIC *NEED FOLLOW UP APPOINTMENT, Disp: 30 tablet, Rfl: 0  Allergies  Allergen Reactions  . Codeine Nausea  And Vomiting  . Morphine And Related Nausea And Vomiting  . Percocet [Oxycodone-Acetaminophen] Nausea And Vomiting    Objective:   BP 132/70   Pulse (!) 53   Temp 98.6 F (37 C) (Oral)   Resp 16   Wt 226 lb (102.5 kg)   BMI 30.65 kg/m   Patient is well-developed, well-nourished in no acute distress.  Resting comfortably at home.  Head is normocephalic, atraumatic.  No labored breathing.  Speech is clear and coherent with logical contest.  Patient is alert and oriented at baseline.   Assessment and Plan:   1. Diarrhea, unspecified type Early morning only. Resolved. Likely meal related. Patient to monitor symptoms. Work note written.    Leeanne Rio, PA-C 02/28/2019

## 2019-02-28 NOTE — Progress Notes (Signed)
I have discussed the procedure for the virtual visit with the patient who has given consent to proceed with assessment and treatment.   Shonte Beutler S Glenford Garis, CMA     

## 2019-03-01 ENCOUNTER — Encounter: Payer: Self-pay | Admitting: *Deleted

## 2019-03-07 ENCOUNTER — Other Ambulatory Visit (HOSPITAL_COMMUNITY): Payer: Self-pay | Admitting: Internal Medicine

## 2019-03-14 ENCOUNTER — Telehealth: Payer: Self-pay

## 2019-03-14 NOTE — Telephone Encounter (Signed)
lmom for prescreen  

## 2019-03-15 ENCOUNTER — Ambulatory Visit (INDEPENDENT_AMBULATORY_CARE_PROVIDER_SITE_OTHER): Payer: Managed Care, Other (non HMO) | Admitting: *Deleted

## 2019-03-15 DIAGNOSIS — I4891 Unspecified atrial fibrillation: Secondary | ICD-10-CM | POA: Diagnosis not present

## 2019-03-16 LAB — CUP PACEART REMOTE DEVICE CHECK
Date Time Interrogation Session: 20200715224100
Implantable Pulse Generator Implant Date: 20180807

## 2019-03-19 ENCOUNTER — Other Ambulatory Visit (HOSPITAL_COMMUNITY): Payer: Self-pay | Admitting: Internal Medicine

## 2019-03-20 ENCOUNTER — Other Ambulatory Visit: Payer: Self-pay | Admitting: Internal Medicine

## 2019-03-20 NOTE — Telephone Encounter (Signed)
Pt is overdue for appt. Left message on the voicemail for the pt to call back and schedule. Will await a response from the pt.

## 2019-03-23 ENCOUNTER — Ambulatory Visit (INDEPENDENT_AMBULATORY_CARE_PROVIDER_SITE_OTHER): Payer: Managed Care, Other (non HMO) | Admitting: *Deleted

## 2019-03-23 ENCOUNTER — Other Ambulatory Visit: Payer: Self-pay

## 2019-03-23 DIAGNOSIS — Z952 Presence of prosthetic heart valve: Secondary | ICD-10-CM

## 2019-03-23 DIAGNOSIS — I4891 Unspecified atrial fibrillation: Secondary | ICD-10-CM

## 2019-03-23 LAB — POCT INR: INR: 1.5 — AB (ref 2.0–3.0)

## 2019-03-23 NOTE — Patient Instructions (Signed)
Description   Take 1.5 tablets today and 1 tablet tomorrow, then continue 10 mg daily except 5mg  on Fridays. Recheck in 1 week Coumadin Clinic [phone].

## 2019-03-24 NOTE — Progress Notes (Signed)
Carelink Summary Report / Loop Recorder 

## 2019-03-28 ENCOUNTER — Other Ambulatory Visit: Payer: Self-pay

## 2019-03-28 ENCOUNTER — Telehealth: Payer: Self-pay | Admitting: *Deleted

## 2019-03-28 ENCOUNTER — Ambulatory Visit: Payer: Managed Care, Other (non HMO) | Attending: Vascular Surgery | Admitting: Occupational Therapy

## 2019-03-28 DIAGNOSIS — M79601 Pain in right arm: Secondary | ICD-10-CM | POA: Diagnosis present

## 2019-03-28 DIAGNOSIS — R208 Other disturbances of skin sensation: Secondary | ICD-10-CM | POA: Diagnosis present

## 2019-03-28 NOTE — Telephone Encounter (Signed)
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
## 2019-04-01 ENCOUNTER — Other Ambulatory Visit: Payer: Self-pay | Admitting: Family Medicine

## 2019-04-05 ENCOUNTER — Ambulatory Visit: Payer: Managed Care, Other (non HMO) | Admitting: Orthopaedic Surgery

## 2019-04-06 ENCOUNTER — Ambulatory Visit (INDEPENDENT_AMBULATORY_CARE_PROVIDER_SITE_OTHER): Payer: Managed Care, Other (non HMO) | Admitting: Orthopaedic Surgery

## 2019-04-06 ENCOUNTER — Encounter: Payer: Self-pay | Admitting: Orthopaedic Surgery

## 2019-04-06 ENCOUNTER — Other Ambulatory Visit (HOSPITAL_COMMUNITY): Payer: Self-pay | Admitting: Internal Medicine

## 2019-04-06 ENCOUNTER — Other Ambulatory Visit: Payer: Self-pay

## 2019-04-06 ENCOUNTER — Ambulatory Visit (INDEPENDENT_AMBULATORY_CARE_PROVIDER_SITE_OTHER): Payer: Managed Care, Other (non HMO)

## 2019-04-06 VITALS — BP 126/86 | HR 57 | Ht 72.0 in | Wt 228.0 lb

## 2019-04-06 DIAGNOSIS — M25562 Pain in left knee: Secondary | ICD-10-CM

## 2019-04-06 DIAGNOSIS — R2242 Localized swelling, mass and lump, left lower limb: Secondary | ICD-10-CM

## 2019-04-06 DIAGNOSIS — G8929 Other chronic pain: Secondary | ICD-10-CM
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
## 2019-04-11 ENCOUNTER — Telehealth: Payer: Self-pay | Admitting: Orthopaedic Surgery

## 2019-04-11 NOTE — Telephone Encounter (Signed)
Called to patient to discuss surgery date for knee mass excision with Dr Durward Fortes at [facility].  He is unable to schedule at this time and will return call later on this afternoon.

## 2019-04-17 ENCOUNTER — Ambulatory Visit (INDEPENDENT_AMBULATORY_CARE_PROVIDER_SITE_OTHER): Payer: Managed Care, Other (non HMO) | Admitting: *Deleted

## 2019-04-17 DIAGNOSIS — I4891 Unspecified atrial fibrillation: Secondary | ICD-10-CM

## 2019-04-18 ENCOUNTER — Ambulatory Visit: Payer: Managed Care, Other (non HMO) | Admitting: Orthopaedic Surgery

## 2019-04-18 LAB — CUP PACEART REMOTE DEVICE CHECK
Date Time Interrogation Session: 20200817224137
Implantable Pulse Generator Implant Date: 20180807

## 2019-04-19 NOTE — Telephone Encounter (Signed)
Please advise 

## 2019-04-19 NOTE — Telephone Encounter (Signed)
Spoke with patient and relayed information. 

## 2019-04-19 NOTE — Telephone Encounter (Signed)
Might need to be off work for several weeks to allow knee wound to heal. Will take a little longer than expected because he is needing to take blood thinners

## 2019-04-19 NOTE — Telephone Encounter (Signed)
Patient would like to know prior to scheduling surgery how long he would be out of work.  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 Please advise.     Pt's cb  [phone]

## 2019-04-22 ENCOUNTER — Other Ambulatory Visit: Payer: Self-pay | Admitting: Internal Medicine

## 2019-04-24 NOTE — Telephone Encounter (Signed)
Pt is overdue for INR and needs an appt. Scheduled pt for an appt on Friday as requested.

## 2019-04-25 NOTE — Progress Notes (Signed)
Carelink Summary Report / Loop Recorder 

## 2019-04-28 ENCOUNTER — Encounter: Payer: Self-pay | Admitting: Family Medicine

## 2019-04-28 ENCOUNTER — Ambulatory Visit (INDEPENDENT_AMBULATORY_CARE_PROVIDER_SITE_OTHER): Payer: Managed Care, Other (non HMO)

## 2019-04-28 ENCOUNTER — Ambulatory Visit (INDEPENDENT_AMBULATORY_CARE_PROVIDER_SITE_OTHER): Payer: Managed Care, Other (non HMO) | Admitting: Family Medicine

## 2019-04-28 ENCOUNTER — Other Ambulatory Visit: Payer: Self-pay

## 2019-04-28 VITALS — BP 124/73 | HR 58 | Ht 71.0 in | Wt 226.0 lb

## 2019-04-28 DIAGNOSIS — Z7901 Long term (current) use of anticoagulants: Secondary | ICD-10-CM

## 2019-04-28 DIAGNOSIS — E785 Hyperlipidemia, unspecified: Secondary | ICD-10-CM

## 2019-04-28 DIAGNOSIS — Z952 Presence of prosthetic heart valve: Secondary | ICD-10-CM

## 2019-04-28 DIAGNOSIS — E669 Obesity, unspecified: Secondary | ICD-10-CM | POA: Diagnosis not present

## 2019-04-28 DIAGNOSIS — I5032 Chronic diastolic (congestive) heart failure: Secondary | ICD-10-CM | POA: Diagnosis not present

## 2019-04-28 DIAGNOSIS — I4891 Unspecified atrial fibrillation: Secondary | ICD-10-CM

## 2019-04-28 LAB — POCT INR: INR: 5.8 — AB (ref 2.0–3.0)

## 2019-04-28 NOTE — Patient Instructions (Signed)
Description   Skip today and tomorrow's dosage of Coumadin, then start taking 10 mg daily except 5mg  on Mondays and Fridays. Recheck in 10 days. [facility] [phone].

## 2019-04-28 NOTE — Progress Notes (Signed)
I have discussed the procedure for the virtual visit with the patient who has given consent to proceed with assessment and treatment.   Jessica L Brodmerkel, CMA     

## 2019-04-28 NOTE — Progress Notes (Signed)
Virtual Visit via Video   I connected with patient on 04/28/19 at  9:00 AM EDT by a video enabled telemedicine application and verified that I am speaking with the correct person using two identifiers.  Location patient: Home Location provider: [facility] Persons participating in the virtual visit: Patient, Provider, CMA (Jess B)  I discussed the limitations of evaluation and management by telemedicine and the availability of in person appointments. The patient expressed understanding and agreed to proceed.  Subjective:   HPI:  Hyperlipidemia- chronic problem, on Simvastatin 20mg  daily.  No abd pain, N/V.  Obesity- BMI is 31.52.  Weight is stable.  Very active job, walking regularly, 'trying to lose the weight'.  Not following any particular diet.  Goal is 210 by end of year.  Chronic heart failure- ongoing issue.  Following w/ Cardiology (overdue for appt).  On beta blocker, ACE, Spironolactone and anticoagulated.  Plans to schedule appt.  No CP, SOB.  Trace edema in ankles after working 20 hrs.  'if it's a normal day I don't swell'.  ROS:   See pertinent positives and negatives per HPI.  Patient Active Problem List   Diagnosis Date Noted  . Mass of left knee 04/06/2019  . PAD (peripheral artery disease) (HCC) 10/27/2018  . Thoracic aortic aneurysm without rupture (HCC) 10/03/2018  . 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 Obesity (BMI 30-39.9) 09/28/2018  . Physical exam 02/01/2017  . PVC's (premature ventricular contractions) 02/21/2014  . Essential hypertension, benign 11/15/2013  . Closed L1 vertebral fracture (HCC) 05/29/2013  . Abnormal surgical wound 02/07/2013  . Chronic cholecystitis with calculus 01/03/2013  . Labyrinthitis 09/13/2012  . Sleep apnea, obstructive 03/16/2012  . Seasonal allergic rhinitis 12/25/2011  . Aneurysm of ascending aorta (HCC) 09/28/2011  . Palpitations 09/18/2011  . Long term (current) use of  anticoagulants 12/11/2010  . Hyperlipidemia 12/11/2010  . Aortic stenosis   . 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 Atrial fibrillation (HCC) 05/21/2010  . ANKLE SPRAIN, LEFT 04/28/2010  . HEADACHE 04/18/2010  . 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 HEMOPTYSIS 11/21/2008    Social History   Tobacco Use  . Smoking status: Never Smoker  . Smokeless tobacco: Never Used  Substance Use Topics  . Alcohol use: No    Current Outpatient Medications:  .  aspirin EC 81 MG tablet, Take 81 mg by mouth at bedtime. , Disp: , Rfl:  .  carvedilol (COREG) 6.25 MG tablet, TAKE 1 TABLET BY MOUTH TWICE A DAY, Disp: 180 tablet, Rfl: 1 .  diltiazem (CARDIZEM) 60 MG tablet, Take one tablet by mouth every 6 hours as needed for afib (Patient taking differently: Take 60 mg by mouth every 6 (six) hours as needed (afib). ), Disp: 30 tablet, Rfl: 1 .  esomeprazole (NEXIUM) 20 MG capsule, Take 20 mg by mouth at bedtime., Disp: , Rfl:  .  ibuprofen (ADVIL,MOTRIN) 200 MG tablet, Take 400-600 mg by mouth every 8 (eight) hours as needed (pain.)., Disp: , Rfl:  .  lisinopril (ZESTRIL) 40 MG tablet, Take 1 tablet (40 mg total) by mouth daily. Please schedule an appointment for further refills, Disp: 90 tablet, Rfl: 0 .  meclizine (ANTIVERT) 25 MG tablet, Take 1 tablet (25 mg total) by mouth 3 (three) times daily as needed for dizziness., Disp: 30 tablet, Rfl: 0 .  simvastatin (ZOCOR) 20 MG tablet, TAKE 1 TABLET BY MOUTH EVERYDAY AT BEDTIME, Disp: 90 tablet, Rfl: 0 .  spironolactone (ALDACTONE) 25  MG tablet, TAKE 1 TABLET BY MOUTH DAILY. NEED FOLLOW UP APPT FOR FUTURE REFILLS [phone], Disp: 90 tablet, Rfl: 0 .  verapamil (VERELAN PM) 120 MG 24 hr capsule, Take 1 capsule (120 mg total) by mouth at bedtime. Please schedule an appointment for further refills, Disp: 90 capsule, Rfl: 0 .  warfarin (COUMADIN) 10 MG tablet, TAKE AS DIRECTED BY COUMADIN CLINIC *NEED FOLLOW UP APPOINTMENT, Disp: 32 tablet, Rfl: 0   Allergies  Allergen Reactions  . Codeine Nausea And Vomiting  . Morphine And Related Nausea And Vomiting  . Percocet [Oxycodone-Acetaminophen] Nausea And Vomiting    Objective:   BP 124/73   Pulse (!) 58   Ht 5\' 11"  (1.803 m)   Wt 226 lb (102.5 kg)   BMI 31.52 kg/m  AAOx3, NAD NCAT, EOMI No obvious CN deficits Coloring WNL Pt is able to speak clearly, coherently without shortness of breath or increased work of breathing.  Thought process is linear.  Mood is appropriate.   Assessment and Plan:   Hyperlipidemia- chronic problem.  Tolerating statin w/o difficulty.  Applauded his efforts at regular exercise.  Check labs.  Adjust meds prn   Obesity- pt has a goal of 210 by the end of the year.  Applauded his efforts at exercise and encouraged him to improve his diet to help speed the process.  Will continue to follow.  CHF- ongoing issue.  Due for f/u w/ Cardiology.  Encouraged pt to schedule appt.  Currently asymptomatic.  Will follow along and assist as able.   Mark Asa, MD 04/28/2019

## 2019-05-09 ENCOUNTER — Other Ambulatory Visit: Payer: Self-pay

## 2019-05-09 ENCOUNTER — Ambulatory Visit: Payer: Managed Care, Other (non HMO)

## 2019-05-09 ENCOUNTER — Ambulatory Visit (INDEPENDENT_AMBULATORY_CARE_PROVIDER_SITE_OTHER): Payer: Managed Care, Other (non HMO) | Admitting: *Deleted

## 2019-05-09 DIAGNOSIS — I4891 Unspecified atrial fibrillation: Secondary | ICD-10-CM

## 2019-05-09 DIAGNOSIS — Z952 Presence of prosthetic heart valve: Secondary | ICD-10-CM | POA: Diagnosis not present

## 2019-05-09 LAB — POCT INR: INR: 2.1 (ref 2.0–3.0)

## 2019-05-09 NOTE — Patient Instructions (Addendum)
Description   Take an extra 1/2 a tablet today and tomorrow, then continue taking 10 mg daily except 5mg  on Mondays and Fridays. Per pt request will schedule him for 05/19/2019. [facility]in Clinic [phone].

## 2019-05-19 ENCOUNTER — Ambulatory Visit (INDEPENDENT_AMBULATORY_CARE_PROVIDER_SITE_OTHER): Payer: Managed Care, Other (non HMO)

## 2019-05-19 ENCOUNTER — Other Ambulatory Visit: Payer: Self-pay

## 2019-05-19 DIAGNOSIS — Z7901 Long term (current) use of anticoagulants: Secondary | ICD-10-CM | POA: Diagnosis not present

## 2019-05-19 DIAGNOSIS — I4891 Unspecified atrial fibrillation: Secondary | ICD-10-CM | POA: Diagnosis not present

## 2019-05-19 DIAGNOSIS — Z952 Presence of prosthetic heart valve: Secondary | ICD-10-CM | POA: Diagnosis not present

## 2019-05-19 LAB — POCT INR: INR: 2.9 (ref 2.0–3.0)

## 2019-05-19 NOTE — Patient Instructions (Signed)
Description   Take 15mg  today, then start taking 10 mg daily except 5mg  on Fridays. Recheck in 2 weeks. [facility]in Clinic [phone].

## 2019-05-21 LAB — CUP PACEART REMOTE DEVICE CHECK
Date Time Interrogation Session: 20200919233804
Implantable Pulse Generator Implant Date: 20180807

## 2019-05-22 ENCOUNTER — Other Ambulatory Visit: Payer: Self-pay | Admitting: Internal Medicine

## 2019-05-22 ENCOUNTER — Ambulatory Visit (INDEPENDENT_AMBULATORY_CARE_PROVIDER_SITE_OTHER): Payer: Managed Care, Other (non HMO) | Admitting: *Deleted

## 2019-05-22 DIAGNOSIS — I4891 Unspecified atrial fibrillation: Secondary | ICD-10-CM | POA: Diagnosis not present

## 2019-05-29 ENCOUNTER — Other Ambulatory Visit (HOSPITAL_COMMUNITY): Payer: Self-pay | Admitting: Internal Medicine

## 2019-05-29 NOTE — Progress Notes (Signed)
Carelink Summary Report / Loop Recorder 

## 2019-06-02 ENCOUNTER — Ambulatory Visit (INDEPENDENT_AMBULATORY_CARE_PROVIDER_SITE_OTHER): Payer: Managed Care, Other (non HMO) | Admitting: *Deleted

## 2019-06-02 ENCOUNTER — Other Ambulatory Visit: Payer: Self-pay

## 2019-06-02 DIAGNOSIS — Z952 Presence of prosthetic heart valve: Secondary | ICD-10-CM

## 2019-06-02 DIAGNOSIS — I4891 Unspecified atrial fibrillation: Secondary | ICD-10-CM

## 2019-06-02 LAB — POCT INR: INR: 4.4 — AB (ref 2.0–3.0)

## 2019-06-02 NOTE — Patient Instructions (Signed)
Description   Continue taking 10 mg daily except 5mg  on Fridays. Recheck in 3 weeks. Coumadin Clinic [phone].

## 2019-06-08 ENCOUNTER — Other Ambulatory Visit (HOSPITAL_COMMUNITY): Payer: Self-pay | Admitting: Internal Medicine

## 2019-06-17 ENCOUNTER — Other Ambulatory Visit: Payer: Self-pay | Admitting: Internal Medicine

## 2019-06-22 ENCOUNTER — Ambulatory Visit (INDEPENDENT_AMBULATORY_CARE_PROVIDER_SITE_OTHER): Payer: Managed Care, Other (non HMO) | Admitting: *Deleted

## 2019-06-22 DIAGNOSIS — I4891 Unspecified atrial fibrillation: Secondary | ICD-10-CM

## 2019-06-22 DIAGNOSIS — I5032 Chronic diastolic (congestive) heart failure: Secondary | ICD-10-CM

## 2019-06-24 LAB — CUP PACEART REMOTE DEVICE CHECK
Date Time Interrogation Session: 20201022234013
Implantable Pulse Generator Implant Date: 20180807

## 2019-07-03 ENCOUNTER — Other Ambulatory Visit: Payer: Self-pay | Admitting: Family Medicine

## 2019-07-03 ENCOUNTER — Other Ambulatory Visit (HOSPITAL_COMMUNITY): Payer: Self-pay | Admitting: Internal Medicine

## 2019-07-03 NOTE — Progress Notes (Signed)
Remote pacemaker transmission.   

## 2019-07-13 ENCOUNTER — Encounter (HOSPITAL_COMMUNITY): Payer: Managed Care, Other (non HMO) | Admitting: Internal Medicine

## 2019-07-15 ENCOUNTER — Other Ambulatory Visit: Payer: Self-pay | Admitting: Internal Medicine

## 2019-07-17 ENCOUNTER — Other Ambulatory Visit: Payer: Self-pay | Admitting: Internal Medicine

## 2019-07-17 NOTE — Telephone Encounter (Signed)
Pt is overdue for an appointment. Called pt and LMOM.

## 2019-07-18 NOTE — Telephone Encounter (Signed)
Spoke with pt who stated that he dose not ned a refill at this time. Was able to schedule pt for an appointment on 07/20/2019. Pt stated he will let us know when he needs a refill.

## 2019-07-25 ENCOUNTER — Ambulatory Visit (INDEPENDENT_AMBULATORY_CARE_PROVIDER_SITE_OTHER): Payer: Managed Care, Other (non HMO) | Admitting: *Deleted

## 2019-07-25 DIAGNOSIS — I4891 Unspecified atrial fibrillation: Secondary | ICD-10-CM

## 2019-07-26 LAB — CUP PACEART REMOTE DEVICE CHECK
Date Time Interrogation Session: 20201124183929
Implantable Pulse Generator Implant Date: 20180807

## 2019-08-06 ENCOUNTER — Other Ambulatory Visit: Payer: Self-pay

## 2019-08-06 ENCOUNTER — Encounter: Payer: Self-pay | Admitting: Emergency Medicine

## 2019-08-06 ENCOUNTER — Ambulatory Visit
Admission: EM | Admit: 2019-08-06 | Discharge: 2019-08-06 | Disposition: A | Discharge status: Home / Self Care | Payer: Self-pay | Attending: Emergency Medicine | Admitting: Emergency Medicine

## 2019-08-06 DIAGNOSIS — L03116 Cellulitis of left lower limb: Secondary | ICD-10-CM

## 2019-08-06 MED ORDER — DOXYCYCLINE HYCLATE 100 MG PO CAPS: 100.0000 mg | ORAL_CAPSULE | Freq: Two times a day (BID) | ORAL | 0 refills | Status: AC

## 2019-08-06 NOTE — ED Notes (Signed)
Patient able to ambulate independently  
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
## 2019-08-06 NOTE — ED Triage Notes (Signed)
Pt presents to [facility] [place] for assessment of left thigh redness, hardening since waking this morning.

## 2019-08-06 NOTE — Discharge Instructions (Signed)
Symptomatic as directed.   Recommend applying hot compresses to increase blood flow to the area, help break down any inflammation. Return for worsening pain, swelling, discharge, fever, joint pain chest pain.

## 2019-08-09 ENCOUNTER — Telehealth: Payer: Self-pay | Admitting: *Deleted

## 2019-08-09 NOTE — Telephone Encounter (Signed)
LMOVM requesting call back to DC. Direct number and office hours provided.  LINQ alert received for 4.3sec duration pause episode on 08/08/19 at 04:06. Occurred during AF episode. Presenting ECG as of 12/9 at 04:50 shows NSR. AF burden stable at 4.4%. Will ask patient if symptomatic with episode.

## 2019-08-11 ENCOUNTER — Other Ambulatory Visit: Payer: Self-pay

## 2019-08-11 ENCOUNTER — Ambulatory Visit (INDEPENDENT_AMBULATORY_CARE_PROVIDER_SITE_OTHER): Payer: Self-pay

## 2019-08-11 DIAGNOSIS — E785 Hyperlipidemia, unspecified: Secondary | ICD-10-CM

## 2019-08-11 LAB — HEPATIC FUNCTION PANEL
ALT: 23 U/L (ref 0–53)
AST: 19 U/L (ref 0–37)
Albumin: 4.2 g/dL (ref 3.5–5.2)
Alkaline Phosphatase: 51 U/L (ref 39–117)
Bilirubin, Direct: 0.1 mg/dL (ref 0.0–0.3)
Total Bilirubin: 1 mg/dL (ref 0.2–1.2)
Total Protein: 6.6 g/dL (ref 6.0–8.3)

## 2019-08-11 LAB — CBC WITH DIFFERENTIAL/PLATELET
Basophils Absolute: 0.1 10*3/uL (ref 0.0–0.1)
Basophils Relative: 0.9 % (ref 0.0–3.0)
Eosinophils Absolute: 0.1 10*3/uL (ref 0.0–0.7)
Eosinophils Relative: 1.9 % (ref 0.0–5.0)
HCT: 38.9 % — ABNORMAL LOW (ref 39.0–52.0)
Hemoglobin: 13.3 g/dL (ref 13.0–17.0)
Lymphocytes Relative: 32 % (ref 12.0–46.0)
Lymphs Abs: 2 10*3/uL (ref 0.7–4.0)
MCHC: 34.2 g/dL (ref 30.0–36.0)
MCV: 91.8 fl (ref 78.0–100.0)
Monocytes Absolute: 0.4 10*3/uL (ref 0.1–1.0)
Monocytes Relative: 6.9 % (ref 3.0–12.0)
Neutro Abs: 3.6 10*3/uL (ref 1.4–7.7)
Neutrophils Relative %: 58.3 % (ref 43.0–77.0)
Platelets: 226 10*3/uL (ref 150.0–400.0)
RBC: 4.24 Mil/uL (ref 4.22–5.81)
RDW: 12.7 % (ref 11.5–15.5)
WBC: 6.3 10*3/uL (ref 4.0–10.5)

## 2019-08-11 LAB — BASIC METABOLIC PANEL
BUN: 18 mg/dL (ref 6–23)
CO2: 26 mEq/L (ref 19–32)
Calcium: 8.9 mg/dL (ref 8.4–10.5)
Chloride: 105 mEq/L (ref 96–112)
Creatinine, Ser: 0.95 mg/dL (ref 0.40–1.50)
GFR: 81.96 mL/min (ref >60.00)
Glucose, Bld: 93 mg/dL (ref 70–99)
Potassium: 4.5 mEq/L (ref 3.5–5.1)
Sodium: 138 mEq/L (ref 135–145)

## 2019-08-11 LAB — LIPID PANEL
Cholesterol: 170 mg/dL (ref 0–200)
HDL: 33.6 mg/dL — ABNORMAL LOW (ref >39.00)
LDL Cholesterol: 102 mg/dL — ABNORMAL HIGH (ref 0–99)
NonHDL: 136.02
Total CHOL/HDL Ratio: 5
Triglycerides: 171 mg/dL — ABNORMAL HIGH (ref 0.0–149.0)
VLDL: 34.2 mg/dL (ref 0.0–40.0)

## 2019-08-11 LAB — TSH: TSH: 1.22 u[IU]/mL (ref 0.35–4.50)

## 2019-08-14 NOTE — Telephone Encounter (Signed)
Spoke with patient. He was asleep at the time of the episode, denies presyncope/syncope. Reports he was symptomatic with AF episode on 12/8, took PRN diltiazem twice that day. Pt reports episode lasted ~20hrs, ILR estimates ~15hrs per cardiac compass. He has felt well since then and reports he is overall doing well. Pt is aware to call our office for presyncopal/syncopal episodes, or for persistent AF episodes. No further questions or concerns at this time.  Routed to Dr. Rayann Heman for review.

## 2019-08-18 ENCOUNTER — Ambulatory Visit (INDEPENDENT_AMBULATORY_CARE_PROVIDER_SITE_OTHER): Payer: Self-pay | Admitting: *Deleted

## 2019-08-18 ENCOUNTER — Other Ambulatory Visit: Payer: Self-pay

## 2019-08-18 DIAGNOSIS — Z5181 Encounter for therapeutic drug level monitoring: Secondary | ICD-10-CM

## 2019-08-18 DIAGNOSIS — Z952 Presence of prosthetic heart valve: Secondary | ICD-10-CM

## 2019-08-18 DIAGNOSIS — I4891 Unspecified atrial fibrillation: Secondary | ICD-10-CM

## 2019-08-18 LAB — POCT INR: INR: 3.7 — AB (ref 2.0–3.0)

## 2019-08-18 NOTE — Patient Instructions (Signed)
Description   Continue taking 10 mg daily except 5mg  on Fridays. Recheck in 4 weeks. Coumadin Clinic [phone].

## 2019-08-21 ENCOUNTER — Other Ambulatory Visit (HOSPITAL_COMMUNITY): Payer: Self-pay | Admitting: Internal Medicine

## 2019-08-24 ENCOUNTER — Other Ambulatory Visit: Payer: Self-pay

## 2019-08-24 ENCOUNTER — Ambulatory Visit (HOSPITAL_COMMUNITY)
Admission: RE | Admit: 2019-08-24 | Discharge: 2019-08-24 | Disposition: A | Discharge status: Home / Self Care | Payer: Self-pay | Source: Ambulatory Visit | Attending: Internal Medicine | Admitting: Internal Medicine

## 2019-08-24 ENCOUNTER — Encounter (HOSPITAL_COMMUNITY): Payer: Self-pay | Admitting: Internal Medicine

## 2019-08-24 VITALS — BP 118/71 | HR 59 | Wt 236.4 lb

## 2019-08-24 DIAGNOSIS — I350 Nonrheumatic aortic (valve) stenosis: Secondary | ICD-10-CM | POA: Insufficient documentation

## 2019-08-24 DIAGNOSIS — Q2543 Congenital aneurysm of aorta: Secondary | ICD-10-CM | POA: Insufficient documentation

## 2019-08-24 DIAGNOSIS — I5032 Chronic diastolic (congestive) heart failure: Secondary | ICD-10-CM | POA: Insufficient documentation

## 2019-08-24 DIAGNOSIS — Z7982 Long term (current) use of aspirin: Secondary | ICD-10-CM | POA: Insufficient documentation

## 2019-08-24 DIAGNOSIS — Z952 Presence of prosthetic heart valve: Secondary | ICD-10-CM | POA: Insufficient documentation

## 2019-08-24 DIAGNOSIS — E785 Hyperlipidemia, unspecified: Secondary | ICD-10-CM | POA: Insufficient documentation

## 2019-08-24 DIAGNOSIS — I480 Paroxysmal atrial fibrillation: Secondary | ICD-10-CM | POA: Insufficient documentation

## 2019-08-24 DIAGNOSIS — G4733 Obstructive sleep apnea (adult) (pediatric): Secondary | ICD-10-CM | POA: Insufficient documentation

## 2019-08-24 DIAGNOSIS — Z79899 Other long term (current) drug therapy: Secondary | ICD-10-CM | POA: Insufficient documentation

## 2019-08-24 DIAGNOSIS — Z7901 Long term (current) use of anticoagulants: Secondary | ICD-10-CM | POA: Insufficient documentation

## 2019-08-24 DIAGNOSIS — I110 Hypertensive heart disease with heart failure: Secondary | ICD-10-CM | POA: Insufficient documentation

## 2019-08-24 DIAGNOSIS — I2510 Atherosclerotic heart disease of native coronary artery without angina pectoris: Secondary | ICD-10-CM | POA: Insufficient documentation

## 2019-08-24 DIAGNOSIS — Z9119 Patient's noncompliance with other medical treatment and regimen: Secondary | ICD-10-CM | POA: Insufficient documentation

## 2019-08-24 NOTE — Addendum Note (Signed)
Encounter addended by: Valeda Malm, RN on: 08/24/2019 11:54 AM  Actions taken: Visit diagnoses modified, Order list changed, Diagnosis association updated, Clinical Note Signed

## 2019-08-24 NOTE — Patient Instructions (Signed)
NO medication changes today!!  Your physician has requested that you have an echocardiogram. Echocardiography is a painless test that uses sound waves to create images of your heart. It provides your doctor with information about the size and shape of your heart and how well your heart's chambers and valves are working. This procedure takes approximately one hour. There are no restrictions for this procedure.   Your physician recommends that you schedule a follow-up appointment in: 1 year with Dr Kendrick Ranch. You will receive a call in the fall of 2021 to make this appointment. Please call if you do not receive this call  At the [facility], you and your health needs are our priority. As part of our continuing mission to provide you with exceptional heart care, we have created designated Provider Care Teams. These Care Teams include your primary Cardiologist (physician) and Advanced Practice Providers (APPs- Physician Assistants and Nurse Practitioners) who all work together to provide you with the care you need, when you need it.   You may see any of the following providers on your designated Care Team at your next follow up: Marland Kitchen Dr Glori Bickers . Dr Loralie Champagne . Darrick Grinder, NP . Lyda Jester, PA . Audry Riles, PharmD   Please be sure to bring in all your medications bottles to every appointment.

## 2019-08-24 NOTE — Progress Notes (Addendum)
Patient ID: Jacquel Mccamish, male   DOB: [birth year], 58 y.o.   MRN: [id number]    Advanced Heart Failure Clinic Note   PCP: [name] HF: Dr. Gala Romney   HPI:  [name] is a very pleasant 58 year old male with a history of bicuspid aortic valve complicated by endocarditis.  He is status post St. Jude aortic valve replacement in 1983.  He also has a history of hyperlipidemia and PAF s/p DC-CV in 9/11. OSA noncompliant with CPAP.   Over the past few years year, he has had some progression in the gradients across his valve.  He underwent cardiac catheterization in May 2009 which showed normal coronary arteries.  We did not cross the valve, obviously this is a mechanical valve.  However, the valve leaflets were seen to be opening well on fluoroscopy.  He did undergo a TEE.  While there was some turbulence around the valve, the leaflets seem to be moving well.  There was no obvious pannus formation.  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 He was seen by Dr. [name] who agreed  with continue watchul waiting.   Last saw Dr. [name] in 4/18 CT chest showed stable aneurysmal dilation of aortic root at 5.0 cm.  Has struggled with AF/palpitations.  Has seen Dr. Johney Frame and had two ablations in 8/13 and 3/14. Now has Linq monitor in (placed 04/06/17). Saw Dr. Johney Frame in 2/20  By Lauretta Grill, AF burden is 3.4% (longest episode was 14 hours 08/28/18).  Last visit, AF burden was 5.1%.   Most recent device interrogation from 9/21 reviewed AF burden 4.8% with longest event 20 hr duration  He returns for regular follow up. Overall doing ok. Says AF is kicking his but has episodes several times per week. Was walking 3 miles per day but then stopped.  Working [employer]. No CP or undue SOB. No edema. No bleeding with warfarin .  Echo 6/19: EF 55-60% AoV Mean gradient (S): 27 mm Hg Personally reviewed  Echo 7/18  EF 55-60% AoV Mean gradient  (S): 26 mm Hg. Peak gradient (S): 51 mm Hg. AVA 1.35 cm2 Echo 12/14/2014 LVEF 55-60%, Aortic valve gradient 44 to 60 mm Hg, increased from 22 to 35 mm Hg. Echo 4/17 EF 60-65%  AoV Mean gradient (S): 34 mm Hg. Peak gradient (S): 63 mm Hg.  Review of systems complete and found to be negative unless listed in HPI.   Past Medical History:  Diagnosis Date  . Aortic stenosis    a. due to bicuspid AoV; 1983 S/p mechanical AVR (St. Jude) - chronic coumadin with INR run between 3.5-4.5;  b. 03/2012 Echo: EF 60%, nl wall motion, mod dil LA.  Marland Kitchen Ascending aortic aneurysm (HCC)   . Atrial tachycardia (HCC)    a. admitted 07/2012  . Coronary artery disease   . ED (erectile dysfunction)   . Gallstones   . H/O cardiac catheterization    a. 12/2007 Cath: nl cors.  . Headache(784.0)   . 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 Hypertension   . OSA (obstructive sleep apnea)    a. noncompliant with CPAP  . Paroxysmal atrial fibrillation (HCC)    s/p afib ablation x 2    Current Outpatient Medications  Medication Sig Dispense Refill  . aspirin EC 81 MG tablet Take 81 mg by mouth at bedtime.     . carvedilol (COREG)  6.25 MG tablet TAKE 1 TABLET BY MOUTH TWICE A DAY 180 tablet 0  . diltiazem (CARDIZEM) 60 MG tablet Take one tablet by mouth every 6 hours as needed for afib (Patient taking differently: Take 60 mg by mouth every 6 (six) hours as needed (afib). ) 30 tablet 1  . esomeprazole (NEXIUM) 20 MG capsule Take 20 mg by mouth at bedtime.    Marland Kitchen ibuprofen (ADVIL,MOTRIN) 200 MG tablet Take 400-600 mg by mouth every 8 (eight) hours as needed (pain.).    Marland Kitchen lisinopril (ZESTRIL) 40 MG tablet TAKE 1 TABLET BY MOUTH EVERY DAY 90 tablet 0  . meclizine (ANTIVERT) 25 MG tablet Take 1 tablet (25 mg total) by mouth 3 (three) times daily as needed for dizziness. 30 tablet 0  . simvastatin (ZOCOR) 20 MG tablet TAKE 1 TABLET BY MOUTH EVERYDAY AT BEDTIME 90 tablet 0  . spironolactone (ALDACTONE) 25 MG tablet TAKE  1 TABLET BY MOUTH DAILY. NEED FOLLOW UP APPT FOR FUTURE REFILLS [phone] 90 tablet 0  . verapamil (VERELAN PM) 120 MG 24 hr capsule TAKE 1 CAPSULE BY MOUTH AT BEDTIME. PLEASE SCHEDULE AN APPOINTMENT FOR FURTHER REFILLS 90 capsule 0  . warfarin (COUMADIN) 10 MG tablet TAKE AS DIRECTED BY COUMADIN CLINIC 35 tablet 0   No current facility-administered medications for this encounter.    PHYSICAL EXAM: Vitals:   08/24/19 1108  BP: 118/71  Pulse: (!) 59  SpO2: 99%  Weight: 107.2 kg (236 lb 6.4 oz)   Wt Readings from Last 3 Encounters:  08/24/19 107.2 kg (236 lb 6.4 oz)  04/28/19 102.5 kg (226 lb)  04/06/19 103.4 kg (228 lb)    General:  Well appearing. No resp difficulty HEENT: normal Neck: supple. no JVD. Carotids 2+ bilat; + bruits. No lymphadenopathy or thryomegaly appreciated. Cor: PMI nondisplaced. Regular rate & rhythm. Crisp mechanical s2. 2/6 AS Lungs: clear Abdomen: soft, nontender, nondistended. No hepatosplenomegaly. No bruits or masses. Good bowel sounds. Extremities: no cyanosis, clubbing, rash, edema Neuro: alert & orientedx3, cranial nerves grossly intact. moves all 4 extremities w/o difficulty. Affect pleasant   ASSESSMENT & PLAN: 1. Aortic stenosis s/p AVR - Echo 03/02/17 EF normal Mean AoV gradient 26.  - Valve sounds good on exam. - Echo 6/19 AVR stable mean gradient 27 - Emphasized use abx for SBE prophylaxis - Continue coumadin and ECASA 81. No bleeding - Will repeat echo in February  2. HTN - Blood pressure well controlled. Continue current regimen. - OSA - wearing CPAP about 2 hours nightly 3. Aortic root aneurysm - Followed by Dr. Roxy Manns. Repeat scan 4/19 - HR stable. BP well controlled. 4. Lipids - Followed by Dr. Birdie Riddle.  5. Atrial fibrillation  - Followed by Dr. Rayann Heman. Has LINQ in place  - Has ILR with afib burden 4.8%.  - not wearing CPAP. Looking into dental appliance  Glori Bickers, MD 11:34 AM

## 2019-08-28 ENCOUNTER — Ambulatory Visit (INDEPENDENT_AMBULATORY_CARE_PROVIDER_SITE_OTHER): Payer: Self-pay | Admitting: *Deleted

## 2019-08-28 DIAGNOSIS — R002 Palpitations: Secondary | ICD-10-CM

## 2019-08-28 LAB — CUP PACEART REMOTE DEVICE CHECK
Date Time Interrogation Session: 20201227183928
Implantable Pulse Generator Implant Date: 20180807

## 2019-08-29 NOTE — Progress Notes (Signed)
ILR remote 

## 2019-09-03 ENCOUNTER — Other Ambulatory Visit (HOSPITAL_COMMUNITY): Payer: Self-pay | Admitting: Internal Medicine

## 2019-09-04 ENCOUNTER — Other Ambulatory Visit (HOSPITAL_COMMUNITY): Payer: Self-pay

## 2019-09-04 ENCOUNTER — Telehealth: Payer: Self-pay | Admitting: Emergency Medicine

## 2019-09-04 MED ORDER — VERAPAMIL HCL ER 120 MG PO CP24: ORAL_CAPSULE | ORAL | 0 refills | Status: DC

## 2019-09-04 NOTE — Telephone Encounter (Signed)
Patient reports he felt that he was in AF and developed dizzinesss, & CP. He reports that he had no syncope or SOB and considered going to ED but decided to see if AF "passed". He took a prn dose of Cardizem as ordered on 08/31/19 and slept through the night. He felt the same symptoms throughout the day on 09/01/19 and took another prn dose of Cardizem on 09/02/19 and AF and sx resoved . Encouraged patient to seek treatment in ED if he has CP, SOB, palpitations or dizziness that is not relieved by Cardizem. ED precautions reviewed  And education done concerning Cardizem prn every 6 hrs prn. Will send remote transmission when he gets home from work today.

## 2019-09-04 NOTE — Telephone Encounter (Addendum)
LMOM. Assess for sx R/T AF episode that started 08/31/19 and lasted 28 hrs. + Coumadin.

## 2019-09-05 NOTE — Telephone Encounter (Signed)
Routed to Dr. Johney Frame for review. AF burden 17.2% since 08/14/19, on warfarin.

## 2019-09-11 ENCOUNTER — Other Ambulatory Visit: Payer: Self-pay | Admitting: Thoracic Surgery (Cardiothoracic Vascular Surgery)

## 2019-09-11 DIAGNOSIS — I712 Thoracic aortic aneurysm, without rupture, unspecified: Secondary | ICD-10-CM

## 2019-09-14 ENCOUNTER — Telehealth: Payer: Self-pay

## 2019-09-14 NOTE — Telephone Encounter (Signed)
LMOVM for the pt to send a transmission with his home monitor.

## 2019-09-14 NOTE — Telephone Encounter (Signed)
Pt states he will send a manual transmission as soon as he get home.

## 2019-09-15 ENCOUNTER — Ambulatory Visit (INDEPENDENT_AMBULATORY_CARE_PROVIDER_SITE_OTHER): Payer: Self-pay

## 2019-09-15 ENCOUNTER — Ambulatory Visit (HOSPITAL_COMMUNITY)
Admission: RE | Admit: 2019-09-15 | Discharge: 2019-09-15 | Disposition: A | Discharge status: Home / Self Care | Payer: Self-pay | Source: Ambulatory Visit | Attending: Nurse Practitioner | Admitting: Nurse Practitioner

## 2019-09-15 ENCOUNTER — Other Ambulatory Visit: Payer: Self-pay

## 2019-09-15 ENCOUNTER — Encounter (HOSPITAL_COMMUNITY): Payer: Self-pay | Admitting: Nurse Practitioner

## 2019-09-15 VITALS — BP 124/70 | HR 56 | Ht 71.0 in | Wt 242.0 lb

## 2019-09-15 DIAGNOSIS — I10 Essential (primary) hypertension: Secondary | ICD-10-CM | POA: Insufficient documentation

## 2019-09-15 DIAGNOSIS — G4733 Obstructive sleep apnea (adult) (pediatric): Secondary | ICD-10-CM | POA: Insufficient documentation

## 2019-09-15 DIAGNOSIS — I480 Paroxysmal atrial fibrillation: Secondary | ICD-10-CM | POA: Insufficient documentation

## 2019-09-15 DIAGNOSIS — D6869 Other thrombophilia: Secondary | ICD-10-CM

## 2019-09-15 DIAGNOSIS — Z7982 Long term (current) use of aspirin: Secondary | ICD-10-CM | POA: Insufficient documentation

## 2019-09-15 DIAGNOSIS — I2510 Atherosclerotic heart disease of native coronary artery without angina pectoris: Secondary | ICD-10-CM | POA: Insufficient documentation

## 2019-09-15 DIAGNOSIS — Z885 Allergy status to narcotic agent status: Secondary | ICD-10-CM | POA: Insufficient documentation

## 2019-09-15 DIAGNOSIS — Z79899 Other long term (current) drug therapy: Secondary | ICD-10-CM | POA: Insufficient documentation

## 2019-09-15 DIAGNOSIS — Z952 Presence of prosthetic heart valve: Secondary | ICD-10-CM | POA: Insufficient documentation

## 2019-09-15 DIAGNOSIS — Z7901 Long term (current) use of anticoagulants: Secondary | ICD-10-CM

## 2019-09-15 DIAGNOSIS — I4891 Unspecified atrial fibrillation: Secondary | ICD-10-CM

## 2019-09-15 DIAGNOSIS — Z801 Family history of malignant neoplasm of trachea, bronchus and lung: Secondary | ICD-10-CM | POA: Insufficient documentation

## 2019-09-15 LAB — POCT INR: INR: 3.7 — AB (ref 2.0–3.0)

## 2019-09-15 MED ORDER — DILTIAZEM HCL 60 MG PO TABS: ORAL_TABLET | ORAL | 4 refills | Status: DC

## 2019-09-15 NOTE — Patient Instructions (Signed)
Description   Continue on same dosage 10 mg daily except 5mg on Fridays. Recheck in 4 weeks. Coumadin Clinic [phone].     

## 2019-09-15 NOTE — Telephone Encounter (Signed)
Transmission received 09-14-2019
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
## 2019-09-15 NOTE — Telephone Encounter (Signed)
Transmission reviewed. AF episode noted 09/13/19 beginning at 20:26 (19:30 per patient--time stamp may be off due to Daylight Savings), 18.5hrs duration. V rates during AF controlled.  Spoke with pt. He saw Rudi Coco, NP, in AF Clinic today. Feels that he has a good plan in place right now with carvedilol, verapamil, and PRN dilitiazem. Pt agrees to let us know if he feels that his AF is more symptomatic in the future. Has f/u in 6 months in AF Clinic. Pt denies any additional questions or concerns at this time.

## 2019-09-28 ENCOUNTER — Ambulatory Visit (INDEPENDENT_AMBULATORY_CARE_PROVIDER_SITE_OTHER): Payer: Self-pay | Admitting: *Deleted

## 2019-09-28 DIAGNOSIS — R002 Palpitations: Secondary | ICD-10-CM

## 2019-09-28 LAB — CUP PACEART REMOTE DEVICE CHECK
Date Time Interrogation Session: 20210128024630
Implantable Pulse Generator Implant Date: 20180807

## 2019-09-28 NOTE — Progress Notes (Signed)
ILR Remote 

## 2019-09-30 ENCOUNTER — Other Ambulatory Visit (HOSPITAL_COMMUNITY): Payer: Self-pay | Admitting: Internal Medicine

## 2019-10-09 ENCOUNTER — Ambulatory Visit: Payer: Self-pay | Admitting: Thoracic Surgery (Cardiothoracic Vascular Surgery)

## 2019-10-09 ENCOUNTER — Other Ambulatory Visit: Payer: Self-pay

## 2019-10-13 ENCOUNTER — Other Ambulatory Visit: Payer: Self-pay

## 2019-10-13 ENCOUNTER — Ambulatory Visit (INDEPENDENT_AMBULATORY_CARE_PROVIDER_SITE_OTHER): Payer: Self-pay

## 2019-10-13 DIAGNOSIS — Z7901 Long term (current) use of anticoagulants: Secondary | ICD-10-CM

## 2019-10-13 DIAGNOSIS — I4891 Unspecified atrial fibrillation: Secondary | ICD-10-CM

## 2019-10-13 DIAGNOSIS — Z952 Presence of prosthetic heart valve: Secondary | ICD-10-CM

## 2019-10-13 LAB — POCT INR: INR: 3.5 — AB (ref 2.0–3.0)

## 2019-10-13 NOTE — Patient Instructions (Signed)
Description   Continue on same dosage 10 mg daily except 5mg on Fridays. Recheck in 4 weeks. Coumadin Clinic [phone].     

## 2019-10-25 ENCOUNTER — Ambulatory Visit (HOSPITAL_COMMUNITY): Payer: 59

## 2019-10-27 ENCOUNTER — Ambulatory Visit (HOSPITAL_COMMUNITY)
Admission: RE | Admit: 2019-10-27 | Discharge: 2019-10-27 | Disposition: A | Discharge status: Home / Self Care | Payer: 59 | Source: Ambulatory Visit | Attending: Internal Medicine | Admitting: Internal Medicine

## 2019-10-27 ENCOUNTER — Other Ambulatory Visit: Payer: Self-pay

## 2019-10-27 DIAGNOSIS — E785 Hyperlipidemia, unspecified: Secondary | ICD-10-CM | POA: Diagnosis not present

## 2019-10-27 DIAGNOSIS — I110 Hypertensive heart disease with heart failure: Secondary | ICD-10-CM | POA: Diagnosis not present

## 2019-10-27 DIAGNOSIS — I4891 Unspecified atrial fibrillation: Secondary | ICD-10-CM | POA: Insufficient documentation

## 2019-10-27 DIAGNOSIS — I5032 Chronic diastolic (congestive) heart failure: Secondary | ICD-10-CM | POA: Diagnosis present

## 2019-10-27 DIAGNOSIS — Z952 Presence of prosthetic heart valve: Secondary | ICD-10-CM | POA: Insufficient documentation

## 2019-10-27 DIAGNOSIS — G4730 Sleep apnea, unspecified: Secondary | ICD-10-CM | POA: Diagnosis not present

## 2019-10-27 NOTE — Progress Notes (Signed)
Echocardiogram 2D Echocardiogram has been performed.  Warren Lacy Enma Maeda 10/27/2019, 11:46 AM

## 2019-10-29 ENCOUNTER — Ambulatory Visit (INDEPENDENT_AMBULATORY_CARE_PROVIDER_SITE_OTHER): Payer: 59 | Admitting: *Deleted

## 2019-10-29 DIAGNOSIS — I480 Paroxysmal atrial fibrillation: Secondary | ICD-10-CM

## 2019-10-29 LAB — CUP PACEART REMOTE DEVICE CHECK
Date Time Interrogation Session: 20210228024625
Implantable Pulse Generator Implant Date: 20180807

## 2019-10-29 NOTE — Progress Notes (Signed)
ILR remote 

## 2019-11-07 ENCOUNTER — Encounter: Payer: Managed Care, Other (non HMO) | Admitting: Family Medicine

## 2019-11-09 ENCOUNTER — Encounter: Payer: 59 | Admitting: Family Medicine

## 2019-11-10 ENCOUNTER — Ambulatory Visit (INDEPENDENT_AMBULATORY_CARE_PROVIDER_SITE_OTHER): Payer: 59 | Admitting: Family Medicine

## 2019-11-10 ENCOUNTER — Encounter: Payer: Self-pay | Admitting: Family Medicine

## 2019-11-10 ENCOUNTER — Other Ambulatory Visit: Payer: Self-pay

## 2019-11-10 ENCOUNTER — Ambulatory Visit (INDEPENDENT_AMBULATORY_CARE_PROVIDER_SITE_OTHER): Payer: 59

## 2019-11-10 VITALS — BP 120/82 | HR 53 | Temp 97.9°F | Resp 16 | Ht 72.0 in | Wt 235.4 lb

## 2019-11-10 DIAGNOSIS — Z7901 Long term (current) use of anticoagulants: Secondary | ICD-10-CM | POA: Diagnosis not present

## 2019-11-10 DIAGNOSIS — I4891 Unspecified atrial fibrillation: Secondary | ICD-10-CM | POA: Diagnosis not present

## 2019-11-10 DIAGNOSIS — Z952 Presence of prosthetic heart valve: Secondary | ICD-10-CM | POA: Diagnosis not present

## 2019-11-10 DIAGNOSIS — Z0000 Encounter for general adult medical examination without abnormal findings: Secondary | ICD-10-CM

## 2019-11-10 DIAGNOSIS — Z125 Encounter for screening for malignant neoplasm of prostate: Secondary | ICD-10-CM

## 2019-11-10 DIAGNOSIS — I10 Essential (primary) hypertension: Secondary | ICD-10-CM | POA: Diagnosis not present

## 2019-11-10 DIAGNOSIS — E669 Obesity, unspecified: Secondary | ICD-10-CM | POA: Diagnosis not present

## 2019-11-10 DIAGNOSIS — Z1211 Encounter for screening for malignant neoplasm of colon: Secondary | ICD-10-CM

## 2019-11-10 LAB — POCT INR: INR: 4 — AB (ref 2.0–3.0)

## 2019-11-10 NOTE — Patient Instructions (Addendum)
Follow up in 6 months to recheck BP and cholesterol No need to repeat labs today- they looked great! Please complete and return the cologuard as directed Continue to work on healthy diet and regular exercise- you can do it! Call with any questions or concerns Stay Safe!  Stay Healthy!

## 2019-11-10 NOTE — Progress Notes (Signed)
   Subjective:    Patient ID: Mark [name], male    DOB: 14-Jan-1961, 59 y.o.   MRN: [id number]  HPI CPE- UTD on Tdap.  Plans to get COVID.  Due for colon cancer screen   Review of Systems Patient reports no vision/hearing changes, anorexia, fever ,adenopathy, persistant/recurrent hoarseness, swallowing issues, chest pain, palpitations, edema, persistant/recurrent cough, hemoptysis, dyspnea (rest,exertional, paroxysmal nocturnal), gastrointestinal  bleeding (melena, rectal bleeding), abdominal pain, excessive heart burn, GU symptoms (dysuria, hematuria, voiding/incontinence issues) syncope, focal weakness, memory loss, skin/hair/nail changes, depression, anxiety, abnormal bruising/bleeding, musculoskeletal symptoms/signs.   + numbness of R arm s/p aneurysm repair  This visit occurred during the SARS-CoV-2 public health emergency.  Safety protocols were in place, including screening questions prior to the visit, additional usage of staff PPE, and extensive cleaning of exam room while observing appropriate contact time as indicated for disinfecting solutions.       Objective:   Physical Exam General Appearance:    Alert, cooperative, no distress, appears stated age  Head:    Normocephalic, without obvious abnormality, atraumatic  Eyes:    PERRL, conjunctiva/corneas clear, EOM's intact, fundi    benign, both eyes       Ears:    Normal TM's and external ear canals, both ears  Nose:   Deferred due to COVID  Throat:   Neck:   Supple, symmetrical, trachea midline, no adenopathy;       thyroid:  No enlargement/tenderness/nodules  Back:     Symmetric, no curvature, ROM normal, no CVA tenderness  Lungs:     Clear to auscultation bilaterally, respirations unlabored  Chest wall:    No tenderness or deformity  Heart:    Regular rate and rhythm, mechanical click  Abdomen:     Soft, non-tender, bowel sounds active all four quadrants,    no masses, no organomegaly  Genitalia:    deferred   Rectal:    Extremities:   Extremities normal, atraumatic, 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 Normal strength, sensation and reflexes      throughout          Assessment & Plan:

## 2019-11-10 NOTE — Assessment & Plan Note (Signed)
Encouraged healthy diet and regular exercise.  Will follow.

## 2019-11-10 NOTE — Assessment & Plan Note (Signed)
Pt's PE unchanged from previous- known mechanical valve click- w/ exception of large pre-patellar mass (being addressed by ortho).  Due for colon cancer screen- cologuard ordered.  UTD on vaccines- has COVID appt scheduled.  Reviewed recent labs.  No need to repeat.

## 2019-11-10 NOTE — Assessment & Plan Note (Signed)
Chronic problem.  Well controlled today.  Asymptomatic.  Reviewed labs done in January- no need to repeat today.

## 2019-11-10 NOTE — Patient Instructions (Signed)
Description   Continue on same dosage 10 mg daily except 5mg  on Fridays. Recheck in 4 weeks. Coumadin Clinic [phone].

## 2019-11-17 ENCOUNTER — Other Ambulatory Visit: Payer: Self-pay | Admitting: Internal Medicine

## 2019-11-24 ENCOUNTER — Ambulatory Visit: Payer: Self-pay | Attending: Internal Medicine

## 2019-11-24 DIAGNOSIS — Z23 Encounter for immunization: Secondary | ICD-10-CM

## 2019-11-24 NOTE — Progress Notes (Signed)
   Covid-19 Vaccination Clinic  Name:  Mark [name]    MRN: [id number] DOB: 07-07-1961  11/24/2019  [name] was observed post Covid-19 immunization for 15 minutes without incident. He was provided with Vaccine Information Sheet and instruction to access the V-Safe system.   [name] was instructed to call 911 with any severe reactions post vaccine: Marland Kitchen Difficulty breathing  . Swelling of face and throat  . A fast heartbeat  . A bad rash all over body  . Dizziness and weakness   Immunizations Administered    Name Date Dose VIS Date Route   Pfizer COVID-19 Vaccine 11/24/2019 11:42 AM 0.3 mL 08/11/2019 Intramuscular   Manufacturer: [employer], [employer]   Lot: [id number]   NDC: [address]-7

## 2019-11-29 ENCOUNTER — Ambulatory Visit (INDEPENDENT_AMBULATORY_CARE_PROVIDER_SITE_OTHER): Payer: 59 | Admitting: *Deleted

## 2019-11-29 DIAGNOSIS — I480 Paroxysmal atrial fibrillation: Secondary | ICD-10-CM | POA: Diagnosis not present

## 2019-11-29 LAB — CUP PACEART REMOTE DEVICE CHECK
Date Time Interrogation Session: 20210331034917
Implantable Pulse Generator Implant Date: 20180807

## 2019-11-29 NOTE — Progress Notes (Signed)
ILR Remote 

## 2019-11-30 ENCOUNTER — Other Ambulatory Visit (HOSPITAL_COMMUNITY): Payer: Self-pay | Admitting: Internal Medicine

## 2019-12-08 ENCOUNTER — Other Ambulatory Visit: Payer: Self-pay

## 2019-12-08 ENCOUNTER — Ambulatory Visit (INDEPENDENT_AMBULATORY_CARE_PROVIDER_SITE_OTHER): Payer: 59 | Admitting: *Deleted

## 2019-12-08 DIAGNOSIS — Z7901 Long term (current) use of anticoagulants: Secondary | ICD-10-CM

## 2019-12-08 DIAGNOSIS — I4891 Unspecified atrial fibrillation: Secondary | ICD-10-CM

## 2019-12-08 DIAGNOSIS — Z952 Presence of prosthetic heart valve: Secondary | ICD-10-CM | POA: Diagnosis not present

## 2019-12-08 LAB — POCT INR: INR: 3 (ref 2.0–3.0)

## 2019-12-08 NOTE — Patient Instructions (Signed)
Description   Today take 10mg  then continue on same dosage 10 mg daily except 5mg  on Fridays. Recheck in 4 weeks. Coumadin Clinic [phone].

## 2019-12-09 ENCOUNTER — Other Ambulatory Visit: Payer: Self-pay | Admitting: Internal Medicine

## 2019-12-13 LAB — COLOGUARD: Cologuard: NEGATIVE

## 2019-12-15 ENCOUNTER — Other Ambulatory Visit: Payer: Self-pay

## 2019-12-15 ENCOUNTER — Emergency Department (HOSPITAL_COMMUNITY)
Admission: EM | Admit: 2019-12-15 | Discharge: 2019-12-16 | Discharge status: Against Medical Advice | Payer: 59 | Attending: Emergency Medicine | Admitting: Emergency Medicine

## 2019-12-15 ENCOUNTER — Encounter (HOSPITAL_COMMUNITY): Payer: Self-pay | Admitting: Emergency Medicine

## 2019-12-15 DIAGNOSIS — Z5321 Procedure and treatment not carried out due to patient leaving prior to being seen by health care provider: Secondary | ICD-10-CM | POA: Insufficient documentation

## 2019-12-15 DIAGNOSIS — R42 Dizziness and giddiness: Secondary | ICD-10-CM | POA: Insufficient documentation

## 2019-12-15 LAB — CBC WITH DIFFERENTIAL/PLATELET
Abs Immature Granulocytes: 0.06 10*3/uL (ref 0.00–0.07)
Basophils Absolute: 0.1 10*3/uL (ref 0.0–0.1)
Basophils Relative: 0 %
Eosinophils Absolute: 0.1 10*3/uL (ref 0.0–0.5)
Eosinophils Relative: 1 %
HCT: 41.8 % (ref 39.0–52.0)
Hemoglobin: 14 g/dL (ref 13.0–17.0)
Immature Granulocytes: 1 %
Lymphocytes Relative: 12 %
Lymphs Abs: 1.5 10*3/uL (ref 0.7–4.0)
MCH: 30.6 pg (ref 26.0–34.0)
MCHC: 33.5 g/dL (ref 30.0–36.0)
MCV: 91.3 fL (ref 80.0–100.0)
Monocytes Absolute: 0.6 10*3/uL (ref 0.1–1.0)
Monocytes Relative: 5 %
Neutro Abs: 10.5 10*3/uL — ABNORMAL HIGH (ref 1.7–7.7)
Neutrophils Relative %: 81 %
Platelets: 166 10*3/uL (ref 150–400)
RBC: 4.58 MIL/uL (ref 4.22–5.81)
RDW: 12.3 % (ref 11.5–15.5)
WBC: 12.8 10*3/uL — ABNORMAL HIGH (ref 4.0–10.5)
nRBC: 0 % (ref 0.0–0.2)

## 2019-12-15 LAB — COMPREHENSIVE METABOLIC PANEL
ALT: 25 U/L (ref 0–44)
AST: 22 U/L (ref 15–41)
Albumin: 4.2 g/dL (ref 3.5–5.0)
Alkaline Phosphatase: 51 U/L (ref 38–126)
Anion gap: 9 (ref 5–15)
BUN: 16 mg/dL (ref 6–20)
CO2: 25 mmol/L (ref 22–32)
Calcium: 9.3 mg/dL (ref 8.9–10.3)
Chloride: 104 mmol/L (ref 98–111)
Creatinine, Ser: 1.04 mg/dL (ref 0.61–1.24)
GFR calc Af Amer: >60 mL/min (ref >60)
GFR calc non Af Amer: >60 mL/min (ref >60)
Glucose, Bld: 149 mg/dL — ABNORMAL HIGH (ref 70–99)
Potassium: 4.2 mmol/L (ref 3.5–5.1)
Sodium: 138 mmol/L (ref 135–145)
Total Bilirubin: 1.4 mg/dL — ABNORMAL HIGH (ref 0.3–1.2)
Total Protein: 7 g/dL (ref 6.5–8.1)

## 2019-12-15 MED ORDER — ONDANSETRON 4 MG PO TBDP
4.0000 mg | ORAL_TABLET | Freq: Once | ORAL | Status: AC
  Administered 2019-12-15: 4 mg via ORAL
  Filled 2019-12-15: qty 1

## 2019-12-15 MED ORDER — ONDANSETRON HCL 4 MG/2ML IJ SOLN
4.0000 mg | Freq: Once | INTRAMUSCULAR | Status: DC
  Filled 2019-12-15: qty 2

## 2019-12-15 NOTE — ED Triage Notes (Signed)
Pt c/o dizziness and vomiting since 6:30 pm,. Pt denies any pain at this time. Pt is AO x 4 no neuro deficit noticed.

## 2019-12-16 NOTE — ED Notes (Signed)
Pt told registration he was leaving 

## 2019-12-18 ENCOUNTER — Ambulatory Visit: Payer: Self-pay | Attending: Internal Medicine

## 2019-12-18 DIAGNOSIS — Z23 Encounter for immunization: Secondary | ICD-10-CM

## 2019-12-18 NOTE — Progress Notes (Signed)
   Covid-19 Vaccination Clinic  Name:  Mark [name]    MRN: [id number] DOB: February 20, 1961  12/18/2019  Mark [name] was observed post Covid-19 immunization for 15 minutes without incident. He was provided with Vaccine Information Sheet and instruction to access the V-Safe system.   Mark [name] was instructed to call 911 with any severe reactions post vaccine: Marland Kitchen Difficulty breathing  . Swelling of face and throat  . A fast heartbeat  . A bad rash all over body  . Dizziness and weakness   Immunizations Administered    Name Date Dose VIS Date Route   Pfizer COVID-19 Vaccine 12/18/2019  4:01 PM 0.3 mL 10/25/2018 Intramuscular   Manufacturer: [employer], [employer]   Lot: [id number]   NDC: [address]-4

## 2019-12-20 LAB — COLOGUARD: COLOGUARD: NEGATIVE

## 2019-12-22 ENCOUNTER — Other Ambulatory Visit: Payer: Self-pay | Admitting: Family Medicine

## 2019-12-22 ENCOUNTER — Other Ambulatory Visit (HOSPITAL_COMMUNITY): Payer: Self-pay | Admitting: Internal Medicine

## 2019-12-30 LAB — CUP PACEART REMOTE DEVICE CHECK
Date Time Interrogation Session: 20210501035313
Implantable Pulse Generator Implant Date: 20180807

## 2020-01-01 ENCOUNTER — Ambulatory Visit (INDEPENDENT_AMBULATORY_CARE_PROVIDER_SITE_OTHER): Payer: 59 | Admitting: *Deleted

## 2020-01-01 DIAGNOSIS — I4891 Unspecified atrial fibrillation: Secondary | ICD-10-CM | POA: Diagnosis not present

## 2020-01-01 NOTE — Progress Notes (Signed)
Carelink Summary Report / Loop Recorder 

## 2020-01-05 ENCOUNTER — Other Ambulatory Visit: Payer: Self-pay

## 2020-01-05 ENCOUNTER — Ambulatory Visit (INDEPENDENT_AMBULATORY_CARE_PROVIDER_SITE_OTHER): Payer: 59 | Admitting: Pharmacist

## 2020-01-05 DIAGNOSIS — Z7901 Long term (current) use of anticoagulants: Secondary | ICD-10-CM

## 2020-01-05 DIAGNOSIS — I4891 Unspecified atrial fibrillation: Secondary | ICD-10-CM

## 2020-01-05 DIAGNOSIS — Z952 Presence of prosthetic heart valve: Secondary | ICD-10-CM | POA: Diagnosis not present

## 2020-01-05 LAB — POCT INR: INR: 4.5 — AB (ref 2.0–3.0)

## 2020-01-05 NOTE — Patient Instructions (Signed)
Continue same dosage 10 mg daily except 5mg  on Fridays. Recheck in 4 weeks. Coumadin Clinic [phone].

## 2020-01-15 ENCOUNTER — Telehealth: Payer: Self-pay

## 2020-01-15 NOTE — Telephone Encounter (Signed)
Spoke with pt advised transmission was receiving.  It appears he was in AF for 43 hours this weekend, presenting rhythm is SR.    Pt does receive care in AF clinic.  He will f/u with them regarding what he can do in future if this happens again.

## 2020-01-15 NOTE — Telephone Encounter (Signed)
Patient called and wants to know if someone can look at his transmission andd see if everything looks ok. He says he feels he was out of rhythm and he has been very tired and not feeling himself.

## 2020-02-02 LAB — CUP PACEART REMOTE DEVICE CHECK
Date Time Interrogation Session: 20210603230744
Implantable Pulse Generator Implant Date: 20180807

## 2020-02-08 ENCOUNTER — Other Ambulatory Visit: Payer: Self-pay | Admitting: Internal Medicine

## 2020-02-09 NOTE — Telephone Encounter (Signed)
Pt is overdue for an Anticoagulation appt as he did not show up for his last appt on 02/02/2020. Left pt a message to call back to schedule an appt.

## 2020-02-16 ENCOUNTER — Ambulatory Visit (INDEPENDENT_AMBULATORY_CARE_PROVIDER_SITE_OTHER): Payer: 59 | Admitting: *Deleted

## 2020-02-16 ENCOUNTER — Other Ambulatory Visit: Payer: Self-pay

## 2020-02-16 DIAGNOSIS — I4891 Unspecified atrial fibrillation: Secondary | ICD-10-CM | POA: Diagnosis not present

## 2020-02-16 DIAGNOSIS — Z952 Presence of prosthetic heart valve: Secondary | ICD-10-CM

## 2020-02-16 DIAGNOSIS — Z5181 Encounter for therapeutic drug level monitoring: Secondary | ICD-10-CM

## 2020-02-16 LAB — POCT INR: INR: 4.1 — AB (ref 2.0–3.0)

## 2020-02-16 NOTE — Patient Instructions (Addendum)
Description   °Continue same dosage 10 mg daily except 5mg on Fridays. Recheck in 4 weeks- pt request. Coumadin Clinic [phone]. °  ° °

## 2020-02-23 ENCOUNTER — Other Ambulatory Visit: Payer: Self-pay | Admitting: Internal Medicine

## 2020-03-07 ENCOUNTER — Ambulatory Visit (INDEPENDENT_AMBULATORY_CARE_PROVIDER_SITE_OTHER): Payer: 59 | Admitting: *Deleted

## 2020-03-07 DIAGNOSIS — I4891 Unspecified atrial fibrillation: Secondary | ICD-10-CM

## 2020-03-07 LAB — CUP PACEART REMOTE DEVICE CHECK
Date Time Interrogation Session: 20210707232618
Implantable Pulse Generator Implant Date: 20180807

## 2020-03-08 NOTE — Progress Notes (Signed)
Carelink Summary Report / Loop Recorder 

## 2020-03-14 ENCOUNTER — Other Ambulatory Visit (HOSPITAL_COMMUNITY): Payer: Self-pay | Admitting: Internal Medicine

## 2020-03-14 ENCOUNTER — Other Ambulatory Visit: Payer: Self-pay | Admitting: Family Medicine

## 2020-03-15 ENCOUNTER — Ambulatory Visit (HOSPITAL_COMMUNITY): Payer: Self-pay | Admitting: Nurse Practitioner

## 2020-03-22 ENCOUNTER — Other Ambulatory Visit: Payer: Self-pay

## 2020-03-22 ENCOUNTER — Ambulatory Visit (INDEPENDENT_AMBULATORY_CARE_PROVIDER_SITE_OTHER): Payer: 59

## 2020-03-22 ENCOUNTER — Ambulatory Visit (HOSPITAL_COMMUNITY)
Admission: RE | Admit: 2020-03-22 | Discharge: 2020-03-22 | Disposition: A | Discharge status: Home / Self Care | Payer: 59 | Source: Ambulatory Visit | Attending: Nurse Practitioner | Admitting: Nurse Practitioner

## 2020-03-22 ENCOUNTER — Encounter (HOSPITAL_COMMUNITY): Payer: Self-pay | Admitting: Nurse Practitioner

## 2020-03-22 VITALS — BP 108/70 | HR 52 | Ht 72.0 in | Wt 238.0 lb

## 2020-03-22 DIAGNOSIS — I714 Abdominal aortic aneurysm, without rupture: Secondary | ICD-10-CM | POA: Insufficient documentation

## 2020-03-22 DIAGNOSIS — I480 Paroxysmal atrial fibrillation: Secondary | ICD-10-CM | POA: Diagnosis present

## 2020-03-22 DIAGNOSIS — Z7901 Long term (current) use of anticoagulants: Secondary | ICD-10-CM | POA: Diagnosis not present

## 2020-03-22 DIAGNOSIS — Z7982 Long term (current) use of aspirin: Secondary | ICD-10-CM | POA: Diagnosis not present

## 2020-03-22 DIAGNOSIS — Z885 Allergy status to narcotic agent status: Secondary | ICD-10-CM | POA: Diagnosis not present

## 2020-03-22 DIAGNOSIS — I2510 Atherosclerotic heart disease of native coronary artery without angina pectoris: Secondary | ICD-10-CM | POA: Insufficient documentation

## 2020-03-22 DIAGNOSIS — G4733 Obstructive sleep apnea (adult) (pediatric): Secondary | ICD-10-CM | POA: Diagnosis not present

## 2020-03-22 DIAGNOSIS — Z79899 Other long term (current) drug therapy: Secondary | ICD-10-CM | POA: Diagnosis not present

## 2020-03-22 DIAGNOSIS — Z952 Presence of prosthetic heart valve: Secondary | ICD-10-CM | POA: Diagnosis not present

## 2020-03-22 DIAGNOSIS — I4891 Unspecified atrial fibrillation: Secondary | ICD-10-CM | POA: Diagnosis not present

## 2020-03-22 DIAGNOSIS — D6869 Other thrombophilia: Secondary | ICD-10-CM | POA: Diagnosis not present

## 2020-03-22 DIAGNOSIS — I10 Essential (primary) hypertension: Secondary | ICD-10-CM | POA: Insufficient documentation

## 2020-03-22 LAB — POCT INR: INR: 3.6 — AB (ref 2.0–3.0)

## 2020-03-22 NOTE — Patient Instructions (Signed)
Continue same dosage 10 mg daily except 5mg  on Fridays. Recheck in 4 weeks- pt request. Coumadin Clinic [phone].
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
## 2020-03-23 ENCOUNTER — Other Ambulatory Visit: Payer: Self-pay | Admitting: Internal Medicine

## 2020-04-07 ENCOUNTER — Other Ambulatory Visit: Payer: Self-pay | Admitting: Internal Medicine

## 2020-04-09 ENCOUNTER — Ambulatory Visit (INDEPENDENT_AMBULATORY_CARE_PROVIDER_SITE_OTHER): Payer: 59 | Admitting: *Deleted

## 2020-04-09 DIAGNOSIS — I480 Paroxysmal atrial fibrillation: Secondary | ICD-10-CM | POA: Diagnosis not present

## 2020-04-09 LAB — CUP PACEART REMOTE DEVICE CHECK
Date Time Interrogation Session: 20210809232636
Implantable Pulse Generator Implant Date: 20180807

## 2020-04-10 NOTE — Progress Notes (Signed)
Carelink Summary Report / Loop Recorder 

## 2020-04-16 ENCOUNTER — Telehealth: Payer: Self-pay

## 2020-04-16 NOTE — Telephone Encounter (Signed)
Received [facility] alert for presenting rhythm AF with varied rates, onset 04/15/20 approx 1420. OAC- Warfarin. Called patient to discuss d/d considering elevated VR.   No answer, LMOVM.

## 2020-04-17 ENCOUNTER — Other Ambulatory Visit: Payer: Self-pay | Admitting: Family Medicine

## 2020-04-20 ENCOUNTER — Other Ambulatory Visit: Payer: Self-pay | Admitting: Internal Medicine

## 2020-04-22 NOTE — Telephone Encounter (Signed)
Made pt an appt since he missed 04/19/20 appt. He has enough meds at this time. Will address refill at 8/26 appt.

## 2020-04-23 NOTE — Telephone Encounter (Signed)
Attempted to call patient. No answer, LMOVM.   According to lastest update on 04/23/20, VR is controlled.

## 2020-04-26 MED ORDER — CARVEDILOL 6.25 MG PO TABS: 3.1250 mg | ORAL_TABLET | Freq: Two times a day (BID) | ORAL | 2 refills | Status: DC

## 2020-04-26 NOTE — Telephone Encounter (Signed)
Patient returned phone call. States he has felt fatigued on and off the past few weeks. Patient was aware of AF w. RVR. States he took cardizem but didn't feel like he had much relief. Patient does report he is taking coreg 6.25 mg once a day although prescription says BID. States his dose was decreased last year d/t him reporting of feel fatigued. Patient advised not to increase to BID until he is directed to since he was symptomatic. Verbalizes understanding.   Advised patient I will forward this information to Rudi Coco, NP and someone will call him for an update. Patient agreeable.    Discussed with patient  He was only taking 6.25mg  at bedtime - he will try to start taking 1/2 tablet of coreg BID and let us know if this helps his symptoms.

## 2020-04-26 NOTE — Telephone Encounter (Signed)
Patient returned phone call. States he has felt fatigued on and off the past few weeks. Patient was aware of AF w. RVR. States he took cardizem but didn't feel like he had much relief. Patient does report he is taking coreg 6.25 mg once a day although prescription says BID. States his dose was decreased last year d/t him reporting of feel fatigued. Patient advised not to increase to BID until he is directed to since he was symptomatic. Verbalizes understanding.   Advised patient I will forward this information to Rudi Coco, NP and someone will call him for an update. Patient agreeable.

## 2020-04-30 NOTE — Telephone Encounter (Signed)
Called spoke with pt, overdue for follow-up, missed appt 8/20 and 8/26.  Pt scheduled appt for tomorrow 05/01/20 at 4pm/ewb

## 2020-05-01 ENCOUNTER — Telehealth: Payer: Self-pay | Admitting: *Deleted

## 2020-05-01 NOTE — Telephone Encounter (Signed)
Pt missed another appt, so left a message for the pt to call back to reschedule again.

## 2020-05-08 NOTE — Telephone Encounter (Signed)
Pt missed 9/1 appt and was called that day to r/s. Message was left for pt.  Called and left 2nd message for pt.

## 2020-05-08 NOTE — Telephone Encounter (Signed)
Pt missed 8/20, 8/26, and 9/1 INR appts.  2nd message left for pt to reschedule.

## 2020-05-12 ENCOUNTER — Ambulatory Visit: Payer: Self-pay

## 2020-05-13 LAB — CUP PACEART REMOTE DEVICE CHECK
Date Time Interrogation Session: 20210911233019
Implantable Pulse Generator Implant Date: 20180807

## 2020-05-14 NOTE — Telephone Encounter (Signed)
3rd message left for pt. 

## 2020-05-15 NOTE — Telephone Encounter (Signed)
Called pt again and left another message for the pt to callback to reschedule or to let us know who is following his INR.

## 2020-05-15 NOTE — Telephone Encounter (Signed)
Left a message for the pt to call back and reschedule appt or let us know who is following his INR. Advised the importance of monitoring INR.

## 2020-05-16 NOTE — Telephone Encounter (Signed)
Multiple phone calls to the pt to get his appt rescheduled and he rescheduled but them no showed for them and recently has not returned any recent calls. No answer from the pt today, so will deny request as pt has a message to call when he is available to schedule an appt.

## 2020-05-20 ENCOUNTER — Telehealth: Payer: Self-pay | Admitting: *Deleted

## 2020-05-20 NOTE — Telephone Encounter (Signed)
Pt s overdue for INR check, called both wife and pt and had to leave a message for the pt to call back regarding scheduling an appt or letting us know who is monitoring his INR. Left the direct number to call back, too.

## 2020-06-06 ENCOUNTER — Other Ambulatory Visit: Payer: Self-pay

## 2020-06-06 ENCOUNTER — Ambulatory Visit (INDEPENDENT_AMBULATORY_CARE_PROVIDER_SITE_OTHER): Payer: 59 | Admitting: *Deleted

## 2020-06-06 DIAGNOSIS — I4891 Unspecified atrial fibrillation: Secondary | ICD-10-CM | POA: Diagnosis not present

## 2020-06-06 DIAGNOSIS — Z952 Presence of prosthetic heart valve: Secondary | ICD-10-CM

## 2020-06-06 DIAGNOSIS — Z5181 Encounter for therapeutic drug level monitoring: Secondary | ICD-10-CM

## 2020-06-06 LAB — POCT INR: INR: 4 — AB (ref 2.0–3.0)

## 2020-06-06 MED ORDER — WARFARIN SODIUM 10 MG PO TABS: ORAL_TABLET | ORAL | 0 refills | Status: DC

## 2020-06-06 NOTE — Patient Instructions (Signed)
Description   Continue same dosage 10 mg daily except 5mg  on Fridays. Recheck in 4 weeks- pt request. Coumadin Clinic [phone].

## 2020-06-14 ENCOUNTER — Ambulatory Visit (INDEPENDENT_AMBULATORY_CARE_PROVIDER_SITE_OTHER): Payer: 59

## 2020-06-14 DIAGNOSIS — I4891 Unspecified atrial fibrillation: Secondary | ICD-10-CM

## 2020-06-15 LAB — CUP PACEART REMOTE DEVICE CHECK
Date Time Interrogation Session: 20211014233605
Implantable Pulse Generator Implant Date: 20180807

## 2020-06-19 NOTE — Progress Notes (Signed)
Carelink Summary Report / Loop Recorder 

## 2020-06-21 ENCOUNTER — Telehealth: Payer: Self-pay | Admitting: Family Medicine

## 2020-06-21 NOTE — Telephone Encounter (Signed)
Patient would like to have his blood drawn before seeing his cardiologist - there are no orders in the system - please advise - patient wold also like to get his flu shot at the same time.

## 2020-06-21 NOTE — Telephone Encounter (Signed)
Patient was last seen 11/10/19 for a cpe and does not have another visit scheduled. Please advise.

## 2020-06-24 NOTE — Telephone Encounter (Signed)
Pt is overdue for cholesterol follow up.  I am happy to draw blood and do flu shot when he comes for his appt.

## 2020-06-24 NOTE — Telephone Encounter (Signed)
Pt would need an appt.

## 2020-06-29 ENCOUNTER — Other Ambulatory Visit: Payer: Self-pay | Admitting: Internal Medicine

## 2020-07-05 ENCOUNTER — Ambulatory Visit (INDEPENDENT_AMBULATORY_CARE_PROVIDER_SITE_OTHER): Payer: 59 | Admitting: *Deleted

## 2020-07-05 ENCOUNTER — Other Ambulatory Visit: Payer: Self-pay

## 2020-07-05 ENCOUNTER — Ambulatory Visit: Payer: 59 | Admitting: Family Medicine

## 2020-07-05 DIAGNOSIS — Z5181 Encounter for therapeutic drug level monitoring: Secondary | ICD-10-CM

## 2020-07-05 DIAGNOSIS — Z952 Presence of prosthetic heart valve: Secondary | ICD-10-CM

## 2020-07-05 DIAGNOSIS — I4891 Unspecified atrial fibrillation: Secondary | ICD-10-CM

## 2020-07-05 LAB — POCT INR: INR: 2.7 (ref 2.0–3.0)

## 2020-07-05 NOTE — Patient Instructions (Signed)
Description    Take 10 mg today and 15 mg tomorrow, then continue same dosage 10 mg daily except 5mg  on Fridays. Recheck in 2 weeks. Coumadin Clinic [phone].

## 2020-07-07 ENCOUNTER — Other Ambulatory Visit: Payer: Self-pay | Admitting: Family Medicine

## 2020-07-12 ENCOUNTER — Encounter: Payer: Self-pay | Admitting: Orthopaedic Surgery

## 2020-07-12 ENCOUNTER — Ambulatory Visit (INDEPENDENT_AMBULATORY_CARE_PROVIDER_SITE_OTHER): Payer: 59 | Admitting: Orthopaedic Surgery

## 2020-07-12 ENCOUNTER — Other Ambulatory Visit: Payer: Self-pay

## 2020-07-12 ENCOUNTER — Encounter: Payer: Self-pay | Admitting: Family Medicine

## 2020-07-12 ENCOUNTER — Ambulatory Visit: Payer: Self-pay

## 2020-07-12 ENCOUNTER — Ambulatory Visit (INDEPENDENT_AMBULATORY_CARE_PROVIDER_SITE_OTHER): Payer: 59 | Admitting: Family Medicine

## 2020-07-12 VITALS — BP 116/70 | HR 54 | Temp 97.3°F | Resp 15 | Ht 72.0 in | Wt 243.0 lb

## 2020-07-12 DIAGNOSIS — G8929 Other chronic pain: Secondary | ICD-10-CM

## 2020-07-12 DIAGNOSIS — E785 Hyperlipidemia, unspecified: Secondary | ICD-10-CM | POA: Diagnosis not present

## 2020-07-12 DIAGNOSIS — Z23 Encounter for immunization: Secondary | ICD-10-CM

## 2020-07-12 DIAGNOSIS — M25562 Pain in left knee: Secondary | ICD-10-CM

## 2020-07-12 DIAGNOSIS — R2242 Localized swelling, mass and lump, left lower limb: Secondary | ICD-10-CM | POA: Diagnosis not present

## 2020-07-12 DIAGNOSIS — E669 Obesity, unspecified: Secondary | ICD-10-CM | POA: Diagnosis not present
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
## 2020-07-12 NOTE — Progress Notes (Signed)
   Subjective:    Patient ID: Mark [name], male    DOB: 12-21-1960, 59 y.o.   MRN: [id number]  HPI Hyperlipidemia- chronic problem, on Simvastatin 20mg  daily.  Denies abd pain, N/V.  Pt has gained 8 lbs since last visit.  No CP, SOB above baseline.  Obesity- pt has gained 8 lbs since last visit.  No regular exercise- has large mass on anterior L knee that interferes w/ functionality.  Not following a particular diet.   Review of Systems For ROS see HPI   This visit occurred during the SARS-CoV-2 public health emergency.  Safety protocols were in place, including screening questions prior to the visit, additional usage of staff PPE, and extensive cleaning of exam room while observing appropriate contact time as indicated for disinfecting solutions.       Objective:   Physical Exam Vitals reviewed.  Constitutional:      General: He is not in acute distress.    Appearance: He is well-developed. He is obese. He is not ill-appearing.  HENT:     Head: Normocephalic and atraumatic.  Eyes:     Conjunctiva/sclera: Conjunctivae normal.     Pupils: Pupils are equal, round, and reactive to light.  Neck:     Thyroid: No thyromegaly.  Cardiovascular:     Rate and Rhythm: Normal rate and regular rhythm.     Heart sounds: Normal heart sounds. No murmur heard.   Pulmonary:     Effort: Pulmonary effort is normal. No respiratory distress.     Breath sounds: Normal breath sounds.  Abdominal:     General: Bowel sounds are normal. There is no distension.     Palpations: Abdomen is soft.  Musculoskeletal:        General: Deformity (large soft tissue mass on anterior L knee) present.     Cervical back: Normal range of motion and neck supple.  Lymphadenopathy:     Cervical: No cervical adenopathy.  Skin:    General: Skin is warm and dry.  Neurological:     Mental Status: He is alert and oriented to person, place, and time.     Cranial Nerves: No cranial nerve deficit.  Psychiatric:         Behavior: Behavior normal.           Assessment & Plan:

## 2020-07-12 NOTE — Assessment & Plan Note (Signed)
Pt is up 8 lbs since last visit.  Again discussed the need for healthy diet and regular exercise.  Check labs to risk stratify.  Will follow.

## 2020-07-12 NOTE — Assessment & Plan Note (Signed)
Chronic problem.  Tolerating statin w/o difficulty.  Discussed the importance of healthy diet and regular exercise.  Check labs.  Adjust meds prn

## 2020-07-12 NOTE — Patient Instructions (Addendum)
Schedule your complete physical in 6 months Go to [address] to get your labs done or schedule a lab appt at your convenience Continue to work on healthy food choices- you can do it!!! Call with any questions or concerns Stay Safe!  Stay Healthy! Happy Holidays!!!

## 2020-07-17 ENCOUNTER — Ambulatory Visit (INDEPENDENT_AMBULATORY_CARE_PROVIDER_SITE_OTHER): Payer: 59

## 2020-07-17 DIAGNOSIS — I4891 Unspecified atrial fibrillation: Secondary | ICD-10-CM

## 2020-07-17 LAB — CUP PACEART REMOTE DEVICE CHECK
Date Time Interrogation Session: 20211116225126
Implantable Pulse Generator Implant Date: 20180807

## 2020-07-18 NOTE — Progress Notes (Signed)
Carelink Summary Report / Loop Recorder 

## 2020-07-19 ENCOUNTER — Other Ambulatory Visit: Payer: Self-pay

## 2020-07-19 ENCOUNTER — Ambulatory Visit (INDEPENDENT_AMBULATORY_CARE_PROVIDER_SITE_OTHER): Payer: 59

## 2020-07-19 DIAGNOSIS — I4891 Unspecified atrial fibrillation: Secondary | ICD-10-CM

## 2020-07-19 DIAGNOSIS — Z952 Presence of prosthetic heart valve: Secondary | ICD-10-CM

## 2020-07-19 DIAGNOSIS — Z7901 Long term (current) use of anticoagulants: Secondary | ICD-10-CM

## 2020-07-19 LAB — POCT INR: INR: 4 — AB (ref 2.0–3.0)

## 2020-07-19 NOTE — Patient Instructions (Signed)
Description   Continue same dosage 10 mg daily except 5mg  on Fridays. Recheck in 4 weeks. [facility]madin Clinic [phone].

## 2020-08-01 ENCOUNTER — Telehealth (HOSPITAL_COMMUNITY): Payer: Self-pay | Admitting: *Deleted

## 2020-08-01 NOTE — Telephone Encounter (Signed)
Surgical clearance faxed to [phone].  Pt cleared for left knee mass incision per Dr.Bensimhon coumadin bridge to be managed by coumadin clinic.

## 2020-08-19 ENCOUNTER — Ambulatory Visit (INDEPENDENT_AMBULATORY_CARE_PROVIDER_SITE_OTHER): Payer: Self-pay

## 2020-08-19 DIAGNOSIS — I4891 Unspecified atrial fibrillation: Secondary | ICD-10-CM

## 2020-08-19 LAB — CUP PACEART REMOTE DEVICE CHECK
Date Time Interrogation Session: 20211219225122
Implantable Pulse Generator Implant Date: 20180807

## 2020-08-20 ENCOUNTER — Encounter (HOSPITAL_COMMUNITY): Payer: 59 | Admitting: Internal Medicine

## 2020-08-20 ENCOUNTER — Telehealth: Payer: Self-pay | Admitting: *Deleted

## 2020-08-20 NOTE — Telephone Encounter (Signed)
Called pt since he missed today's Anticoagulation Appt; LM to call back to reschedule.

## 2020-08-27 ENCOUNTER — Telehealth (INDEPENDENT_AMBULATORY_CARE_PROVIDER_SITE_OTHER): Payer: Self-pay | Admitting: Physician Assistant

## 2020-08-27 ENCOUNTER — Other Ambulatory Visit: Payer: Self-pay

## 2020-08-27 ENCOUNTER — Telehealth: Payer: Self-pay | Admitting: Orthopaedic Surgery

## 2020-08-27 ENCOUNTER — Encounter: Payer: Self-pay | Admitting: Physician Assistant

## 2020-08-27 VITALS — BP 128/78 | HR 58

## 2020-08-27 DIAGNOSIS — R058 Other specified cough: Secondary | ICD-10-CM

## 2020-08-27 MED ORDER — BENZONATATE 100 MG PO CAPS: 100.0000 mg | ORAL_CAPSULE | Freq: Three times a day (TID) | ORAL | 0 refills | Status: DC | PRN

## 2020-08-27 MED ORDER — ALBUTEROL SULFATE HFA 108 (90 BASE) MCG/ACT IN AERS: 2.0000 | INHALATION_SPRAY | Freq: Four times a day (QID) | RESPIRATORY_TRACT | 0 refills | Status: DC | PRN

## 2020-08-27 NOTE — Progress Notes (Signed)
I have discussed the procedure for the virtual visit with the patient who has given consent to proceed with assessment and treatment.   Mark [name] S Mark [name], CMA     

## 2020-08-27 NOTE — Telephone Encounter (Signed)
thanks

## 2020-08-27 NOTE — Progress Notes (Signed)
Virtual Visit via Video   I connected with patient on 08/27/20 at  9:30 AM EST by a video enabled telemedicine application and verified that I am speaking with the correct person using two identifiers.  Location patient: Home Location provider: [place] [facility], Office Persons participating in the virtual visit: Patient, Provider, CMA (Patina Moore)  I discussed the limitations of evaluation and management by telemedicine and the availability of in person appointments. The patient expressed understanding and agreed to proceed.  Subjective:   HPI:   Patient presents via [facility] today c/o 1 week of dry cough associated with headache and nasal congestion. Notes other symptoms have resolved but cough lingers, sometimes with mild wheezing. Denies recent travel or sick contact. Had potential exposure to COVID about 3 weeks ago. Was tested negative again this past Saturday. Notes he feels great other than the lingering cough. Has been using Coridicin HBP to help with this.  ROS:   See pertinent positives and negatives per HPI.  Patient Active Problem List   Diagnosis Date Noted  . Chronic diastolic heart failure (HCC) 04/28/2019  . Mass of left knee 04/06/2019  . PAD (peripheral artery disease) (HCC) 10/27/2018  . Thoracic aortic aneurysm without rupture (HCC) 10/03/2018  . 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 Obesity (BMI 30-39.9) 09/28/2018  . Physical exam 02/01/2017  . PVC's (premature ventricular contractions) 02/21/2014  . Essential hypertension, benign 11/15/2013  . Closed L1 vertebral fracture (HCC) 05/29/2013  . Abnormal surgical wound 02/07/2013  . Chronic cholecystitis with calculus 01/03/2013  . Labyrinthitis 09/13/2012  . Sleep apnea, obstructive 03/16/2012  . Seasonal allergic rhinitis 12/25/2011  . Aneurysm of ascending aorta (HCC) 09/28/2011  . Palpitations 09/18/2011  . Long term (current) use of anticoagulants 12/11/2010  .  Hyperlipidemia 12/11/2010  . Aortic stenosis   . 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 Atrial fibrillation (HCC) 05/21/2010  . ANKLE SPRAIN, LEFT 04/28/2010  . HEADACHE 04/18/2010  . 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 HEMOPTYSIS 11/21/2008    Social History   Tobacco Use  . Smoking status: Never Smoker  . Smokeless tobacco: Never Used  Substance Use Topics  . Alcohol use: No    Current Outpatient Medications:  .  aspirin EC 81 MG tablet, Take 81 mg by mouth at bedtime. , Disp: , Rfl:  .  carvedilol (COREG) 6.25 MG tablet, Take 0.5 tablets (3.125 mg total) by mouth 2 (two) times daily., Disp: 180 tablet, Rfl: 2 .  diltiazem (CARDIZEM) 60 MG tablet, Take one tablet by mouth every 6 hours as needed for afib, Disp: 30 tablet, Rfl: 4 .  esomeprazole (NEXIUM) 20 MG capsule, Take 20 mg by mouth at bedtime., Disp: , Rfl:  .  ibuprofen (ADVIL,MOTRIN) 200 MG tablet, Take 400-600 mg by mouth every 8 (eight) hours as needed (pain.)., Disp: , Rfl:  .  lisinopril (ZESTRIL) 40 MG tablet, TAKE 1 TABLET BY MOUTH EVERY DAY, Disp: 90 tablet, Rfl: 3 .  meclizine (ANTIVERT) 25 MG tablet, Take 1 tablet (25 mg total) by mouth 3 (three) times daily as needed for dizziness., Disp: 30 tablet, Rfl: 0 .  simvastatin (ZOCOR) 20 MG tablet, TAKE 1 TABLET BY MOUTH EVERYDAY AT BEDTIME, Disp: 90 tablet, Rfl: 0 .  spironolactone (ALDACTONE) 25 MG tablet, Take 1 tablet (25 mg total) by mouth daily., Disp: 90 tablet, Rfl: 3 .  verapamil (VERELAN PM) 120 MG 24 hr capsule, Take 1 capsule (120 mg total) by mouth at bedtime., Disp:  90 capsule, Rfl: 3 .  warfarin (COUMADIN) 10 MG tablet, TAKE AS DIRECTED BY COUMADIN CLINIC, Disp: 35 tablet, Rfl: 1  Allergies  Allergen Reactions  . Codeine Nausea And Vomiting  . Morphine And Related Nausea And Vomiting  . Percocet [Oxycodone-Acetaminophen] Nausea And Vomiting    Objective:   BP 128/78   Pulse (!) 58   Patient is well-developed, well-nourished in no acute  distress.  Resting comfortably at home.  Head is normocephalic, atraumatic.  No labored breathing.  Speech is clear and coherent with logical content.  Patient is alert and oriented at baseline.   Assessment and Plan:   1. Post-viral cough syndrome Negative Covid test.  No classic Covid symptoms.  Symptoms resolving except for lingering cough which is secondary residual inflammation and bronchospasm due to recent URI.  Increase fluids.  Rest.  Continue Coricidin HBP.  We will add on Tessalon.  Rx albuterol for as needed use.  Strict return precautions discussed with patient who voiced understanding and agreement with plan. - benzonatate (TESSALON) 100 MG capsule; Take 1 capsule (100 mg total) by mouth 3 (three) times daily as needed for cough.  Dispense: 30 capsule; Refill: 0 - albuterol (VENTOLIN HFA) 108 (90 Base) MCG/ACT inhaler; Inhale 2 puffs into the lungs every 6 (six) hours as needed for wheezing or shortness of breath.  Dispense: 8 g; Refill: 0    Piedad Climes, PA-C 08/27/2020

## 2020-08-27 NOTE — Telephone Encounter (Signed)
FYI

## 2020-08-27 NOTE — Telephone Encounter (Signed)
I called patient to discuss surgery date for removal of left knee mass.  Cardiac clearance has been obtained.  Patient goes to the coumadin clinic and has an appointment this coming Thursday Dec 30th for them to advise on the Lovenox bridge.  Patient states he not ready to schedule surgery because his wife has suffered a stroke two weeks ago and he is taking care of her.  Patient has my card and will call when he is able to schedule.

## 2020-08-29 ENCOUNTER — Other Ambulatory Visit: Payer: Self-pay

## 2020-08-29 ENCOUNTER — Ambulatory Visit (INDEPENDENT_AMBULATORY_CARE_PROVIDER_SITE_OTHER): Payer: Self-pay | Admitting: *Deleted

## 2020-08-29 DIAGNOSIS — Z952 Presence of prosthetic heart valve: Secondary | ICD-10-CM

## 2020-08-29 DIAGNOSIS — I4891 Unspecified atrial fibrillation: Secondary | ICD-10-CM

## 2020-08-29 DIAGNOSIS — Z5181 Encounter for therapeutic drug level monitoring: Secondary | ICD-10-CM

## 2020-08-29 LAB — POCT INR: INR: 1.8 — AB (ref 2.0–3.0)

## 2020-08-29 NOTE — Patient Instructions (Addendum)
Description   Take 1.5 tablets today and tomorrow, then continue same dosage 10 mg daily except 5mg  on Fridays. Recheck in 2 weeks. Coumadin Clinic [phone].

## 2020-08-29 NOTE — Progress Notes (Signed)
Carelink Summary Report / Loop Recorder 

## 2020-09-02 ENCOUNTER — Other Ambulatory Visit: Payer: Self-pay

## 2020-09-02 ENCOUNTER — Telehealth: Payer: Self-pay

## 2020-09-02 DIAGNOSIS — R058 Other specified cough: Secondary | ICD-10-CM

## 2020-09-02 MED ORDER — BENZONATATE 100 MG PO CAPS: 100.0000 mg | ORAL_CAPSULE | Freq: Three times a day (TID) | ORAL | 0 refills | Status: DC | PRN

## 2020-09-02 NOTE — Telephone Encounter (Signed)
..  Medication Refills  Medication:  Tessalon capsules  Pharmacy:  [facility]  ** Let patient know to contact pharmacy at the end of the day to make sure medication is ready.**  ** Please notify patient to allow 48-72 hours to process.**  ** Encourage patient to contact the pharmacy for refills or they can request refills through [facility]**  Clinical Fills out below:   Last refill:  QTY:  Refill Date:    Other Comments:  Pt states he was seen on 12/28.  States he is almost out and would like one more script.    Okay for refill?  Please advise.

## 2020-09-02 NOTE — Telephone Encounter (Signed)
Patient seen on 08/27/20. Please advise.

## 2020-09-02 NOTE — Telephone Encounter (Signed)
Left a vm message informing the patient that his prescription was sent to his pharmacy.

## 2020-09-02 NOTE — Telephone Encounter (Signed)
Okay to send in refill

## 2020-09-06 ENCOUNTER — Encounter: Payer: Self-pay | Admitting: Internal Medicine

## 2020-09-06 ENCOUNTER — Other Ambulatory Visit: Payer: Self-pay

## 2020-09-06 ENCOUNTER — Ambulatory Visit (HOSPITAL_BASED_OUTPATIENT_CLINIC_OR_DEPARTMENT_OTHER)
Admission: RE | Admit: 2020-09-06 | Discharge: 2020-09-06 | Disposition: A | Discharge status: Home / Self Care | Payer: 59 | Source: Ambulatory Visit | Attending: Internal Medicine | Admitting: Internal Medicine

## 2020-09-06 ENCOUNTER — Telehealth (INDEPENDENT_AMBULATORY_CARE_PROVIDER_SITE_OTHER): Payer: 59 | Admitting: Internal Medicine

## 2020-09-06 VITALS — BP 128/78 | HR 72 | Ht 72.0 in

## 2020-09-06 DIAGNOSIS — R059 Cough, unspecified: Secondary | ICD-10-CM

## 2020-09-06 MED ORDER — CEFPROZIL 500 MG PO TABS: 500.0000 mg | ORAL_TABLET | Freq: Two times a day (BID) | ORAL | 0 refills | Status: DC

## 2020-09-06 MED ORDER — BUDESONIDE-FORMOTEROL FUMARATE 80-4.5 MCG/ACT IN AERO: 2.0000 | INHALATION_SPRAY | Freq: Two times a day (BID) | RESPIRATORY_TRACT | 0 refills | Status: DC
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
## 2020-09-08 NOTE — Progress Notes (Signed)
Patient ID: Mark [name], male   DOB: Mar 13, 1961, 61 y.o.   MRN: [id number]    Advanced Heart Failure Clinic Note     Patient did not show for appointment. Note left for templating purposes only     PCP: [name] HF: Dr. Gala Romney   HPI:  Mark [name] is a t 60 year old male with a history of bicuspid aortic valve complicated by endocarditis.  He is status post St. Jude mechanical aortic valve replacement in 1983.  He also has a history of hyperlipidemia and PAF s/p DC-CV in 9/11. OSA noncompliant with CPAP.   Over the past few years year, he has had some progression in the gradients across his valve.  He underwent cardiac catheterization in May 2009 which showed normal coronary arteries.The valve leaflets were seen to be opening well on fluoroscopy.  He did undergo a TEE.  While there was some turbulence around the valve, the leaflets seemed to be moving well.  There was no obvious pannus formation.  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 He was seen by Dr. [name] who agreed  with continue watchul waiting.   Last saw Dr. [name] in 4/18 CT chest showed [name] aneurysmal dilation of aortic root at 5.0 cm.  Has struggled with AF/palpitations.  Has seen Dr. Johney Frame and had two ablations in 8/13 and 3/14. Now has Linq monitor in (placed 04/06/17). Seen in AF Clinic in 7/21 AF burden was low.   Most recent device interrogation from 12/21 reviewed AF burden 13.9% with longest event 49 hr duration (overall AF burden over life of ILR ~5%)  He returns for regular follow up. Overall doing ok. Says AF is kicking his but has episodes several times per week. Was walking 3 miles per day but then stopped.  Working [employer]. No CP or undue SOB. No edema. No bleeding with warfarin .  Echo 6/19: EF 55-60% AoV Mean gradient (S): 27 mm Hg Personally reviewed  Echo 7/18  EF 55-60% AoV Mean gradient (S): 26 mm Hg. Peak  gradient (S): 51 mm Hg. AVA 1.35 cm2 Echo 12/14/2014 LVEF 55-60%, Aortic valve gradient 44 to 60 mm Hg, increased from 22 to 35 mm Hg. Echo 4/17 EF 60-65%  AoV Mean gradient (S): 34 mm Hg. Peak gradient (S): 63 mm Hg.  Review of systems complete and found to be negative unless listed in HPI.   Past Medical History:  Diagnosis Date  . Aortic stenosis    a. due to bicuspid AoV; 1983 S/p mechanical AVR (St. Jude) - chronic coumadin with INR run between 3.5-4.5;  b. 03/2012 Echo: EF 60%, nl wall motion, mod dil LA.  Marland Kitchen Ascending aortic aneurysm (HCC)   . Atrial tachycardia (HCC)    a. admitted 07/2012  . Coronary artery disease   . ED (erectile dysfunction)   . Gallstones   . H/O cardiac catheterization    a. 12/2007 Cath: nl cors.  . Headache(784.0)   . 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 Hypertension   . OSA (obstructive sleep apnea)    a. noncompliant with CPAP  . Paroxysmal atrial fibrillation (HCC)    s/p afib ablation x 2    Current Outpatient Medications  Medication Sig Dispense Refill  . albuterol (VENTOLIN HFA) 108 (90 Base) MCG/ACT inhaler Inhale 2 puffs into the lungs every 6 (six) hours as needed for wheezing or  shortness of breath. 8 g 0  . aspirin EC 81 MG tablet Take 81 mg by mouth at bedtime.     . benzonatate (TESSALON) 100 MG capsule Take 1 capsule (100 mg total) by mouth 3 (three) times daily as needed for cough. 30 capsule 0  . budesonide-formoterol (SYMBICORT) 80-4.5 MCG/ACT inhaler Inhale 2 puffs into the lungs 2 (two) times daily. 1 each 0  . carvedilol (COREG) 6.25 MG tablet Take 0.5 tablets (3.125 mg total) by mouth 2 (two) times daily. 180 tablet 2  . cefPROZIL (CEFZIL) 500 MG tablet Take 1 tablet (500 mg total) by mouth 2 (two) times daily. 14 tablet 0  . diltiazem (CARDIZEM) 60 MG tablet Take one tablet by mouth every 6 hours as needed for afib (Patient not taking: Reported on 09/06/2020) 30 tablet 4  . esomeprazole (NEXIUM) 20 MG capsule Take 20 mg  by mouth at bedtime.    Marland Kitchen lisinopril (ZESTRIL) 40 MG tablet TAKE 1 TABLET BY MOUTH EVERY DAY 90 tablet 3  . meclizine (ANTIVERT) 25 MG tablet Take 1 tablet (25 mg total) by mouth 3 (three) times daily as needed for dizziness. 30 tablet 0  . simvastatin (ZOCOR) 20 MG tablet TAKE 1 TABLET BY MOUTH EVERYDAY AT BEDTIME 90 tablet 0  . spironolactone (ALDACTONE) 25 MG tablet Take 1 tablet (25 mg total) by mouth daily. 90 tablet 3  . verapamil (VERELAN PM) 120 MG 24 hr capsule Take 1 capsule (120 mg total) by mouth at bedtime. 90 capsule 3  . warfarin (COUMADIN) 10 MG tablet TAKE AS DIRECTED BY COUMADIN CLINIC 35 tablet 1   No current facility-administered medications for this encounter.    PHYSICAL EXAM: There were no vitals filed for this visit. Wt Readings from Last 3 Encounters:  07/12/20 110.2 kg (243 lb)  03/22/20 (!) 108 kg (238 lb)  12/15/19 106.6 kg (235 lb)    General:  Well appearing. No resp difficulty HEENT: normal Neck: supple. no JVD. Carotids 2+ bilat; + bruits. No lymphadenopathy or thryomegaly appreciated. Cor: PMI nondisplaced. Regular rate & rhythm. Crisp mechanical s2. 2/6 AS Lungs: clear Abdomen: soft, nontender, nondistended. No hepatosplenomegaly. No bruits or masses. Good bowel sounds. Extremities: no cyanosis, clubbing, rash, edema Neuro: alert & orientedx3, cranial nerves grossly intact. moves all 4 extremities w/o difficulty. Affect pleasant   ASSESSMENT & PLAN: 1. Aortic stenosis s/p AVR - Echo 03/02/17 EF normal Mean AoV gradient 26.  - Valve sounds good on exam. - Echo 6/19 AVR [name] mean gradient 27 - Emphasized use abx for SBE prophylaxis - Continue coumadin and ECASA 81. No bleeding - Will repeat echo in February  2. HTN - Blood pressure well controlled. Continue current regimen. - OSA - wearing CPAP about 2 hours nightly 3. Aortic root aneurysm - Followed by Dr. [name]. Repeat scan 4/19 - HR [name]. BP well controlled. 4. Lipids - Followed by Dr.  Beverely Low.  5. Atrial fibrillation  - Followed by Dr. Johney Frame. Has LINQ in place  - Has ILR with afib burden 4.8%.  - not wearing CPAP. Looking into dental appliance  Arvilla Meres, MD 8:25 PM

## 2020-09-09 ENCOUNTER — Inpatient Hospital Stay (HOSPITAL_COMMUNITY)
Admission: RE | Admit: 2020-09-09 | Discharge: 2020-09-09 | Disposition: A | Discharge status: Home / Self Care | Payer: Self-pay | Source: Ambulatory Visit | Attending: Internal Medicine | Admitting: Internal Medicine

## 2020-09-09 DIAGNOSIS — Z952 Presence of prosthetic heart valve: Secondary | ICD-10-CM

## 2020-09-10 ENCOUNTER — Telehealth: Payer: Self-pay | Admitting: Physician Assistant

## 2020-09-13 ENCOUNTER — Other Ambulatory Visit: Payer: Self-pay

## 2020-09-13 ENCOUNTER — Ambulatory Visit (INDEPENDENT_AMBULATORY_CARE_PROVIDER_SITE_OTHER): Payer: 59

## 2020-09-13 DIAGNOSIS — Z952 Presence of prosthetic heart valve: Secondary | ICD-10-CM

## 2020-09-13 DIAGNOSIS — Z7901 Long term (current) use of anticoagulants: Secondary | ICD-10-CM | POA: Diagnosis not present

## 2020-09-13 DIAGNOSIS — I4891 Unspecified atrial fibrillation: Secondary | ICD-10-CM | POA: Diagnosis not present

## 2020-09-13 LAB — POCT INR: INR: 6.3 — AB (ref 2.0–3.0)

## 2020-09-13 NOTE — Patient Instructions (Signed)
-  Skip warfarin tonight and tomorrow,  - on Sunday, resume your dosage of warfarin 10 mg daily except 5mg  on Fridays.  -Recheck in 2 weeks.  Coumadin Clinic [phone].

## 2020-09-18 ENCOUNTER — Other Ambulatory Visit: Payer: Self-pay | Admitting: Internal Medicine

## 2020-09-19 ENCOUNTER — Ambulatory Visit (INDEPENDENT_AMBULATORY_CARE_PROVIDER_SITE_OTHER): Payer: 59

## 2020-09-19 DIAGNOSIS — I4891 Unspecified atrial fibrillation: Secondary | ICD-10-CM | POA: Diagnosis not present

## 2020-09-21 LAB — CUP PACEART REMOTE DEVICE CHECK
Date Time Interrogation Session: 20220121230433
Implantable Pulse Generator Implant Date: 20180807

## 2020-09-27 ENCOUNTER — Other Ambulatory Visit: Payer: 59

## 2020-09-27 ENCOUNTER — Ambulatory Visit (HOSPITAL_COMMUNITY): Payer: 59 | Admitting: Nurse Practitioner

## 2020-09-27 DIAGNOSIS — Z20822 Contact with and (suspected) exposure to covid-19: Secondary | ICD-10-CM

## 2020-09-29 LAB — NOVEL CORONAVIRUS, NAA: SARS-CoV-2, NAA: DETECTED — AB

## 2020-09-29 LAB — SARS-COV-2, NAA 2 DAY TAT

## 2020-09-30 ENCOUNTER — Telehealth: Payer: Self-pay | Admitting: *Deleted

## 2020-09-30 NOTE — Telephone Encounter (Signed)
Called to discuss with patient about COVID-19 symptoms and the use of one of the available treatments for those with mild to moderate Covid symptoms and at a high risk of hospitalization.  Pt appears to qualify for outpatient treatment due to co-morbid conditions and/or a member of an at-risk group in accordance with the FDA Emergency Use Authorization.    Symptom onset:   NO symptoms  Vaccinated:  Booster? Qualifiers:   Patient reported feeling great with no symptoms.  Mark [name]

## 2020-10-01 ENCOUNTER — Other Ambulatory Visit: Payer: Self-pay | Admitting: Internal Medicine

## 2020-10-01 NOTE — Progress Notes (Signed)
Carelink Summary Report / Loop Recorder 

## 2020-10-10 ENCOUNTER — Ambulatory Visit (INDEPENDENT_AMBULATORY_CARE_PROVIDER_SITE_OTHER): Payer: 59

## 2020-10-10 ENCOUNTER — Other Ambulatory Visit: Payer: Self-pay

## 2020-10-10 ENCOUNTER — Other Ambulatory Visit: Payer: Self-pay | Admitting: Family Medicine

## 2020-10-10 ENCOUNTER — Other Ambulatory Visit: Payer: Self-pay | Admitting: Thoracic Surgery (Cardiothoracic Vascular Surgery)

## 2020-10-10 ENCOUNTER — Encounter (HOSPITAL_COMMUNITY): Payer: Self-pay | Admitting: Internal Medicine

## 2020-10-10 ENCOUNTER — Ambulatory Visit (HOSPITAL_COMMUNITY)
Admission: RE | Admit: 2020-10-10 | Discharge: 2020-10-10 | Disposition: A | Discharge status: Home / Self Care | Payer: 59 | Source: Ambulatory Visit | Attending: Internal Medicine | Admitting: Internal Medicine

## 2020-10-10 VITALS — BP 130/78 | HR 60 | Wt 241.8 lb

## 2020-10-10 DIAGNOSIS — I712 Thoracic aortic aneurysm, without rupture, unspecified: Secondary | ICD-10-CM

## 2020-10-10 DIAGNOSIS — Q2543 Congenital aneurysm of aorta: Secondary | ICD-10-CM | POA: Insufficient documentation

## 2020-10-10 DIAGNOSIS — Z8616 Personal history of COVID-19: Secondary | ICD-10-CM | POA: Insufficient documentation

## 2020-10-10 DIAGNOSIS — I4891 Unspecified atrial fibrillation: Secondary | ICD-10-CM

## 2020-10-10 DIAGNOSIS — Z7982 Long term (current) use of aspirin: Secondary | ICD-10-CM | POA: Insufficient documentation

## 2020-10-10 DIAGNOSIS — Z952 Presence of prosthetic heart valve: Secondary | ICD-10-CM

## 2020-10-10 DIAGNOSIS — I5032 Chronic diastolic (congestive) heart failure: Secondary | ICD-10-CM

## 2020-10-10 DIAGNOSIS — G4733 Obstructive sleep apnea (adult) (pediatric): Secondary | ICD-10-CM | POA: Insufficient documentation

## 2020-10-10 DIAGNOSIS — I10 Essential (primary) hypertension: Secondary | ICD-10-CM | POA: Diagnosis not present

## 2020-10-10 DIAGNOSIS — I719 Aortic aneurysm of unspecified site, without rupture: Secondary | ICD-10-CM | POA: Diagnosis not present

## 2020-10-10 DIAGNOSIS — E785 Hyperlipidemia, unspecified: Secondary | ICD-10-CM | POA: Insufficient documentation

## 2020-10-10 DIAGNOSIS — I509 Heart failure, unspecified: Secondary | ICD-10-CM | POA: Diagnosis not present

## 2020-10-10 DIAGNOSIS — I480 Paroxysmal atrial fibrillation: Secondary | ICD-10-CM | POA: Diagnosis not present

## 2020-10-10 DIAGNOSIS — Z7951 Long term (current) use of inhaled steroids: Secondary | ICD-10-CM | POA: Diagnosis not present

## 2020-10-10 DIAGNOSIS — I350 Nonrheumatic aortic (valve) stenosis: Secondary | ICD-10-CM | POA: Diagnosis present

## 2020-10-10 DIAGNOSIS — I110 Hypertensive heart disease with heart failure: Secondary | ICD-10-CM | POA: Insufficient documentation

## 2020-10-10 DIAGNOSIS — Z7901 Long term (current) use of anticoagulants: Secondary | ICD-10-CM | POA: Insufficient documentation

## 2020-10-10 DIAGNOSIS — I7121 Aneurysm of the ascending aorta, without rupture: Secondary | ICD-10-CM

## 2020-10-10 DIAGNOSIS — Z9119 Patient's noncompliance with other medical treatment and regimen: Secondary | ICD-10-CM | POA: Insufficient documentation

## 2020-10-10 DIAGNOSIS — Z79899 Other long term (current) drug therapy: Secondary | ICD-10-CM | POA: Insufficient documentation

## 2020-10-10 HISTORY — DX: Heart failure, unspecified: I50.9

## 2020-10-10 HISTORY — DX: COVID-19: U07.1

## 2020-10-10 LAB — POCT INR: INR: 3.8 — AB (ref 2.0–3.0)

## 2020-10-10 NOTE — Addendum Note (Signed)
Encounter addended by: Samara Snide, RN on: 10/10/2020 10:34 AM  Actions taken: Visit diagnoses modified, Order list changed, Diagnosis association updated, Clinical Note Signed

## 2020-10-10 NOTE — Patient Instructions (Signed)
Your physician has requested that you have an echocardiogram. Echocardiography is a painless test that uses sound waves to create images of your heart. It provides your doctor with information about the size and shape of your heart and how well your heart's chambers and valves are working. This procedure takes approximately one hour. There are no restrictions for this procedure.   Please call our office in February 2022 to schedule your follow up appointment and repeat echo  If you have any questions or concerns before your next appointment please send [place] a message through [place] or call our office at [phone].    TO LEAVE A MESSAGE FOR THE NURSE SELECT OPTION 2, PLEASE LEAVE A MESSAGE INCLUDING: . YOUR NAME . DATE OF BIRTH . CALL BACK NUMBER . REASON FOR CALL**this is important as we prioritize the call backs  YOU WILL RECEIVE A CALL BACK THE SAME DAY AS LONG AS YOU CALL BEFORE 4:00 PM

## 2020-10-10 NOTE — Patient Instructions (Signed)
Description   Continue on same dosage of warfarin 10 mg daily except 5mg  on Fridays. Recheck in 4 weeks. Coumadin Clinic [phone].

## 2020-10-10 NOTE — Progress Notes (Addendum)
Patient ID: Mark [name], male   DOB: 1961-06-07, 60 y.o.   MRN: [id number]    Advanced Heart Failure Clinic Note    PCP: Mark [name] HF: Mark [name]   HPI:  Mark [name] is a 60 year old male with a history of bicuspid aortic valve complicated by endocarditis.  He is status post St. Jude mechanical aortic valve replacement in 1983.  He also has a history of hyperlipidemia and PAF s/p DC-CV in 9/11. OSA noncompliant with CPAP.   Over the past few years year, he has had some progression in the gradients across his valve.  He underwent cardiac catheterization in May 2009 which showed normal coronary arteries.The valve leaflets were seen to be opening well on fluoroscopy.  He did undergo a TEE.  While there was some turbulence around the valve, the leaflets seemed to be moving well.  There was no obvious pannus formation.  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 He was seen by Dr. [name] who agreed  with continue watchul waiting.   Last saw Dr. 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  Has struggled with AF/palpitations.  Has seen Dr. [name] and had two ablations in 8/13 and 3/14. Now has Linq monitor in (placed 04/06/17). Seen in AF Clinic in 7/21 AF burden was [name].   Most recent device interrogation from 12/21 reviewed AF burden 13.9% with longest event 49 hr duration (overall AF burden over life of ILR ~5%). AF worse during COVID   He returns for regular follow up. Had COVID 3 weeks ago (was vaccinated). Still a bit SOB. No edema, CP, orthopnea or PND. Occasioanl dizziness due to vertigo. Remains on warfarin. No bleeding.   Echo 2/21: EF 60-65% Aortic valve mean gradient measures 29.0 mmHg. Aortic valve peak gradient  measures 57.8 mmHg. AVA 1.2 cm2 Personally reviewed  Echo 6/19: EF 55-60% AoV Mean gradient (S): 27 mm Hg  Echo 7/18  EF 55-60% AoV Mean  gradient (S): 26 mm Hg. Peak gradient (S): 51 mm Hg. AVA 1.35 cm2 Echo 12/14/2014 LVEF 55-60%, Aortic valve gradient 44 to 60 mm Hg, increased from 22 to 35 mm Hg. Echo 4/17 EF 60-65%  AoV Mean gradient (S): 34 mm Hg. Peak gradient (S): 63 mm Hg.  Review of systems complete and found to be negative unless listed in HPI.   Past Medical History:  Diagnosis Date  . Aortic stenosis    a. due to bicuspid AoV; 1983 S/p mechanical AVR (St. Jude) - chronic coumadin with INR run between 3.5-4.5;  b. 03/2012 Echo: EF 60%, nl wall motion, mod dil LA.  Marland Kitchen Ascending aortic aneurysm (HCC)   . Atrial tachycardia (HCC)    a. admitted 07/2012  . CHF (congestive heart failure) (HCC)   . Coronary artery disease   . COVID janurary 2022  . ED (erectile dysfunction)   . Gallstones   . H/O cardiac catheterization    a. 12/2007 Cath: nl cors.  . Headache(784.0)   . 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 Hypertension   . OSA (obstructive sleep apnea)    a. noncompliant with CPAP  . Paroxysmal atrial fibrillation (HCC)    s/p afib ablation x 2    Current Outpatient Medications  Medication Sig Dispense Refill  . albuterol (VENTOLIN HFA) 108 (90 Base) MCG/ACT inhaler Inhale 2 puffs  into the lungs every 6 (six) hours as needed for wheezing or shortness of breath. 8 g 0  . aspirin EC 81 MG tablet Take 81 mg by mouth at bedtime.     . carvedilol (COREG) 6.25 MG tablet Take 0.5 tablets (3.125 mg total) by mouth 2 (two) times daily. 180 tablet 2  . diltiazem (CARDIZEM) 60 MG tablet Take one tablet by mouth every 6 hours as needed for afib 30 tablet 4  . esomeprazole (NEXIUM) 20 MG capsule Take 20 mg by mouth at bedtime.    Marland Kitchen lisinopril (ZESTRIL) 40 MG tablet TAKE 1 TABLET BY MOUTH EVERY DAY 90 tablet 3  . meclizine (ANTIVERT) 25 MG tablet Take 1 tablet (25 mg total) by mouth 3 (three) times daily as needed for dizziness. 30 tablet 0  . simvastatin (ZOCOR) 20 MG tablet TAKE 1 TABLET BY MOUTH EVERYDAY AT  BEDTIME 90 tablet 0  . spironolactone (ALDACTONE) 25 MG tablet Take 1 tablet (25 mg total) by mouth daily. 90 tablet 3  . SYMBICORT 80-4.5 MCG/ACT inhaler INHALE 2 PUFFS INTO THE LUNGS TWICE A DAY 10.2 each 0  . verapamil (VERELAN PM) 120 MG 24 hr capsule Take 1 capsule (120 mg total) by mouth at bedtime. 90 capsule 3  . warfarin (COUMADIN) 10 MG tablet TAKE AS DIRECTED BY COUMADIN CLINIC 35 tablet 1   No current facility-administered medications for this encounter.    PHYSICAL EXAM: Vitals:   10/10/20 0939  BP: 130/78  Pulse: 60  SpO2: 100%  Weight: 109.7 kg (241 lb 12.8 oz)   Wt Readings from Last 3 Encounters:  10/10/20 109.7 kg (241 lb 12.8 oz)  07/12/20 110.2 kg (243 lb)  03/22/20 (!) 108 kg (238 lb)    General:  Well appearing. No resp difficulty HEENT: normal Neck: supple. no JVD. Carotids 2+ bilat; no bruits. No lymphadenopathy or thryomegaly appreciated. Cor: PMI nondisplaced. Regular rate & rhythm. 2/6 AS s2 crisp Lungs: clear Abdomen: soft, nontender, nondistended. No hepatosplenomegaly. No bruits or masses. Good bowel sounds. Extremities: no cyanosis, clubbing, rash, edema Neuro: alert & orientedx3, cranial nerves grossly intact. moves all 4 extremities w/o difficulty. Affect pleasant   ASSESSMENT & PLAN: 1. Aortic stenosis s/p mechanical AVR - Echo 03/02/17 EF normal Mean AoV gradient 26.  - Valve sounds crisp on exam - Echo 6/19 AVR [name] mean gradient 27 - Echo 2/21: EF 60-65% Aortic valve mean gradient measures 29.0 mmHg. Aortic valve peak gradient  measures 57.8 mmHg. AVA 1.2 cm2 Personally reviewed - Emphasized use abx for SBE prophylaxis - Continue coumadin and ECASA 81. No bleeding - Due for repeat echo 2. HTN - Blood pressure well controlled. Continue current regimen. 3. Aortic root aneurysm - Followed by Dr. [name]. Repeat scan 2/20 which measures 5.0 x 4.9 cm, previously 4.8 x 4.6 cm measured similarly on examinations previously - Will see Dr. [name]  later this month with repeat CT - HR [name]. BP well controlled. 4. Lipids - Followed by Mark [name].  5. Atrial fibrillation  - Followed by Dr. [name]. Has LINQ in place  - not wearing CPAP - continue coumadin   Mark Meres, MD 10:20 AM

## 2020-10-21 ENCOUNTER — Ambulatory Visit (INDEPENDENT_AMBULATORY_CARE_PROVIDER_SITE_OTHER): Payer: 59

## 2020-10-21 ENCOUNTER — Other Ambulatory Visit: Payer: Self-pay | Admitting: Internal Medicine

## 2020-10-21 DIAGNOSIS — I4891 Unspecified atrial fibrillation: Secondary | ICD-10-CM

## 2020-10-22 ENCOUNTER — Ambulatory Visit (HOSPITAL_COMMUNITY): Payer: 59

## 2020-10-24 LAB — CUP PACEART REMOTE DEVICE CHECK
Date Time Interrogation Session: 20220223230601
Implantable Pulse Generator Implant Date: 20180807

## 2020-10-24 NOTE — Progress Notes (Signed)
Carelink Summary Report / Loop Recorder 

## 2020-10-28 ENCOUNTER — Ambulatory Visit: Payer: 59 | Admitting: Thoracic Surgery (Cardiothoracic Vascular Surgery)

## 2020-10-30 ENCOUNTER — Encounter (HOSPITAL_COMMUNITY): Payer: Self-pay

## 2020-10-30 ENCOUNTER — Ambulatory Visit (HOSPITAL_COMMUNITY)
Admission: RE | Admit: 2020-10-30 | Discharge: 2020-10-30 | Disposition: A | Discharge status: Home / Self Care | Payer: 59 | Source: Ambulatory Visit | Attending: Thoracic Surgery (Cardiothoracic Vascular Surgery) | Admitting: Thoracic Surgery (Cardiothoracic Vascular Surgery)

## 2020-10-30 ENCOUNTER — Other Ambulatory Visit: Payer: Self-pay

## 2020-10-30 DIAGNOSIS — I712 Thoracic aortic aneurysm, without rupture, unspecified: Secondary | ICD-10-CM

## 2020-10-30 MED ORDER — IOHEXOL 350 MG/ML SOLN
100.0000 mL | Freq: Once | INTRAVENOUS | Status: AC | PRN
  Administered 2020-10-30: 100 mL via INTRAVENOUS

## 2020-11-01 ENCOUNTER — Ambulatory Visit (HOSPITAL_COMMUNITY): Admission: RE | Admit: 2020-11-01 | Payer: 59 | Source: Ambulatory Visit

## 2020-11-04 ENCOUNTER — Encounter (HOSPITAL_COMMUNITY): Payer: Self-pay

## 2020-11-04 ENCOUNTER — Ambulatory Visit (HOSPITAL_COMMUNITY): Payer: 59

## 2020-11-08 ENCOUNTER — Ambulatory Visit (HOSPITAL_COMMUNITY): Payer: 59

## 2020-11-11 ENCOUNTER — Ambulatory Visit: Payer: 59 | Admitting: Thoracic Surgery (Cardiothoracic Vascular Surgery)

## 2020-11-12 ENCOUNTER — Ambulatory Visit (HOSPITAL_COMMUNITY): Payer: 59

## 2020-11-16 ENCOUNTER — Other Ambulatory Visit: Payer: Self-pay | Admitting: Internal Medicine

## 2020-11-16 DIAGNOSIS — I4891 Unspecified atrial fibrillation: Secondary | ICD-10-CM

## 2020-11-16 DIAGNOSIS — Z952 Presence of prosthetic heart valve: Secondary | ICD-10-CM

## 2020-11-18 ENCOUNTER — Ambulatory Visit: Payer: 59 | Admitting: Thoracic Surgery (Cardiothoracic Vascular Surgery)

## 2020-11-21 ENCOUNTER — Ambulatory Visit (INDEPENDENT_AMBULATORY_CARE_PROVIDER_SITE_OTHER): Payer: 59

## 2020-11-21 ENCOUNTER — Other Ambulatory Visit (HOSPITAL_COMMUNITY): Payer: Self-pay | Admitting: Internal Medicine

## 2020-11-21 DIAGNOSIS — I4891 Unspecified atrial fibrillation: Secondary | ICD-10-CM

## 2020-11-26 LAB — CUP PACEART REMOTE DEVICE CHECK
Date Time Interrogation Session: 20220329001809
Implantable Pulse Generator Implant Date: 20180807

## 2020-11-27 ENCOUNTER — Telehealth: Payer: Self-pay

## 2020-11-27 NOTE — Telephone Encounter (Signed)
ILR reached RRT 11/26/20. Attempted to contact patient to discuss explant/implant. No answer, LMOVM.  Patient marked "I" in [facility].  Taken out of [employer]. In Epic canceled.

## 2020-12-02 NOTE — Progress Notes (Signed)
Carelink Summary Report / Loop Recorder 

## 2020-12-08 ENCOUNTER — Other Ambulatory Visit: Payer: Self-pay

## 2020-12-08 ENCOUNTER — Encounter (HOSPITAL_COMMUNITY): Payer: Self-pay | Admitting: Emergency Medicine

## 2020-12-08 ENCOUNTER — Emergency Department (HOSPITAL_COMMUNITY): Payer: 59

## 2020-12-08 ENCOUNTER — Emergency Department (HOSPITAL_COMMUNITY)
Admission: EM | Admit: 2020-12-08 | Discharge: 2020-12-08 | Disposition: A | Discharge status: Home / Self Care | Payer: 59 | Attending: Emergency Medicine | Admitting: Emergency Medicine

## 2020-12-08 DIAGNOSIS — Z8616 Personal history of COVID-19: Secondary | ICD-10-CM | POA: Insufficient documentation

## 2020-12-08 DIAGNOSIS — R0789 Other chest pain: Secondary | ICD-10-CM | POA: Diagnosis not present

## 2020-12-08 DIAGNOSIS — M79602 Pain in left arm: Secondary | ICD-10-CM | POA: Diagnosis not present

## 2020-12-08 DIAGNOSIS — Z79899 Other long term (current) drug therapy: Secondary | ICD-10-CM | POA: Insufficient documentation

## 2020-12-08 DIAGNOSIS — Z7901 Long term (current) use of anticoagulants: Secondary | ICD-10-CM | POA: Diagnosis not present

## 2020-12-08 DIAGNOSIS — I5032 Chronic diastolic (congestive) heart failure: Secondary | ICD-10-CM | POA: Insufficient documentation

## 2020-12-08 DIAGNOSIS — I110 Hypertensive heart disease with heart failure: Secondary | ICD-10-CM | POA: Diagnosis not present

## 2020-12-08 DIAGNOSIS — Z7982 Long term (current) use of aspirin: Secondary | ICD-10-CM | POA: Insufficient documentation

## 2020-12-08 DIAGNOSIS — R079 Chest pain, unspecified: Secondary | ICD-10-CM | POA: Diagnosis present

## 2020-12-08 DIAGNOSIS — I2510 Atherosclerotic heart disease of native coronary artery without angina pectoris: Secondary | ICD-10-CM | POA: Diagnosis not present

## 2020-12-08 LAB — PROTIME-INR
INR: 1.8 — ABNORMAL HIGH (ref 0.8–1.2)
Prothrombin Time: 20.5 seconds — ABNORMAL HIGH (ref 11.4–15.2)

## 2020-12-08 LAB — BASIC METABOLIC PANEL
Anion gap: 8 (ref 5–15)
BUN: 13 mg/dL (ref 6–20)
CO2: 24 mmol/L (ref 22–32)
Calcium: 9.5 mg/dL (ref 8.9–10.3)
Chloride: 106 mmol/L (ref 98–111)
Creatinine, Ser: 1.06 mg/dL (ref 0.61–1.24)
GFR, Estimated: >60 mL/min (ref >60)
Glucose, Bld: 99 mg/dL (ref 70–99)
Potassium: 3.8 mmol/L (ref 3.5–5.1)
Sodium: 138 mmol/L (ref 135–145)

## 2020-12-08 LAB — CBC
HCT: 41.9 % (ref 39.0–52.0)
Hemoglobin: 14 g/dL (ref 13.0–17.0)
MCH: 30.7 pg (ref 26.0–34.0)
MCHC: 33.4 g/dL (ref 30.0–36.0)
MCV: 91.9 fL (ref 80.0–100.0)
Platelets: 182 10*3/uL (ref 150–400)
RBC: 4.56 MIL/uL (ref 4.22–5.81)
RDW: 12.1 % (ref 11.5–15.5)
WBC: 8 10*3/uL (ref 4.0–10.5)
nRBC: 0 % (ref 0.0–0.2)

## 2020-12-08 LAB — TROPONIN I (HIGH SENSITIVITY)
Troponin I (High Sensitivity): 9 ng/L (ref <18)
Troponin I (High Sensitivity): 9 ng/L (ref <18)

## 2020-12-08 MED ORDER — IOHEXOL 300 MG/ML  SOLN
100.0000 mL | Freq: Once | INTRAMUSCULAR | Status: AC | PRN
  Administered 2020-12-08: 100 mL via INTRAVENOUS

## 2020-12-08 NOTE — ED Notes (Signed)
IV Team at bedside 
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
## 2020-12-08 NOTE — Discharge Instructions (Addendum)
Please follow closely with your cardiologist regarding your visit today. Treat your symptoms with over-the-counter medications as needed. Call first thing in the morning to discuss your subtherapeutic INR level of 1.8 Return if symptoms worsen in any way.

## 2020-12-08 NOTE — ED Notes (Signed)
Patient transported to CT 

## 2020-12-08 NOTE — ED Triage Notes (Signed)
Pt reports intermittent L sided chest pain that started while sitting down resting this morning.  Reports mild dizziness.  Denies sob, nausea, and vomiting.  Denies pain at present.

## 2020-12-10 ENCOUNTER — Ambulatory Visit (INDEPENDENT_AMBULATORY_CARE_PROVIDER_SITE_OTHER): Payer: 59 | Admitting: Pharmacist

## 2020-12-10 DIAGNOSIS — Z7901 Long term (current) use of anticoagulants: Secondary | ICD-10-CM | POA: Diagnosis not present

## 2020-12-10 DIAGNOSIS — Z952 Presence of prosthetic heart valve: Secondary | ICD-10-CM | POA: Diagnosis not present

## 2020-12-10 DIAGNOSIS — I4811 Longstanding persistent atrial fibrillation: Secondary | ICD-10-CM | POA: Diagnosis not present

## 2020-12-10 NOTE — Patient Instructions (Signed)
Description   Instructed patient to take 2 tablets today (20mg ) and tomorrow then continue current regimen.  Recheck in 1 week. Coumadin Clinic [phone].

## 2020-12-10 NOTE — Progress Notes (Signed)
INR checked in ER on 12/08/20.  1.8

## 2020-12-13 NOTE — Telephone Encounter (Signed)
Left detailed message on patient voicemail about possible explant. Left instructions to contact the Device clinic at [phone].Waiting on a return call.

## 2020-12-20 NOTE — Telephone Encounter (Signed)
Patient called and discussed options to explant or leave in. Patient states he would like to make an apt. In office to speak to Dr. Rayann Heman about another ILR. Patient expressed it is his security for knowing if he goes into AF and he would feel best if he had another one implanted. Advised patient I will forward to scheduling.    Patients address on file. Email sent to [employer] for return kit. [place] [phone]

## 2021-01-06 ENCOUNTER — Other Ambulatory Visit: Payer: Self-pay

## 2021-01-06 ENCOUNTER — Ambulatory Visit: Payer: 59 | Admitting: Thoracic Surgery (Cardiothoracic Vascular Surgery)

## 2021-01-06 ENCOUNTER — Encounter: Payer: Self-pay | Admitting: Thoracic Surgery (Cardiothoracic Vascular Surgery)

## 2021-01-06 ENCOUNTER — Telehealth (INDEPENDENT_AMBULATORY_CARE_PROVIDER_SITE_OTHER): Payer: 59 | Admitting: Thoracic Surgery (Cardiothoracic Vascular Surgery)

## 2021-01-06 DIAGNOSIS — I712 Thoracic aortic aneurysm, without rupture: Secondary | ICD-10-CM | POA: Diagnosis not present

## 2021-01-06 DIAGNOSIS — I7121 Aneurysm of the ascending aorta, without rupture: Secondary | ICD-10-CM
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
## 2021-01-07 ENCOUNTER — Other Ambulatory Visit: Payer: Self-pay | Admitting: Family Medicine

## 2021-01-07 MED ORDER — DILTIAZEM HCL 60 MG PO TABS: ORAL_TABLET | ORAL | 1 refills | Status: DC

## 2021-01-13 ENCOUNTER — Other Ambulatory Visit (HOSPITAL_COMMUNITY): Payer: Self-pay | Admitting: Internal Medicine

## 2021-01-16 ENCOUNTER — Other Ambulatory Visit (HOSPITAL_COMMUNITY): Payer: Self-pay | Admitting: *Deleted

## 2021-01-16 MED ORDER — VERAPAMIL HCL ER 120 MG PO CP24
120.0000 mg | ORAL_CAPSULE | Freq: Every day | ORAL | 3 refills | Status: DC
Start: 2021-01-16 — End: 2021-04-15

## 2021-01-22 ENCOUNTER — Encounter: Payer: 59 | Admitting: Internal Medicine

## 2021-01-22 DIAGNOSIS — I480 Paroxysmal atrial fibrillation: Secondary | ICD-10-CM

## 2021-01-22 DIAGNOSIS — Z952 Presence of prosthetic heart valve: Secondary | ICD-10-CM

## 2021-01-24 ENCOUNTER — Ambulatory Visit (INDEPENDENT_AMBULATORY_CARE_PROVIDER_SITE_OTHER): Payer: 59 | Admitting: *Deleted

## 2021-01-24 ENCOUNTER — Other Ambulatory Visit: Payer: Self-pay

## 2021-01-24 DIAGNOSIS — Z952 Presence of prosthetic heart valve: Secondary | ICD-10-CM | POA: Diagnosis not present

## 2021-01-24 DIAGNOSIS — Z7901 Long term (current) use of anticoagulants: Secondary | ICD-10-CM | POA: Diagnosis not present

## 2021-01-24 DIAGNOSIS — I4891 Unspecified atrial fibrillation: Secondary | ICD-10-CM

## 2021-01-24 LAB — POCT INR: INR: 4.5 — AB (ref 2.0–3.0)

## 2021-01-24 NOTE — Patient Instructions (Signed)
Description   Continue taking 10mg  daily except 5mg  on Fridays. Recheck in 5 weeks with Dr Appt. Coumadin [facility] [phone].

## 2021-02-11 ENCOUNTER — Encounter: Payer: Self-pay | Admitting: Orthopaedic Surgery

## 2021-02-11 ENCOUNTER — Ambulatory Visit: Payer: Self-pay

## 2021-02-11 ENCOUNTER — Ambulatory Visit (INDEPENDENT_AMBULATORY_CARE_PROVIDER_SITE_OTHER): Payer: 59 | Admitting: Orthopaedic Surgery

## 2021-02-11 VITALS — BP 123/84 | HR 64 | Ht 72.0 in | Wt 241.0 lb

## 2021-02-11 DIAGNOSIS — M79645 Pain in left finger(s): Secondary | ICD-10-CM

## 2021-02-11 DIAGNOSIS — S6292XA Unspecified fracture of left wrist and hand, initial encounter for closed fracture: Secondary | ICD-10-CM | POA: Diagnosis not present
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
## 2021-02-12 ENCOUNTER — Telehealth: Payer: Self-pay | Admitting: Radiology

## 2021-02-12 NOTE — Telephone Encounter (Signed)
Patient called to check on Urgent referral that was entered for Dr. Merlyn Lot yesterday afternoon in regards to his thumb fracture. Per Martie Lee, Dr. Merlyn Lot does not accept his insurance and patient will have to be self pay to be seen there. OK per Dr. Ophelia Charter to refer to Dr. Amanda Pea or Dr. Melvyn Novas.  Sabrina to change referral.    I called patient and advised of above. He will wait for call from [facility] to schedule appointment.  He will check back with me tomorrow afternoon if he has not heard anything.

## 2021-02-21 ENCOUNTER — Telehealth: Payer: Self-pay | Admitting: Orthopaedic Surgery

## 2021-02-21 NOTE — Telephone Encounter (Signed)
Patient called advised he was referred over to [facility] and his insurance was out of network. Patient asked if he can be seen by Dr Magnus Ivan? Patient said he need a hand surgeon who is in network with his insurance. The number  to contact patient is [phone]

## 2021-02-21 NOTE — Telephone Encounter (Signed)
Please advise 

## 2021-02-22 ENCOUNTER — Other Ambulatory Visit: Payer: Self-pay | Admitting: Internal Medicine

## 2021-02-22 DIAGNOSIS — Z952 Presence of prosthetic heart valve: Secondary | ICD-10-CM

## 2021-02-22 DIAGNOSIS — I4891 Unspecified atrial fibrillation: Secondary | ICD-10-CM

## 2021-02-24 NOTE — Telephone Encounter (Signed)
Warfarin 30 day supply sent with 1 refill. No 90 day supply sent due to noncompliance with appts. Pt is pending appt with Dr. Sanjuan [facility] on 02/26/2021.

## 2021-02-24 NOTE — Telephone Encounter (Signed)
Can you see who pt is in network with please

## 2021-02-25 ENCOUNTER — Other Ambulatory Visit: Payer: Self-pay

## 2021-02-25 ENCOUNTER — Telehealth: Payer: Self-pay | Admitting: Family Medicine

## 2021-02-25 DIAGNOSIS — S62502A Fracture of unspecified phalanx of left thumb, initial encounter for closed fracture: Secondary | ICD-10-CM

## 2021-02-25 NOTE — Telephone Encounter (Signed)
Dr. Roda Shutters- Dr. Ophelia Charter saw patient for thumb fracture and initially referred to Dr. Merrilee Seashore office. They do not accept patient's insurance. We then referred to Dr. Amanda Pea, who we have discovered does not accept his insurance either. Could you review patient's xrays and determine whether you would be able to offer anything for him? His PCP has referred back to [place] for the fracture.   Thank you.

## 2021-02-25 NOTE — Telephone Encounter (Signed)
Referral placed.

## 2021-02-25 NOTE — Telephone Encounter (Signed)
Patient needs an orthopedic specialist that is in [place] - He broke his thumb in 2 places.  He has been seen at [facility] but they are not in network.  Please advise

## 2021-02-25 NOTE — Telephone Encounter (Signed)
Ok for referral to [facility].  Dx broken/fractured thumb

## 2021-02-25 NOTE — Telephone Encounter (Signed)
Thank you :)

## 2021-02-25 NOTE — Telephone Encounter (Signed)
I have called Dr. Blanchie [facility]) office to see if they accept insurance. Efraim Kaufmann has never heard of this insurance and is going to do some checking on it and call me back.   I called patient to advise I am actively working on finding a Hydrographic surveyor to see him and that the problem is no one accepts his insurance. He states that he has called his insurance company since last speaking to our office and was told the closest Hydrographic surveyor he would be able to see is in [place], [place].  He feels that he is unable to do this due to the heart problems he is currently having. I advised I would keep working to see if there is anyone closer that I can get him referred to.

## 2021-02-26 ENCOUNTER — Encounter: Payer: Self-pay | Admitting: *Deleted

## 2021-02-26 ENCOUNTER — Telehealth: Payer: Self-pay | Admitting: *Deleted

## 2021-02-26 ENCOUNTER — Encounter: Payer: 59 | Admitting: Internal Medicine

## 2021-02-26 NOTE — Telephone Encounter (Signed)
Called pt since he missed today's appt in the Anticoagulation Clinic and Cardiology Appt; had to leave a message to call back to reschedule.

## 2021-02-26 NOTE — Telephone Encounter (Signed)
I called Dr. Johnnye Lana office back. On hold for 10+ minutes. Will try again.

## 2021-02-27 NOTE — Telephone Encounter (Signed)
Could you let me know where you would like to put patient? I will be glad to call him and let him know Dr. Roda Shutters is going to see him and what time.I really appreciate all of you all's help.

## 2021-02-27 NOTE — Telephone Encounter (Signed)
Dr. Vivianne Spence wanted to me to see if there was anything that you would be able to offer him? He does not feel that he can make the trip to [place], [place] due to his heart condition. If not, I will be glad to tell him we have exhausted all options and that is what he will have to do.

## 2021-02-27 NOTE — Telephone Encounter (Signed)
Worked in patient for Wednesday.

## 2021-02-27 NOTE — Telephone Encounter (Signed)
I have literally called around to every orthopedics office I know that would do the hand surgery and no one accepts his insurance. I have even contacted [employer] to help me and they were NO HELP what so ever. Pt will need to contact his insurance to see where in [place] he is to go for the orthopedic surgery. I have spent most of day calling to see where I can send him.

## 2021-02-27 NOTE — Telephone Encounter (Signed)
Sure we can see him next wed

## 2021-03-05 ENCOUNTER — Ambulatory Visit (INDEPENDENT_AMBULATORY_CARE_PROVIDER_SITE_OTHER): Payer: 59 | Admitting: *Deleted

## 2021-03-05 ENCOUNTER — Other Ambulatory Visit: Payer: Self-pay

## 2021-03-05 ENCOUNTER — Encounter: Payer: Self-pay | Admitting: Orthopaedic Surgery

## 2021-03-05 ENCOUNTER — Ambulatory Visit (INDEPENDENT_AMBULATORY_CARE_PROVIDER_SITE_OTHER): Payer: 59 | Admitting: Orthopaedic Surgery

## 2021-03-05 ENCOUNTER — Ambulatory Visit (INDEPENDENT_AMBULATORY_CARE_PROVIDER_SITE_OTHER): Payer: 59

## 2021-03-05 DIAGNOSIS — Z5181 Encounter for therapeutic drug level monitoring: Secondary | ICD-10-CM | POA: Diagnosis not present

## 2021-03-05 DIAGNOSIS — S62212D Bennett's fracture, left hand, subsequent encounter for fracture with routine healing: Secondary | ICD-10-CM

## 2021-03-05 DIAGNOSIS — M79642 Pain in left hand: Secondary | ICD-10-CM | POA: Diagnosis not present

## 2021-03-05 DIAGNOSIS — Z952 Presence of prosthetic heart valve: Secondary | ICD-10-CM | POA: Diagnosis not present

## 2021-03-05 DIAGNOSIS — I4891 Unspecified atrial fibrillation: Secondary | ICD-10-CM | POA: Diagnosis not present

## 2021-03-05 LAB — POCT INR: INR: 5 — AB (ref 2.0–3.0)

## 2021-03-05 NOTE — Patient Instructions (Signed)
Description   Hold warfarin today, then continue taking 10mg  daily except 5mg  on Fridays. Recheck in 2 weeks. Coumadin Clinic [phone].
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
## 2021-03-27 ENCOUNTER — Ambulatory Visit (INDEPENDENT_AMBULATORY_CARE_PROVIDER_SITE_OTHER): Payer: 59

## 2021-03-27 ENCOUNTER — Other Ambulatory Visit: Payer: Self-pay

## 2021-03-27 ENCOUNTER — Other Ambulatory Visit: Payer: Self-pay | Admitting: Internal Medicine

## 2021-03-27 DIAGNOSIS — Z952 Presence of prosthetic heart valve: Secondary | ICD-10-CM | POA: Diagnosis not present

## 2021-03-27 DIAGNOSIS — I4891 Unspecified atrial fibrillation: Secondary | ICD-10-CM | POA: Diagnosis not present

## 2021-03-27 LAB — POCT INR: INR: 5.9 — AB (ref 2.0–3.0)

## 2021-03-27 NOTE — Telephone Encounter (Signed)
Prescription refill request received for warfarin Lov: 03/22/21 Noralyn Pick, NP)  Next INR check: 04/03/21 Warfarin tablet strength: 10mg 

## 2021-03-27 NOTE — Patient Instructions (Signed)
Description   Hold warfarin today and tomorrow then start taking 1 tablet daily except 1/2 tablet on Monday and Fridays. Recheck in 1 week. Coumadin Clinic [phone].

## 2021-04-01 ENCOUNTER — Ambulatory Visit: Payer: 59 | Admitting: Orthopaedic Surgery

## 2021-04-10 NOTE — Addendum Note (Signed)
Addended by: Malena Peer D on: 04/10/2021 04:19 PM   Modules accepted: Orders

## 2021-04-11 ENCOUNTER — Other Ambulatory Visit: Payer: Self-pay

## 2021-04-11 ENCOUNTER — Ambulatory Visit (INDEPENDENT_AMBULATORY_CARE_PROVIDER_SITE_OTHER): Payer: 59 | Admitting: Registered Nurse

## 2021-04-11 ENCOUNTER — Encounter: Payer: Self-pay | Admitting: Registered Nurse

## 2021-04-11 ENCOUNTER — Ambulatory Visit
Admission: RE | Admit: 2021-04-11 | Discharge: 2021-04-11 | Disposition: A | Discharge status: Home / Self Care | Payer: 59 | Source: Ambulatory Visit | Attending: Registered Nurse | Admitting: Registered Nurse

## 2021-04-11 VITALS — BP 96/70 | HR 71 | Temp 98.6°F | Resp 18 | Ht 72.0 in | Wt 246.2 lb

## 2021-04-11 DIAGNOSIS — Z1159 Encounter for screening for other viral diseases: Secondary | ICD-10-CM | POA: Diagnosis not present

## 2021-04-11 DIAGNOSIS — R200 Anesthesia of skin: Secondary | ICD-10-CM | POA: Diagnosis not present

## 2021-04-11 LAB — CBC
HCT: 40.3 % (ref 39.0–52.0)
Hemoglobin: 13.6 g/dL (ref 13.0–17.0)
MCHC: 33.6 g/dL (ref 30.0–36.0)
MCV: 92.7 fl (ref 78.0–100.0)
Platelets: 166 10*3/uL (ref 150.0–400.0)
RBC: 4.35 Mil/uL (ref 4.22–5.81)
RDW: 13.5 % (ref 11.5–15.5)
WBC: 5.6 10*3/uL (ref 4.0–10.5)

## 2021-04-11 LAB — COMPREHENSIVE METABOLIC PANEL
ALT: 29 U/L (ref 0–53)
AST: 22 U/L (ref 0–37)
Albumin: 4.3 g/dL (ref 3.5–5.2)
Alkaline Phosphatase: 61 U/L (ref 39–117)
BUN: 18 mg/dL (ref 6–23)
CO2: 24 mEq/L (ref 19–32)
Calcium: 9 mg/dL (ref 8.4–10.5)
Chloride: 104 mEq/L (ref 96–112)
Creatinine, Ser: 1.07 mg/dL (ref 0.40–1.50)
GFR: 76.86 mL/min (ref >60.00)
Glucose, Bld: 106 mg/dL — ABNORMAL HIGH (ref 70–99)
Potassium: 4.4 mEq/L (ref 3.5–5.1)
Sodium: 137 mEq/L (ref 135–145)
Total Bilirubin: 1.6 mg/dL — ABNORMAL HIGH (ref 0.2–1.2)
Total Protein: 6.7 g/dL (ref 6.0–8.3)

## 2021-04-11 LAB — B12 AND FOLATE PANEL
Folate: 10.9 ng/mL (ref >5.9)
Vitamin B-12: 170 pg/mL — ABNORMAL LOW (ref 211–911)

## 2021-04-11 LAB — VITAMIN D 25 HYDROXY (VIT D DEFICIENCY, FRACTURES): VITD: 33.25 ng/mL (ref 30.00–100.00)

## 2021-04-11 LAB — MAGNESIUM: Magnesium: 2.1 mg/dL (ref 1.5–2.5)
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
## 2021-04-11 NOTE — Patient Instructions (Addendum)
Mr. Qaadir [place] today will be back today or tomorrow. I'll call with urgent concerns.  Xrays ordered to [facility] at [employer]. They can typically accommodate walk ins for xrays. I'll follow up when results are available  Referral to hand specialist placed. They'll call in the next week or two.  Thank you  Rich     If you have lab work done today you will be contacted with your lab results within the next 2 weeks.  If you have not heard from [place] then please contact us. The fastest way to get your results is to register for My Chart.   IF you received an x-ray today, you will receive an invoice from [facility]. Please contact [facility] at [phone] with questions or concerns regarding your invoice.   IF you received labwork today, you will receive an invoice from [place]. Please contact [facility] at [phone] with questions or concerns regarding your invoice.   Our billing staff will not be able to assist you with questions regarding bills from these companies.  You will be contacted with the lab results as soon as they are available. The fastest way to get your results is to activate your My Chart account. Instructions are located on the last page of this paperwork. If you have not heard from [place] regarding the results in 2 weeks, please contact this office.

## 2021-04-14 ENCOUNTER — Other Ambulatory Visit: Payer: Self-pay | Admitting: Internal Medicine

## 2021-04-14 ENCOUNTER — Encounter: Payer: Self-pay | Admitting: Registered Nurse

## 2021-04-14 LAB — HEPATITIS C ANTIBODY
Hepatitis C Ab: NONREACTIVE
SIGNAL TO CUT-OFF: 0.01 (ref <1.00)

## 2021-04-16 ENCOUNTER — Other Ambulatory Visit: Payer: Self-pay | Admitting: Family Medicine

## 2021-04-19 ENCOUNTER — Other Ambulatory Visit: Payer: Self-pay | Admitting: Internal Medicine

## 2021-04-19 DIAGNOSIS — I4891 Unspecified atrial fibrillation: Secondary | ICD-10-CM

## 2021-04-19 DIAGNOSIS — Z952 Presence of prosthetic heart valve: Secondary | ICD-10-CM

## 2021-04-21 NOTE — Telephone Encounter (Signed)
Pt overdue for Anticoagulation Appt; was due on 04/03/21. He has an appt on 8/26 with Anticoagulation Clinic & Dr. Johney Frame. Last refill sent 03/27/21 for 30 tablets and 1 refill. Will wait to send refill

## 2021-04-24 ENCOUNTER — Ambulatory Visit: Payer: 59 | Admitting: Orthopaedic Surgery

## 2021-04-25 ENCOUNTER — Encounter: Payer: 59 | Admitting: Internal Medicine

## 2021-04-25 DIAGNOSIS — Z952 Presence of prosthetic heart valve: Secondary | ICD-10-CM

## 2021-04-25 DIAGNOSIS — I480 Paroxysmal atrial fibrillation: Secondary | ICD-10-CM

## 2021-04-25 NOTE — Telephone Encounter (Signed)
Pt missed appt 8/26, LMOM TCB to r/s appt.

## 2021-04-29 ENCOUNTER — Telehealth: Payer: Self-pay

## 2021-05-06 NOTE — Telephone Encounter (Signed)
Left message for the pt to call back & reschedule his missed appt; unable to send in a warfarin refill as the med requires monitoring.

## 2021-05-07 ENCOUNTER — Other Ambulatory Visit (HOSPITAL_COMMUNITY): Payer: Self-pay | Admitting: Internal Medicine

## 2021-05-09 ENCOUNTER — Telehealth: Payer: Self-pay | Admitting: *Deleted

## 2021-05-09 NOTE — Telephone Encounter (Signed)
Pt is overdue for an INR check and has been called multiple times without success to reschedule missed appt. Called pt today and had to leave a message for the pt to call back to be rescheduled.

## 2021-05-16 ENCOUNTER — Ambulatory Visit (INDEPENDENT_AMBULATORY_CARE_PROVIDER_SITE_OTHER): Payer: 59

## 2021-05-16 ENCOUNTER — Other Ambulatory Visit: Payer: Self-pay

## 2021-05-16 DIAGNOSIS — Z5181 Encounter for therapeutic drug level monitoring: Secondary | ICD-10-CM | POA: Diagnosis not present

## 2021-05-16 DIAGNOSIS — I4891 Unspecified atrial fibrillation: Secondary | ICD-10-CM | POA: Diagnosis not present

## 2021-05-16 DIAGNOSIS — Z952 Presence of prosthetic heart valve: Secondary | ICD-10-CM

## 2021-05-16 LAB — POCT INR: INR: 2.4 (ref 2.0–3.0)

## 2021-05-16 NOTE — Patient Instructions (Signed)
Description   Take 1 tablet today and then 1.5 tomorrow and then continue taking 1 tablet daily except 1/2 tablet on Fridays. Recheck in 2 weeks (per pt request). Coumadin Clinic [phone].

## 2021-05-27 ENCOUNTER — Telehealth: Payer: Self-pay | Admitting: Orthopaedic Surgery

## 2021-05-27 NOTE — Telephone Encounter (Signed)
Called pt, left message.

## 2021-05-27 NOTE — Telephone Encounter (Signed)
Pt called. He states that it is really swollen, that the knee popped and its hurting a lot. He would like to be worked in asap.   CB [phone]

## 2021-05-27 NOTE — Telephone Encounter (Signed)
Called and spoke with patient. Worked him in for tomorrow morning with Dr.Whitfield.

## 2021-05-28 ENCOUNTER — Ambulatory Visit (INDEPENDENT_AMBULATORY_CARE_PROVIDER_SITE_OTHER): Payer: 59 | Admitting: Orthopaedic Surgery

## 2021-05-28 ENCOUNTER — Encounter: Payer: Self-pay | Admitting: Orthopaedic Surgery

## 2021-05-28 ENCOUNTER — Telehealth: Payer: Self-pay | Admitting: Orthopaedic Surgery

## 2021-05-28 ENCOUNTER — Ambulatory Visit: Payer: Self-pay

## 2021-05-28 DIAGNOSIS — R2242 Localized swelling, mass and lump, left lower limb: Secondary | ICD-10-CM

## 2021-05-28 DIAGNOSIS — M25562 Pain in left knee: Secondary | ICD-10-CM | POA: Diagnosis not present

## 2021-05-28 MED ORDER — LIDOCAINE HCL 1 % IJ SOLN
2.0000 mL | INTRAMUSCULAR | Status: AC | PRN
  Administered 2021-05-28: 2 mL

## 2021-05-28 MED ORDER — OXYCODONE-ACETAMINOPHEN 5-325 MG PO TABS: 1.0000 | ORAL_TABLET | Freq: Four times a day (QID) | ORAL | 0 refills | Status: DC | PRN

## 2021-05-28 MED ORDER — METHYLPREDNISOLONE ACETATE 40 MG/ML IJ SUSP
80.0000 mg | INTRAMUSCULAR | Status: AC | PRN
  Administered 2021-05-28: 80 mg via INTRA_ARTICULAR

## 2021-05-28 MED ORDER — BUPIVACAINE HCL 0.25 % IJ SOLN
2.0000 mL | INTRAMUSCULAR | Status: AC | PRN
  Administered 2021-05-28: 2 mL via INTRA_ARTICULAR

## 2021-05-28 NOTE — Telephone Encounter (Signed)
Pt needs a note stating he can work and was at the office today.   CB [phone]

## 2021-05-28 NOTE — Telephone Encounter (Signed)
Spoke with patient. Emailed note to [email or web address]
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
## 2021-05-28 NOTE — Telephone Encounter (Signed)
Ok for note as above

## 2021-05-29 ENCOUNTER — Telehealth: Payer: Self-pay | Admitting: Orthopaedic Surgery

## 2021-05-29 NOTE — Telephone Encounter (Signed)
Pt states that he can't open the letter. Wondering if you can resend it? Also state he fell on his knee twice. Wondering if he could get scheduled for a ct scan.   CB [phone]

## 2021-05-29 NOTE — Telephone Encounter (Signed)
Ok for CT left knee-acute onset anterior medial pain with inability to flex

## 2021-05-30 ENCOUNTER — Other Ambulatory Visit: Payer: Self-pay

## 2021-05-30 DIAGNOSIS — M25562 Pain in left knee: Secondary | ICD-10-CM

## 2021-05-30 NOTE — Telephone Encounter (Signed)
Called and spoke with patient. CT scan has been ordered. He is unable to open the note I sent to him in the email. He will pick up a copy from the front desk on Monday.

## 2021-06-20 ENCOUNTER — Ambulatory Visit (HOSPITAL_COMMUNITY)
Admission: RE | Admit: 2021-06-20 | Discharge: 2021-06-20 | Disposition: A | Discharge status: Home / Self Care | Payer: 59 | Source: Ambulatory Visit | Attending: Orthopaedic Surgery | Admitting: Orthopaedic Surgery

## 2021-06-20 ENCOUNTER — Other Ambulatory Visit: Payer: Self-pay

## 2021-06-20 DIAGNOSIS — M25562 Pain in left knee: Secondary | ICD-10-CM | POA: Diagnosis present

## 2021-06-24 ENCOUNTER — Encounter: Payer: Self-pay | Admitting: Orthopaedic Surgery

## 2021-06-25 ENCOUNTER — Other Ambulatory Visit: Payer: Self-pay

## 2021-06-25 ENCOUNTER — Ambulatory Visit (INDEPENDENT_AMBULATORY_CARE_PROVIDER_SITE_OTHER): Payer: 59 | Admitting: *Deleted

## 2021-06-25 ENCOUNTER — Ambulatory Visit (INDEPENDENT_AMBULATORY_CARE_PROVIDER_SITE_OTHER): Payer: 59 | Admitting: Internal Medicine

## 2021-06-25 ENCOUNTER — Ambulatory Visit (HOSPITAL_COMMUNITY)
Admission: RE | Admit: 2021-06-25 | Discharge: 2021-06-25 | Disposition: A | Discharge status: Home / Self Care | Payer: 59 | Source: Ambulatory Visit | Attending: Internal Medicine | Admitting: Internal Medicine

## 2021-06-25 VITALS — BP 98/64 | HR 56 | Ht 70.0 in | Wt 250.0 lb

## 2021-06-25 DIAGNOSIS — I77810 Thoracic aortic ectasia: Secondary | ICD-10-CM | POA: Insufficient documentation

## 2021-06-25 DIAGNOSIS — I4891 Unspecified atrial fibrillation: Secondary | ICD-10-CM | POA: Diagnosis not present

## 2021-06-25 DIAGNOSIS — Z952 Presence of prosthetic heart valve: Secondary | ICD-10-CM | POA: Diagnosis not present

## 2021-06-25 DIAGNOSIS — I5032 Chronic diastolic (congestive) heart failure: Secondary | ICD-10-CM | POA: Insufficient documentation

## 2021-06-25 DIAGNOSIS — G4730 Sleep apnea, unspecified: Secondary | ICD-10-CM | POA: Insufficient documentation

## 2021-06-25 DIAGNOSIS — E785 Hyperlipidemia, unspecified: Secondary | ICD-10-CM | POA: Insufficient documentation

## 2021-06-25 DIAGNOSIS — I352 Nonrheumatic aortic (valve) stenosis with insufficiency: Secondary | ICD-10-CM | POA: Insufficient documentation

## 2021-06-25 DIAGNOSIS — I480 Paroxysmal atrial fibrillation: Secondary | ICD-10-CM | POA: Diagnosis not present

## 2021-06-25 DIAGNOSIS — Z7901 Long term (current) use of anticoagulants: Secondary | ICD-10-CM

## 2021-06-25 LAB — ECHOCARDIOGRAM COMPLETE
AR max vel: 1.92 cm2
AV Area VTI: 1.82 cm2
AV Area mean vel: 1.91 cm2
AV Mean grad: 30.6 mmHg
AV Peak grad: 55.2 mmHg
Ao pk vel: 3.71 m/s
Area-P 1/2: 4.31 cm2
Calc EF: 60 %
S' Lateral: 3.2 cm
Single Plane A2C EF: 55.5 %
Single Plane A4C EF: 64.3 %

## 2021-06-25 LAB — POCT INR: INR: 5.2 — AB (ref 2.0–3.0)
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
## 2021-06-25 NOTE — Patient Instructions (Addendum)
Description   Do not take any Warfarin today then continue taking 1 tablet daily except 1/2 tablet on Fridays. Recheck on Monday Coumadin Clinic [phone].

## 2021-06-25 NOTE — Progress Notes (Signed)
  Echocardiogram 2D Echocardiogram has been performed.  Janalyn Harder 06/25/2021, 9:52 AM

## 2021-06-25 NOTE — Patient Instructions (Addendum)
Medication Instructions:  Your physician recommends that you continue on your current medications as directed. Please refer to the Current Medication list given to you today. *If you need a refill on your cardiac medications before your next appointment, please call your pharmacy*  Lab Work: None. If you have labs (blood work) drawn today and your tests are completely normal, you will receive your results only by: MyChart Message (if you have MyChart) OR A paper copy in the mail If you have any lab test that is abnormal or we need to change your treatment, we will call you to review the results.  Testing/Procedures: None.  Follow-Up: At [facility], you and your health needs are our priority.  As part of our continuing mission to provide you with exceptional heart care, we have created designated Provider Care Teams.  These Care Teams include your primary Cardiologist (physician) and Advanced Practice Providers (APPs -  Physician Assistants and Nurse Practitioners) who all work together to provide you with the care you need, when you need it.  Your physician wants you to follow-up in: 06/30/21 at 8 am with Dr. Johney Frame.  We recommend signing up for the patient portal called "MyChart".  Sign up information is provided on this After Visit Summary.  MyChart is used to connect with patients for Virtual Visits (Telemedicine).  Patients are able to view lab/test results, encounter notes, upcoming appointments, etc.  Non-urgent messages can be sent to your provider as well.   To learn more about what you can do with MyChart, go to [email or web address].    Any Other Special Instructions Will Be Listed Below (If Applicable).

## 2021-06-30 ENCOUNTER — Ambulatory Visit (INDEPENDENT_AMBULATORY_CARE_PROVIDER_SITE_OTHER): Payer: 59 | Admitting: Internal Medicine

## 2021-06-30 ENCOUNTER — Encounter: Payer: Self-pay | Admitting: Internal Medicine

## 2021-06-30 ENCOUNTER — Ambulatory Visit (INDEPENDENT_AMBULATORY_CARE_PROVIDER_SITE_OTHER): Payer: 59 | Admitting: *Deleted

## 2021-06-30 ENCOUNTER — Other Ambulatory Visit: Payer: Self-pay

## 2021-06-30 DIAGNOSIS — I4891 Unspecified atrial fibrillation: Secondary | ICD-10-CM | POA: Diagnosis not present

## 2021-06-30 DIAGNOSIS — Z952 Presence of prosthetic heart valve: Secondary | ICD-10-CM | POA: Diagnosis not present

## 2021-06-30 DIAGNOSIS — Z7901 Long term (current) use of anticoagulants: Secondary | ICD-10-CM

## 2021-06-30 LAB — POCT INR: INR: 1.9 — AB (ref 2.0–3.0)

## 2021-06-30 NOTE — Patient Instructions (Addendum)
Description   After procedure & once ok to resume take an extra 1/2 tablet for 2 days then continue taking 1 tablet daily except 1/2 tablet on Fridays. Recheck in 2 weeks. Coumadin Clinic [phone].

## 2021-06-30 NOTE — Progress Notes (Signed)
SURGEON:  Hillis Range, MD     PREPROCEDURE DIAGNOSIS:  Atrial fibrillation, palpitations    POSTPROCEDURE DIAGNOSIS:  Atrial fibrillation, palpitations     PROCEDURES:   1. Implantable loop recorder explantation with new device re-implantation    INTRODUCTION:  Mark [name] is a 60 y.o. male with a history of palpitations and atrial fibrillation who presents today for implantable loop implantation.  The patient has had difficult to manage atrial fibrillation with frequent palpitations.  The patient is also s/p prior atrial fibrillation ablation x 2. There is significant concern for possible atrial fibrillation as the cause for the palpitations.  In addition long term afib management is felt to require additional monitoring.  The patient therefore presents today for implantable loop implantation.     DESCRIPTION OF PROCEDURE:  Informed written consent was obtained.  The patient required no sedation for the procedure today.  The patients left chest was therefore prepped and draped in the usual sterile fashion.  The skin overlying the ILR monitor was infiltrated with lidocaine for local analgesia.  A 0.5-cm incision was made over the site.  The previously implanted ILR was exposed and removed using a combination of sharp and blunt dissection.   A new Medtronic Reveal Linq model [id number] (SN [id number]) implantable loop recorder was then placed into the pocket  R waves were very prominent and measured >0.79mV.  Steri- Strips and a sterile dressing were then applied.  There were no early apparent complications.     CONCLUSIONS:   1. Successful explantation of a prior MDT Reveal LINQ explantation with subsequent implantation of a new Medtronic Reveal LINQ implantable loop recorder for palpitations and recurrent symptoms of atrial fibrillation  2. No early apparent complications.   Hillis Range MD, [facility] 06/30/2021 8:37 AM

## 2021-07-14 ENCOUNTER — Other Ambulatory Visit: Payer: Self-pay | Admitting: Family Medicine

## 2021-07-18 ENCOUNTER — Other Ambulatory Visit: Payer: Self-pay

## 2021-07-18 ENCOUNTER — Ambulatory Visit (INDEPENDENT_AMBULATORY_CARE_PROVIDER_SITE_OTHER): Payer: 59 | Admitting: *Deleted

## 2021-07-18 DIAGNOSIS — Z5181 Encounter for therapeutic drug level monitoring: Secondary | ICD-10-CM | POA: Diagnosis not present

## 2021-07-18 DIAGNOSIS — Z952 Presence of prosthetic heart valve: Secondary | ICD-10-CM

## 2021-07-18 DIAGNOSIS — I4891 Unspecified atrial fibrillation: Secondary | ICD-10-CM

## 2021-07-18 LAB — POCT INR: INR: 5.3 — AB (ref 2.0–3.0)

## 2021-07-18 NOTE — Patient Instructions (Signed)
Description   Hold warfarin today, then start taking warfarin 1 tablet daily except for 1/2 a tablet on Mondays and Fridays. Recheck INR in 2 weeks. Coumadin Clinic [phone]

## 2021-08-01 ENCOUNTER — Telehealth: Payer: Self-pay

## 2021-08-01 NOTE — Telephone Encounter (Signed)
Pt missed anti-coag appt. Called, no answer. LMOM

## 2021-08-04 ENCOUNTER — Ambulatory Visit (INDEPENDENT_AMBULATORY_CARE_PROVIDER_SITE_OTHER): Payer: 59

## 2021-08-04 DIAGNOSIS — I480 Paroxysmal atrial fibrillation: Secondary | ICD-10-CM | POA: Diagnosis not present

## 2021-08-04 LAB — CUP PACEART REMOTE DEVICE CHECK
Date Time Interrogation Session: 20221203065600
Implantable Pulse Generator Implant Date: 20221031

## 2021-08-05 ENCOUNTER — Ambulatory Visit (HOSPITAL_COMMUNITY)
Admission: RE | Admit: 2021-08-05 | Discharge: 2021-08-05 | Disposition: A | Discharge status: Home / Self Care | Payer: 59 | Source: Ambulatory Visit | Attending: Internal Medicine | Admitting: Internal Medicine

## 2021-08-05 ENCOUNTER — Ambulatory Visit (INDEPENDENT_AMBULATORY_CARE_PROVIDER_SITE_OTHER): Payer: 59 | Admitting: *Deleted

## 2021-08-05 ENCOUNTER — Other Ambulatory Visit: Payer: Self-pay

## 2021-08-05 ENCOUNTER — Encounter (HOSPITAL_COMMUNITY): Payer: Self-pay | Admitting: Internal Medicine

## 2021-08-05 VITALS — BP 112/76 | HR 95 | Wt 253.2 lb

## 2021-08-05 DIAGNOSIS — I10 Essential (primary) hypertension: Secondary | ICD-10-CM

## 2021-08-05 DIAGNOSIS — I110 Hypertensive heart disease with heart failure: Secondary | ICD-10-CM | POA: Insufficient documentation

## 2021-08-05 DIAGNOSIS — Z8616 Personal history of COVID-19: Secondary | ICD-10-CM | POA: Diagnosis not present

## 2021-08-05 DIAGNOSIS — I5032 Chronic diastolic (congestive) heart failure: Secondary | ICD-10-CM

## 2021-08-05 DIAGNOSIS — Z91199 Patient's noncompliance with other medical treatment and regimen due to unspecified reason: Secondary | ICD-10-CM | POA: Insufficient documentation

## 2021-08-05 DIAGNOSIS — E785 Hyperlipidemia, unspecified: Secondary | ICD-10-CM | POA: Diagnosis not present

## 2021-08-05 DIAGNOSIS — I509 Heart failure, unspecified: Secondary | ICD-10-CM | POA: Diagnosis not present

## 2021-08-05 DIAGNOSIS — I4891 Unspecified atrial fibrillation: Secondary | ICD-10-CM | POA: Diagnosis not present

## 2021-08-05 DIAGNOSIS — Q2543 Congenital aneurysm of aorta: Secondary | ICD-10-CM | POA: Diagnosis not present

## 2021-08-05 DIAGNOSIS — T82858D Stenosis of vascular prosthetic devices, implants and grafts, subsequent encounter: Secondary | ICD-10-CM | POA: Diagnosis not present

## 2021-08-05 DIAGNOSIS — E669 Obesity, unspecified: Secondary | ICD-10-CM | POA: Diagnosis not present

## 2021-08-05 DIAGNOSIS — Y838 Other surgical procedures as the cause of abnormal reaction of the patient, or of later complication, without mention of misadventure at the time of the procedure: Secondary | ICD-10-CM | POA: Diagnosis not present

## 2021-08-05 DIAGNOSIS — I480 Paroxysmal atrial fibrillation: Secondary | ICD-10-CM

## 2021-08-05 DIAGNOSIS — Z952 Presence of prosthetic heart valve: Secondary | ICD-10-CM

## 2021-08-05 DIAGNOSIS — I7121 Aneurysm of the ascending aorta, without rupture: Secondary | ICD-10-CM | POA: Diagnosis not present

## 2021-08-05 DIAGNOSIS — Z7901 Long term (current) use of anticoagulants: Secondary | ICD-10-CM

## 2021-08-05 DIAGNOSIS — G4733 Obstructive sleep apnea (adult) (pediatric): Secondary | ICD-10-CM | POA: Insufficient documentation

## 2021-08-05 DIAGNOSIS — Z6836 Body mass index (BMI) 36.0-36.9, adult: Secondary | ICD-10-CM | POA: Insufficient documentation

## 2021-08-05 LAB — POCT INR: INR: 2.5 (ref 2.0–3.0)

## 2021-08-05 NOTE — Patient Instructions (Signed)
Description   Today and tomorrow take 1.5 tablets then start taking warfarin 1 tablet daily except for 1/2 tablet on Fridays. Recheck INR in 2 weeks. Coumadin Clinic [phone]

## 2021-08-05 NOTE — Addendum Note (Signed)
Encounter addended by: Linda Hedges, RN on: 08/05/2021 2:32 PM  Actions taken: Clinical Note Signed

## 2021-08-05 NOTE — Progress Notes (Addendum)
Patient ID: Mark [name], male   DOB: 11-20-1960, 60 y.o.   MRN: [id number]    Advanced Heart Failure Clinic Note    PCP: [name] HF: Dr. Haroldine Laws   HPI:  Rush Landmark is a 60 year old male with a history of bicuspid aortic valve complicated by endocarditis.  He is status post St. Jude mechanical aortic valve replacement in 1983.  He also has a history of hyperlipidemia and PAF s/p DC-CV in 9/11. OSA noncompliant with CPAP.   Over the past few years year, he has had some progression in the gradients across his valve.  He underwent cardiac catheterization in May 2009 which showed normal coronary arteries.The valve leaflets were seen to be opening well on fluoroscopy.  Had TEE.  While there was some turbulence around the valve, the leaflets seemed to be moving well.  There was no obvious pannus formation.  Gradient on his transthoracic echo was within the moderate range at 33. 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 He was seen by Dr. Roxy Manns who agreed  with continue watchul waiting.   Has struggled with AF/palpitations.  Has seen Dr. Rayann Heman and had two ablations in 8/13 and 3/14. Now has Linq monitor in (placed 04/06/17). Seen in AF Clinic in 7/21 AF burden was low. Most recent device interrogation from 12/21 reviewed AF burden 13.9% with longest event 49 hr duration (overall AF burden over life of ILR ~5%). AF worse during COVID   CT chest 4/22: Stable dilation of ascending aorta 4.8 cm  He returns for regular f/u. Frustrated about his 20 pound weight gain. Only able to sleep 4-6 hours per night. Still working FT. Gets SOB with mild activity. No edema, orthopnea or PND. No bleeding with warfarin.    Echo 10/22 EF 60% moderate AS mean gradient 76mmHG Personally reviewed  Cardiac studies:  Echo 2/21: EF 60-65% Aortic valve mean gradient measures 29.0 mmHg. Aortic valve peak gradient  measures 57.8 mmHg. AVA 1.2 cm2 Personally reviewed  Echo 6/19: EF 55-60% AoV Mean  gradient (S): 27 mm Hg  Echo 7/18  EF 55-60% AoV Mean gradient (S): 26 mm Hg. Peak gradient (S): 51 mm Hg. AVA 1.35 cm2 Echo 12/14/2014 LVEF 55-60%, Aortic valve gradient 44 to 60 mm Hg, increased from 22 to 35 mm Hg. Echo 4/17 EF 60-65%  AoV Mean gradient (S): 34 mm Hg. Peak gradient (S): 63 mm Hg.  Review of systems complete and found to be negative unless listed in HPI.   Past Medical History:  Diagnosis Date   Aortic stenosis    a. due to bicuspid AoV; 1983 S/p mechanical AVR (St. Jude) - chronic coumadin with INR run between 3.5-4.5;  b. 03/2012 Echo: EF 60%, nl wall motion, mod dil LA.   Ascending aortic aneurysm    Atrial tachycardia ([place])    a. admitted 07/2012   CHF (congestive heart failure) ([place])    Coronary artery disease    COVID janurary 2022   ED (erectile dysfunction)    Gallstones    H/O cardiac catheterization    a. 12/2007 Cath: nl cors.   Headache(784.0)    Hepatitis A    a. as a child (from [place])   Hypertension    OSA (obstructive sleep apnea)    a. noncompliant with CPAP   Paroxysmal atrial fibrillation ([facility])    s/p afib ablation x 2    Current Outpatient Medications  Medication Sig Dispense Refill   albuterol (VENTOLIN HFA) 108 (  90 Base) MCG/ACT inhaler Inhale 2 puffs into the lungs every 6 (six) hours as needed for wheezing or shortness of breath. 8 g 0   aspirin EC 81 MG tablet Take 81 mg by mouth at bedtime.      carvedilol (COREG) 6.25 MG tablet Take 6.25 mg by mouth 2 (two) times daily with a meal.     diltiazem (CARDIZEM) 60 MG tablet Take one tablet by mouth every 6 hours as needed for afib 15 tablet 1   esomeprazole (NEXIUM) 20 MG capsule Take 20 mg by mouth at bedtime.     lisinopril (ZESTRIL) 40 MG tablet TAKE 1 TABLET BY MOUTH EVERY DAY 90 tablet 3   meclizine (ANTIVERT) 25 MG tablet Take 1 tablet (25 mg total) by mouth 3 (three) times daily as needed for dizziness. 30 tablet 0   simvastatin (ZOCOR) 20 MG tablet TAKE 1 TABLET BY MOUTH  EVERYDAY AT BEDTIME 90 tablet 0   spironolactone (ALDACTONE) 25 MG tablet TAKE 1 TABLET BY MOUTH EVERY DAY 90 tablet 3   SYMBICORT 80-4.5 MCG/ACT inhaler INHALE 2 PUFFS INTO THE LUNGS TWICE A DAY (Patient taking differently: as needed.) 10.2 each 0   verapamil (CALAN-SR) 120 MG CR tablet TAKE 1 TABLET BY MOUTH EACH NIGHT AT BEDTIME 90 tablet 3   warfarin (COUMADIN) 10 MG tablet TAKE 1/2 TO 1 TABLET DAILY AS DIRECTED BY COUMADIN CLINIC 30 tablet 1   No current facility-administered medications for this encounter.    PHYSICAL EXAM: Vitals:   08/05/21 1346  BP: 112/76  Pulse: 95  SpO2: 96%  Weight: 114.9 kg (253 lb 3.2 oz)   Wt Readings from Last 3 Encounters:  08/05/21 114.9 kg (253 lb 3.2 oz)  06/25/21 113.4 kg (250 lb)  04/11/21 111.7 kg (246 lb 3.2 oz)    General:  Well appearing. No resp difficulty HEENT: normal Neck: supple. no JVD. Carotids 2+ bilat; + bruits. No lymphadenopathy or thryomegaly appreciated. Cor: PMI nondisplaced. irregular rate & rhythm. 2/6 AM mechanical s2 crisp Lungs: clear Abdomen: obese soft, nontender, nondistended. No hepatosplenomegaly. No bruits or masses. Good bowel sounds. Extremities: no cyanosis, clubbing, rash, edema Neuro: alert & orientedx3, cranial nerves grossly intact. moves all 4 extremities w/o difficulty. Affect pleasant  1. Aortic stenosis s/p mechanical AVR - Echo 03/02/17 EF normal Mean AoV gradient 26.  - Echo 6/19 AVR stable mean gradient 27 - Echo 2/21: EF 60-65% Aortic valve mean gradient measures 29.0 mmHg. Aortic valve peak gradient measures 57.8 mmHg. AVA 1.2 cm2  - Echo 10/22 EF 60% moderate AS mean gradient Personally reviewed - Aortic valve stable - Reminded about SBE prophylaxis 2. HTN - Blood pressure well controlled. Continue current regimen. 3. Aortic root aneurysm - Previously followed by Dr. [name].  - CT scan 2/20 which measures 5.0 x 4.9 cm, previously 4.8 x 4.6 cm measured similarly on examinations  previously - CT chest 4/22: Stable dilation of ascending aorta 4.8 cm - HR stable. BP ok  4. Lipids - Followed by Dr. Beverely Low.  5. Atrial fibrillation  - Followed by Dr. Johney Frame. Has LINQ in place  - Recently had Linq replaced to follow burden - Is in AF today. Rate controlled - continue coumadin  6. OSA - unable to tolerate to CPAP - has appointment with dentist to try dental appliance 7. Obesity - discussed [employer] and possible GLP1-RA - refer to [facility]  Arvilla Meres, MD 1:55 PM

## 2021-08-05 NOTE — Addendum Note (Signed)
Encounter addended by: Linda Hedges, RN on: 08/05/2021 2:31 PM  Actions taken: Visit diagnoses modified, Order list changed, Diagnosis association updated, Clinical Note Signed

## 2021-08-05 NOTE — Patient Instructions (Addendum)
Medication Changes:  No change  Lab Work:  none  Testing/Procedures:  none  Referrals:  You have been referred to [facility], they will call you with appointment   Special Instructions // Education:  none  Follow-Up in: 1 year with Echocardiogram (December 2023) **Call in October 2023 for appointment**  At the [facility] Clinic, you and your health needs are our priority. We have a designated team specialized in the treatment of Heart Failure. This Care Team includes your primary Heart Failure Specialized Cardiologist (physician), Advanced Practice Providers (APPs- Physician Assistants and Nurse Practitioners), and Pharmacist who all work together to provide you with the care you need, when you need it.   You may see any of the following providers on your designated Care Team at your next follow up:  Dr Arvilla Meres Dr Carron Curie, NP Robbie Lis, [place] [facility] [place], [place] Karle Plumber, PharmD   Please be sure to bring in all your medications bottles to every appointment.   Need to Contact us:  If you have any questions or concerns before your next appointment please send [place] a message through [place] or call our office at [phone].    TO LEAVE A MESSAGE FOR THE NURSE SELECT OPTION 2, PLEASE LEAVE A MESSAGE INCLUDING: YOUR NAME DATE OF BIRTH CALL BACK NUMBER REASON FOR CALL**this is important as we prioritize the call backs  YOU WILL RECEIVE A CALL BACK THE SAME DAY AS LONG AS YOU CALL BEFORE 4:00 PM

## 2021-08-13 NOTE — Progress Notes (Signed)
Carelink Summary Report / Loop Recorder 

## 2021-08-14 ENCOUNTER — Other Ambulatory Visit: Payer: Self-pay | Admitting: Internal Medicine

## 2021-08-14 DIAGNOSIS — Z952 Presence of prosthetic heart valve: Secondary | ICD-10-CM

## 2021-08-14 DIAGNOSIS — I4891 Unspecified atrial fibrillation: Secondary | ICD-10-CM

## 2021-08-20 ENCOUNTER — Encounter: Payer: Self-pay | Admitting: Family Medicine

## 2021-08-20 ENCOUNTER — Ambulatory Visit (INDEPENDENT_AMBULATORY_CARE_PROVIDER_SITE_OTHER): Payer: 59 | Admitting: Family Medicine

## 2021-08-20 DIAGNOSIS — E8881 Metabolic syndrome: Secondary | ICD-10-CM | POA: Diagnosis not present

## 2021-08-20 DIAGNOSIS — E785 Hyperlipidemia, unspecified: Secondary | ICD-10-CM | POA: Diagnosis not present

## 2021-08-20 DIAGNOSIS — Z23 Encounter for immunization: Secondary | ICD-10-CM | POA: Diagnosis not present

## 2021-08-20 LAB — CBC WITH DIFFERENTIAL/PLATELET
Basophils Absolute: 0.1 10*3/uL (ref 0.0–0.1)
Basophils Relative: 0.9 % (ref 0.0–3.0)
Eosinophils Absolute: 0.1 10*3/uL (ref 0.0–0.7)
Eosinophils Relative: 1.6 % (ref 0.0–5.0)
HCT: 38.6 % — ABNORMAL LOW (ref 39.0–52.0)
Hemoglobin: 12.9 g/dL — ABNORMAL LOW (ref 13.0–17.0)
Lymphocytes Relative: 28.2 % (ref 12.0–46.0)
Lymphs Abs: 1.9 10*3/uL (ref 0.7–4.0)
MCHC: 33.3 g/dL (ref 30.0–36.0)
MCV: 92.8 fl (ref 78.0–100.0)
Monocytes Absolute: 0.5 10*3/uL (ref 0.1–1.0)
Monocytes Relative: 8 % (ref 3.0–12.0)
Neutro Abs: 4.2 10*3/uL (ref 1.4–7.7)
Neutrophils Relative %: 61.3 % (ref 43.0–77.0)
Platelets: 159 10*3/uL (ref 150.0–400.0)
RBC: 4.16 Mil/uL — ABNORMAL LOW (ref 4.22–5.81)
RDW: 13 % (ref 11.5–15.5)
WBC: 6.8 10*3/uL (ref 4.0–10.5)

## 2021-08-20 LAB — LIPID PANEL
Cholesterol: 159 mg/dL (ref 0–200)
HDL: 36 mg/dL — ABNORMAL LOW (ref >39.00)
LDL Cholesterol: 86 mg/dL (ref 0–99)
NonHDL: 123.19
Total CHOL/HDL Ratio: 4
Triglycerides: 188 mg/dL — ABNORMAL HIGH (ref 0.0–149.0)
VLDL: 37.6 mg/dL (ref 0.0–40.0)

## 2021-08-20 LAB — HEPATIC FUNCTION PANEL
ALT: 20 U/L (ref 0–53)
AST: 17 U/L (ref 0–37)
Albumin: 4.4 g/dL (ref 3.5–5.2)
Alkaline Phosphatase: 52 U/L (ref 39–117)
Bilirubin, Direct: 0.2 mg/dL (ref 0.0–0.3)
Total Bilirubin: 1.3 mg/dL — ABNORMAL HIGH (ref 0.2–1.2)
Total Protein: 6.8 g/dL (ref 6.0–8.3)

## 2021-08-20 LAB — BASIC METABOLIC PANEL
BUN: 17 mg/dL (ref 6–23)
CO2: 27 mEq/L (ref 19–32)
Calcium: 9.1 mg/dL (ref 8.4–10.5)
Chloride: 103 mEq/L (ref 96–112)
Creatinine, Ser: 1 mg/dL (ref 0.40–1.50)
GFR: 83.15 mL/min (ref >60.00)
Glucose, Bld: 84 mg/dL (ref 70–99)
Potassium: 4.3 mEq/L (ref 3.5–5.1)
Sodium: 137 mEq/L (ref 135–145)

## 2021-08-20 LAB — HEMOGLOBIN A1C: Hgb A1c MFr Bld: 5.8 % (ref 4.6–6.5)

## 2021-08-20 LAB — TSH: TSH: 1.75 u[IU]/mL (ref 0.35–5.50)

## 2021-08-20 NOTE — Assessment & Plan Note (Signed)
Ongoing issue for pt.  BMI is 37.08 but given his other medical issues, this qualifies as morbidly obese.  Encouraged low carb diet and regular exercise, but his abdominal obesity poses a serious health risk.  Will attempt to get him started on Ozempic b/c he meets the criteria for metabolic syndrome.  Unfortunately the wait time for [facility] was 10 months.  Will follow this closely.

## 2021-08-20 NOTE — Progress Notes (Signed)
° °  Subjective:    Patient ID: Mark [name], male    DOB: 1960/12/22, 60 y.o.   MRN: [id number]  HPI Obesity- pt has gained 15 lbs since November 2021.  Saw Dr Gala Romney earlier this month and they discussed need for weight loss.  He was referred to [facility] and they were booking 10 months out.  He is not able to take phentermine due to uncontrolled Afib.   Metabolic syndrome- + abdominal obesity, HTN, low HDL, high trigs.  We will check labs today to assess sugar levels.   Hyperlipidemia- chronic problem, on Simvastatin 20mg  nightly.  Has not had labs done since 2020.   Review of Systems For ROS see HPI   This visit occurred during the SARS-CoV-2 public health emergency.  Safety protocols were in place, including screening questions prior to the visit, additional usage of staff PPE, and extensive cleaning of exam room while observing appropriate contact time as indicated for disinfecting solutions.      Objective:   Physical Exam AAOx3, NAD Obese- particularly around abdomen RRR, SEM w/ valvular click CTAB, no crackles or wheezes, normal work of breathing Abd soft, NT/ND, obese Pulses equal No LE edema     Assessment & Plan:

## 2021-08-20 NOTE — Patient Instructions (Signed)
Schedule your complete physical in 6 months We'll notify you of your lab results and make any changes if needed Hopefully we can get the justification that we need for weight loss medicine Continue to work on healthy diet (low carb) and regular exercise- you can do it! Call with any questions or concerns Stay Safe!  Stay Healthy! Happy Holidays!!

## 2021-08-20 NOTE — Assessment & Plan Note (Signed)
New.  Pt meets 4 of 5 criteria for metabolic syndrome and he may meet the 5th as well once labs are available for review.  Based on this, it would make sense to start a medication like Ozempic to improve glycemic control and also assist w/ weight loss.  Once we have available glucose and A1C readings, will attempt to prescribe for pt.  Pt expressed understanding and is in agreement w/ plan.

## 2021-08-20 NOTE — Assessment & Plan Note (Signed)
Chronic problem.  On Simvastatin 20mg daily w/o difficulty.  Check labs.  Adjust meds prn  

## 2021-08-21 ENCOUNTER — Telehealth: Payer: Self-pay

## 2021-08-21 ENCOUNTER — Other Ambulatory Visit: Payer: Self-pay | Admitting: Family Medicine

## 2021-08-21 MED ORDER — OZEMPIC (0.25 OR 0.5 MG/DOSE) 2 MG/1.5ML ~~LOC~~ SOPN: PEN_INJECTOR | SUBCUTANEOUS | 1 refills | Status: DC

## 2021-08-21 NOTE — Telephone Encounter (Signed)
Patient is aware of labs °

## 2021-08-21 NOTE — Telephone Encounter (Signed)
-----   Message from Sheliah Hatch, MD sent at 08/21/2021  7:32 AM EST ----- The labs show [place] just what we expected- low HDL, elevated triglycerides, and an A1C in the prediabetes range.  Thankfully no need for med changes but I'm going to try and prescribe the Ozempic or Mounjaro to help w/ weight loss.

## 2021-08-21 NOTE — Telephone Encounter (Signed)
PA submitted on covermymeds

## 2021-08-21 NOTE — Telephone Encounter (Signed)
Caller name:Treyshon Blankship   On DPR? :Yes  Call back number:[phone]  Provider they see: Beverely Low  Reason for call:Insurance is deny Semaglutide,0.25 or 0.5MG /DOS, (OZEMPIC, 0.25 OR 0.5 MG/DOSE,) 2 MG/1.5ML SOPN [[id number]]

## 2021-08-21 NOTE — Telephone Encounter (Signed)
Patient PA was started today . Per Tresa Endo

## 2021-08-22 ENCOUNTER — Ambulatory Visit (INDEPENDENT_AMBULATORY_CARE_PROVIDER_SITE_OTHER): Payer: 59 | Admitting: *Deleted

## 2021-08-22 ENCOUNTER — Other Ambulatory Visit: Payer: Self-pay

## 2021-08-22 DIAGNOSIS — Z952 Presence of prosthetic heart valve: Secondary | ICD-10-CM

## 2021-08-22 DIAGNOSIS — Z7901 Long term (current) use of anticoagulants: Secondary | ICD-10-CM

## 2021-08-22 DIAGNOSIS — I4891 Unspecified atrial fibrillation: Secondary | ICD-10-CM

## 2021-08-22 LAB — POCT INR: INR: 4.3 — AB (ref 2.0–3.0)

## 2021-08-22 NOTE — Patient Instructions (Signed)
Description   Continue taking warfarin 1 tablet daily except for 1/2 tablet on Fridays. Recheck INR in 5 weeks per pt request. Coumadin Clinic [phone]

## 2021-09-02 ENCOUNTER — Encounter: Payer: Self-pay | Admitting: Family Medicine

## 2021-09-05 ENCOUNTER — Other Ambulatory Visit: Payer: Self-pay | Admitting: Internal Medicine

## 2021-09-05 DIAGNOSIS — I4891 Unspecified atrial fibrillation: Secondary | ICD-10-CM

## 2021-09-05 DIAGNOSIS — Z952 Presence of prosthetic heart valve: Secondary | ICD-10-CM

## 2021-09-05 NOTE — Telephone Encounter (Signed)
Prescription refill request received for warfarin Lov: Bensimhon 08/05/2021 Next INR check: 1/31 Warfarin tablet strength: 10mg    Refill sent.

## 2021-09-08 ENCOUNTER — Ambulatory Visit (INDEPENDENT_AMBULATORY_CARE_PROVIDER_SITE_OTHER): Payer: 59

## 2021-09-08 ENCOUNTER — Encounter: Payer: Self-pay | Admitting: Internal Medicine

## 2021-09-08 DIAGNOSIS — I480 Paroxysmal atrial fibrillation: Secondary | ICD-10-CM

## 2021-09-08 LAB — CUP PACEART REMOTE DEVICE CHECK
Date Time Interrogation Session: 20230108230807
Date Time Interrogation Session: 20230108230807
Implantable Pulse Generator Implant Date: 20221031
Implantable Pulse Generator Implant Date: 20221031

## 2021-09-08 MED ORDER — WEGOVY 0.5 MG/0.5ML ~~LOC~~ SOAJ: 0.5000 mg | SUBCUTANEOUS | 0 refills | Status: DC

## 2021-09-08 MED ORDER — WEGOVY 0.25 MG/0.5ML ~~LOC~~ SOAJ: 0.2500 mg | SUBCUTANEOUS | 0 refills | Status: DC

## 2021-09-08 NOTE — Telephone Encounter (Signed)
Patient states he spoke with my pharmacist at [facility] they have Wegovy in stock in their warehouse. He would like to try the medication ans see if his insurance company covers it .would like to use [facility] as well.

## 2021-09-11 ENCOUNTER — Telehealth: Payer: Self-pay

## 2021-09-11 NOTE — Telephone Encounter (Signed)
Caller name:Dasiree Production assistant, radio)   On DPR? :Yes  Call back [place]  Provider they see: Birdie Riddle   Reason for call:Pt is wanting to be reconsidered for [employer] would like a copy of medical documentation saying he is type 2 diabetes. Pt has told this company he was. If unable to re consider can you send through another medication similar that his insurance will cover

## 2021-09-11 NOTE — Telephone Encounter (Signed)
Spoke to [employer] and they will not cover any weight loss medication if the dx is for weight loss. Any other advice going forward

## 2021-09-11 NOTE — Telephone Encounter (Signed)
Unfortunately, we tried to get the Ozempic approved using the Metabolic syndrome diagnosis but this was rejected.  Mark [name] is specifically for weight loss and not approved.  He should call his insurance and inquire whether any medication- Ozempic, Trulicity, Saxenda, Victoza- are covered for the Metabolic Syndrome dx rather than weight loss

## 2021-09-12 MED ORDER — TRULICITY 0.75 MG/0.5ML ~~LOC~~ SOAJ: 0.7500 mg | SUBCUTANEOUS | 1 refills | Status: DC

## 2021-09-12 NOTE — Telephone Encounter (Signed)
Spoke to patient and let him know his options and to see if he could get the Trulicity. Patient understood

## 2021-09-12 NOTE — Telephone Encounter (Signed)
I called pharmacy, they stated that the Truilcity and Victoza is on their formulary but it's a step therapy and would still need a PA. Was not able to tell me if it would be covered under the metabolic syndrome dx. At this time, it seems our only option is to send in either of the other medication or do an appeal for wegovy.

## 2021-09-12 NOTE — Telephone Encounter (Signed)
Sent Trulicity to pharmacy for pt.  Not sure if this will be approved.  This is our last option at this time unless he wants to call his insurance company and ask for an exception to the [place] or [employer]

## 2021-09-16 NOTE — Progress Notes (Signed)
Carelink Summary Report / Loop Recorder 

## 2021-09-22 ENCOUNTER — Telehealth: Payer: Self-pay

## 2021-09-22 NOTE — Telephone Encounter (Signed)
Patient called back asked patient if he symptomatic with pause at this date and time he said he knew had one but didn't pass out, patient stated that he just feels tired. Advised patient that if he was to have a syncopal episode before apt with Dr. Lalla Brothers on 10/07/21 he should go on to the emergency room patient voiced understanding

## 2021-09-22 NOTE — Telephone Encounter (Signed)
LVM for patient to call device clinic back regarding pause episode from 09/20/21 at [phone]

## 2021-09-28 ENCOUNTER — Other Ambulatory Visit: Payer: Self-pay | Admitting: Internal Medicine

## 2021-09-28 DIAGNOSIS — Z952 Presence of prosthetic heart valve: Secondary | ICD-10-CM

## 2021-09-28 DIAGNOSIS — I4891 Unspecified atrial fibrillation: Secondary | ICD-10-CM

## 2021-09-30 ENCOUNTER — Encounter: Payer: 59 | Admitting: Student

## 2021-10-06 NOTE — Progress Notes (Deleted)
Mechanical aortic valve in 1983 Persistent atrial fibrillation Prior ablations in August 2013 in March 2014. Loop recorder in place has shown increase in AF burden  Redo ablation Dofetilide

## 2021-10-07 ENCOUNTER — Ambulatory Visit (INDEPENDENT_AMBULATORY_CARE_PROVIDER_SITE_OTHER): Payer: 59 | Admitting: *Deleted

## 2021-10-07 ENCOUNTER — Encounter: Payer: Self-pay | Admitting: *Deleted

## 2021-10-07 ENCOUNTER — Ambulatory Visit (INDEPENDENT_AMBULATORY_CARE_PROVIDER_SITE_OTHER): Payer: 59 | Admitting: Cardiology

## 2021-10-07 ENCOUNTER — Encounter: Payer: Self-pay | Admitting: Cardiology

## 2021-10-07 ENCOUNTER — Other Ambulatory Visit: Payer: Self-pay

## 2021-10-07 VITALS — BP 104/72 | HR 74 | Ht 70.0 in | Wt 252.0 lb

## 2021-10-07 DIAGNOSIS — I4891 Unspecified atrial fibrillation: Secondary | ICD-10-CM

## 2021-10-07 DIAGNOSIS — Z7901 Long term (current) use of anticoagulants: Secondary | ICD-10-CM

## 2021-10-07 DIAGNOSIS — G4733 Obstructive sleep apnea (adult) (pediatric): Secondary | ICD-10-CM

## 2021-10-07 DIAGNOSIS — Z952 Presence of prosthetic heart valve: Secondary | ICD-10-CM

## 2021-10-07 DIAGNOSIS — I480 Paroxysmal atrial fibrillation: Secondary | ICD-10-CM | POA: Diagnosis not present

## 2021-10-07 DIAGNOSIS — I5032 Chronic diastolic (congestive) heart failure: Secondary | ICD-10-CM

## 2021-10-07 LAB — POCT INR: INR: 4.9 — AB (ref 2.0–3.0)

## 2021-10-07 NOTE — Patient Instructions (Addendum)
Medication Instructions:  Your physician recommends that you continue on your current medications as directed. Please refer to the Current Medication list given to you today. *If you need a refill on your cardiac medications before your next appointment, please call your pharmacy*  Lab Work: None. If you have labs (blood work) drawn today and your tests are completely normal, you will receive your results only by: [facility] (if you have MyChart) OR A paper copy in the mail If you have any lab test that is abnormal or we need to change your treatment, we will call you to review the results.  Testing/Procedures: Your physician has requested that you have cardiac CT. Cardiac computed tomography (CT) is a painless test that uses an x-ray machine to take clear, detailed pictures of your heart. For further information please visit [email or web address]. Please follow instruction sheet as given.  Your physician has recommended that you have an ablation. Catheter ablation is a medical procedure used to treat some cardiac arrhythmias (irregular heartbeats). During catheter ablation, a long, thin, flexible tube is put into a blood vessel in your groin (upper thigh), or neck. This tube is called an ablation catheter. It is then guided to your heart through the blood vessel. Radio frequency waves destroy small areas of heart tissue where abnormal heartbeats may cause an arrhythmia to start. Please see the instruction sheet given to you today.   Follow-Up: At [facility], you and your health needs are our priority.  As part of our continuing mission to provide you with exceptional heart care, we have created designated Provider Care Teams.  These Care Teams include your primary Cardiologist (physician) and Advanced Practice Providers (APPs -  Physician Assistants and Nurse Practitioners) who all work together to provide you with the care you need, when you need it.  Your physician wants you to  follow-up in: see instruction letter for Dr. Quentin Ore.  Please follow up with Dr. Radford Pax or Claiborne Billings for Sleep/Cpap  We recommend signing up for the patient portal called "MyChart".  Sign up information is provided on this After Visit Summary.  MyChart is used to connect with patients for Virtual Visits (Telemedicine).  Patients are able to view lab/test results, encounter notes, upcoming appointments, etc.  Non-urgent messages can be sent to your provider as well.   To learn more about what you can do with MyChart, go to [email or web address].    Any Other Special Instructions Will Be Listed Below (If Applicable).  Cardiac Ablation Cardiac ablation is a procedure to destroy (ablate) some heart tissue that is sending bad signals. These bad signals cause problems in heart rhythm. The heart has many areas that make these signals. If there are problems in these areas, they can make the heart beat in a way that is not normal. Destroying some tissues can help make the heart rhythm normal. Tell your doctor about: Any allergies you have. All medicines you are taking. These include vitamins, herbs, eye drops, creams, and over-the-counter medicines. Any problems you or family members have had with medicines that make you fall asleep (anesthetics). Any blood disorders you have. Any surgeries you have had. Any medical conditions you have, such as kidney failure. Whether you are pregnant or may be pregnant. What are the risks? This is a safe procedure. But problems may occur, including: Infection. Bruising and bleeding. Bleeding into the chest. Stroke or blood clots. Damage to nearby areas of your body. Allergies to medicines or dyes. The need for a pacemaker if the  normal system is damaged. Failure of the procedure to treat the problem. What happens before the procedure? Medicines Ask your doctor about: Changing or stopping your normal medicines. This is important. Taking aspirin and ibuprofen.  Do not take these medicines unless your doctor tells you to take them. Taking other medicines, vitamins, herbs, and supplements. General instructions Follow instructions from your doctor about what you cannot eat or drink. Plan to have someone take you home from the hospital or clinic. If you will be going home right after the procedure, plan to have someone with you for 24 hours. Ask your doctor what steps will be taken to prevent infection. What happens during the procedure?  An IV tube will be put into one of your veins. You will be given a medicine to help you relax. The skin on your neck or groin will be numbed. A cut (incision) will be made in your neck or groin. A needle will be put through your cut and into a large vein. A tube (catheter) will be put into the needle. The tube will be moved to your heart. Dye may be put through the tube. This helps your doctor see your heart. Small devices (electrodes) on the tube will send out signals. A type of energy will be used to destroy some heart tissue. The tube will be taken out. Pressure will be held on your cut. This helps stop bleeding. A bandage will be put over your cut. The exact procedure may vary among doctors and hospitals. What happens after the procedure? You will be watched until you leave the hospital or clinic. This includes checking your heart rate, breathing rate, oxygen, and blood pressure. Your cut will be watched for bleeding. You will need to lie still for a few hours. Do not drive for 24 hours or as long as your doctor tells you. Summary Cardiac ablation is a procedure to destroy some heart tissue. This is done to treat heart rhythm problems. Tell your doctor about any medical conditions you may have. Tell him or her about all medicines you are taking to treat them. This is a safe procedure. But problems may occur. These include infection, bruising, bleeding, and damage to nearby areas of your body. Follow what your  doctor tells you about food and drink. You may also be told to change or stop some of your medicines. After the procedure, do not drive for 24 hours or as long as your doctor tells you. This information is not intended to replace advice given to you by your health care provider. Make sure you discuss any questions you have with your health care provider. Document Revised: 07/20/2019 Document Reviewed: 07/20/2019 Elsevier Patient Education  2022 [employer].

## 2021-10-07 NOTE — Patient Instructions (Signed)
Description   Today take 1/2 tablet then continue taking warfarin 1 tablet daily except for 1/2 tablet on Fridays. Recheck INR in 4 weeks. Coumadin Clinic [phone]
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
## 2021-10-09 ENCOUNTER — Telehealth: Payer: Self-pay | Admitting: *Deleted

## 2021-10-09 NOTE — Addendum Note (Signed)
Addended by: Roney Mans A on: 10/09/2021 03:21 PM   Modules accepted: Orders

## 2021-10-09 NOTE — Telephone Encounter (Signed)
Received message that pt is pending an Ablation on 12/18/21; noted on next appt to ensure weekly checks are done leading up to appt.

## 2021-10-09 NOTE — Addendum Note (Signed)
Addended by: Roney Mans A on: 10/09/2021 03:23 PM   Modules accepted: Orders

## 2021-10-09 NOTE — Telephone Encounter (Signed)
-----   Message from Wiliam Ke, RN sent at 10/09/2021  3:25 PM EST ----- Regarding: afib ablation Pt is scheduled for an afib ablation on December 18, 2021 with Dr. Lalla Brothers.  He will need weekly INR's coming up on that time.  Thank you! Boneta Lucks RN

## 2021-10-11 LAB — CUP PACEART REMOTE DEVICE CHECK
Date Time Interrogation Session: 20230210230646
Implantable Pulse Generator Implant Date: 20221031

## 2021-10-13 ENCOUNTER — Ambulatory Visit (INDEPENDENT_AMBULATORY_CARE_PROVIDER_SITE_OTHER): Payer: 59

## 2021-10-13 DIAGNOSIS — I5032 Chronic diastolic (congestive) heart failure: Secondary | ICD-10-CM

## 2021-10-15 NOTE — Progress Notes (Signed)
Carelink Summary Report / Loop Recorder 

## 2021-10-16 ENCOUNTER — Other Ambulatory Visit: Payer: Self-pay | Admitting: Family Medicine

## 2021-10-28 ENCOUNTER — Other Ambulatory Visit: Payer: Self-pay | Admitting: Internal Medicine

## 2021-10-28 DIAGNOSIS — I4891 Unspecified atrial fibrillation: Secondary | ICD-10-CM

## 2021-10-28 DIAGNOSIS — Z952 Presence of prosthetic heart valve: Secondary | ICD-10-CM

## 2021-11-03 ENCOUNTER — Telehealth: Payer: Self-pay

## 2021-11-03 NOTE — Telephone Encounter (Signed)
Todays transmission reviewed. Presenting rhythm AF. May be ongoing since 11/01/21. Known history. On OAC for AF and mechanical valve. Patient feels better today than he did yesterday. ED precautions reviewed. Patient feels no additional interventions are needed at this time. "I was just wondering if I was in AF". Medications reviewed. Patient has not taken cardizem as he has not had any palpitations or feelings of AF other than some shortness of breath yesterday which has resolved today. Will continue to monitor. ? ? ? ? ? ? ?

## 2021-11-03 NOTE — Telephone Encounter (Signed)
Patient called in wanting to know if someone can look at his transmission to see if anything abnormal was seen. Patient states yesterday 11/02/2021 he was having sob and was concerned. Patient is not having any sob at the time. I have let patient know a nurse will give him a call back  ?

## 2021-11-17 ENCOUNTER — Ambulatory Visit (INDEPENDENT_AMBULATORY_CARE_PROVIDER_SITE_OTHER): Payer: 59

## 2021-11-17 DIAGNOSIS — I4891 Unspecified atrial fibrillation: Secondary | ICD-10-CM

## 2021-11-18 LAB — CUP PACEART REMOTE DEVICE CHECK
Date Time Interrogation Session: 20230321075332
Implantable Pulse Generator Implant Date: 20221031

## 2021-11-23 ENCOUNTER — Other Ambulatory Visit: Payer: Self-pay | Admitting: Internal Medicine

## 2021-11-23 DIAGNOSIS — I4891 Unspecified atrial fibrillation: Secondary | ICD-10-CM

## 2021-11-23 DIAGNOSIS — Z952 Presence of prosthetic heart valve: Secondary | ICD-10-CM

## 2021-11-24 ENCOUNTER — Ambulatory Visit (INDEPENDENT_AMBULATORY_CARE_PROVIDER_SITE_OTHER): Payer: 59

## 2021-11-24 ENCOUNTER — Other Ambulatory Visit: Payer: Self-pay

## 2021-11-24 ENCOUNTER — Other Ambulatory Visit: Payer: 59 | Admitting: *Deleted

## 2021-11-24 DIAGNOSIS — Z952 Presence of prosthetic heart valve: Secondary | ICD-10-CM | POA: Diagnosis not present

## 2021-11-24 DIAGNOSIS — I4891 Unspecified atrial fibrillation: Secondary | ICD-10-CM | POA: Diagnosis not present

## 2021-11-24 DIAGNOSIS — Z7901 Long term (current) use of anticoagulants: Secondary | ICD-10-CM

## 2021-11-24 DIAGNOSIS — G4733 Obstructive sleep apnea (adult) (pediatric): Secondary | ICD-10-CM

## 2021-11-24 DIAGNOSIS — I480 Paroxysmal atrial fibrillation: Secondary | ICD-10-CM

## 2021-11-24 LAB — BASIC METABOLIC PANEL
BUN/Creatinine Ratio: 16 (ref 9–20)
BUN: 20 mg/dL (ref 6–24)
CO2: 25 mmol/L (ref 20–29)
Calcium: 9.6 mg/dL (ref 8.7–10.2)
Chloride: 104 mmol/L (ref 96–106)
Creatinine, Ser: 1.27 mg/dL (ref 0.76–1.27)
Glucose: 102 mg/dL — ABNORMAL HIGH (ref 70–99)
Potassium: 4.9 mmol/L (ref 3.5–5.2)
Sodium: 137 mmol/L (ref 134–144)
eGFR: 65 mL/min/{1.73_m2} (ref >59)

## 2021-11-24 LAB — CBC WITH DIFFERENTIAL/PLATELET
Basophils Absolute: 0 10*3/uL (ref 0.0–0.2)
Basos: 0 %
EOS (ABSOLUTE): 0.1 10*3/uL (ref 0.0–0.4)
Eos: 2 %
Hematocrit: 39.1 % (ref 37.5–51.0)
Hemoglobin: 13.2 g/dL (ref 13.0–17.7)
Lymphocytes Absolute: 1.5 10*3/uL (ref 0.7–3.1)
Lymphs: 23 %
MCH: 30.8 pg (ref 26.6–33.0)
MCHC: 33.8 g/dL (ref 31.5–35.7)
MCV: 91 fL (ref 79–97)
Monocytes Absolute: 0.7 10*3/uL (ref 0.1–0.9)
Monocytes: 10 %
Neutrophils Absolute: 4.4 10*3/uL (ref 1.4–7.0)
Neutrophils: 65 %
Platelets: 171 10*3/uL (ref 150–450)
RBC: 4.29 x10E6/uL (ref 4.14–5.80)
RDW: 13.1 % (ref 11.6–15.4)
WBC: 6.8 10*3/uL (ref 3.4–10.8)

## 2021-11-24 LAB — POCT INR: INR: 3.5 — AB (ref 2.0–3.0)

## 2021-11-24 NOTE — Patient Instructions (Signed)
Description   ?Continue on same dosage of Warfarin 1 tablet daily except for 1/2 tablet on Fridays. Recheck INR in 1 week. [facility] [phone] ?  ?  ?

## 2021-11-25 NOTE — Progress Notes (Signed)
Carelink Summary Report / Loop Recorder 

## 2021-12-01 ENCOUNTER — Ambulatory Visit (INDEPENDENT_AMBULATORY_CARE_PROVIDER_SITE_OTHER): Payer: 59

## 2021-12-01 DIAGNOSIS — I4891 Unspecified atrial fibrillation: Secondary | ICD-10-CM

## 2021-12-01 DIAGNOSIS — Z952 Presence of prosthetic heart valve: Secondary | ICD-10-CM

## 2021-12-01 DIAGNOSIS — Z7901 Long term (current) use of anticoagulants: Secondary | ICD-10-CM | POA: Diagnosis not present

## 2021-12-01 LAB — POCT INR: INR: 4.3 — AB (ref 2.0–3.0)

## 2021-12-01 NOTE — Patient Instructions (Signed)
Description   ?Continue on same dosage of Warfarin 1 tablet daily except for 1/2 tablet on Fridays. Recheck INR in 1 week pending ablation. [facility] [phone] ?  ?  ?

## 2021-12-04 ENCOUNTER — Other Ambulatory Visit: Payer: Self-pay | Admitting: Surgery

## 2021-12-04 DIAGNOSIS — I7121 Aneurysm of the ascending aorta, without rupture: Secondary | ICD-10-CM

## 2021-12-08 ENCOUNTER — Ambulatory Visit (INDEPENDENT_AMBULATORY_CARE_PROVIDER_SITE_OTHER): Payer: 59

## 2021-12-08 DIAGNOSIS — Z5181 Encounter for therapeutic drug level monitoring: Secondary | ICD-10-CM | POA: Diagnosis not present

## 2021-12-08 DIAGNOSIS — I4891 Unspecified atrial fibrillation: Secondary | ICD-10-CM

## 2021-12-08 DIAGNOSIS — Z7901 Long term (current) use of anticoagulants: Secondary | ICD-10-CM | POA: Diagnosis not present

## 2021-12-08 DIAGNOSIS — Z952 Presence of prosthetic heart valve: Secondary | ICD-10-CM | POA: Diagnosis not present

## 2021-12-08 LAB — POCT INR: INR: 3.6 — AB (ref 2.0–3.0)

## 2021-12-08 NOTE — Patient Instructions (Signed)
Continue on same dosage of Warfarin 1 tablet daily except for 1/2 tablet on Fridays. Recheck INR in 1 week pending ablation. [facility] [phone] ?

## 2021-12-10 ENCOUNTER — Telehealth (HOSPITAL_COMMUNITY): Payer: Self-pay | Admitting: Emergency Medicine

## 2021-12-10 NOTE — Telephone Encounter (Signed)
Reaching out to patient to offer assistance regarding upcoming cardiac imaging study; pt verbalizes understanding of appt date/time, parking situation and where to check in, pre-test NPO status and medications ordered, and verified current allergies; name and call back number provided for further questions should they arise ?Rockwell Alexandria RN Navigator Cardiac Imaging ?[facility] ?[phone] office ?[phone] cell ? ?Difficult IV start - needed IV team last time ?Daily meds- holding spironolactone, lisinopril ?Arrival 730 ?

## 2021-12-11 ENCOUNTER — Ambulatory Visit (HOSPITAL_COMMUNITY)
Admission: RE | Admit: 2021-12-11 | Discharge: 2021-12-11 | Disposition: A | Discharge status: Home / Self Care | Payer: 59 | Source: Ambulatory Visit | Attending: Cardiology | Admitting: Cardiology

## 2021-12-11 DIAGNOSIS — I5032 Chronic diastolic (congestive) heart failure: Secondary | ICD-10-CM | POA: Diagnosis not present

## 2021-12-11 DIAGNOSIS — I480 Paroxysmal atrial fibrillation: Secondary | ICD-10-CM | POA: Insufficient documentation

## 2021-12-11 DIAGNOSIS — I7121 Aneurysm of the ascending aorta, without rupture: Secondary | ICD-10-CM | POA: Diagnosis present

## 2021-12-11 MED ORDER — NITROGLYCERIN 0.4 MG SL SUBL
SUBLINGUAL_TABLET | SUBLINGUAL | Status: AC
  Filled 2021-12-11: qty 2

## 2021-12-11 MED ORDER — IOHEXOL 350 MG/ML SOLN
100.0000 mL | Freq: Once | INTRAVENOUS | Status: AC | PRN
  Administered 2021-12-11: 100 mL via INTRAVENOUS

## 2021-12-11 MED ORDER — NITROGLYCERIN 0.4 MG SL SUBL
0.8000 mg | SUBLINGUAL_TABLET | Freq: Once | SUBLINGUAL | Status: AC
  Administered 2021-12-11: 0.8 mg via SUBLINGUAL

## 2021-12-15 ENCOUNTER — Ambulatory Visit (INDEPENDENT_AMBULATORY_CARE_PROVIDER_SITE_OTHER): Payer: 59 | Admitting: Pharmacist

## 2021-12-15 DIAGNOSIS — Z7901 Long term (current) use of anticoagulants: Secondary | ICD-10-CM | POA: Diagnosis not present

## 2021-12-15 DIAGNOSIS — I4891 Unspecified atrial fibrillation: Secondary | ICD-10-CM | POA: Diagnosis not present

## 2021-12-15 DIAGNOSIS — Z952 Presence of prosthetic heart valve: Secondary | ICD-10-CM

## 2021-12-15 LAB — POCT INR: INR: 2.1 (ref 2.0–3.0)

## 2021-12-15 NOTE — Patient Instructions (Signed)
Take 1.5 tablets tonight and tomorrow then continue on same dosage of Warfarin 1 tablet daily except for 1/2 tablet on Fridays. Recheck INR in 1.5 weeks. Ablation 4/20. [facility] [phone] ?

## 2021-12-16 NOTE — Pre-Procedure Instructions (Signed)
Instructed patient on the following items: ?Arrival time 0530 ?Nothing to eat or drink after midnight ?No meds AM of procedure ?Responsible person to drive you home and stay with you for 24 hrs ? ?Have you missed any doses of anti-coagulant Coumadin- hasn't missed any doses ?   ?

## 2021-12-18 ENCOUNTER — Ambulatory Visit (HOSPITAL_COMMUNITY)
Admission: RE | Admit: 2021-12-18 | Discharge: 2021-12-18 | Disposition: A | Discharge status: Home / Self Care | Payer: 59 | Attending: Cardiology | Admitting: Cardiology

## 2021-12-18 ENCOUNTER — Ambulatory Visit (HOSPITAL_COMMUNITY): Payer: 59 | Admitting: Certified Registered Nurse Anesthetist

## 2021-12-18 ENCOUNTER — Other Ambulatory Visit: Payer: Self-pay

## 2021-12-18 ENCOUNTER — Ambulatory Visit (HOSPITAL_COMMUNITY)
Admission: RE | Disposition: A | Discharge status: Home / Self Care | Payer: 59 | Source: Home / Self Care | Attending: Cardiology

## 2021-12-18 ENCOUNTER — Ambulatory Visit (HOSPITAL_BASED_OUTPATIENT_CLINIC_OR_DEPARTMENT_OTHER): Payer: 59 | Admitting: Certified Registered Nurse Anesthetist

## 2021-12-18 DIAGNOSIS — I480 Paroxysmal atrial fibrillation: Secondary | ICD-10-CM | POA: Diagnosis present

## 2021-12-18 DIAGNOSIS — I110 Hypertensive heart disease with heart failure: Secondary | ICD-10-CM

## 2021-12-18 DIAGNOSIS — G4733 Obstructive sleep apnea (adult) (pediatric): Secondary | ICD-10-CM | POA: Diagnosis not present

## 2021-12-18 DIAGNOSIS — I4891 Unspecified atrial fibrillation: Secondary | ICD-10-CM | POA: Diagnosis not present

## 2021-12-18 DIAGNOSIS — I2510 Atherosclerotic heart disease of native coronary artery without angina pectoris: Secondary | ICD-10-CM | POA: Diagnosis not present

## 2021-12-18 DIAGNOSIS — I4819 Other persistent atrial fibrillation: Secondary | ICD-10-CM | POA: Diagnosis not present

## 2021-12-18 DIAGNOSIS — I509 Heart failure, unspecified: Secondary | ICD-10-CM | POA: Diagnosis not present

## 2021-12-18 DIAGNOSIS — Z952 Presence of prosthetic heart valve: Secondary | ICD-10-CM | POA: Diagnosis not present

## 2021-12-18 HISTORY — PX: ATRIAL FIBRILLATION ABLATION: EP1191

## 2021-12-18 LAB — POCT ACTIVATED CLOTTING TIME
Activated Clotting Time: 287 seconds
Activated Clotting Time: 293 seconds
Activated Clotting Time: 317 seconds

## 2021-12-18 LAB — PROTIME-INR
INR: 2.8 — ABNORMAL HIGH (ref 0.8–1.2)
Prothrombin Time: 29.3 seconds — ABNORMAL HIGH (ref 11.4–15.2)

## 2021-12-18 SURGERY — ATRIAL FIBRILLATION ABLATION
Anesthesia: General

## 2021-12-18 MED ORDER — COLCHICINE 0.6 MG PO TABS: 0.6000 mg | ORAL_TABLET | Freq: Two times a day (BID) | ORAL | 0 refills | Status: DC

## 2021-12-18 MED ORDER — FENTANYL CITRATE (PF) 100 MCG/2ML IJ SOLN
INTRAMUSCULAR | Status: DC | PRN
Start: 2021-12-18 — End: 2021-12-18
  Administered 2021-12-18 (×2): 50 ug via INTRAVENOUS

## 2021-12-18 MED ORDER — HEPARIN SODIUM (PORCINE) 1000 UNIT/ML IJ SOLN
INTRAMUSCULAR | Status: AC
  Filled 2021-12-18: qty 10

## 2021-12-18 MED ORDER — PROPOFOL 10 MG/ML IV BOLUS
INTRAVENOUS | Status: DC | PRN
Start: 2021-12-18 — End: 2021-12-18
  Administered 2021-12-18: 100 mg via INTRAVENOUS

## 2021-12-18 MED ORDER — EPHEDRINE SULFATE-NACL 50-0.9 MG/10ML-% IV SOSY
PREFILLED_SYRINGE | INTRAVENOUS | Status: DC | PRN
  Administered 2021-12-18: 10 mg via INTRAVENOUS

## 2021-12-18 MED ORDER — PHENYLEPHRINE 80 MCG/ML (10ML) SYRINGE FOR IV PUSH (FOR BLOOD PRESSURE SUPPORT)
PREFILLED_SYRINGE | INTRAVENOUS | Status: DC | PRN
  Administered 2021-12-18 (×3): 80 ug via INTRAVENOUS

## 2021-12-18 MED ORDER — PROTAMINE SULFATE 10 MG/ML IV SOLN
INTRAVENOUS | Status: DC | PRN
  Administered 2021-12-18: 30 mg via INTRAVENOUS

## 2021-12-18 MED ORDER — COLCHICINE 0.6 MG PO TABS
0.6000 mg | ORAL_TABLET | Freq: Two times a day (BID) | ORAL | Status: DC
  Administered 2021-12-18: 0.6 mg via ORAL
  Filled 2021-12-18: qty 1

## 2021-12-18 MED ORDER — HEPARIN SODIUM (PORCINE) 1000 UNIT/ML IJ SOLN
INTRAMUSCULAR | Status: DC | PRN
  Administered 2021-12-18: 1000 [IU] via INTRAVENOUS

## 2021-12-18 MED ORDER — MIDAZOLAM HCL 2 MG/2ML IJ SOLN
INTRAMUSCULAR | Status: DC | PRN
  Administered 2021-12-18: 2 mg via INTRAVENOUS

## 2021-12-18 MED ORDER — HEPARIN (PORCINE) IN NACL 1000-0.9 UT/500ML-% IV SOLN
INTRAVENOUS | Status: AC
  Filled 2021-12-18: qty 500

## 2021-12-18 MED ORDER — PANTOPRAZOLE SODIUM 40 MG PO TBEC
40.0000 mg | DELAYED_RELEASE_TABLET | Freq: Every day | ORAL | Status: DC
Start: 2021-12-18 — End: 2021-12-18
  Administered 2021-12-18: 40 mg via ORAL
  Filled 2021-12-18: qty 1

## 2021-12-18 MED ORDER — ROCURONIUM BROMIDE 10 MG/ML (PF) SYRINGE
PREFILLED_SYRINGE | INTRAVENOUS | Status: DC | PRN
Start: 2021-12-18 — End: 2021-12-18
  Administered 2021-12-18: 100 mg via INTRAVENOUS

## 2021-12-18 MED ORDER — HEPARIN (PORCINE) IN NACL 1000-0.9 UT/500ML-% IV SOLN
INTRAVENOUS | Status: AC
Start: 2021-12-18 — End: ?
  Filled 2021-12-18: qty 500

## 2021-12-18 MED ORDER — PHENYLEPHRINE HCL-NACL 20-0.9 MG/250ML-% IV SOLN
INTRAVENOUS | Status: DC | PRN
  Administered 2021-12-18: 40 ug/min via INTRAVENOUS

## 2021-12-18 MED ORDER — ONDANSETRON HCL 4 MG/2ML IJ SOLN: 4.0000 mg | Freq: Four times a day (QID) | INTRAMUSCULAR | Status: DC | PRN

## 2021-12-18 MED ORDER — SODIUM CHLORIDE 0.9 % IV SOLN: 250.0000 mL | INTRAVENOUS | Status: DC | PRN

## 2021-12-18 MED ORDER — PANTOPRAZOLE SODIUM 40 MG PO TBEC: 40.0000 mg | DELAYED_RELEASE_TABLET | Freq: Every day | ORAL | 0 refills | Status: DC

## 2021-12-18 MED ORDER — SODIUM CHLORIDE 0.9% FLUSH: 3.0000 mL | INTRAVENOUS | Status: DC | PRN

## 2021-12-18 MED ORDER — HEPARIN SODIUM (PORCINE) 1000 UNIT/ML IJ SOLN
INTRAMUSCULAR | Status: DC | PRN
  Administered 2021-12-18: 12000 [IU] via INTRAVENOUS
  Administered 2021-12-18: 2000 [IU] via INTRAVENOUS
  Administered 2021-12-18: 4000 [IU] via INTRAVENOUS
  Administered 2021-12-18: 2000 [IU] via INTRAVENOUS

## 2021-12-18 MED ORDER — CEFAZOLIN SODIUM-DEXTROSE 2-3 GM-%(50ML) IV SOLR
INTRAVENOUS | Status: DC | PRN
  Administered 2021-12-18: 2 g via INTRAVENOUS

## 2021-12-18 MED ORDER — ALBUMIN HUMAN 5 % IV SOLN: INTRAVENOUS | Status: DC | PRN

## 2021-12-18 MED ORDER — ACETAMINOPHEN 325 MG PO TABS
650.0000 mg | ORAL_TABLET | Freq: Four times a day (QID) | ORAL | Status: DC | PRN
  Administered 2021-12-18: 650 mg via ORAL
  Filled 2021-12-18: qty 2

## 2021-12-18 MED ORDER — SODIUM CHLORIDE 0.9 % IV SOLN: INTRAVENOUS | Status: DC

## 2021-12-18 MED ORDER — DEXAMETHASONE SODIUM PHOSPHATE 10 MG/ML IJ SOLN
INTRAMUSCULAR | Status: DC | PRN
  Administered 2021-12-18: 10 mg via INTRAVENOUS

## 2021-12-18 MED ORDER — HEPARIN (PORCINE) IN NACL 1000-0.9 UT/500ML-% IV SOLN
INTRAVENOUS | Status: DC | PRN
  Administered 2021-12-18 (×4): 500 mL

## 2021-12-18 MED ORDER — SUGAMMADEX SODIUM 200 MG/2ML IV SOLN
INTRAVENOUS | Status: DC | PRN
  Administered 2021-12-18: 200 mg via INTRAVENOUS

## 2021-12-18 MED ORDER — SODIUM CHLORIDE 0.9% FLUSH: 3.0000 mL | Freq: Two times a day (BID) | INTRAVENOUS | Status: DC

## 2021-12-18 MED ORDER — CEFAZOLIN SODIUM-DEXTROSE 2-4 GM/100ML-% IV SOLN
INTRAVENOUS | Status: AC
  Filled 2021-12-18: qty 100

## 2021-12-18 MED ORDER — ONDANSETRON HCL 4 MG/2ML IJ SOLN
INTRAMUSCULAR | Status: DC | PRN
  Administered 2021-12-18: 4 mg via INTRAVENOUS

## 2021-12-18 MED ORDER — LIDOCAINE 2% (20 MG/ML) 5 ML SYRINGE
INTRAMUSCULAR | Status: DC | PRN
  Administered 2021-12-18: 100 mg via INTRAVENOUS

## 2021-12-18 SURGICAL SUPPLY — 20 items
BLANKET WARM UNDERBOD FULL ACC (MISCELLANEOUS) ×2 IMPLANT
CATH 8FR REPROCESSED SOUNDSTAR (CATHETERS) ×2 IMPLANT
CATH 8FR SOUNDSTAR REPROCESSED (CATHETERS) IMPLANT
CATH OCTARAY 2.0 F 3-3-3-3-3 (CATHETERS) ×1 IMPLANT
CATH S CIRCA THERM PROBE 10F (CATHETERS) ×1 IMPLANT
CATH SMTCH THERMOCOOL SF DF (CATHETERS) ×1 IMPLANT
CATH WEB BI DIR CSDF CRV REPRO (CATHETERS) ×1 IMPLANT
CLOSURE PERCLOSE PROSTYLE (VASCULAR PRODUCTS) ×3 IMPLANT
COVER SWIFTLINK CONNECTOR (BAG) ×2 IMPLANT
MAT PREVALON FULL STRYKER (MISCELLANEOUS) ×1 IMPLANT
PACK EP LATEX FREE (CUSTOM PROCEDURE TRAY) ×2
PACK EP LF (CUSTOM PROCEDURE TRAY) ×1 IMPLANT
PAD DEFIB RADIO PHYSIO CONN (PAD) ×2 IMPLANT
PATCH CARTO3 (PAD) ×1 IMPLANT
SHEATH BAYLIS TRANSSEPTAL 98CM (NEEDLE) ×1 IMPLANT
SHEATH CARTO VIZIGO SM CVD (SHEATH) ×1 IMPLANT
SHEATH PINNACLE 8F 10CM (SHEATH) ×2 IMPLANT
SHEATH PINNACLE 9F 10CM (SHEATH) ×1 IMPLANT
SHEATH PROBE COVER 6X72 (BAG) ×1 IMPLANT
TUBING SMART ABLATE COOLFLOW (TUBING) ×1 IMPLANT

## 2021-12-18 NOTE — Discharge Instructions (Signed)
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
## 2021-12-18 NOTE — Anesthesia Preprocedure Evaluation (Addendum)
Anesthesia Evaluation  ? ?Patient awake ? ? ? ?Reviewed: ?Allergy & Precautions, NPO status , Patient's Chart, lab work & pertinent test results ? ?History of Anesthesia Complications ?Negative for: history of anesthetic complications ? ?Airway ?Mallampati: III ? ?TM Distance: >3 FB ?Neck ROM: Limited ? ? ? Dental ? ?(+) Teeth Intact, Dental Advisory Given,  ?  ?Pulmonary ?sleep apnea ,  ?  ?breath sounds clear to auscultation ? ? ? ? ? ? Cardiovascular ?hypertension, Pt. on home beta blockers and Pt. on medications ?+ CAD, + Peripheral Vascular Disease and +CHF  ?+ dysrhythmias Atrial Fibrillation + Valvular Problems/Murmurs AS  ?Rhythm:Irregular + Systolic Click ?1. Mild septal hypokinesis consistent with postoperative state. Left  ?ventricular ejection fraction, by estimation, is 55 to 60%. The left  ?ventricle has normal function. The left ventricle has no regional wall  ?motion abnormalities. There is mild  ?concentric left ventricular hypertrophy. Left ventricular diastolic  ?parameters are indeterminate.  ??2. Right ventricular systolic function is normal. The right ventricular  ?size is normal. There is normal pulmonary artery systolic pressure.  ??3. The mitral valve is normal in structure. Trivial mitral valve  ?regurgitation. No evidence of mitral stenosis.  ??4. Mechanical aortic valve. Mean gradient 30.6 mmHg now compared with 29  ?mmHg 10/2019. Dimensionless index is 0.4, however the deceleration time is  ?78 ms, making pathologic obstruction less likely. The aortic valve has  ?been repaired/replaced. Aortic  ?valve regurgitation is trivial. Moderate aortic valve stenosis. There is a  ?unknown St. Jude mechanical valve present in the aortic position.  ?Procedure Date: 108. Aortic valve area, by VTI measures 1.82 cm?Marland Kitchen Aortic  ?valve mean gradient measures 30.6 mmHg.  ??Aortic valve Vmax measures 3.71 m/s.  ??5. Aortic dilatation noted. There is severe dilatation of the  ascending  ?aorta, measuring 50 mm.  ??6. The inferior vena cava is normal in size with greater than 50%  ?respiratory variability, suggesting right atrial pressure of 3 mmHg.  ? ?Comparison(s): Compared with the echo 10/2019, mechanical aortic valve  ?gradients and ascending aorta aneurysm are essentially unchanged.  ? ? ? ? ?  ?Neuro/Psych ? Headaches, Right upper extremity numbness intermittent and with neck extension per patient ?negative psych ROS  ? GI/Hepatic ?negative GI ROS, Neg liver ROS,   ?Endo/Other  ? ? Renal/GU ?negative Renal ROS  ? ?  ?Musculoskeletal ?negative musculoskeletal ROS ?(+)  ? Abdominal ?  ?Peds ? Hematology ? ?(+) Blood dyscrasia, , Coumadin ?Lab Results ?     Component                Value               Date                 ?     INR                      2.8 (H)             12/18/2021           ?     INR                      2.1                 12/15/2021           ?     INR  3.6 (A)             12/08/2021           ?     PROTIME                  18.5                01/25/2009           ?   ?Anesthesia Other Findings ? ? Reproductive/Obstetrics ? ?  ? ? ? ? ? ? ? ? ? ? ? ? ? ?  ?  ? ? ? ? ? ? ? ?Anesthesia Physical ?Anesthesia Plan ? ?ASA: 3 ? ?Anesthesia Plan: General  ? ?Post-op Pain Management: Minimal or no pain anticipated  ? ?Induction: Intravenous ? ?PONV Risk Score and Plan: 2 and Ondansetron and Dexamethasone ? ?Airway Management Planned: Oral ETT and Video Laryngoscope Planned ? ?Additional Equipment: None ? ?Intra-op Plan:  ? ?Post-operative Plan: Extubation in OR ? ?Informed Consent: I have reviewed the patients History and Physical, chart, labs and discussed the procedure including the risks, benefits and alternatives for the proposed anesthesia with the patient or authorized representative who has indicated his/her understanding and acceptance.  ? ? ? ?Dental advisory given ? ?Plan Discussed with: CRNA ? ?Anesthesia Plan Comments:    ? ? ? ? ? ?Anesthesia Quick Evaluation ? ?

## 2021-12-18 NOTE — Transfer of Care (Signed)
Immediate Anesthesia Transfer of Care Note ? ?Patient: Mark [name] ? ?Procedure(s) Performed: ATRIAL FIBRILLATION ABLATION ? ?Patient Location: Cath Lab ? ?Anesthesia Type:General ? ?Level of Consciousness: awake, alert  and oriented ? ?Airway & Oxygen Therapy: Patient Spontanous Breathing and Patient connected to nasal cannula oxygen ? ?Post-op Assessment: Report given to RN and Post -op Vital signs reviewed and stable ? ?Post vital signs: Reviewed and stable ? ?Last Vitals:  ?Vitals Value Taken Time  ?BP 85/67 12/18/21 1026  ?Temp    ?Pulse 62 12/18/21 1028  ?Resp 16 12/18/21 1028  ?SpO2 94 % 12/18/21 1028  ?Vitals shown include unvalidated device data. ? ?Last Pain:  ?Vitals:  ? 12/18/21 1007  ?TempSrc:   ?PainSc: 0-No pain  ?   ? ?  ? ?Complications: No notable events documented. ?

## 2021-12-18 NOTE — Anesthesia Procedure Notes (Signed)
Procedure Name: Intubation ?Date/Time: 12/18/2021 7:54 AM ?Performed by: Genelle Bal, CRNA ?Pre-anesthesia Checklist: Patient identified, Emergency Drugs available, Suction available and Patient being monitored ?Patient Re-evaluated:Patient Re-evaluated prior to induction ?Oxygen Delivery Method: Circle system utilized ?Preoxygenation: Pre-oxygenation with 100% oxygen ?Induction Type: IV induction ?Ventilation: Mask ventilation without difficulty and Oral airway inserted - appropriate to patient size ?Laryngoscope Size: Glidescope and 4 ?Grade View: Grade I ?Tube type: Oral ?Tube size: 7.5 mm ?Number of attempts: 1 ?Airway Equipment and Method: Stylet and Oral airway ?Placement Confirmation: ETT inserted through vocal cords under direct vision, positive ETCO2 and breath sounds checked- equal and bilateral ?Tube secured with: Tape ?Dental Injury: Teeth and Oropharynx as per pre-operative assessment  ?Difficulty Due To: Difficult Airway- due to reduced neck mobility ?Comments: Pt complains of intermittent R arm/hand tingling/numbness. Limited neck extension. Elective Glidescope intubation.  ? ? ? ? ?

## 2021-12-19 ENCOUNTER — Encounter (HOSPITAL_COMMUNITY): Payer: Self-pay | Admitting: Cardiology

## 2021-12-19 MED FILL — Cefazolin Sodium-Dextrose IV Solution 2 GM/100ML-4%: INTRAVENOUS | Qty: 100 | Status: AC

## 2021-12-20 ENCOUNTER — Other Ambulatory Visit: Payer: Self-pay | Admitting: Internal Medicine

## 2021-12-20 DIAGNOSIS — Z952 Presence of prosthetic heart valve: Secondary | ICD-10-CM

## 2021-12-20 DIAGNOSIS — I4891 Unspecified atrial fibrillation: Secondary | ICD-10-CM

## 2021-12-21 NOTE — Anesthesia Postprocedure Evaluation (Signed)
Anesthesia Post Note ? ?Patient: Mark [name] ? ?Procedure(s) Performed: ATRIAL FIBRILLATION ABLATION ? ?  ? ?Patient location during evaluation: Cath Lab ?Anesthesia Type: General ?Level of consciousness: patient cooperative and awake ?Pain management: pain level controlled ?Vital Signs Assessment: post-procedure vital signs reviewed and stable ?Respiratory status: spontaneous breathing, nonlabored ventilation, respiratory function stable and patient connected to nasal cannula oxygen ?Cardiovascular status: blood pressure returned to baseline and stable ?Postop Assessment: no apparent nausea or vomiting ?Anesthetic complications: no ? ? ?No notable events documented. ? ?Last Vitals:  ?Vitals:  ? 12/18/21 1420 12/18/21 1430  ?BP:    ?Pulse: 64 69  ?Resp: 14 15  ?Temp:    ?SpO2: 93% 93%  ?  ?Last Pain:  ?Vitals:  ? 12/19/21 1624  ?TempSrc:   ?PainSc: 0-No pain  ? ? ?  ?  ?  ?  ?  ?  ? ?Alaysia Lightle ? ? ? ? ?

## 2021-12-22 ENCOUNTER — Ambulatory Visit (INDEPENDENT_AMBULATORY_CARE_PROVIDER_SITE_OTHER): Payer: 59

## 2021-12-22 DIAGNOSIS — I4891 Unspecified atrial fibrillation: Secondary | ICD-10-CM | POA: Diagnosis not present

## 2021-12-23 ENCOUNTER — Telehealth (HOSPITAL_COMMUNITY): Payer: Self-pay | Admitting: *Deleted

## 2021-12-23 LAB — CUP PACEART REMOTE DEVICE CHECK
Date Time Interrogation Session: 20230425073326
Implantable Pulse Generator Implant Date: 20221031

## 2021-12-23 NOTE — Telephone Encounter (Signed)
Pt left vm requesting return call. I called pt back and left vm for return call.  ?

## 2021-12-24 ENCOUNTER — Ambulatory Visit (INDEPENDENT_AMBULATORY_CARE_PROVIDER_SITE_OTHER): Payer: 59 | Admitting: *Deleted

## 2021-12-24 DIAGNOSIS — I4891 Unspecified atrial fibrillation: Secondary | ICD-10-CM

## 2021-12-24 DIAGNOSIS — Z7901 Long term (current) use of anticoagulants: Secondary | ICD-10-CM | POA: Diagnosis not present

## 2021-12-24 DIAGNOSIS — Z952 Presence of prosthetic heart valve: Secondary | ICD-10-CM

## 2021-12-24 LAB — POCT INR: INR: 3.7 — AB (ref 2.0–3.0)

## 2021-12-24 NOTE — Patient Instructions (Signed)
Description   ?Continue taking Warfarin 1 tablet daily except for 1/2 tablet on Fridays. Recheck INR in 3 weeks. Ablation 4/20. Coumadin Clinic [phone] ?  ?  ?

## 2021-12-31 ENCOUNTER — Other Ambulatory Visit (HOSPITAL_COMMUNITY): Payer: Self-pay | Admitting: Internal Medicine

## 2022-01-06 NOTE — Progress Notes (Signed)
Carelink Summary Report / Loop Recorder 

## 2022-01-09 ENCOUNTER — Other Ambulatory Visit: Payer: Self-pay | Admitting: Student

## 2022-01-13 ENCOUNTER — Ambulatory Visit (INDEPENDENT_AMBULATORY_CARE_PROVIDER_SITE_OTHER): Payer: 59 | Admitting: *Deleted

## 2022-01-13 DIAGNOSIS — Z952 Presence of prosthetic heart valve: Secondary | ICD-10-CM

## 2022-01-13 DIAGNOSIS — I4891 Unspecified atrial fibrillation: Secondary | ICD-10-CM

## 2022-01-13 DIAGNOSIS — Z7901 Long term (current) use of anticoagulants: Secondary | ICD-10-CM

## 2022-01-13 LAB — POCT INR: INR: 3.5 — AB (ref 2.0–3.0)

## 2022-01-13 NOTE — Patient Instructions (Signed)
Description   ?Continue taking Warfarin 1 tablet daily except for 1/2 tablet on Fridays. Recheck INR in 4 weeks. Coumadin Clinic [phone] ?  ?  ?

## 2022-01-14 ENCOUNTER — Other Ambulatory Visit: Payer: Self-pay | Admitting: Family Medicine

## 2022-01-14 ENCOUNTER — Ambulatory Visit: Payer: 59 | Admitting: Surgery

## 2022-01-14 ENCOUNTER — Encounter: Payer: Self-pay | Admitting: Surgery

## 2022-01-14 VITALS — BP 150/100 | HR 76 | Resp 20 | Ht 70.0 in | Wt 250.0 lb

## 2022-01-14 DIAGNOSIS — I7121 Aneurysm of the ascending aorta, without rupture: Secondary | ICD-10-CM
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
## 2022-01-15 ENCOUNTER — Ambulatory Visit (HOSPITAL_COMMUNITY)
Admission: RE | Admit: 2022-01-15 | Discharge: 2022-01-15 | Disposition: A | Discharge status: Home / Self Care | Payer: 59 | Source: Ambulatory Visit | Attending: Nurse Practitioner | Admitting: Nurse Practitioner

## 2022-01-15 ENCOUNTER — Encounter (HOSPITAL_COMMUNITY): Payer: Self-pay | Admitting: Nurse Practitioner

## 2022-01-15 VITALS — BP 134/78 | HR 56 | Ht 70.0 in | Wt 260.0 lb

## 2022-01-15 DIAGNOSIS — D6869 Other thrombophilia: Secondary | ICD-10-CM

## 2022-01-15 DIAGNOSIS — I4891 Unspecified atrial fibrillation: Secondary | ICD-10-CM | POA: Diagnosis not present

## 2022-01-15 DIAGNOSIS — G4733 Obstructive sleep apnea (adult) (pediatric): Secondary | ICD-10-CM | POA: Diagnosis not present

## 2022-01-15 DIAGNOSIS — Z7901 Long term (current) use of anticoagulants: Secondary | ICD-10-CM | POA: Diagnosis not present

## 2022-01-15 DIAGNOSIS — I10 Essential (primary) hypertension: Secondary | ICD-10-CM | POA: Diagnosis not present

## 2022-01-15 DIAGNOSIS — I493 Ventricular premature depolarization: Secondary | ICD-10-CM

## 2022-01-15 DIAGNOSIS — I4819 Other persistent atrial fibrillation: Secondary | ICD-10-CM | POA: Diagnosis not present

## 2022-01-15 NOTE — Patient Instructions (Addendum)
Cardioversion scheduled for Wednesday, June 7th  - Arrive at the [facility] and go to admitting at [place] not eat or drink anything after midnight the night prior to your procedure.  - Take all your morning medication (except diabetic medications) with a sip of water prior to arrival.  - You will not be able to drive home after your procedure.  - Do NOT miss any doses of your blood thinner - if you should miss a dose please notify our office immediately.  - If you feel as if you go back into normal rhythm prior to scheduled cardioversion, please notify our office immediately. If your procedure is canceled in the cardioversion suite you will be charged a cancellation fee.    Coumadin clinic will contact to schedule weekly INR checks until day of cardioversion  Stop by lab at [facility] on 5/31 once INR completed.
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
## 2022-01-20 ENCOUNTER — Other Ambulatory Visit: Payer: Self-pay | Admitting: Internal Medicine

## 2022-01-20 DIAGNOSIS — I4891 Unspecified atrial fibrillation: Secondary | ICD-10-CM

## 2022-01-20 DIAGNOSIS — Z952 Presence of prosthetic heart valve: Secondary | ICD-10-CM

## 2022-01-23 ENCOUNTER — Ambulatory Visit (INDEPENDENT_AMBULATORY_CARE_PROVIDER_SITE_OTHER): Payer: 59 | Admitting: *Deleted

## 2022-01-23 DIAGNOSIS — I4891 Unspecified atrial fibrillation: Secondary | ICD-10-CM | POA: Diagnosis not present

## 2022-01-23 DIAGNOSIS — Z5181 Encounter for therapeutic drug level monitoring: Secondary | ICD-10-CM

## 2022-01-23 DIAGNOSIS — Z952 Presence of prosthetic heart valve: Secondary | ICD-10-CM | POA: Diagnosis not present

## 2022-01-23 LAB — POCT INR: INR: 3.8 — AB (ref 2.0–3.0)

## 2022-01-23 NOTE — Patient Instructions (Signed)
Description   Continue taking Warfarin 1 tablet daily except for 1/2 tablet on Fridays. Recheck INR in 1 week. (Dccv on 6/7) Coumadin Clinic [phone]

## 2022-01-27 ENCOUNTER — Ambulatory Visit: Payer: 59

## 2022-01-28 ENCOUNTER — Encounter (HOSPITAL_COMMUNITY): Payer: Self-pay | Admitting: Cardiovascular Disease

## 2022-01-28 ENCOUNTER — Telehealth: Payer: Self-pay

## 2022-01-28 NOTE — Telephone Encounter (Signed)
I spoke with the patient and [employer] was going to try to interrogate his device at 4pm at his coumadin appointment but coumadin is going to see him right after his sleep study appointment at 2:40 pm. I message [place] [employer] rep and see what time they can see the patient.

## 2022-01-29 ENCOUNTER — Ambulatory Visit: Payer: 59

## 2022-01-29 ENCOUNTER — Ambulatory Visit (INDEPENDENT_AMBULATORY_CARE_PROVIDER_SITE_OTHER): Payer: 59 | Admitting: Cardiology

## 2022-01-29 ENCOUNTER — Encounter: Payer: Self-pay | Admitting: Cardiology

## 2022-01-29 ENCOUNTER — Ambulatory Visit (INDEPENDENT_AMBULATORY_CARE_PROVIDER_SITE_OTHER): Payer: 59

## 2022-01-29 VITALS — BP 112/68 | HR 48 | Ht 70.0 in | Wt 260.0 lb

## 2022-01-29 DIAGNOSIS — I4891 Unspecified atrial fibrillation: Secondary | ICD-10-CM

## 2022-01-29 DIAGNOSIS — Z952 Presence of prosthetic heart valve: Secondary | ICD-10-CM | POA: Diagnosis not present

## 2022-01-29 DIAGNOSIS — G4733 Obstructive sleep apnea (adult) (pediatric): Secondary | ICD-10-CM

## 2022-01-29 DIAGNOSIS — I10 Essential (primary) hypertension: Secondary | ICD-10-CM | POA: Diagnosis not present

## 2022-01-29 DIAGNOSIS — Z7901 Long term (current) use of anticoagulants: Secondary | ICD-10-CM

## 2022-01-29 LAB — POCT INR: INR: 4 — AB (ref 2.0–3.0)

## 2022-01-29 NOTE — Patient Instructions (Signed)
Description   Continue taking Warfarin 1 tablet daily except for 1/2 tablet on Fridays.  Recheck INR in 1 week. (DCCV on 6/7)  Coumadin Clinic [phone]

## 2022-01-29 NOTE — Telephone Encounter (Signed)
I spoke with the patient and he is going to be seen by [employer] at 4 pm.

## 2022-01-29 NOTE — Patient Instructions (Signed)
Medication Instructions:  ?Your physician recommends that you continue on your current medications as directed. Please refer to the Current Medication list given to you today. ? ?*If you need a refill on your cardiac medications before your next appointment, please call your pharmacy* ? ?Testing/Procedures: ?Your physician has recommended that you have a sleep study. This test records several body functions during sleep, including: brain activity, eye movement, oxygen and carbon dioxide blood levels, heart rate and rhythm, breathing rate and rhythm, the flow of air through your mouth and nose, snoring, body muscle movements, and chest and belly movement. ? ?Follow-Up: ?At [facility], you and your health needs are our priority.  As part of our continuing mission to provide you with exceptional heart care, we have created designated Provider Care Teams.  These Care Teams include your primary Cardiologist (physician) and Advanced Practice Providers (APPs -  Physician Assistants and Nurse Practitioners) who all work together to provide you with the care you need, when you need it. ? ?Follow up with Dr. Turner based on results of your sleep study.  ? ? ?Important Information About Sugar ? ? ? ? ?  ?

## 2022-01-29 NOTE — Progress Notes (Signed)
Patient seen by industry for troubleshooting "mechanical reset" alert.
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
## 2022-01-30 NOTE — Telephone Encounter (Signed)
The patient app is now working. I will check and make sure the pt app is working on 6/5-01/2022. We need transmission to see if patient is in normal sinus rhythm. If he is he will not need a cardioversion. Let Inetta Fermo know as well if the patient is back in rhythm.

## 2022-02-02 ENCOUNTER — Telehealth (HOSPITAL_COMMUNITY): Payer: Self-pay | Admitting: *Deleted

## 2022-02-02 ENCOUNTER — Encounter: Payer: Self-pay | Admitting: Family Medicine

## 2022-02-02 ENCOUNTER — Ambulatory Visit (INDEPENDENT_AMBULATORY_CARE_PROVIDER_SITE_OTHER): Payer: 59 | Admitting: Family Medicine

## 2022-02-02 VITALS — BP 122/76 | HR 68 | Temp 98.1°F | Resp 17 | Ht 70.0 in | Wt 255.0 lb

## 2022-02-02 DIAGNOSIS — R059 Cough, unspecified: Secondary | ICD-10-CM | POA: Diagnosis not present

## 2022-02-02 LAB — CUP PACEART REMOTE DEVICE CHECK
Date Time Interrogation Session: 20230606003826
Implantable Pulse Generator Implant Date: 20221031

## 2022-02-02 LAB — POC COVID19 BINAXNOW: SARS Coronavirus 2 Ag: NEGATIVE

## 2022-02-02 MED ORDER — PREDNISONE 10 MG PO TABS
ORAL_TABLET | ORAL | 0 refills | Status: DC
Start: 2022-02-02 — End: 2022-02-20

## 2022-02-02 MED ORDER — PROMETHAZINE-DM 6.25-15 MG/5ML PO SYRP: 5.0000 mL | ORAL_SOLUTION | Freq: Four times a day (QID) | ORAL | 0 refills | Status: DC | PRN

## 2022-02-02 NOTE — Patient Instructions (Signed)
Follow up as needed or as scheduled Your COVID test is negativeRenee Rival! START the Prednisone as directed w/ 3 pills this morning.  Take w/ food USE the cough syrup as needed Make sure you are drinking water to help w/ cough and drainage START OTC Claritin or Zyrtec to help w/ post-nasal drip Call with any questions or concerns Hang in there!!!

## 2022-02-02 NOTE — Telephone Encounter (Signed)
Cardioversion canceled - pt back in rhythm. Pt wanted to keep follow up on 6/14 for now.

## 2022-02-02 NOTE — Telephone Encounter (Signed)
Pt left vm asking if it was ok for his pcp to prescribe him phentermine because his insurance does not cover other weight loss medications.    Dr.Bensimhon is on vacation  Message routed to [employer]

## 2022-02-02 NOTE — Telephone Encounter (Signed)
I got the patient transmission and had Mardelle Matte, pa review it. He is back in normal sinus rhythm. I gave the transmission to tina.

## 2022-02-02 NOTE — Progress Notes (Signed)
   Subjective:    Patient ID: Mark [name], male    DOB: [birth year], 61 y.o.   MRN: [id number]  HPI Cough- pt states sxs started ~6 weeks ago.  Cough is dry, nonproductive.  Is now keeping him up at night.  Pt had subjective fever last night.  Bilateral hip pain and back pain this morning.  Pt reports sxs have changed in the last 2 days.  + HAs.  Denies chills.  No known sick contacts.  Pt reports hay fields surrounding the house have been recently cut and that's when sxs worsened.  Not currently taking allergy medication.  Has tried Mucinex and Coricidin w/o relief.   Review of Systems For ROS see HPI     Objective:   Physical Exam Vitals reviewed.  Constitutional:      General: He is not in acute distress.    Appearance: Normal appearance. He is obese. He is not ill-appearing.  HENT:     Head: Normocephalic and atraumatic.     Right Ear: Tympanic membrane and ear canal normal.     Left Ear: Tympanic membrane and ear canal normal.     Nose: Congestion present. No rhinorrhea.     Mouth/Throat:     Pharynx: No oropharyngeal exudate or posterior oropharyngeal erythema.     Comments: Copious PND Pulmonary:     Effort: Pulmonary effort is normal. No respiratory distress.     Breath sounds: Normal breath sounds. No wheezing, rhonchi or rales.     Comments: Deep, dry, hacking cough Musculoskeletal:     Cervical back: Neck supple.  Lymphadenopathy:     Cervical: No cervical adenopathy.  Skin:    General: Skin is warm and dry.  Neurological:     General: No focal deficit present.     Mental Status: He is alert and oriented to person, place, and time.  Psychiatric:        Mood and Affect: Mood normal.        Behavior: Behavior normal.          Assessment & Plan:   Cough- new.  Pt reports sxs acutely worsened in the last 2 days after ~6 weeks of sxs.  COVID test here in office is negative.  He does say that the hay fields surrounding his house were all mowed 2 days  ago.  Suspect that his current dry, hacking cough is due to post-viral inflammation and allergy drainage.  Start Prednisone to improve airway inflammation, cough syrup to allow sleep at night, and add OTC daily antihistamine.  Pt expressed understanding and is in agreement w/ plan.

## 2022-02-02 NOTE — Telephone Encounter (Signed)
Current ECG: 03-Feb-2022 00:38:16

## 2022-02-03 ENCOUNTER — Other Ambulatory Visit: Payer: Self-pay | Admitting: Internal Medicine

## 2022-02-04 ENCOUNTER — Ambulatory Visit (HOSPITAL_COMMUNITY): Admission: RE | Admit: 2022-02-04 | Payer: 59 | Source: Home / Self Care | Admitting: Cardiovascular Disease

## 2022-02-04 ENCOUNTER — Telehealth: Payer: Self-pay | Admitting: Family Medicine

## 2022-02-04 SURGERY — CARDIOVERSION
Anesthesia: Monitor Anesthesia Care

## 2022-02-04 MED ORDER — BENZONATATE 200 MG PO CAPS: 200.0000 mg | ORAL_CAPSULE | Freq: Three times a day (TID) | ORAL | 0 refills | Status: DC | PRN

## 2022-02-04 NOTE — Telephone Encounter (Signed)
PT stating that he need some stronger cough medication. PT stating the medication he is taking right now are not working for him. PT has been up for two nights coughing.

## 2022-02-04 NOTE — Telephone Encounter (Signed)
Pt notes cough medication is not offering enough relief he is staying up at night due to coughing fits, can you recommend something additional or different or should I have the patient return to the office for a follow up

## 2022-02-04 NOTE — Telephone Encounter (Signed)
Spoke with pt, he is very greatful and will call back if no relief

## 2022-02-04 NOTE — Telephone Encounter (Signed)
Patient advised and verbalized understanding 

## 2022-02-04 NOTE — Telephone Encounter (Signed)
I sent in Tessalon that he can take in combination w/ the syrup and the prednisone

## 2022-02-05 ENCOUNTER — Ambulatory Visit (INDEPENDENT_AMBULATORY_CARE_PROVIDER_SITE_OTHER)
Admission: RE | Admit: 2022-02-05 | Discharge: 2022-02-05 | Disposition: A | Discharge status: Home / Self Care | Payer: 59 | Source: Ambulatory Visit | Attending: Family Medicine | Admitting: Family Medicine

## 2022-02-05 ENCOUNTER — Encounter: Payer: Self-pay | Admitting: Family Medicine

## 2022-02-05 ENCOUNTER — Ambulatory Visit (INDEPENDENT_AMBULATORY_CARE_PROVIDER_SITE_OTHER): Payer: 59 | Admitting: Family Medicine

## 2022-02-05 VITALS — BP 130/84 | HR 70 | Temp 98.9°F | Resp 17 | Ht 70.0 in | Wt 254.4 lb

## 2022-02-05 DIAGNOSIS — R059 Cough, unspecified: Secondary | ICD-10-CM | POA: Diagnosis not present

## 2022-02-05 LAB — POC COVID19 BINAXNOW: SARS Coronavirus 2 Ag: NEGATIVE

## 2022-02-05 MED ORDER — ALBUTEROL SULFATE HFA 108 (90 BASE) MCG/ACT IN AERS: 2.0000 | INHALATION_SPRAY | Freq: Four times a day (QID) | RESPIRATORY_TRACT | 0 refills | Status: DC | PRN

## 2022-02-05 MED ORDER — DOXYCYCLINE HYCLATE 100 MG PO TABS
100.0000 mg | ORAL_TABLET | Freq: Two times a day (BID) | ORAL | 0 refills | Status: DC
Start: 2022-02-05 — End: 2022-02-20

## 2022-02-05 NOTE — Progress Notes (Signed)
   Subjective:    Patient ID: Mark [name], male    DOB: Jul 14, 1961, 61 y.o.   MRN: [id number]  HPI Cough- pt was here on 6/5 and started on Prednisone.  Pt reports sxs are worsening and he had a fever last night of 104.  Pt reports he is hearing crackles and wheezes at night.  + post-tussive emesis.  + diarrhea.  + sick contacts.  Unable to sleep b/c he is coughing 'continuously'.     Review of Systems For ROS see HPI     Objective:   Physical Exam Vitals reviewed.  Constitutional:      General: He is not in acute distress.    Appearance: He is well-developed. He is obese. He is ill-appearing.  HENT:     Head: Normocephalic and atraumatic.     Right Ear: Tympanic membrane normal.     Left Ear: Tympanic membrane normal.     Nose: No mucosal edema or rhinorrhea.     Right Sinus: No maxillary sinus tenderness or frontal sinus tenderness.     Left Sinus: No maxillary sinus tenderness or frontal sinus tenderness.     Mouth/Throat:     Pharynx: No oropharyngeal exudate or posterior oropharyngeal erythema.  Eyes:     Conjunctiva/sclera: Conjunctivae normal.     Pupils: Pupils are equal, round, and reactive to light.  Cardiovascular:     Rate and Rhythm: Normal rate and regular rhythm.     Heart sounds: Normal heart sounds.  Pulmonary:     Effort: Pulmonary effort is normal. No respiratory distress.     Breath sounds: Normal breath sounds. No wheezing.     Comments: Deep hacking cough Musculoskeletal:     Cervical back: Normal range of motion and neck supple.  Lymphadenopathy:     Cervical: No cervical adenopathy.  Skin:    General: Skin is warm and dry.  Neurological:     Mental Status: He is alert.           Assessment & Plan:  Cough- deteriorated.  Given worsening cough and new fever, PNA is likely.  COVID is again negative.  Will get CXR.  Start Doxy.  Albuterol prn.  Continue cough meds for symptom relief.

## 2022-02-05 NOTE — Patient Instructions (Addendum)
Follow up as needed or as scheduled Go to [address] and get your chest xray done START the Doxycycline twice daily- take w/ food USE the Albuterol inhaler as needed for wheezing, shortness of breath, or cough Make sure you are drinking fluids REST! Finish the Prednisone as directed Use the cough pills and syrup as needed Call with any questions or concerns Hang in there!

## 2022-02-06 ENCOUNTER — Other Ambulatory Visit: Payer: Self-pay | Admitting: Cardiology

## 2022-02-09 ENCOUNTER — Ambulatory Visit: Payer: 59 | Admitting: Family Medicine

## 2022-02-09 ENCOUNTER — Telehealth: Payer: Self-pay

## 2022-02-09 ENCOUNTER — Telehealth: Payer: Self-pay | Admitting: Family Medicine

## 2022-02-09 NOTE — Telephone Encounter (Signed)
Spoke w/ pt and informed him of his chest Xray results

## 2022-02-09 NOTE — Telephone Encounter (Signed)
-----   Message from Sheliah Hatch, MD sent at 02/07/2022  5:51 PM EDT ----- Chest xray shows L lower lobe pneumonia.  Thankfully you are being treated appropriately.  I hope you are feeling somewhat better!

## 2022-02-09 NOTE — Telephone Encounter (Signed)
error 

## 2022-02-11 ENCOUNTER — Ambulatory Visit (HOSPITAL_COMMUNITY): Payer: 59 | Admitting: Nurse Practitioner

## 2022-02-13 ENCOUNTER — Ambulatory Visit (INDEPENDENT_AMBULATORY_CARE_PROVIDER_SITE_OTHER): Payer: 59 | Admitting: *Deleted

## 2022-02-13 DIAGNOSIS — Z7901 Long term (current) use of anticoagulants: Secondary | ICD-10-CM

## 2022-02-13 DIAGNOSIS — I4891 Unspecified atrial fibrillation: Secondary | ICD-10-CM

## 2022-02-13 DIAGNOSIS — Z952 Presence of prosthetic heart valve: Secondary | ICD-10-CM

## 2022-02-13 LAB — POCT INR: INR: 4.6 — AB (ref 2.0–3.0)

## 2022-02-13 NOTE — Patient Instructions (Signed)
Description   Tomorrow take 1/2 tablet then continue taking Warfarin 1 tablet daily except for 1/2 tablet on Fridays. Recheck INR in 2 weeks. [facility] [phone]

## 2022-02-18 ENCOUNTER — Telehealth: Payer: Self-pay | Admitting: *Deleted

## 2022-02-18 NOTE — Telephone Encounter (Signed)
Prior Authorization for STUDY sent to [facility] via web portal. Tracking Number .    6/21 APPROVED-NO PA REQ- [employer] READ-AUTH#[id number] 6/19--05/19/22. 6/19 CLINICALS FAXED TO [employer]USH[employer] FOR TITRATION

## 2022-02-20 ENCOUNTER — Telehealth: Payer: Self-pay | Admitting: Family Medicine

## 2022-02-20 ENCOUNTER — Encounter: Payer: Self-pay | Admitting: Adult Health

## 2022-02-20 ENCOUNTER — Ambulatory Visit (INDEPENDENT_AMBULATORY_CARE_PROVIDER_SITE_OTHER): Payer: 59 | Admitting: Adult Health

## 2022-02-20 ENCOUNTER — Other Ambulatory Visit: Payer: Self-pay | Admitting: Student

## 2022-02-20 ENCOUNTER — Other Ambulatory Visit: Payer: Self-pay | Admitting: Adult Health

## 2022-02-20 VITALS — BP 100/70 | HR 70 | Temp 98.6°F | Ht 70.0 in | Wt 248.0 lb

## 2022-02-20 DIAGNOSIS — R051 Acute cough: Secondary | ICD-10-CM

## 2022-02-20 DIAGNOSIS — J189 Pneumonia, unspecified organism: Secondary | ICD-10-CM

## 2022-02-20 MED ORDER — AMOXICILLIN-POT CLAVULANATE 875-125 MG PO TABS: 1.0000 | ORAL_TABLET | Freq: Two times a day (BID) | ORAL | 0 refills | Status: DC

## 2022-02-20 MED ORDER — IPRATROPIUM-ALBUTEROL 0.5-2.5 (3) MG/3ML IN SOLN
3.0000 mL | Freq: Once | RESPIRATORY_TRACT | Status: AC
  Administered 2022-02-20: 3 mL via RESPIRATORY_TRACT

## 2022-02-20 MED ORDER — HYDROCODONE BIT-HOMATROP MBR 5-1.5 MG/5ML PO SOLN: 5.0000 mL | Freq: Three times a day (TID) | ORAL | 0 refills | Status: DC | PRN

## 2022-02-21 ENCOUNTER — Other Ambulatory Visit: Payer: Self-pay | Admitting: Cardiology

## 2022-02-24 ENCOUNTER — Other Ambulatory Visit: Payer: Self-pay | Admitting: Internal Medicine

## 2022-02-24 DIAGNOSIS — Z952 Presence of prosthetic heart valve: Secondary | ICD-10-CM

## 2022-02-24 DIAGNOSIS — I4891 Unspecified atrial fibrillation: Secondary | ICD-10-CM

## 2022-02-27 ENCOUNTER — Telehealth: Payer: Self-pay

## 2022-02-27 NOTE — Telephone Encounter (Signed)
Lpmtcb and reschedule INR appointment 

## 2022-02-28 ENCOUNTER — Other Ambulatory Visit: Payer: Self-pay | Admitting: Family Medicine

## 2022-03-02 ENCOUNTER — Ambulatory Visit (INDEPENDENT_AMBULATORY_CARE_PROVIDER_SITE_OTHER): Payer: 59

## 2022-03-02 DIAGNOSIS — I4891 Unspecified atrial fibrillation: Secondary | ICD-10-CM

## 2022-03-04 ENCOUNTER — Encounter (HOSPITAL_COMMUNITY): Payer: Self-pay

## 2022-03-04 ENCOUNTER — Ambulatory Visit (HOSPITAL_COMMUNITY): Payer: 59 | Admitting: Nurse Practitioner

## 2022-03-04 LAB — CUP PACEART REMOTE DEVICE CHECK
Date Time Interrogation Session: 20230705154726
Implantable Pulse Generator Implant Date: 20221031

## 2022-03-21 ENCOUNTER — Other Ambulatory Visit: Payer: Self-pay | Admitting: Internal Medicine

## 2022-03-21 DIAGNOSIS — I4891 Unspecified atrial fibrillation: Secondary | ICD-10-CM

## 2022-03-21 DIAGNOSIS — Z952 Presence of prosthetic heart valve: Secondary | ICD-10-CM

## 2022-03-23 ENCOUNTER — Ambulatory Visit (HOSPITAL_BASED_OUTPATIENT_CLINIC_OR_DEPARTMENT_OTHER): Payer: 59 | Attending: Cardiology | Admitting: Cardiology

## 2022-03-23 DIAGNOSIS — G4733 Obstructive sleep apnea (adult) (pediatric): Secondary | ICD-10-CM

## 2022-03-26 NOTE — Progress Notes (Signed)
Carelink Summary Report / Loop Recorder 

## 2022-04-02 ENCOUNTER — Telehealth: Payer: Self-pay | Admitting: *Deleted

## 2022-04-02 NOTE — Telephone Encounter (Signed)
Reached out to the patient and he said he will work on losing 15-25 lbs so he can qualify for the inspire device.

## 2022-04-02 NOTE — Telephone Encounter (Signed)
-----   Message from Quintella Reichert, MD sent at 03/25/2022  3:22 PM EDT ----- Please document in chart that patient showed up for split night study and mask desensitization but did not tolerate mask at all and he cancelled the study.  Please let patient know that no other options at this point unless he wants to be referred for an oral device with dentistry
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
## 2022-04-06 ENCOUNTER — Encounter: Payer: Self-pay | Admitting: Cardiology

## 2022-04-06 ENCOUNTER — Ambulatory Visit (INDEPENDENT_AMBULATORY_CARE_PROVIDER_SITE_OTHER): Payer: 59 | Admitting: Cardiology

## 2022-04-06 ENCOUNTER — Telehealth: Payer: Self-pay | Admitting: Pharmacist

## 2022-04-06 ENCOUNTER — Ambulatory Visit (INDEPENDENT_AMBULATORY_CARE_PROVIDER_SITE_OTHER): Payer: 59

## 2022-04-06 VITALS — BP 110/66 | HR 60 | Ht 70.0 in | Wt 258.0 lb

## 2022-04-06 DIAGNOSIS — G4733 Obstructive sleep apnea (adult) (pediatric): Secondary | ICD-10-CM | POA: Diagnosis not present

## 2022-04-06 DIAGNOSIS — I4819 Other persistent atrial fibrillation: Secondary | ICD-10-CM | POA: Diagnosis not present

## 2022-04-06 DIAGNOSIS — I5032 Chronic diastolic (congestive) heart failure: Secondary | ICD-10-CM

## 2022-04-06 DIAGNOSIS — I4891 Unspecified atrial fibrillation: Secondary | ICD-10-CM

## 2022-04-06 NOTE — Patient Instructions (Signed)
Medication Instructions:  None. *If you need a refill on your cardiac medications before your next appointment, please call your pharmacy*   Lab Work: None If you have labs (blood work) drawn today and your tests are completely normal, you will receive your results only by: MyChart Message (if you have MyChart) OR A paper copy in the mail If you have any lab test that is abnormal or we need to change your treatment, we will call you to review the results.   Testing/Procedures: None   Follow-Up: At [facility], you and your health needs are our priority.  As part of our continuing mission to provide you with exceptional heart care, we have created designated Provider Care Teams.  These Care Teams include your primary Cardiologist (physician) and Advanced Practice Providers (APPs -  Physician Assistants and Nurse Practitioners) who all work together to provide you with the care you need, when you need it.  We recommend signing up for the patient portal called "MyChart".  Sign up information is provided on this After Visit Summary.  MyChart is used to connect with patients for Virtual Visits (Telemedicine).  Patients are able to view lab/test results, encounter notes, upcoming appointments, etc.  Non-urgent messages can be sent to your provider as well.   To learn more about what you can do with MyChart, go to [email or web address].    Your next appointment:    The Afib Clinic will contact you for a Tikosyn load in the hospital.   Provider:   Vista Mink or Jorja Loa   Other Instructions Tikosyn (Dofetilide) Hospital Admission   Prior to day of admission:  Check with drug insurance company for cost of drug to ensure affordability --- Dofetilide 500 mcg twice a day.  GoodRx is an option if insurance copay is unaffordable.   All patients are tested for COVID-19 prior to admission.   No Benadryl is allowed 3 days prior to admission.   Please ensure no missed doses of your  anticoagulation (blood thinner) for 3 weeks prior to admission. If a dose is missed please notify our office immediately.   A pharmacist will review all your medications for potential interactions with Tikosyn. If any medication changes are needed prior to admission we will be in touch with you.   If any new medications are started AFTER your admission date is set with Radio producer. Please notify our office immediately so your medication list can be updated and reviewed by our pharmacist again.  On day of admission:  Tikosyn initiation requires a 3 night/4 day hospital stay with constant telemetry monitoring. You will have an EKG after each dose of Tikosyn as well as daily lab draws.   If the drug does not convert you to normal rhythm a cardioversion after the 4th dose of Tikosyn.   Afib Clinic office visit on the morning of admission is needed for preliminary labs/ekg.   Time of admission is dependent on bed availability in the hospital. In some instances, you will be sent home until bed is available. Rarely admission can be delayed to the following day if hospital census prevents available beds.   You may bring personal belongings/clothing with you to the hospital. Please leave your suitcase in the car until you arrive in admissions.   Questions please call our office at [phone]    Important Information About Sugar

## 2022-04-06 NOTE — Telephone Encounter (Signed)
Called pt and made him aware of the need for weekly therapeutic INR checks prior to Tikosyn admission. Pt verbalized understanding and schedule appt for Friday, 04/10/22 at 8:00am. Educated the pt on the importance of medication compliance. Pt verbalized understanding.

## 2022-04-06 NOTE — Telephone Encounter (Signed)
Medication list reviewed in anticipation of upcoming Tikosyn initiation. Patient is taking one contraindicated medication. Patient is taking verapamil which will need to be changed to an alternative medication prior to initiation of Tikosyn. Patient is not taking any QTc prolonging medications.   Patient is anticoagulated on warfarin. Patient will need 3 weekly therapeutic INR checks prior to admission. He will need an INR check the day of admission as well.  Patient will need to be counseled to avoid use of Benadryl while on Tikosyn and in the 2-3 days prior to Tikosyn initiation.
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
## 2022-04-07 LAB — CUP PACEART REMOTE DEVICE CHECK
Date Time Interrogation Session: 20230808071504
Implantable Pulse Generator Implant Date: 20221031

## 2022-04-08 NOTE — Telephone Encounter (Signed)
Pt notified will stop Verapamil 3 days prior to admission. Pt will start 4 weekly INR checks as of 8/11 will plan to admit on 9/5. Pt in agreement.

## 2022-04-10 ENCOUNTER — Ambulatory Visit (INDEPENDENT_AMBULATORY_CARE_PROVIDER_SITE_OTHER): Payer: 59

## 2022-04-10 DIAGNOSIS — I4891 Unspecified atrial fibrillation: Secondary | ICD-10-CM | POA: Diagnosis not present

## 2022-04-10 DIAGNOSIS — Z7901 Long term (current) use of anticoagulants: Secondary | ICD-10-CM | POA: Diagnosis not present

## 2022-04-10 DIAGNOSIS — Z952 Presence of prosthetic heart valve: Secondary | ICD-10-CM | POA: Diagnosis not present

## 2022-04-10 LAB — POCT INR: INR: 3.8 — AB (ref 2.0–3.0)

## 2022-04-10 NOTE — Patient Instructions (Signed)
continue taking Warfarin 1 tablet daily except for 1/2 tablet on Fridays. Recheck INR in 1 week. Coumadin Clinic [phone]

## 2022-04-15 ENCOUNTER — Other Ambulatory Visit: Payer: Self-pay | Admitting: Family Medicine

## 2022-04-17 ENCOUNTER — Ambulatory Visit (INDEPENDENT_AMBULATORY_CARE_PROVIDER_SITE_OTHER): Payer: 59 | Admitting: *Deleted

## 2022-04-17 DIAGNOSIS — Z7901 Long term (current) use of anticoagulants: Secondary | ICD-10-CM

## 2022-04-17 DIAGNOSIS — Z952 Presence of prosthetic heart valve: Secondary | ICD-10-CM

## 2022-04-17 DIAGNOSIS — I4891 Unspecified atrial fibrillation: Secondary | ICD-10-CM | POA: Diagnosis not present

## 2022-04-17 LAB — POCT INR: INR: 4 — AB (ref 2.0–3.0)

## 2022-04-17 NOTE — Patient Instructions (Signed)
Description   Continue taking Warfarin 1 tablet daily except for 1/2 tablet on Fridays. Recheck INR in 1 week-pending Tikosyn. Coumadin Clinic [phone] or [phone]

## 2022-04-18 ENCOUNTER — Other Ambulatory Visit: Payer: Self-pay | Admitting: Internal Medicine

## 2022-04-18 DIAGNOSIS — Z952 Presence of prosthetic heart valve: Secondary | ICD-10-CM

## 2022-04-18 DIAGNOSIS — I4891 Unspecified atrial fibrillation: Secondary | ICD-10-CM

## 2022-04-24 ENCOUNTER — Ambulatory Visit (INDEPENDENT_AMBULATORY_CARE_PROVIDER_SITE_OTHER): Payer: 59

## 2022-04-24 DIAGNOSIS — I4891 Unspecified atrial fibrillation: Secondary | ICD-10-CM

## 2022-04-24 DIAGNOSIS — Z952 Presence of prosthetic heart valve: Secondary | ICD-10-CM | POA: Diagnosis not present

## 2022-04-24 DIAGNOSIS — Z7901 Long term (current) use of anticoagulants: Secondary | ICD-10-CM | POA: Diagnosis not present

## 2022-04-24 LAB — POCT INR: INR: 4 — AB (ref 2.0–3.0)

## 2022-04-24 NOTE — Patient Instructions (Signed)
Continue taking Warfarin 1 tablet daily except for 1/2 tablet on Fridays. Recheck INR in 1 week-pending Tikosyn. Coumadin Clinic [phone] or [phone] 

## 2022-05-01 ENCOUNTER — Encounter (HOSPITAL_COMMUNITY): Payer: Self-pay

## 2022-05-01 ENCOUNTER — Telehealth (HOSPITAL_COMMUNITY): Payer: Self-pay

## 2022-05-01 ENCOUNTER — Ambulatory Visit: Payer: 59 | Attending: Cardiology

## 2022-05-01 ENCOUNTER — Other Ambulatory Visit: Payer: Self-pay | Admitting: Internal Medicine

## 2022-05-01 DIAGNOSIS — I4891 Unspecified atrial fibrillation: Secondary | ICD-10-CM

## 2022-05-01 DIAGNOSIS — Z952 Presence of prosthetic heart valve: Secondary | ICD-10-CM

## 2022-05-01 DIAGNOSIS — Z7901 Long term (current) use of anticoagulants: Secondary | ICD-10-CM

## 2022-05-01 LAB — POCT INR: INR: 4.4 — AB (ref 2.0–3.0)

## 2022-05-01 NOTE — Patient Instructions (Signed)
Continue taking Warfarin 1 tablet daily except for 1/2 tablet on Fridays. Recheck INR in 1 week-pending Tikosyn. Coumadin Clinic [phone] or [phone]

## 2022-05-01 NOTE — Telephone Encounter (Signed)
Contacted [employer] regarding Tikosyn admission. I spoke with Illene Labrador last name initial D on 05/01/22 @ 2:13pm.  Date of service on 05/05/22 Additional clinic review will be needed for continued authorization.

## 2022-05-05 ENCOUNTER — Ambulatory Visit (HOSPITAL_COMMUNITY)
Admission: RE | Admit: 2022-05-05 | Discharge: 2022-05-05 | Disposition: A | Discharge status: Home / Self Care | Payer: Commercial Managed Care - HMO | Source: Ambulatory Visit | Attending: Nurse Practitioner | Admitting: Nurse Practitioner

## 2022-05-05 ENCOUNTER — Ambulatory Visit (INDEPENDENT_AMBULATORY_CARE_PROVIDER_SITE_OTHER): Payer: Commercial Managed Care - HMO

## 2022-05-05 ENCOUNTER — Encounter (HOSPITAL_COMMUNITY): Payer: Self-pay | Admitting: Nurse Practitioner

## 2022-05-05 ENCOUNTER — Inpatient Hospital Stay (HOSPITAL_COMMUNITY)
Admission: AD | Admit: 2022-05-05 | Discharge: 2022-05-08 | DRG: 309 | Disposition: A | Discharge status: Home / Self Care | Payer: Commercial Managed Care - HMO | Source: Ambulatory Visit | Attending: Cardiology | Admitting: Cardiology

## 2022-05-05 ENCOUNTER — Other Ambulatory Visit: Payer: Self-pay

## 2022-05-05 VITALS — BP 104/70 | HR 59 | Ht 70.0 in | Wt 256.4 lb

## 2022-05-05 DIAGNOSIS — Z79899 Other long term (current) drug therapy: Secondary | ICD-10-CM | POA: Diagnosis not present

## 2022-05-05 DIAGNOSIS — Z952 Presence of prosthetic heart valve: Secondary | ICD-10-CM

## 2022-05-05 DIAGNOSIS — I4819 Other persistent atrial fibrillation: Secondary | ICD-10-CM | POA: Diagnosis present

## 2022-05-05 DIAGNOSIS — Z8249 Family history of ischemic heart disease and other diseases of the circulatory system: Secondary | ICD-10-CM | POA: Diagnosis not present

## 2022-05-05 DIAGNOSIS — Z9889 Other specified postprocedural states: Secondary | ICD-10-CM

## 2022-05-05 DIAGNOSIS — I350 Nonrheumatic aortic (valve) stenosis: Secondary | ICD-10-CM | POA: Diagnosis present

## 2022-05-05 DIAGNOSIS — Z8616 Personal history of COVID-19: Secondary | ICD-10-CM

## 2022-05-05 DIAGNOSIS — I5032 Chronic diastolic (congestive) heart failure: Secondary | ICD-10-CM | POA: Diagnosis present

## 2022-05-05 DIAGNOSIS — G4733 Obstructive sleep apnea (adult) (pediatric): Secondary | ICD-10-CM | POA: Diagnosis present

## 2022-05-05 DIAGNOSIS — I471 Supraventricular tachycardia: Secondary | ICD-10-CM | POA: Diagnosis present

## 2022-05-05 DIAGNOSIS — Z885 Allergy status to narcotic agent status: Secondary | ICD-10-CM

## 2022-05-05 DIAGNOSIS — D6869 Other thrombophilia: Secondary | ICD-10-CM

## 2022-05-05 DIAGNOSIS — I480 Paroxysmal atrial fibrillation: Secondary | ICD-10-CM | POA: Diagnosis present

## 2022-05-05 DIAGNOSIS — Z9049 Acquired absence of other specified parts of digestive tract: Secondary | ICD-10-CM | POA: Diagnosis not present

## 2022-05-05 DIAGNOSIS — Z7901 Long term (current) use of anticoagulants: Secondary | ICD-10-CM | POA: Diagnosis not present

## 2022-05-05 DIAGNOSIS — E785 Hyperlipidemia, unspecified: Secondary | ICD-10-CM | POA: Diagnosis present

## 2022-05-05 DIAGNOSIS — Z981 Arthrodesis status: Secondary | ICD-10-CM | POA: Diagnosis not present

## 2022-05-05 DIAGNOSIS — Z7982 Long term (current) use of aspirin: Secondary | ICD-10-CM

## 2022-05-05 DIAGNOSIS — Z801 Family history of malignant neoplasm of trachea, bronchus and lung: Secondary | ICD-10-CM

## 2022-05-05 DIAGNOSIS — I110 Hypertensive heart disease with heart failure: Secondary | ICD-10-CM | POA: Diagnosis present

## 2022-05-05 DIAGNOSIS — I2510 Atherosclerotic heart disease of native coronary artery without angina pectoris: Secondary | ICD-10-CM | POA: Diagnosis present

## 2022-05-05 DIAGNOSIS — I7121 Aneurysm of the ascending aorta, without rupture: Secondary | ICD-10-CM | POA: Diagnosis present

## 2022-05-05 DIAGNOSIS — I4891 Unspecified atrial fibrillation: Secondary | ICD-10-CM | POA: Diagnosis not present

## 2022-05-05 DIAGNOSIS — Z23 Encounter for immunization: Secondary | ICD-10-CM

## 2022-05-05 LAB — BASIC METABOLIC PANEL
Anion gap: 7 (ref 5–15)
BUN: 21 mg/dL — ABNORMAL HIGH (ref 6–20)
CO2: 22 mmol/L (ref 22–32)
Calcium: 8.8 mg/dL — ABNORMAL LOW (ref 8.9–10.3)
Chloride: 105 mmol/L (ref 98–111)
Creatinine, Ser: 1.22 mg/dL (ref 0.61–1.24)
GFR, Estimated: >60 mL/min (ref >60)
Glucose, Bld: 125 mg/dL — ABNORMAL HIGH (ref 70–99)
Potassium: 4.2 mmol/L (ref 3.5–5.1)
Sodium: 134 mmol/L — ABNORMAL LOW (ref 135–145)

## 2022-05-05 LAB — MAGNESIUM: Magnesium: 2.2 mg/dL (ref 1.7–2.4)

## 2022-05-05 LAB — HIV ANTIBODY (ROUTINE TESTING W REFLEX): HIV Screen 4th Generation wRfx: NONREACTIVE

## 2022-05-05 LAB — POCT INR: INR: 4.3 — AB (ref 2.0–3.0)

## 2022-05-05 MED ORDER — WARFARIN SODIUM 5 MG PO TABS
10.0000 mg | ORAL_TABLET | Freq: Once | ORAL | Status: AC
  Administered 2022-05-05: 10 mg via ORAL
  Filled 2022-05-05: qty 2

## 2022-05-05 MED ORDER — WARFARIN SODIUM 5 MG PO TABS: 5.0000 mg | ORAL_TABLET | ORAL | Status: DC

## 2022-05-05 MED ORDER — SODIUM CHLORIDE 0.9% FLUSH
3.0000 mL | Freq: Two times a day (BID) | INTRAVENOUS | Status: DC
  Administered 2022-05-06 – 2022-05-08 (×3): 3 mL via INTRAVENOUS

## 2022-05-05 MED ORDER — ASPIRIN 81 MG PO TBEC
81.0000 mg | DELAYED_RELEASE_TABLET | Freq: Every day | ORAL | Status: DC
  Administered 2022-05-05 – 2022-05-07 (×3): 81 mg via ORAL
  Filled 2022-05-05 (×3): qty 1

## 2022-05-05 MED ORDER — IBUPROFEN 600 MG PO TABS
600.0000 mg | ORAL_TABLET | Freq: Three times a day (TID) | ORAL | Status: DC | PRN
Start: 2022-05-05 — End: 2022-05-08
  Administered 2022-05-07 – 2022-05-08 (×2): 600 mg via ORAL
  Filled 2022-05-05 (×2): qty 1

## 2022-05-05 MED ORDER — SODIUM CHLORIDE 0.9% FLUSH: 3.0000 mL | INTRAVENOUS | Status: DC | PRN

## 2022-05-05 MED ORDER — CARVEDILOL 6.25 MG PO TABS
6.2500 mg | ORAL_TABLET | Freq: Two times a day (BID) | ORAL | Status: DC
  Administered 2022-05-05 – 2022-05-08 (×6): 6.25 mg via ORAL
  Filled 2022-05-05 (×6): qty 1

## 2022-05-05 MED ORDER — WARFARIN - PHARMACIST DOSING INPATIENT: Freq: Every day | Status: DC

## 2022-05-05 MED ORDER — SIMVASTATIN 20 MG PO TABS
20.0000 mg | ORAL_TABLET | Freq: Every day | ORAL | Status: DC
  Administered 2022-05-05 – 2022-05-07 (×3): 20 mg via ORAL
  Filled 2022-05-05 (×3): qty 1

## 2022-05-05 MED ORDER — IBUPROFEN 200 MG PO TABS: 600.0000 mg | ORAL_TABLET | ORAL | Status: DC | PRN

## 2022-05-05 MED ORDER — PANTOPRAZOLE SODIUM 40 MG PO TBEC
40.0000 mg | DELAYED_RELEASE_TABLET | Freq: Every day | ORAL | Status: DC
  Administered 2022-05-05 – 2022-05-08 (×4): 40 mg via ORAL
  Filled 2022-05-05 (×4): qty 1

## 2022-05-05 MED ORDER — DOFETILIDE 500 MCG PO CAPS
500.0000 ug | ORAL_CAPSULE | Freq: Two times a day (BID) | ORAL | Status: DC
  Administered 2022-05-05 – 2022-05-08 (×6): 500 ug via ORAL
  Filled 2022-05-05 (×6): qty 1

## 2022-05-05 MED ORDER — DILTIAZEM HCL 60 MG PO TABS: 60.0000 mg | ORAL_TABLET | ORAL | Status: DC | PRN

## 2022-05-05 MED ORDER — WARFARIN SODIUM 5 MG PO TABS
10.0000 mg | ORAL_TABLET | ORAL | Status: DC
  Administered 2022-05-06 – 2022-05-07 (×2): 10 mg via ORAL
  Filled 2022-05-05 (×2): qty 2

## 2022-05-05 MED ORDER — SPIRONOLACTONE 25 MG PO TABS
25.0000 mg | ORAL_TABLET | Freq: Every day | ORAL | Status: DC
  Administered 2022-05-05 – 2022-05-08 (×4): 25 mg via ORAL
  Filled 2022-05-05 (×4): qty 1

## 2022-05-05 MED ORDER — SODIUM CHLORIDE 0.9 % IV SOLN: 250.0000 mL | INTRAVENOUS | Status: DC | PRN

## 2022-05-05 MED ORDER — LISINOPRIL 20 MG PO TABS
40.0000 mg | ORAL_TABLET | Freq: Every day | ORAL | Status: DC
  Administered 2022-05-05 – 2022-05-08 (×4): 40 mg via ORAL
  Filled 2022-05-05 (×4): qty 2
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
## 2022-05-05 NOTE — Progress Notes (Addendum)
Pharmacy Review for Dofetilide (Tikosyn) Initiation  Admit Complaint: 61 y.o. male admitted 05/05/2022 with atrial fibrillation to be initiated on dofetilide.   Assessment:  Patient Exclusion Criteria: If any screening criteria checked as "Yes", then  patient  should NOT receive dofetilide until criteria item is corrected. If "Yes" please indicate correction plan.  YES  NO Patient  Exclusion Criteria Correction Plan  []  [x]  Baseline QTc interval is greater than or equal to 440 msec. IF above YES box checked dofetilide contraindicated unless patient has ICD; then may proceed if QTc 500-550 msec or with known ventricular conduction abnormalities may proceed with QTc 550-600 msec. QTc =  396 ms at Atrial Fibrillation clinic on 9/5 (See NP progress note)   []  [x]  Magnesium level is less than 1.8 mEq/l : Last magnesium:  Lab Results  Component Value Date   MG 2.2 05/05/2022         []  [x]  Potassium level is less than 4 mEq/l : Last potassium:  Lab Results  Component Value Date   K 4.2 05/05/2022         []  [x]  Patient is known or suspected to have a digoxin level greater than 2 ng/ml: No results found for: "DIGOXIN"    []  [x]  Creatinine clearance less than 20 ml/min (calculated using Cockcroft-Gault, actual body weight and serum creatinine): Estimated Creatinine Clearance: 84.3 mL/min (by C-G formula based on SCr of 1.22 mg/dL).    []  [x]  Patient has received drugs known to prolong the QT intervals within the last 48 hours (phenothiazines, tricyclics or tetracyclic antidepressants, erythromycin, H-1 antihistamines, cisapride, fluoroquinolones, azithromycin). Drugs not listed above may have an, as yet, undetected potential to prolong the QT interval, updated information on QT prolonging agents is available at this website:QT prolonging agents   []  [x]  Patient received a dose of hydrochlorothiazide (Oretic) alone or in any combination including triamterene (Dyazide, Maxzide) in the  last 48 hours.   []  [x]  Patient received a medication known to increase dofetilide plasma concentrations prior to initial dofetilide dose:  Trimethoprim (Primsol, Proloprim) in the last 36 hours Verapamil (Calan, Verelan) in the last 36 hours or a sustained release dose in the last 72 hours Megestrol (Megace) in the last 5 days  Cimetidine (Tagamet) in the last 6 hours Ketoconazole (Nizoral) in the last 24 hours Itraconazole (Sporanox) in the last 48 hours  Prochlorperazine (Compazine) in the last 36 hours  Patient stopped Verapamil on 9/1 as documented in NP progress note from Atrial Fibrillation Clinic. Patient changed to Diltiazem.  []  [x]  Patient is known to have a history of torsades de pointes; congenital or acquired long QT syndromes.   []  [x]  Patient has received a Class 1 antiarrhythmic with less than 2 half-lives since last dose. (Disopyramide, Quinidine, Procainamide, Lidocaine, Mexiletine, Flecainide, Propafenone)   []  [x]  Patient has received amiodarone therapy in the past 3 months or amiodarone level is greater than 0.3 ng/ml.    Patient has been appropriately anticoagulated with Warfarin.  Ordering provider was confirmed at 07/05/2022 if they are not listed on the [facility] Authorized Prescribers list.  Goal of Therapy: Follow renal function, electrolytes, potential drug interactions, and dose adjustment. Provide education and 1 week supply at discharge.  Plan:  [x]   Physician selected initial dose within range recommended for patients level of renal function - will monitor for response.  []   Physician selected initial dose outside of range recommended for patients level of renal function - will discuss if the  dose should be altered at this time.   Select One Calculated CrCl  Dose q12h  [x]  > 60 ml/min 500 mcg  []  40-60 ml/min 250 mcg  []  20-40 ml/min 125 mcg   2. Follow up QTc after the first 5 doses, renal function, electrolytes (K & Mg) daily x 3      days, dose adjustment, success of initiation and facilitate 1 week discharge supply as     clinically indicated.  3. Initiate Tikosyn education video (Call and ask for Tikosyn Video # 116).  4. Place Enrollment Form on the chart for discharge supply of dofetilide.   5:00 PM 05/05/2022

## 2022-05-05 NOTE — Addendum Note (Signed)
Encounter addended by: Shona Simpson, RN on: 05/05/2022 1:44 PM  Actions taken: Medication long-term status modified, Order list changed
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
## 2022-05-05 NOTE — Addendum Note (Signed)
Encounter addended by: Newman Nip, NP on: 05/05/2022 1:43 PM  Actions taken: Clinical Note Signed

## 2022-05-05 NOTE — Patient Instructions (Signed)
Description   Continue taking Warfarin 1 tablet daily except for 1/2 tablet on Fridays. Recheck INR in 1 week-pending Tikosyn. Coumadin Clinic [phone] or [phone]     

## 2022-05-05 NOTE — Progress Notes (Signed)
ANTICOAGULATION CONSULT NOTE - Initial Consult  Pharmacy Consult for Warfarin Indication: atrial fibrillation  Allergies  Allergen Reactions   Codeine Nausea And Vomiting   Morphine And Related Nausea And Vomiting   Percocet [Oxycodone-Acetaminophen] Nausea And Vomiting    Patient Measurements: Height: 5\' 10"  (177.8 cm) Weight: 116.3 kg (256 lb 6.4 oz) IBW/kg (Calculated) : 73  Vital Signs: Temp: 98.4 F (36.9 C) (09/05 1655) Temp Source: Oral (09/05 1655) BP: 104/70 (09/05 1118) Pulse Rate: 59 (09/05 1118)  Labs: Recent Labs    05/05/22 0813 05/05/22 1153  INR 4.3*  --   CREATININE  --  1.22    Estimated Creatinine Clearance: 84.3 mL/min (by C-G formula based on SCr of 1.22 mg/dL).   Medical History: Past Medical History:  Diagnosis Date   Aortic stenosis    a. due to bicuspid AoV; 1983 S/p mechanical AVR (St. Jude) - chronic coumadin with INR run between 3.5-4.5;  b. 03/2012 Echo: EF 60%, nl wall motion, mod dil LA.   Ascending aortic aneurysm [facility])    Atrial tachycardia (HCC)    a. admitted 07/2012   CHF (congestive heart failure) (HCC)    Coronary artery disease    COVID janurary 2022   ED (erectile dysfunction)    Gallstones    H/O cardiac catheterization    a. 12/2007 Cath: nl cors.   Headache(784.0)    Hepatitis A    a. as a child (from [place])   Hypertension    OSA (obstructive sleep apnea)    a. noncompliant with CPAP   Paroxysmal atrial fibrillation (HCC)    s/p afib ablation x 2    Medications:  Medications Prior to Admission  Medication Sig Dispense Refill Last Dose   aspirin EC 81 MG tablet Take 81 mg by mouth at bedtime.       carvedilol (COREG) 6.25 MG tablet TAKE 1 TABLET BY MOUTH TWICE DAILY 180 tablet 3    diltiazem (CARDIZEM) 60 MG tablet Take one tablet by mouth every 6 hours as needed for afib 15 tablet 1    ibuprofen (ADVIL) 200 MG tablet Take 600 mg by mouth as needed for moderate pain or headache.      lisinopril (ZESTRIL) 40  MG tablet TAKE 1 TABLET BY MOUTH EVERY DAY 90 tablet 3    pantoprazole (PROTONIX) 40 MG tablet TAKE 1 TABLET BY MOUTH EVERY DAY 30 tablet 1    simvastatin (ZOCOR) 20 MG tablet TAKE 1 TABLET BY MOUTH EVERYDAY AT BEDTIME 90 tablet 0    spironolactone (ALDACTONE) 25 MG tablet TAKE 1 TABLET BY MOUTH EVERY DAY 90 tablet 3    warfarin (COUMADIN) 10 MG tablet TAKE 1/2 TO 1 TABLET BY MOUTH DAILY AS DIRECTED BY COUMADIN CLINIC 30 tablet 0     Assessment: 61 years of age male who was admitted from atrial fibrillation clinic for Tikosyn start. Patient is on chronic anticoagulation with Warfarin for atrial fibrillation and a mechanical aortic valve replacement. Home regimen of 10 mg daily except for 5 mg on Friday. Last INR 4.3 - therapeutic for higher goal as documented by [facility]. Per clinic notes, INR has been in range for last 5 weeks. Last dose of Warfarin on 9/4 at 2100 PM.   Goal of Therapy:  INR 3.5 to 4.5  - Per Atrial Fibrillation Clinic Monitor platelets by anticoagulation protocol: Yes   Plan:  Continue Warfarin 5 mg po on Friday and 10mg  po all other days.  INR  daily x3 - if remains stable can go out to three times a week.   Link Snuffer, PharmD, BCPS, BCCCP Clinical Pharmacist Please refer to [facility] for [facility] Pharmacy numbers 05/05/2022,5:06 PM

## 2022-05-06 ENCOUNTER — Encounter (HOSPITAL_COMMUNITY): Payer: Self-pay | Admitting: Cardiology

## 2022-05-06 ENCOUNTER — Telehealth (HOSPITAL_COMMUNITY): Payer: Self-pay | Admitting: Pharmacy Technician

## 2022-05-06 ENCOUNTER — Other Ambulatory Visit (HOSPITAL_COMMUNITY): Payer: Self-pay

## 2022-05-06 ENCOUNTER — Other Ambulatory Visit: Payer: Self-pay

## 2022-05-06 DIAGNOSIS — I480 Paroxysmal atrial fibrillation: Secondary | ICD-10-CM | POA: Diagnosis not present

## 2022-05-06 LAB — PROTIME-INR
INR: 3.7 — ABNORMAL HIGH (ref 0.8–1.2)
Prothrombin Time: 36.3 seconds — ABNORMAL HIGH (ref 11.4–15.2)

## 2022-05-06 LAB — BASIC METABOLIC PANEL
Anion gap: 7 (ref 5–15)
BUN: 19 mg/dL (ref 6–20)
CO2: 26 mmol/L (ref 22–32)
Calcium: 8.7 mg/dL — ABNORMAL LOW (ref 8.9–10.3)
Chloride: 103 mmol/L (ref 98–111)
Creatinine, Ser: 1.08 mg/dL (ref 0.61–1.24)
GFR, Estimated: >60 mL/min (ref >60)
Glucose, Bld: 98 mg/dL (ref 70–99)
Potassium: 4.6 mmol/L (ref 3.5–5.1)
Sodium: 136 mmol/L (ref 135–145)

## 2022-05-06 LAB — MAGNESIUM: Magnesium: 2.2 mg/dL (ref 1.7–2.4)

## 2022-05-06 MED ORDER — INFLUENZA VAC SPLIT QUAD 0.5 ML IM SUSY
0.5000 mL | PREFILLED_SYRINGE | INTRAMUSCULAR | Status: AC
Start: 2022-05-07 — End: 2022-05-07
  Administered 2022-05-07: 0.5 mL via INTRAMUSCULAR
  Filled 2022-05-06: qty 0.5

## 2022-05-06 NOTE — Progress Notes (Signed)
Pharmacy: Dofetilide (Tikosyn) - Follow Up Assessment and Electrolyte Replacement  Pharmacy consulted to assist in monitoring and replacing electrolytes in this 61 y.o. male admitted on 05/05/2022 undergoing dofetilide initiation.  Labs:    Component Value Date/Time   K 4.6 05/06/2022 0444   MG 2.2 05/06/2022 0444     Plan: Potassium: K >/= 4: No additional supplementation needed  Magnesium: Mg > 2: No additional supplementation needed   Thank you for allowing pharmacy to participate in this patient's care   Harland German, PharmD Clinical Pharmacist **Pharmacist phone directory can now be found on [email or web address] ([email or web address]).  Listed under [facility].

## 2022-05-06 NOTE — Progress Notes (Signed)
ANTICOAGULATION CONSULT NOTE   Pharmacy Consult for Warfarin Indication: atrial fibrillation  Allergies  Allergen Reactions   Codeine Nausea And Vomiting   Morphine And Related Nausea And Vomiting   Percocet [Oxycodone-Acetaminophen] Nausea And Vomiting    Patient Measurements: Height: 5\' 10"  (177.8 cm) Weight: 116.3 kg (256 lb 6.4 oz) IBW/kg (Calculated) : 73  Vital Signs: Temp: 97.8 F (36.6 C) (09/06 0555) Temp Source: Oral (09/06 0555) BP: 107/73 (09/06 0826)  Labs: Recent Labs    05/05/22 0813 05/05/22 1153 05/06/22 0444  LABPROT  --   --  36.3*  INR 4.3*  --  3.7*  CREATININE  --  1.22 1.08     Estimated Creatinine Clearance: 95.2 mL/min (by C-G formula based on SCr of 1.08 mg/dL).   Medical History: Past Medical History:  Diagnosis Date   Aortic stenosis    a. due to bicuspid AoV; 1983 S/p mechanical AVR (St. Jude) - chronic coumadin with INR run between 3.5-4.5;  b. 03/2012 Echo: EF 60%, nl wall motion, mod dil LA.   Ascending aortic aneurysm [facility])    Atrial tachycardia (HCC)    a. admitted 07/2012   CHF (congestive heart failure) (HCC)    Coronary artery disease    COVID janurary 2022   ED (erectile dysfunction)    Gallstones    H/O cardiac catheterization    a. 12/2007 Cath: nl cors.   Headache(784.0)    Hepatitis A    a. as a child (from [place])   Hypertension    OSA (obstructive sleep apnea)    a. noncompliant with CPAP   Paroxysmal atrial fibrillation (HCC)    s/p afib ablation x 2    Medications:  Medications Prior to Admission  Medication Sig Dispense Refill Last Dose   aspirin EC 81 MG tablet Take 81 mg by mouth at bedtime.    05/04/2022   carvedilol (COREG) 6.25 MG tablet TAKE 1 TABLET BY MOUTH TWICE DAILY (Patient taking differently: Take 6.25 mg by mouth 2 (two) times daily with a meal.) 180 tablet 3 05/04/2022 at 2100   diltiazem (CARDIZEM) 60 MG tablet Take one tablet by mouth every 6 hours as needed for afib (Patient taking  differently: Take 60 mg by mouth daily as needed (For AFIB).) 15 tablet 1 unknown   ibuprofen (ADVIL) 200 MG tablet Take 600 mg by mouth every 6 (six) hours as needed for moderate pain or headache.   Past Week   lisinopril (ZESTRIL) 40 MG tablet TAKE 1 TABLET BY MOUTH EVERY DAY (Patient taking differently: Take 40 mg by mouth daily.) 90 tablet 3 05/04/2022   pantoprazole (PROTONIX) 40 MG tablet TAKE 1 TABLET BY MOUTH EVERY DAY (Patient taking differently: Take 40 mg by mouth daily.) 30 tablet 1 05/04/2022   simvastatin (ZOCOR) 20 MG tablet TAKE 1 TABLET BY MOUTH EVERYDAY AT BEDTIME (Patient taking differently: Take 20 mg by mouth every evening.) 90 tablet 0 05/04/2022   spironolactone (ALDACTONE) 25 MG tablet TAKE 1 TABLET BY MOUTH EVERY DAY (Patient taking differently: Take 25 mg by mouth daily.) 90 tablet 3 05/04/2022   warfarin (COUMADIN) 10 MG tablet TAKE 1/2 TO 1 TABLET BY MOUTH DAILY AS DIRECTED BY COUMADIN CLINIC (Patient taking differently: Take 5-10 mg by mouth See admin instructions. Take 10 mg tablet every day except on Fridays split the 10 mg for a whole 5 mg tablet per patient) 30 tablet 0 05/04/2022 at 2100    Assessment: 61 years of age male who was  admitted from atrial fibrillation clinic for Tikosyn start. Patient is on chronic anticoagulation with Warfarin for atrial fibrillation and a mechanical aortic valve replacement. Per clinic notes, INR has been in range for last 5 weeks -INR= 3.7.   Home warfarin regimen: 10 mg daily except for 5 mg on Friday.  Goal of Therapy:  INR 3.5 to 4.5  - Per Atrial Fibrillation Clinic Monitor platelets by anticoagulation protocol: Yes   Plan:  Continue Warfarin 5 mg po on Friday and 10mg  po all other days.  INR daily- if remains stable can go out to three times a week.   , PharmD Clinical Pharmacist **Pharmacist phone directory can now be found on [email or web address] ([email or web address]).  Listed under [facility].

## 2022-05-06 NOTE — Progress Notes (Signed)
Carelink Summary Report / Loop Recorder 

## 2022-05-06 NOTE — Telephone Encounter (Signed)
Pharmacy Patient Advocate Encounter  Insurance verification completed.    The patient is insured through [employer]   The patient is currently admitted and ran test claims for the following: dofetilide (Tikosyn) .  Copays and coinsurance results were relayed to Inpatient clinical team.

## 2022-05-06 NOTE — Progress Notes (Signed)
Morning EKG reviewed    Shows remains in NSR at 51 bpm with borderline QT at ~470 ms, that despite prolonged appearance corrects due to bradycardia.  Reviewed with Dr. Lalla Brothers who recommended continuing dose.   Continue  Tikosyn 500 mcg BID.   Potassium4.6 (09/06 0444) Magnesium  2.2 (09/06 0444) Creatinine, ser  1.08 (09/06 0444)  Plan for home Friday if QTc remains stable   Mark [name]  Pager: [phone]  05/06/2022 11:37 AM

## 2022-05-06 NOTE — Progress Notes (Signed)
Electrophysiology Rounding Note  Patient Name: Mark [name] Date of Encounter: 05/06/2022  Primary Cardiologist: Dr. Lalla Brothers  Electrophysiologist: Dr. Lalla Brothers   Subjective   Pt remains in NSR on Tikosyn 500 mcg BID   QTc from EKG last pm shows stable QTc at ~430  The patient is doing well today.  At this time, the patient denies chest pain, shortness of breath, or any new concerns.  Inpatient Medications    Scheduled Meds:  aspirin EC  81 mg Oral QHS   carvedilol  6.25 mg Oral BID   dofetilide  500 mcg Oral BID   lisinopril  40 mg Oral Daily   pantoprazole  40 mg Oral Daily   simvastatin  20 mg Oral q1800   sodium chloride flush  3 mL Intravenous Q12H   spironolactone  25 mg Oral Daily   warfarin  10 mg Oral Once per day on Sun Mon Tue Wed Thu Sat   [START ON 05/08/2022] warfarin  5 mg Oral Q Fri-1800   Warfarin - Pharmacist Dosing Inpatient   Does not apply q1600   Continuous Infusions:  sodium chloride     PRN Meds: sodium chloride, diltiazem, ibuprofen, sodium chloride flush   Vital Signs    Vitals:   05/05/22 1656 05/05/22 2024 05/06/22 0555 05/06/22 0826  BP: 123/82 121/69  107/73  Pulse:  62    Resp:  18    Temp:  98.3 F (36.8 C) 97.8 F (36.6 C)   TempSrc:  Oral Oral   Weight:      Height:        Intake/Output Summary (Last 24 hours) at 05/06/2022 0831 Last data filed at 05/05/2022 2015 Gross per 24 hour  Intake 240 ml  Output --  Net 240 ml   Filed Weights   05/05/22 1655  Weight: 116.3 kg    Physical Exam    GEN- The patient is well appearing, alert and oriented x 3 today.   Head- normocephalic, atraumatic Eyes-  Sclera clear, conjunctiva pink Ears- hearing intact Oropharynx- clear Neck- supple Lungs- Clear to ausculation bilaterally, normal work of breathing Heart- Regular rate and rhythm, no murmurs, rubs or gallops GI- soft, NT, ND, + BS Extremities- no clubbing, cyanosis, or edema Skin- no rash or lesion Psych-  euthymic mood, full affect Neuro- strength and sensation are intact  Labs    CBC No results for input(s): "WBC", "NEUTROABS", "HGB", "HCT", "MCV", "PLT" in the last 72 hours. Basic Metabolic Panel Recent Labs    51/76/16 1153 05/06/22 0444  NA 134* 136  K 4.2 4.6  CL 105 103  CO2 22 26  GLUCOSE 125* 98  BUN 21* 19  CREATININE 1.22 1.08  CALCIUM 8.8* 8.7*  MG 2.2 2.2    Potassium  Date/Time Value Ref Range Status  05/06/2022 04:44 AM 4.6 3.5 - 5.1 mmol/L Final    Comment:    HEMOLYSIS AT THIS LEVEL MAY AFFECT RESULT   Magnesium  Date/Time Value Ref Range Status  05/06/2022 04:44 AM 2.2 1.7 - 2.4 mg/dL Final    Comment:    Performed at [facility], [address] [place], [place] [address]    Telemetry    NSR 60s mostly (personally reviewed)  Radiology    No results found.   Patient Profile     Mark [name] is a 61 y.o. male with a past medical history significant for persistent atrial fibrillation.  They were admitted for tikosyn load.  Assessment & Plan    Persistent atrial fibrillation Pt remains in NSR on Tikosyn 500 mcg BID  Continue Coumadin Creatinine, ser  1.08 (09/06 0444) Magnesium  2.2 (09/06 0444) Potassium4.6 (09/06 0444) No electrolyte supplementation needed  Plan for home Friday if QTc remains stable.   For questions or updates, please contact [facility] Please consult [email or web address] for contact info under Cardiology/STEMI.  Signed, Graciella Freer, PA-C  05/06/2022, 8:31 AM

## 2022-05-06 NOTE — TOC Benefit Eligibility Note (Signed)
Patient Product/process development scientist completed.    The patient is currently admitted and upon discharge could be taking dofetilide (Tikosyn) 500 mcg.  The current 30 day co-pay is $84.93 due to a $7,050.00 deductible.   The patient is insured through [employer]     Roland Earl, CPhT Pharmacy Patient Advocate Specialist [facility] Patient Advocate Team Direct Number: [phone]  Fax: [phone]

## 2022-05-06 NOTE — Care Management (Signed)
  Transition of Care [facility]) Screening Note   Patient Details  Name: Mark [name] Date of Birth: April 14, 1961   Transition of Care [facility]) CM/SW Contact:    Gala Lewandowsky, RN Phone Number: 05/06/2022, 11:05 AM    Transition of Care Department [facility]) has reviewed the patient. Patient presented for Tikosyn Load. Benefits check submitted for cost. Case Manager will discuss cost and pharmacy of choice as the patient progresses.

## 2022-05-07 DIAGNOSIS — I480 Paroxysmal atrial fibrillation: Secondary | ICD-10-CM | POA: Diagnosis not present

## 2022-05-07 LAB — BASIC METABOLIC PANEL
Anion gap: 7 (ref 5–15)
BUN: 18 mg/dL (ref 6–20)
CO2: 25 mmol/L (ref 22–32)
Calcium: 9 mg/dL (ref 8.9–10.3)
Chloride: 105 mmol/L (ref 98–111)
Creatinine, Ser: 1.26 mg/dL — ABNORMAL HIGH (ref 0.61–1.24)
GFR, Estimated: >60 mL/min (ref >60)
Glucose, Bld: 100 mg/dL — ABNORMAL HIGH (ref 70–99)
Potassium: 4.4 mmol/L (ref 3.5–5.1)
Sodium: 137 mmol/L (ref 135–145)

## 2022-05-07 LAB — PROTIME-INR
INR: 4 — ABNORMAL HIGH (ref 0.8–1.2)
Prothrombin Time: 38.6 seconds — ABNORMAL HIGH (ref 11.4–15.2)

## 2022-05-07 LAB — MAGNESIUM: Magnesium: 2.1 mg/dL (ref 1.7–2.4)

## 2022-05-07 NOTE — Care Management (Signed)
05-07-22 1625 Case Manager spoke with patient regarding Tikosyn cost. Patient feels like co pay is expensive and wants to use Good [facility]. Patient would like initial Rx filled via [facility] and refills escribed to [facility]. Patient aware that if the co pay is too expensive at [facility] then he can use the Good [facility] locations [facility] or [facility].

## 2022-05-07 NOTE — Progress Notes (Signed)
Morning EKG reviewed    Shows remains in NSR at 50 bpm with stable QTc at ~430 ms.  Continue  Tikosyn 500 mcg BID.   Potassium4.4 (09/07 0240) Magnesium  2.1 (09/07 0240) Creatinine, ser  1.26* (09/07 0240)  Plan for home Friday if QTc remains stable   Luane School  Pager: [phone]  05/07/2022 11:22 AM

## 2022-05-07 NOTE — Progress Notes (Signed)
Mobility Specialist - Progress Note   05/07/22 1612  Mobility  Activity Ambulated with assistance in hallway  Level of Assistance Standby assist, set-up cues, supervision of patient - no hands on  Assistive Device None  Distance Ambulated (ft) 470 ft  Activity Response Tolerated well  $Mobility charge 1 Mobility    Pt was received in bed and agreeable to mobility. No c/o pain throughout ambulation. Pt was returned back to bed with all needs met.  Larey Seat

## 2022-05-07 NOTE — Progress Notes (Signed)
Pharmacy: Dofetilide (Tikosyn) - Follow Up Assessment and Electrolyte Replacement  Pharmacy consulted to assist in monitoring and replacing electrolytes in this 61 y.o. male admitted on 05/05/2022 undergoing dofetilide initiation.  Labs:    Component Value Date/Time   K 4.4 05/07/2022 0240   MG 2.1 05/07/2022 0240     Plan: Potassium: K >/= 4: No additional supplementation needed  Magnesium: Mg > 2: No additional supplementation needed  Thank you for allowing pharmacy to participate in this patient's care   Harland German, PharmD Clinical Pharmacist **Pharmacist phone directory can now be found on [email or web address] (PW [email or web address]).  Listed under [facility].

## 2022-05-07 NOTE — Progress Notes (Signed)
Electrophysiology Rounding Note  Patient Name: Mark [name] Date of Encounter: 05/07/2022  Primary Cardiologist: Hillis Range, MD  Electrophysiologist: Dr. Lalla Brothers   Subjective   Pt remains in NSR on Tikosyn 500 mcg BID   QTc from EKG last pm shows stable QTc at ~420  The patient is doing well today.  At this time, the patient denies chest pain, shortness of breath, or any new concerns.  Inpatient Medications    Scheduled Meds:  aspirin EC  81 mg Oral QHS   carvedilol  6.25 mg Oral BID   dofetilide  500 mcg Oral BID   influenza vac split quadrivalent PF  0.5 mL Intramuscular Tomorrow-1000   lisinopril  40 mg Oral Daily   pantoprazole  40 mg Oral Daily   simvastatin  20 mg Oral q1800   sodium chloride flush  3 mL Intravenous Q12H   spironolactone  25 mg Oral Daily   warfarin  10 mg Oral Once per day on Sun Mon Tue Wed Thu Sat   [START ON 05/08/2022] warfarin  5 mg Oral Q Fri-1800   Warfarin - Pharmacist Dosing Inpatient   Does not apply q1600   Continuous Infusions:  sodium chloride     PRN Meds: sodium chloride, diltiazem, ibuprofen, sodium chloride flush   Vital Signs    Vitals:   05/06/22 1302 05/06/22 2021 05/07/22 0507 05/07/22 0636  BP: 103/65 118/75 97/72 110/74  Pulse: (!) 47  (!) 49   Resp: 16 18 16    Temp: 97.9 F (36.6 C) 98.1 F (36.7 C) 98.1 F (36.7 C)   TempSrc: Oral Oral Oral   SpO2: 99%     Weight:      Height:        Intake/Output Summary (Last 24 hours) at 05/07/2022 0823 Last data filed at 05/06/2022 2030 Gross per 24 hour  Intake 360 ml  Output --  Net 360 ml   Filed Weights   05/05/22 1655  Weight: 116.3 kg    Physical Exam    GEN- The patient is well appearing, alert and oriented x 3 today.   Head- normocephalic, atraumatic Eyes-  Sclera clear, conjunctiva pink Ears- hearing intact Oropharynx- clear Neck- supple Lungs- Clear to ausculation bilaterally, normal work of breathing Heart- Regular rate and rhythm, no  murmurs, rubs or gallops GI- soft, NT, ND, + BS Extremities- no clubbing, cyanosis, or edema Skin- no rash or lesion Psych- euthymic mood, full affect Neuro- strength and sensation are intact  Labs    CBC No results for input(s): "WBC", "NEUTROABS", "HGB", "HCT", "MCV", "PLT" in the last 72 hours. Basic Metabolic Panel Recent Labs    07/05/22 0444 05/07/22 0240  NA 136 137  K 4.6 4.4  CL 103 105  CO2 26 25  GLUCOSE 98 100*  BUN 19 18  CREATININE 1.08 1.26*  CALCIUM 8.7* 9.0  MG 2.2 2.1    Potassium  Date/Time Value Ref Range Status  05/07/2022 02:40 AM 4.4 3.5 - 5.1 mmol/L Final   Magnesium  Date/Time Value Ref Range Status  05/07/2022 02:40 AM 2.1 1.7 - 2.4 mg/dL Final    Comment:    Performed at [facility], [address], [place], [place]    Telemetry    Sinus brady/NSR 50-60s (personally reviewed)  Radiology    No results found.   Patient Profile     Mark [name] is a 61 y.o. male with a past medical history significant for persistent  atrial fibrillation.  They were admitted for tikosyn load.   Assessment & Plan    Persistent atrial fibrillation Pt remains in NSR on Tikosyn 500 mcg BID  Continue Coumadin Creatinine, ser  1.26* (09/07 0240) Magnesium  2.1 (09/07 0240) Potassium4.4 (09/07 0240) No electrolyte supplementation needed  Plan for home Friday if QTc remains stable.  For questions or updates, please contact [facility] Please consult [email or web address] for contact info under Cardiology/STEMI.  Signed, Graciella Freer, PA-C  05/07/2022, 8:23 AM

## 2022-05-08 ENCOUNTER — Other Ambulatory Visit (HOSPITAL_COMMUNITY): Payer: Self-pay

## 2022-05-08 DIAGNOSIS — I480 Paroxysmal atrial fibrillation: Secondary | ICD-10-CM | POA: Diagnosis not present

## 2022-05-08 LAB — BASIC METABOLIC PANEL
Anion gap: 9 (ref 5–15)
BUN: 25 mg/dL — ABNORMAL HIGH (ref 6–20)
CO2: 25 mmol/L (ref 22–32)
Calcium: 9.3 mg/dL (ref 8.9–10.3)
Chloride: 104 mmol/L (ref 98–111)
Creatinine, Ser: 1.23 mg/dL (ref 0.61–1.24)
GFR, Estimated: >60 mL/min (ref >60)
Glucose, Bld: 105 mg/dL — ABNORMAL HIGH (ref 70–99)
Potassium: 5 mmol/L (ref 3.5–5.1)
Sodium: 138 mmol/L (ref 135–145)

## 2022-05-08 LAB — MAGNESIUM: Magnesium: 2.1 mg/dL (ref 1.7–2.4)

## 2022-05-08 LAB — PROTIME-INR
INR: 3.9 — ABNORMAL HIGH (ref 0.8–1.2)
Prothrombin Time: 38.1 seconds — ABNORMAL HIGH (ref 11.4–15.2)

## 2022-05-08 MED ORDER — DOFETILIDE 500 MCG PO CAPS
500.0000 ug | ORAL_CAPSULE | Freq: Two times a day (BID) | ORAL | 6 refills | Status: DC
Start: 2022-05-08 — End: 2022-05-14
  Filled 2022-05-08: qty 60, 30d supply, fill #0

## 2022-05-08 NOTE — Progress Notes (Signed)
EKG from yesterday evening 05/07/2022 reviewed    Shows remains in NSR at 53 bpm with stable QTc at ~420 ms.  Continue  Tikosyn 500 mcg BID.   Potassium5.0 (09/08 0520) Magnesium  2.1 (09/08 0520) Creatinine, ser  1.23 (09/08 0520)  Plan for home this afternoon if QTc remains stable.   Graciella Freer, PA-C  Pager: [phone]  05/08/2022 7:54 AM

## 2022-05-08 NOTE — Discharge Summary (Signed)
ELECTROPHYSIOLOGY PROCEDURE DISCHARGE SUMMARY    Patient ID: Mark [name],  MRN: [id number], DOB/AGE: 03-21-1961 61 y.o.  Admit date: 05/05/2022 Discharge date: 05/08/2022  Primary Care Physician: Sheliah Hatch, MD  Primary Cardiologist: Hillis Range, MD  Electrophysiologist: None   Primary Discharge Diagnosis:  1.  Paroxysmal atrial fibrillation status post Tikosyn loading this admission  Allergies  Allergen Reactions   Codeine Nausea And Vomiting   Morphine And Related Nausea And Vomiting   Percocet [Oxycodone-Acetaminophen] Nausea And Vomiting     Procedures This Admission:  1.  Tikosyn loading   Brief HPI: Mark [name] is a 61 y.o. male with a past medical history as noted above.  They were referred to EP in the outpatient setting for treatment options of atrial fibrillation.  Risks, benefits, and alternatives to Tikosyn were reviewed with the patient who wished to proceed.    Hospital Course:  The patient was admitted and Tikosyn was initiated.  Renal function and electrolytes were followed during the hospitalization.  Their QTc remained stable. The patient presented in sinus rhythm and did not require cardioversion They were monitored on telemetry up to discharge. On the day of discharge, they were examined by Dr. Lalla Brothers  who considered them stable for discharge to home.  Follow-up has been arranged with the Atrial Fibrillation clinic in approximately 1 week.   Physical Exam: Vitals:   05/07/22 0636 05/07/22 0825 05/08/22 0543 05/08/22 0815  BP: 110/74 111/76 107/70   Pulse:   (!) 49 (!) 49  Resp:   16 16  Temp:   (!) 97.5 F (36.4 C) (!) 97.5 F (36.4 C)  TempSrc:   Oral Oral  SpO2:    99%  Weight:      Height:        GEN- The patient is well appearing, alert and oriented x 3 today.   HEENT: normocephalic, atraumatic; sclera clear, conjunctiva pink; hearing intact; oropharynx clear; neck supple, no JVP Lymph- no cervical  lymphadenopathy Lungs- Clear to ausculation bilaterally, normal work of breathing.  No wheezes, rales, rhonchi Heart- Regular rate and rhythm, no murmurs, rubs or gallops, PMI not laterally displaced GI- soft, non-tender, non-distended, bowel sounds present, no hepatosplenomegaly Extremities- no clubbing, cyanosis, or edema; DP/PT/radial pulses 2+ bilaterally MS- no significant deformity or atrophy Skin- warm and dry, no rash or lesion Psych- euthymic mood, full affect Neuro- strength and sensation are intact  Labs:   Lab Results  Component Value Date   WBC 6.8 11/24/2021   HGB 13.2 11/24/2021   HCT 39.1 11/24/2021   MCV 91 11/24/2021   PLT 171 11/24/2021    Recent Labs  Lab 05/08/22 0520  NA 138  K 5.0  CL 104  CO2 25  BUN 25*  CREATININE 1.23  CALCIUM 9.3  GLUCOSE 105*    Discharge Medications:  Allergies as of 05/08/2022       Reactions   Codeine Nausea And Vomiting   Morphine And Related Nausea And Vomiting   Percocet [oxycodone-acetaminophen] Nausea And Vomiting        Medication List     TAKE these medications    aspirin EC 81 MG tablet Take 81 mg by mouth at bedtime.   carvedilol 6.25 MG tablet Commonly known as: COREG TAKE 1 TABLET BY MOUTH TWICE DAILY What changed: when to take this   diltiazem 60 MG tablet Commonly known as: Cardizem Take one tablet by mouth every 6 hours as needed for afib What  changed:  how much to take how to take this when to take this reasons to take this additional instructions   dofetilide 500 MCG capsule Commonly known as: TIKOSYN Take 1 capsule (500 mcg total) by mouth 2 (two) times daily.   ibuprofen 200 MG tablet Commonly known as: ADVIL Take 600 mg by mouth every 6 (six) hours as needed for moderate pain or headache.   lisinopril 40 MG tablet Commonly known as: ZESTRIL TAKE 1 TABLET BY MOUTH EVERY DAY   pantoprazole 40 MG tablet Commonly known as: PROTONIX TAKE 1 TABLET BY MOUTH EVERY DAY    simvastatin 20 MG tablet Commonly known as: ZOCOR TAKE 1 TABLET BY MOUTH EVERYDAY AT BEDTIME What changed: See the new instructions.   spironolactone 25 MG tablet Commonly known as: ALDACTONE TAKE 1 TABLET BY MOUTH EVERY DAY   warfarin 10 MG tablet Commonly known as: COUMADIN Take as directed. If you are unsure how to take this medication, talk to your nurse or doctor. Original instructions: TAKE 1/2 TO 1 TABLET BY MOUTH DAILY AS DIRECTED BY COUMADIN CLINIC What changed:  how much to take how to take this when to take this additional instructions        Disposition:    Follow-up Information     [facility] Follow up.   Specialty: Cardiology Why: on 9/15 at 130 for post hospital check Contact information: [address] [phone] [name] [address] [phone]                Duration of Discharge Encounter: Greater than 30 minutes including physician time.  Dustin Flock, PA-C  05/08/2022 12:22 PM

## 2022-05-08 NOTE — Progress Notes (Signed)
Pharmacy: Dofetilide (Tikosyn) - Follow Up Assessment and Electrolyte Replacement  Pharmacy consulted to assist in monitoring and replacing electrolytes in this 61 y.o. male admitted on 05/05/2022 undergoing dofetilide initiation.  Labs:    Component Value Date/Time   K 5.0 05/08/2022 0520   MG 2.1 05/08/2022 0520     Plan: Potassium: K >/= 4: No additional supplementation needed  Magnesium: Mg > 2: No additional supplementation needed  Thank you for allowing pharmacy to participate in this patient's care   Harland German, PharmD Clinical Pharmacist **Pharmacist phone directory can now be found on [email or web address] ([email or web address]).  Listed under [facility].

## 2022-05-08 NOTE — Progress Notes (Signed)
Discharge instructions reviewed with pt.  Copy of instructions given to pt. Pt verbalized understanding of all instructions, questions answered.  Pt will have Tikosyn script being filled by [facility], will be delivered to room or picked up upon discharge off the unit.    Wife on her way to pick up pt.

## 2022-05-11 ENCOUNTER — Ambulatory Visit (INDEPENDENT_AMBULATORY_CARE_PROVIDER_SITE_OTHER): Payer: Commercial Managed Care - HMO

## 2022-05-11 DIAGNOSIS — I4891 Unspecified atrial fibrillation: Secondary | ICD-10-CM

## 2022-05-13 LAB — CUP PACEART REMOTE DEVICE CHECK
Date Time Interrogation Session: 20230913090901
Implantable Pulse Generator Implant Date: 20221031

## 2022-05-14 ENCOUNTER — Ambulatory Visit
Admit: 2022-05-14 | Discharge: 2022-05-14 | Disposition: A | Discharge status: Home / Self Care | Payer: Commercial Managed Care - HMO | Attending: Nurse Practitioner | Admitting: Nurse Practitioner

## 2022-05-14 ENCOUNTER — Encounter (HOSPITAL_COMMUNITY): Payer: Self-pay | Admitting: Nurse Practitioner

## 2022-05-14 VITALS — BP 124/66 | HR 124 | Ht 70.0 in | Wt 261.4 lb

## 2022-05-14 DIAGNOSIS — D6869 Other thrombophilia: Secondary | ICD-10-CM

## 2022-05-14 DIAGNOSIS — I480 Paroxysmal atrial fibrillation: Secondary | ICD-10-CM | POA: Diagnosis present

## 2022-05-14 DIAGNOSIS — Z79899 Other long term (current) drug therapy: Secondary | ICD-10-CM | POA: Diagnosis not present

## 2022-05-14 DIAGNOSIS — G4733 Obstructive sleep apnea (adult) (pediatric): Secondary | ICD-10-CM | POA: Diagnosis not present

## 2022-05-14 DIAGNOSIS — I110 Hypertensive heart disease with heart failure: Secondary | ICD-10-CM | POA: Insufficient documentation

## 2022-05-14 LAB — TSH: TSH: 1.628 u[IU]/mL (ref 0.350–4.500)

## 2022-05-14 LAB — COMPREHENSIVE METABOLIC PANEL
ALT: 24 U/L (ref 0–44)
AST: 21 U/L (ref 15–41)
Albumin: 4 g/dL (ref 3.5–5.0)
Alkaline Phosphatase: 58 U/L (ref 38–126)
Anion gap: 5 (ref 5–15)
BUN: 18 mg/dL (ref 6–20)
CO2: 24 mmol/L (ref 22–32)
Calcium: 8.8 mg/dL — ABNORMAL LOW (ref 8.9–10.3)
Chloride: 108 mmol/L (ref 98–111)
Creatinine, Ser: 1.13 mg/dL (ref 0.61–1.24)
GFR, Estimated: >60 mL/min (ref >60)
Glucose, Bld: 100 mg/dL — ABNORMAL HIGH (ref 70–99)
Potassium: 4.1 mmol/L (ref 3.5–5.1)
Sodium: 137 mmol/L (ref 135–145)
Total Bilirubin: 1.1 mg/dL (ref 0.3–1.2)
Total Protein: 6.9 g/dL (ref 6.5–8.1)

## 2022-05-14 LAB — MAGNESIUM: Magnesium: 2.2 mg/dL (ref 1.7–2.4)

## 2022-05-14 MED ORDER — AMIODARONE HCL 200 MG PO TABS: ORAL_TABLET | ORAL | 3 refills | Status: DC

## 2022-05-14 NOTE — Patient Instructions (Signed)
STOP tikosyn  On Monday, September 18th -- start Amiodarone 200mg  twice a day for one month then reduce to once a day take with food
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
## 2022-05-15 ENCOUNTER — Ambulatory Visit (INDEPENDENT_AMBULATORY_CARE_PROVIDER_SITE_OTHER): Payer: Commercial Managed Care - HMO | Admitting: *Deleted

## 2022-05-15 DIAGNOSIS — Z7901 Long term (current) use of anticoagulants: Secondary | ICD-10-CM

## 2022-05-15 DIAGNOSIS — I4819 Other persistent atrial fibrillation: Secondary | ICD-10-CM

## 2022-05-15 DIAGNOSIS — Z952 Presence of prosthetic heart valve: Secondary | ICD-10-CM

## 2022-05-15 LAB — POCT INR: INR: 4.4 — AB (ref 2.0–3.0)

## 2022-05-15 NOTE — Patient Instructions (Addendum)
Description   Continue taking Warfarin 1 tablet daily except for 1/2 tablet on Fridays. Recheck INR in 1 week-05/18/22 will start Amio 200mg  BID x 1 month then QD. Coumadin Clinic [phone] or [phone]

## 2022-05-16 ENCOUNTER — Other Ambulatory Visit: Payer: Self-pay | Admitting: Internal Medicine

## 2022-05-16 DIAGNOSIS — I4891 Unspecified atrial fibrillation: Secondary | ICD-10-CM

## 2022-05-16 DIAGNOSIS — Z952 Presence of prosthetic heart valve: Secondary | ICD-10-CM

## 2022-05-18 ENCOUNTER — Telehealth (HOSPITAL_COMMUNITY): Payer: Self-pay | Admitting: *Deleted

## 2022-05-18 NOTE — Telephone Encounter (Signed)
Prescription refill request received for warfarin Lov: 05/14/22 Mark [name])  Next INR check: 05/22/22 Warfarin tablet strength: 10mg   Appropriate dose and refill sent to requested pharmacy.

## 2022-05-18 NOTE — Telephone Encounter (Signed)
Pt heart rates continue in the 120s in AF. Amiodarone starting today. Per Adline Peals PA will increase coreg to 12.5mg  BID unless bradycardia then go back to normal dosing. PT verbalized agreement.

## 2022-05-20 NOTE — Progress Notes (Signed)
Clarification of Diagnosis.   Chronic Diastolic Heart Failure  Mark [name]" [place], [place]  05/20/2022 7:03 AM

## 2022-05-22 ENCOUNTER — Ambulatory Visit: Payer: Commercial Managed Care - HMO | Attending: Cardiovascular Disease | Admitting: *Deleted

## 2022-05-22 DIAGNOSIS — Z5181 Encounter for therapeutic drug level monitoring: Secondary | ICD-10-CM | POA: Diagnosis not present

## 2022-05-22 DIAGNOSIS — Z952 Presence of prosthetic heart valve: Secondary | ICD-10-CM

## 2022-05-22 DIAGNOSIS — I4891 Unspecified atrial fibrillation: Secondary | ICD-10-CM | POA: Diagnosis not present

## 2022-05-22 LAB — POCT INR: INR: 3.1 — AB (ref 2.0–3.0)

## 2022-05-22 NOTE — Patient Instructions (Signed)
Description   Take 1 tablet of warfarin today and then continue taking Warfarin 1 tablet daily except for 1/2 tablet on Fridays. Recheck INR in 1 week. Started  Amio 200mg  BID x 1 month (05/18/2022) then QD. Coumadin Clinic [phone] or [phone]

## 2022-05-24 ENCOUNTER — Other Ambulatory Visit (HOSPITAL_COMMUNITY): Payer: Self-pay | Admitting: Internal Medicine

## 2022-05-26 ENCOUNTER — Encounter (HOSPITAL_COMMUNITY): Payer: Self-pay

## 2022-05-26 ENCOUNTER — Encounter (HOSPITAL_COMMUNITY): Payer: Commercial Managed Care - HMO | Admitting: Nurse Practitioner

## 2022-05-27 ENCOUNTER — Ambulatory Visit (HOSPITAL_COMMUNITY)
Admission: RE | Admit: 2022-05-27 | Discharge: 2022-05-27 | Disposition: A | Discharge status: Home / Self Care | Payer: Commercial Managed Care - HMO | Source: Ambulatory Visit | Attending: Nurse Practitioner | Admitting: Nurse Practitioner

## 2022-05-27 VITALS — BP 138/82 | HR 46

## 2022-05-27 DIAGNOSIS — I480 Paroxysmal atrial fibrillation: Secondary | ICD-10-CM | POA: Diagnosis not present

## 2022-05-27 DIAGNOSIS — R001 Bradycardia, unspecified: Secondary | ICD-10-CM | POA: Insufficient documentation

## 2022-05-27 NOTE — Progress Notes (Signed)
Pt is in for EKG after starting amiodarone load at 200 mg bid. He is having less afib every day. He is feeling improved. He is in S brady. He typically runs slow in the 40's and is not symptomatic. I will get an appointment with Dr. Quentin Ore for one month f/u.  EKG-Vent. rate 46 BPM PR interval 170 ms QRS duration 110 ms QT/QTcB 440/385 ms P-R-T axes 59 101 26 Sinus bradycardia Rightward axis Inferior infarct , age undetermined Abnormal ECG When compared with ECG of 14-May-2022 13:35, PREVIOUS ECG IS PRESENT

## 2022-05-28 NOTE — Progress Notes (Signed)
Carelink Summary Report / Loop Recorder 

## 2022-05-29 ENCOUNTER — Ambulatory Visit: Payer: Commercial Managed Care - HMO | Attending: Internal Medicine

## 2022-05-29 DIAGNOSIS — I4891 Unspecified atrial fibrillation: Secondary | ICD-10-CM

## 2022-05-29 DIAGNOSIS — Z7901 Long term (current) use of anticoagulants: Secondary | ICD-10-CM | POA: Diagnosis not present

## 2022-05-29 DIAGNOSIS — Z952 Presence of prosthetic heart valve: Secondary | ICD-10-CM | POA: Diagnosis not present

## 2022-05-29 LAB — POCT INR: INR: 5.6 — AB (ref 2.0–3.0)

## 2022-05-29 NOTE — Patient Instructions (Signed)
HOLD TODAY ONLY AND EAT GREENS then DECREASE TO 1 tablet daily except for 1/2 tablet on Monday and Friday. Recheck INR in 1 week. Started  Amio 200mg  BID x 1 month (05/18/2022) then QD. Coumadin Clinic [phone] or [phone]

## 2022-06-02 ENCOUNTER — Other Ambulatory Visit (HOSPITAL_COMMUNITY): Payer: Self-pay

## 2022-06-02 DIAGNOSIS — I5032 Chronic diastolic (congestive) heart failure: Secondary | ICD-10-CM

## 2022-06-04 ENCOUNTER — Ambulatory Visit: Payer: Commercial Managed Care - HMO

## 2022-06-07 ENCOUNTER — Other Ambulatory Visit: Payer: Self-pay | Admitting: Family Medicine

## 2022-06-08 ENCOUNTER — Ambulatory Visit: Payer: Commercial Managed Care - HMO | Attending: Internal Medicine

## 2022-06-10 ENCOUNTER — Ambulatory Visit: Payer: Commercial Managed Care - HMO | Attending: Internal Medicine

## 2022-06-10 DIAGNOSIS — Z7901 Long term (current) use of anticoagulants: Secondary | ICD-10-CM

## 2022-06-10 DIAGNOSIS — Z952 Presence of prosthetic heart valve: Secondary | ICD-10-CM

## 2022-06-10 DIAGNOSIS — I4891 Unspecified atrial fibrillation: Secondary | ICD-10-CM | POA: Diagnosis not present

## 2022-06-10 LAB — CUP PACEART REMOTE DEVICE CHECK
Date Time Interrogation Session: 20231009231353
Implantable Pulse Generator Implant Date: 20221031

## 2022-06-10 LAB — POCT INR: INR: 4.1 — AB (ref 2.0–3.0)

## 2022-06-10 NOTE — Patient Instructions (Signed)
Description   Continue on same dosage of Warfarin 1 tablet daily except for 1/2 tablet on Monday and Friday. Recheck INR in 2 weeks. Started  Amio 200mg  BID x 1 month (05/18/2022) then QD. Coumadin Clinic [phone] or [phone]

## 2022-06-13 ENCOUNTER — Other Ambulatory Visit: Payer: Self-pay | Admitting: Cardiology

## 2022-06-13 DIAGNOSIS — Z952 Presence of prosthetic heart valve: Secondary | ICD-10-CM

## 2022-06-13 DIAGNOSIS — I4891 Unspecified atrial fibrillation: Secondary | ICD-10-CM

## 2022-06-15 ENCOUNTER — Ambulatory Visit (INDEPENDENT_AMBULATORY_CARE_PROVIDER_SITE_OTHER): Payer: 59

## 2022-06-15 DIAGNOSIS — I4891 Unspecified atrial fibrillation: Secondary | ICD-10-CM

## 2022-06-26 ENCOUNTER — Ambulatory Visit: Payer: Commercial Managed Care - HMO | Attending: Cardiology | Admitting: *Deleted

## 2022-06-26 DIAGNOSIS — I4891 Unspecified atrial fibrillation: Secondary | ICD-10-CM

## 2022-06-26 DIAGNOSIS — Z5181 Encounter for therapeutic drug level monitoring: Secondary | ICD-10-CM | POA: Diagnosis not present

## 2022-06-26 DIAGNOSIS — Z952 Presence of prosthetic heart valve: Secondary | ICD-10-CM

## 2022-06-26 LAB — POCT INR: POC INR: 5.8

## 2022-06-26 NOTE — Patient Instructions (Signed)
Description   Hold warfarin today  Tomorrow take 1/2 a tablet of warfarin Then START taking warfarin 1 tablet daily except for 1/2 a tablet on Monday, Wednesday and Friday. Started  Amio 200mg  BID x 1 month (05/18/2022) then QD. Coumadin Clinic [phone] or [phone]

## 2022-06-30 ENCOUNTER — Encounter: Payer: Self-pay | Admitting: Cardiology

## 2022-06-30 ENCOUNTER — Ambulatory Visit: Payer: Commercial Managed Care - HMO | Attending: Cardiology | Admitting: Cardiology

## 2022-06-30 VITALS — BP 124/82 | HR 49 | Ht 71.0 in | Wt 267.2 lb

## 2022-06-30 DIAGNOSIS — Z952 Presence of prosthetic heart valve: Secondary | ICD-10-CM | POA: Diagnosis not present

## 2022-06-30 DIAGNOSIS — I4819 Other persistent atrial fibrillation: Secondary | ICD-10-CM

## 2022-06-30 DIAGNOSIS — Z79899 Other long term (current) drug therapy: Secondary | ICD-10-CM | POA: Diagnosis not present

## 2022-06-30 DIAGNOSIS — G4733 Obstructive sleep apnea (adult) (pediatric): Secondary | ICD-10-CM | POA: Diagnosis not present
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
## 2022-06-30 NOTE — Patient Instructions (Signed)
Medication Instructions:  None  *If you need a refill on your cardiac medications before your next appointment, please call your pharmacy*   Lab Work: None  If you have labs (blood work) drawn today and your tests are completely normal, you will receive your results only by: MyChart Message (if you have MyChart) OR A paper copy in the mail If you have any lab test that is abnormal or we need to change your treatment, we will call you to review the results.   Testing/Procedures: None    Follow-Up: At [facility], you and your health needs are our priority.  As part of our continuing mission to provide you with exceptional heart care, we have created designated Provider Care Teams.  These Care Teams include your primary Cardiologist (physician) and Advanced Practice Providers (APPs -  Physician Assistants and Nurse Practitioners) who all work together to provide you with the care you need, when you need it.  We recommend signing up for the patient portal called "MyChart".  Sign up information is provided on this After Visit Summary.  MyChart is used to connect with patients for Virtual Visits (Telemedicine).  Patients are able to view lab/test results, encounter notes, upcoming appointments, etc.  Non-urgent messages can be sent to your provider as well.   To learn more about what you can do with MyChart, go to [email or web address].[email or web address].    Your next appointment:   6 month(s)  The format for your next appointment:   In Person  Provider:   You will see one of the following Advanced Practice Providers on your designated Care Team:   Renee Ursuy, PA-C [name] "Andy" Tillery, PA-C      Other Instructions None   Important Information About Sugar      

## 2022-07-02 ENCOUNTER — Encounter: Payer: Self-pay | Admitting: Family Medicine

## 2022-07-02 ENCOUNTER — Ambulatory Visit (INDEPENDENT_AMBULATORY_CARE_PROVIDER_SITE_OTHER): Payer: Commercial Managed Care - HMO | Admitting: Family Medicine

## 2022-07-02 VITALS — BP 122/88 | HR 54 | Temp 98.2°F | Ht 71.0 in | Wt 265.8 lb

## 2022-07-02 DIAGNOSIS — R059 Cough, unspecified: Secondary | ICD-10-CM

## 2022-07-02 DIAGNOSIS — R062 Wheezing: Secondary | ICD-10-CM

## 2022-07-02 LAB — POC COVID19 BINAXNOW: SARS Coronavirus 2 Ag: NEGATIVE

## 2022-07-02 MED ORDER — FUROSEMIDE 20 MG PO TABS: 20.0000 mg | ORAL_TABLET | Freq: Every day | ORAL | 0 refills | Status: DC

## 2022-07-02 NOTE — Patient Instructions (Signed)
Follow up by MyChart on Monday and let me know how things are going We'll notify you of your lab results and make any changes if needed START Furosemide (Lasix) once daily x5 days (until Monday) to help w/ fluid and wheezing IF the swelling or wheezing worsens over the weekend, go get checked out! Call with any questions or concerns Hang in there!!

## 2022-07-02 NOTE — Progress Notes (Signed)
Carelink Summary Report / Loop Recorder 

## 2022-07-02 NOTE — Progress Notes (Signed)
   Subjective:    Patient ID: Mark [name], male    DOB: 1961-06-24, 61 y.o.   MRN: [id number]  HPI 'i'm wheezing really bad'- occurs in the afternoon and at night.  Sunday had 'a real sore throat, like razor blades'.  Cough is non-productive.  No fevers.  Mild body aches have improved.  Denies sinus pain/pressure.  No ear pain.  Some SOB.  Some swelling of legs and ankles.  No CP.  No palpitations.   Review of Systems For ROS see HPI     Objective:   Physical Exam Vitals reviewed.  Constitutional:      General: He is not in acute distress.    Appearance: Normal appearance. He is obese. He is not ill-appearing.  HENT:     Right Ear: Tympanic membrane and ear canal normal.     Left Ear: Tympanic membrane and ear canal normal.     Mouth/Throat:     Mouth: Mucous membranes are moist.     Pharynx: No oropharyngeal exudate or posterior oropharyngeal erythema.  Cardiovascular:     Rate and Rhythm: Normal rate and regular rhythm.     Pulses: Normal pulses.  Pulmonary:     Effort: Pulmonary effort is normal. No respiratory distress.     Breath sounds: Normal breath sounds. No wheezing or rhonchi.     Comments: Deep hacking cough Musculoskeletal:     Right lower leg: Edema present.     Left lower leg: Edema (trace) present.  Lymphadenopathy:     Cervical: No cervical adenopathy.  Skin:    General: Skin is warm and dry.  Neurological:     Mental Status: He is alert.           Assessment & Plan:  Cough- new.  Suspect this is post-viral given that he had other viral sxs over the weekend that have resolved.  Treat supportively.  Wheezing- new.  Concern for cardiac wheeze as pt reports this occurs later in the day and at night.  Indicates weight gain and some LE edema.  No improvement in wheeze using albuterol.  Not currently in Afib and denies CP.  Some SOB.  Check BNP and BMP to assess volume.  Start Lasix 20mg  daily x5 days and pt is to let me know how he is doing on  Monday.  He understands that if wheezing or shortness of breath worsens, he is to be evaluated in the ER.

## 2022-07-03 ENCOUNTER — Ambulatory Visit: Payer: Commercial Managed Care - HMO | Attending: Cardiovascular Disease

## 2022-07-03 DIAGNOSIS — I4891 Unspecified atrial fibrillation: Secondary | ICD-10-CM

## 2022-07-03 DIAGNOSIS — Z952 Presence of prosthetic heart valve: Secondary | ICD-10-CM

## 2022-07-03 DIAGNOSIS — Z5181 Encounter for therapeutic drug level monitoring: Secondary | ICD-10-CM

## 2022-07-03 LAB — BASIC METABOLIC PANEL
BUN: 18 mg/dL (ref 6–23)
CO2: 30 mEq/L (ref 19–32)
Calcium: 9.1 mg/dL (ref 8.4–10.5)
Chloride: 102 mEq/L (ref 96–112)
Creatinine, Ser: 1.31 mg/dL (ref 0.40–1.50)
GFR: 59.77 mL/min — ABNORMAL LOW (ref >60.00)
Glucose, Bld: 107 mg/dL — ABNORMAL HIGH (ref 70–99)
Potassium: 4.1 mEq/L (ref 3.5–5.1)
Sodium: 137 mEq/L (ref 135–145)

## 2022-07-03 LAB — POCT INR: INR: 3.2 — AB (ref 2.0–3.0)

## 2022-07-03 LAB — BRAIN NATRIURETIC PEPTIDE: Pro B Natriuretic peptide (BNP): 30 pg/mL (ref 0.0–100.0)

## 2022-07-03 NOTE — Patient Instructions (Signed)
TAKE 1 TABLET TODAY ONLY Then CONTINUE 1 tablet daily except for 1/2 a tablet on Monday, Wednesday and Friday. Started  Amio 200mg  BID x 1 month (05/18/2022) then QD. Coumadin Clinic [phone] or [phone];  INR 2 weeks

## 2022-07-03 NOTE — Progress Notes (Signed)
Called pt and he started the medicine last night and is feeling well

## 2022-07-06 ENCOUNTER — Other Ambulatory Visit: Payer: Self-pay

## 2022-07-06 ENCOUNTER — Encounter: Payer: Self-pay | Admitting: Family Medicine

## 2022-07-06 DIAGNOSIS — I10 Essential (primary) hypertension: Secondary | ICD-10-CM

## 2022-07-06 NOTE — Telephone Encounter (Signed)
Spoke to pt and informed him per DR Birdie Riddle if the wheezing is better to continue the Lasix and recheck his kidney function at a lab only visit with a BMP. Pt is scheduled for Friday Nov 10,2023 at 815 am and lab order is in place

## 2022-07-10 ENCOUNTER — Telehealth: Payer: Self-pay

## 2022-07-10 ENCOUNTER — Other Ambulatory Visit (INDEPENDENT_AMBULATORY_CARE_PROVIDER_SITE_OTHER): Payer: Commercial Managed Care - HMO

## 2022-07-10 DIAGNOSIS — I10 Essential (primary) hypertension: Secondary | ICD-10-CM | POA: Diagnosis not present

## 2022-07-10 LAB — BASIC METABOLIC PANEL
BUN: 24 mg/dL — ABNORMAL HIGH (ref 6–23)
CO2: 28 mEq/L (ref 19–32)
Calcium: 9.1 mg/dL (ref 8.4–10.5)
Chloride: 102 mEq/L (ref 96–112)
Creatinine, Ser: 1.32 mg/dL (ref 0.40–1.50)
GFR: 59.22 mL/min — ABNORMAL LOW (ref >60.00)
Glucose, Bld: 101 mg/dL — ABNORMAL HIGH (ref 70–99)
Potassium: 4.4 mEq/L (ref 3.5–5.1)
Sodium: 136 mEq/L (ref 135–145)

## 2022-07-10 NOTE — Telephone Encounter (Signed)
-----   Message from Sheliah Hatch, MD sent at 07/10/2022  3:49 PM EST ----- Labs look good!  No changes at this time

## 2022-07-10 NOTE — Telephone Encounter (Signed)
Informed pt of lab results  

## 2022-07-17 ENCOUNTER — Ambulatory Visit: Payer: Commercial Managed Care - HMO | Attending: Cardiology

## 2022-07-17 DIAGNOSIS — Z7901 Long term (current) use of anticoagulants: Secondary | ICD-10-CM | POA: Diagnosis not present

## 2022-07-17 DIAGNOSIS — Z952 Presence of prosthetic heart valve: Secondary | ICD-10-CM | POA: Diagnosis not present

## 2022-07-17 DIAGNOSIS — I4891 Unspecified atrial fibrillation: Secondary | ICD-10-CM

## 2022-07-17 LAB — POCT INR: INR: 5.4 — AB (ref 2.0–3.0)

## 2022-07-17 NOTE — Patient Instructions (Signed)
HOLD TODAY ONLY THEN CONTINUE 1 tablet daily except for 1/2 a tablet on Monday, Wednesday and Friday. Started  Amio 200mg  BID x 1 month (05/18/2022) then QD. Coumadin Clinic [phone] or [phone];  INR 2 weeks; EAT GREENS TONIGHT.

## 2022-07-20 ENCOUNTER — Ambulatory Visit (INDEPENDENT_AMBULATORY_CARE_PROVIDER_SITE_OTHER): Payer: Commercial Managed Care - HMO

## 2022-07-20 DIAGNOSIS — I4819 Other persistent atrial fibrillation: Secondary | ICD-10-CM | POA: Diagnosis not present

## 2022-07-20 LAB — CUP PACEART REMOTE DEVICE CHECK
Date Time Interrogation Session: 20231119231432
Implantable Pulse Generator Implant Date: 20221031

## 2022-07-24 ENCOUNTER — Other Ambulatory Visit: Payer: Self-pay | Admitting: Family Medicine

## 2022-07-31 ENCOUNTER — Ambulatory Visit: Payer: Commercial Managed Care - HMO

## 2022-08-07 ENCOUNTER — Ambulatory Visit: Payer: Commercial Managed Care - HMO | Attending: Cardiovascular Disease

## 2022-08-07 DIAGNOSIS — I4891 Unspecified atrial fibrillation: Secondary | ICD-10-CM | POA: Diagnosis not present

## 2022-08-07 DIAGNOSIS — Z952 Presence of prosthetic heart valve: Secondary | ICD-10-CM

## 2022-08-07 DIAGNOSIS — Z5181 Encounter for therapeutic drug level monitoring: Secondary | ICD-10-CM | POA: Diagnosis not present

## 2022-08-07 DIAGNOSIS — Z7901 Long term (current) use of anticoagulants: Secondary | ICD-10-CM

## 2022-08-07 LAB — POCT INR: INR: 5.4 — AB (ref 2.0–3.0)

## 2022-08-07 NOTE — Patient Instructions (Signed)
HOLD TODAY AND TOMORROW ONLY THEN CONTINUE 1 tablet daily except for 1/2 a tablet on Monday, Wednesday and Friday. Started  Amio 200mg  BID x 1 month (05/18/2022) then QD. Coumadin Clinic [phone] or [phone];  INR 2 weeks;

## 2022-08-21 ENCOUNTER — Ambulatory Visit: Payer: Commercial Managed Care - HMO | Attending: Cardiology

## 2022-08-21 DIAGNOSIS — Z952 Presence of prosthetic heart valve: Secondary | ICD-10-CM | POA: Diagnosis not present

## 2022-08-21 DIAGNOSIS — I4891 Unspecified atrial fibrillation: Secondary | ICD-10-CM | POA: Diagnosis not present

## 2022-08-21 DIAGNOSIS — Z7901 Long term (current) use of anticoagulants: Secondary | ICD-10-CM | POA: Diagnosis not present

## 2022-08-21 DIAGNOSIS — Z5181 Encounter for therapeutic drug level monitoring: Secondary | ICD-10-CM | POA: Diagnosis not present

## 2022-08-21 LAB — POCT INR: INR: 3.8 — AB (ref 2.0–3.0)

## 2022-08-21 NOTE — Patient Instructions (Signed)
CONTINUE 1 tablet daily except for 1/2 a tablet on Monday, Wednesday and Friday. Started  Amio 200mg  BID x 1 month (05/18/2022) then QD. Coumadin Clinic [phone] or [phone];  INR 4 weeks;

## 2022-08-26 ENCOUNTER — Ambulatory Visit (HOSPITAL_COMMUNITY)
Admission: RE | Admit: 2022-08-26 | Discharge: 2022-08-26 | Disposition: A | Discharge status: Home / Self Care | Payer: Commercial Managed Care - HMO | Source: Ambulatory Visit | Attending: Family Medicine | Admitting: Family Medicine

## 2022-08-26 ENCOUNTER — Ambulatory Visit (HOSPITAL_BASED_OUTPATIENT_CLINIC_OR_DEPARTMENT_OTHER)
Admission: RE | Admit: 2022-08-26 | Discharge: 2022-08-26 | Disposition: A | Discharge status: Home / Self Care | Payer: Commercial Managed Care - HMO | Source: Ambulatory Visit | Attending: Internal Medicine | Admitting: Internal Medicine

## 2022-08-26 ENCOUNTER — Other Ambulatory Visit (HOSPITAL_COMMUNITY): Payer: Self-pay

## 2022-08-26 ENCOUNTER — Ambulatory Visit (INDEPENDENT_AMBULATORY_CARE_PROVIDER_SITE_OTHER): Payer: 59

## 2022-08-26 VITALS — BP 120/78 | HR 56 | Wt 260.0 lb

## 2022-08-26 DIAGNOSIS — I5032 Chronic diastolic (congestive) heart failure: Secondary | ICD-10-CM | POA: Diagnosis not present

## 2022-08-26 DIAGNOSIS — I110 Hypertensive heart disease with heart failure: Secondary | ICD-10-CM | POA: Insufficient documentation

## 2022-08-26 DIAGNOSIS — G4733 Obstructive sleep apnea (adult) (pediatric): Secondary | ICD-10-CM | POA: Insufficient documentation

## 2022-08-26 DIAGNOSIS — I493 Ventricular premature depolarization: Secondary | ICD-10-CM | POA: Diagnosis not present

## 2022-08-26 DIAGNOSIS — I480 Paroxysmal atrial fibrillation: Secondary | ICD-10-CM | POA: Diagnosis not present

## 2022-08-26 DIAGNOSIS — I350 Nonrheumatic aortic (valve) stenosis: Secondary | ICD-10-CM | POA: Insufficient documentation

## 2022-08-26 DIAGNOSIS — I7121 Aneurysm of the ascending aorta, without rupture: Secondary | ICD-10-CM | POA: Insufficient documentation

## 2022-08-26 DIAGNOSIS — Z8616 Personal history of COVID-19: Secondary | ICD-10-CM | POA: Diagnosis not present

## 2022-08-26 DIAGNOSIS — Z91199 Patient's noncompliance with other medical treatment and regimen due to unspecified reason: Secondary | ICD-10-CM | POA: Insufficient documentation

## 2022-08-26 DIAGNOSIS — I4819 Other persistent atrial fibrillation: Secondary | ICD-10-CM

## 2022-08-26 DIAGNOSIS — Z952 Presence of prosthetic heart valve: Secondary | ICD-10-CM | POA: Diagnosis not present

## 2022-08-26 DIAGNOSIS — Z79899 Other long term (current) drug therapy: Secondary | ICD-10-CM | POA: Insufficient documentation

## 2022-08-26 DIAGNOSIS — Z950 Presence of cardiac pacemaker: Secondary | ICD-10-CM | POA: Insufficient documentation

## 2022-08-26 LAB — CUP PACEART REMOTE DEVICE CHECK
Date Time Interrogation Session: 20231226231554
Implantable Pulse Generator Implant Date: 20221031

## 2022-08-26 LAB — ECHOCARDIOGRAM COMPLETE
AR max vel: 0.84 cm2
AV Area VTI: 0.77 cm2
AV Area mean vel: 0.77 cm2
AV Mean grad: 20 mmHg
AV Peak grad: 36.5 mmHg
Ao pk vel: 3.02 m/s
Area-P 1/2: 3.93 cm2
Calc EF: 51.8 %
S' Lateral: 3.8 cm
Single Plane A2C EF: 50 %
Single Plane A4C EF: 51.6 %

## 2022-08-26 MED ORDER — DAPAGLIFLOZIN PROPANEDIOL 10 MG PO TABS: 10.0000 mg | ORAL_TABLET | Freq: Every day | ORAL | 3 refills | Status: DC

## 2022-08-26 NOTE — Progress Notes (Signed)
Patient ID: Mark [name], male   DOB: May 06, 1961, 61 y.o.   MRN: [id number]    Advanced Heart Failure Clinic Note    PCP: Mark [name] HF: Dr. Gala [name]   HPI:  Mark [name] is a 62 year old male with a history of bicuspid aortic valve complicated by endocarditis.  He is status post St. Jude mechanical aortic valve replacement in 1983.  He also has a history of hyperlipidemia and PAF s/p DC-CV in 9/11. OSA noncompliant with CPAP.   Over the past few years year, he has had some progression in the gradients across his valve.  He underwent cardiac catheterization in May 2009 which showed normal coronary arteries.The valve leaflets were seen to be opening well on fluoroscopy.  Had TEE.  While there was some turbulence around the valve, the leaflets seemed to be moving well.  There was no obvious pannus formation.  Gradient on his transthoracic echo was within the moderate range at 33. 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 He was seen by Dr. [name] who agreed  with continue watchul waiting.   Has struggled with AF/palpitations.  Has seen Dr. [name] and had two ablations in 8/13 and 3/14. Now has Linq monitor in (placed 04/06/17). Seen in AF Clinic in 7/21 AF burden was low. Most recent device interrogation from 12/21 reviewed AF burden 13.9% with longest event 49 hr duration (overall AF burden over life of ILR ~5%). AF worse during COVID   CT chest 4/22: [name] dilation of ascending aorta 4.8 cm  He returns for regular f/u. Placed on amio several months ago and AF burden much improved. Still with 2-3 episodes per week. Still SOB with moderate activity but very active. Having mild ankle edema. Taking lasix 20 daily. No orthopnea or PND. Has been unable to lose weight. Unable to get [facility]. Does not tolerate CPAP. Has tried other devices with no luck. Worked up for [employer] device but told he needed to lose weight first. Having nausea and HAs.  Echo today 08/26/22 EF 50-55% moderate AS  mean gradient Asc ao 5.0 Personally reviewed   Echo 10/22 EF 60% moderate AS mean gradient Personally reviewed  Cardiac studies:  Echo 2/21: EF 60-65% Aortic valve mean gradient measures 29.0 mmHg. Aortic valve peak gradient  measures 57.8 mmHg. AVA 1.2 cm2 Personally reviewed  Echo 6/19: EF 55-60% AoV Mean gradient (S): 27 mm Hg  Echo 7/18  EF 55-60% AoV Mean gradient (S): 26 mm Hg. Peak gradient (S): 51 mm Hg. AVA 1.35 cm2 Echo 12/14/2014 LVEF 55-60%, Aortic valve gradient 44 to 60 mm Hg, increased from 22 to 35 mm Hg. Echo 4/17 EF 60-65%  AoV Mean gradient (S): 34 mm Hg. Peak gradient (S): 63 mm Hg.  Review of systems complete and found to be negative unless listed in HPI.   Past Medical History:  Diagnosis Date   Aortic stenosis    a. due to bicuspid AoV; 1983 S/p mechanical AVR (St. Jude) - chronic coumadin with INR run between 3.5-4.5;  b. 03/2012 Echo: EF 60%, nl wall motion, mod dil LA.   Ascending aortic aneurysm [facility])    Atrial tachycardia    a. admitted 07/2012   CHF (congestive heart failure) (HCC)    Coronary artery disease    COVID janurary 2022   ED (erectile dysfunction)    Gallstones    H/O cardiac catheterization    a. 12/2007 Cath: nl cors.   Headache(784.0)    Hepatitis  A    a. as a child (from [place])   Hypertension    OSA (obstructive sleep apnea)    a. noncompliant with CPAP   Paroxysmal atrial fibrillation (HCC)    s/p afib ablation x 2    Current Outpatient Medications  Medication Sig Dispense Refill   amiodarone (PACERONE) 200 MG tablet Take 1 tablet by mouth twice a day for 1 month then reduce to 1 tablet daily 90 tablet 3   aspirin EC 81 MG tablet Take 81 mg by mouth at bedtime.      carvedilol (COREG) 6.25 MG tablet TAKE 1 TABLET BY MOUTH TWICE DAILY 180 tablet 3   diltiazem (CARDIZEM) 60 MG tablet Take one tablet by mouth every 6 hours as needed for afib 15 tablet 1   furosemide (LASIX) 20 MG tablet TAKE 1 TABLET BY MOUTH EVERY  DAY 90 tablet 1   ibuprofen (ADVIL) 200 MG tablet Take 600 mg by mouth every 6 (six) hours as needed for moderate pain or headache.     lisinopril (ZESTRIL) 40 MG tablet TAKE 1 TABLET BY MOUTH EVERY DAY 90 tablet 3   pantoprazole (PROTONIX) 40 MG tablet TAKE 1 TABLET BY MOUTH EVERY DAY 30 tablet 1   simvastatin (ZOCOR) 20 MG tablet TAKE 1 TABLET BY MOUTH EVERYDAY AT BEDTIME 90 tablet 0   spironolactone (ALDACTONE) 25 MG tablet TAKE 1 TABLET BY MOUTH EVERY DAY 90 tablet 3   warfarin (COUMADIN) 10 MG tablet TAKE 1/2 TO 1 TABLET BY MOUTH DAILY AS DIRECTED BY COUMADIN CLINIC 90 tablet 1   No current facility-administered medications for this encounter.    PHYSICAL EXAM: Vitals:   08/26/22 1423  BP: 120/78  Pulse: (!) 56  SpO2: 98%  Weight: 117.9 kg (260 lb)   Wt Readings from Last 3 Encounters:  08/26/22 117.9 kg (260 lb)  07/02/22 120.6 kg (265 lb 12.8 oz)  06/30/22 121.2 kg (267 lb 3.2 oz)    General:  Well appearing. No resp difficulty HEENT: normal Neck: supple. no JVD. Carotids 2+ bilat; no bruits. No lymphadenopathy or thryomegaly appreciated. Cor: PMI nondisplaced. Regular rate & rhythm. 3/6 AS s2 preseved Lungs: clear Abdomen: obese soft, nontender, nondistended. No hepatosplenomegaly. No bruits or masses. Good bowel sounds. Extremities: no cyanosis, clubbing, rash, rt-1+ edema Neuro: alert & orientedx3, cranial nerves grossly intact. moves all 4 extremities w/o difficulty. Affect pleasant   1. Aortic stenosis s/p mechanical AVR - Echo 03/02/17 EF normal Mean AoV gradient 26.  - Echo 6/19 AVR [name] mean gradient 27 - Echo 2/21: EF 60-65% Aortic valve mean gradient measures 29.0 mmHg. Aortic valve peak gradient measures 57.8 mmHg. AVA 1.2 cm2  - Echo 10/22 EF 60% moderate AS mean gradient Personally reviewed - Echo today 08/26/22 EF 50-55% moderate AS mean gradient Asc ao 5.0 Personally reviewed - AS [name]. Continue yearly echos - Warfarin per Coumadin  Clini -Reminded about SBE prophylaxis  2. Chronic diastolic HF - has mild volume overload - Add Farxiga 10   3. HTN - Blood pressure well controlled. Continue current regimen.  4. Aortic root aneurysm - Previously followed by Dr. [name].  - CT scan 2/20 which measures 5.0 x 4.9 cm, previously 4.8 x 4.6 cm measured similarly on examinations previously - CT chest 4/22: [name] dilation of ascending aorta 4.8 cm - [name] on echo today - HR [name]. BP ok   5. Lipids - Followed by Dr. Beverely Low .  6. Paroxysmal Atrial fibrillation  -  Followed by Dr. Lalla Brothers - Now on amio - continue coumadin - working on CPAP therapies   7. OSA - unable to tolerate to CPAP -needs to lose weight to qualify for Inspire devie  8. Obesity - attempting to get GLP1RA - Has referral to [facility]  Arvilla Meres, MD 2:45 PM

## 2022-08-26 NOTE — Progress Notes (Signed)
  Echocardiogram 2D Echocardiogram has been performed.  Mark [name] 08/26/2022, 10:37 AM

## 2022-08-26 NOTE — Patient Instructions (Signed)
Medication Changes:  START Farxiga 10 mg Daily  Lab Work:  none  Testing/Procedures:  Repeat echocardiogram in 1 year  Referrals:  none  Special Instructions // Education:  none  Follow-Up in: 1 year with an echocardiogram, **PLEASE CALL OUR OFFICE IN OCTOBER TO SCHEDULE THESE APPOINTMENTS  At the Advanced Heart Failure Clinic, you and your health needs are our priority. We have a designated team specialized in the treatment of Heart Failure. This Care Team includes your primary Heart Failure Specialized Cardiologist (physician), Advanced Practice Providers (APPs- Physician Assistants and Nurse Practitioners), and Pharmacist who all work together to provide you with the care you need, when you need it.   You may see any of the following providers on your designated Care Team at your next follow up:  Dr. Arvilla Meres Dr. Marca Ancona Dr. Marcos Eke, NP Robbie Lis, [place] [facility] [place], [place] Brynda Peon, NP Karle Plumber, PharmD   Please be sure to bring in all your medications bottles to every appointment.   Need to Contact us:  If you have any questions or concerns before your next appointment please send [place] a message through [place] or call our office at [phone].    TO LEAVE A MESSAGE FOR THE NURSE SELECT OPTION 2, PLEASE LEAVE A MESSAGE INCLUDING: YOUR NAME DATE OF BIRTH CALL BACK NUMBER REASON FOR CALL**this is important as we prioritize the call backs  YOU WILL RECEIVE A CALL BACK THE SAME DAY AS LONG AS YOU CALL BEFORE 4:00 PM

## 2022-09-01 NOTE — Progress Notes (Signed)
Carelink Summary Report / Loop Recorder 

## 2022-09-04 ENCOUNTER — Other Ambulatory Visit: Payer: Self-pay | Admitting: Family Medicine

## 2022-09-11 ENCOUNTER — Ambulatory Visit: Payer: Commercial Managed Care - HMO | Admitting: Family Medicine

## 2022-09-11 ENCOUNTER — Encounter: Payer: Self-pay | Admitting: Family Medicine

## 2022-09-11 MED ORDER — SAXENDA 18 MG/3ML ~~LOC~~ SOPN: PEN_INJECTOR | SUBCUTANEOUS | 0 refills | Status: DC

## 2022-09-11 NOTE — Progress Notes (Signed)
   Subjective:    Patient ID: Mark [name], male    DOB: October 23, 1960, 62 y.o.   MRN: [id number]  HPI Obesity- pt is down 5 lbs since last visit.  Cardiology wants him to lose weight.  Insurance doesn't cover [employer], Ozempic.  Has appt w/ [facility] but that's 9 months away.  Pt is walking daily- easily over 10,000 steps.  Eats limited sugar.  Hoping to get something to jump start his weight loss efforts.   Review of Systems For ROS see HPI     Objective:   Physical Exam Vitals reviewed.  Constitutional:      General: He is not in acute distress.    Appearance: Normal appearance. He is obese. He is not ill-appearing.  HENT:     Head: Normocephalic and atraumatic.  Cardiovascular:     Rate and Rhythm: Normal rate and regular rhythm.     Heart sounds: Murmur (mechanical click) heard.  Pulmonary:     Effort: Pulmonary effort is normal. No respiratory distress.     Breath sounds: Normal breath sounds. No wheezing or rhonchi.  Musculoskeletal:     Right lower leg: No edema.     Left lower leg: No edema.  Neurological:     General: No focal deficit present.     Mental Status: He is alert and oriented to person, place, and time.  Psychiatric:        Mood and Affect: Mood normal.        Behavior: Behavior normal.        Thought Content: Thought content normal.           Assessment & Plan:

## 2022-09-11 NOTE — Patient Instructions (Signed)
Follow up as needed or as scheduled I sent the prescription for the [place] and we will start the prior authorization Continue to work on healthy diet and regular exercise- you're doing great!!! Call with any questions or concerns Stay Safe!  Stay Healthy! Happy New Year!!!

## 2022-09-11 NOTE — Assessment & Plan Note (Signed)
Ongoing issue for pt.  He is down 5 lbs since last visit.  He is eating a balanced diet and walking daily.  He has an appt upcoming w/ [facility] but that is 9 months away.  His obesity is contributing to/a risk factor for- OSA, HTN, CHF, Hyperlipidemia and his health would be best served by losing weight.  In the past insurance has denied [place] and Ozempic.  Will try Saxenda to assist in his efforts.  Pt expressed understanding and is in agreement w/ plan.

## 2022-09-15 NOTE — Progress Notes (Signed)
Carelink Summary Report / Loop Recorder

## 2022-09-18 ENCOUNTER — Ambulatory Visit: Payer: Commercial Managed Care - HMO

## 2022-09-18 ENCOUNTER — Other Ambulatory Visit: Payer: Self-pay

## 2022-09-18 ENCOUNTER — Other Ambulatory Visit: Payer: Self-pay | Admitting: Family Medicine

## 2022-09-18 MED ORDER — SAXENDA 18 MG/3ML ~~LOC~~ SOPN: PEN_INJECTOR | SUBCUTANEOUS | 0 refills | Status: AC

## 2022-09-28 ENCOUNTER — Ambulatory Visit: Payer: Commercial Managed Care - HMO | Attending: Cardiovascular Disease

## 2022-09-28 DIAGNOSIS — I480 Paroxysmal atrial fibrillation: Secondary | ICD-10-CM

## 2022-09-29 ENCOUNTER — Ambulatory Visit: Payer: Commercial Managed Care - HMO | Attending: Cardiology | Admitting: *Deleted

## 2022-09-29 DIAGNOSIS — Z952 Presence of prosthetic heart valve: Secondary | ICD-10-CM | POA: Diagnosis not present

## 2022-09-29 DIAGNOSIS — Z7901 Long term (current) use of anticoagulants: Secondary | ICD-10-CM

## 2022-09-29 DIAGNOSIS — I4891 Unspecified atrial fibrillation: Secondary | ICD-10-CM | POA: Diagnosis not present

## 2022-09-29 LAB — CUP PACEART REMOTE DEVICE CHECK
Date Time Interrogation Session: 20240128231415
Implantable Pulse Generator Implant Date: 20221031

## 2022-09-29 LAB — POCT INR: INR: 6.6 — AB (ref 2.0–3.0)

## 2022-09-29 NOTE — Patient Instructions (Signed)
Description   Do not take any warfarin today and no warfarin tomorrow then continue taking warfarin 1 tablet daily except for 1/2 tablet on Monday, Wednesday and Friday. Started  Amio 200mg  BID x 1 month (05/18/2022) then QD. Coumadin Clinic [phone] or [phone];  INR 1 week.

## 2022-10-06 ENCOUNTER — Ambulatory Visit: Payer: Commercial Managed Care - HMO | Attending: Internal Medicine

## 2022-10-06 DIAGNOSIS — Z5181 Encounter for therapeutic drug level monitoring: Secondary | ICD-10-CM

## 2022-10-06 DIAGNOSIS — Z952 Presence of prosthetic heart valve: Secondary | ICD-10-CM | POA: Diagnosis not present

## 2022-10-06 DIAGNOSIS — I4891 Unspecified atrial fibrillation: Secondary | ICD-10-CM | POA: Diagnosis not present

## 2022-10-06 DIAGNOSIS — Z7901 Long term (current) use of anticoagulants: Secondary | ICD-10-CM | POA: Diagnosis not present

## 2022-10-06 LAB — POCT INR: INR: 2.3 (ref 2.0–3.0)

## 2022-10-06 NOTE — Patient Instructions (Signed)
TAKE 2 TABLETS TONIGHT ONLY THEN  continue taking warfarin 1 tablet daily except for 1/2 tablet on Monday, Wednesday and Friday. Started  Amio 200mg  BID x 1 month (05/18/2022) then QD. Coumadin Clinic [phone] or [phone];  INR 3 weeks.

## 2022-10-08 ENCOUNTER — Encounter (HOSPITAL_COMMUNITY): Payer: Self-pay | Admitting: *Deleted

## 2022-10-09 ENCOUNTER — Encounter: Payer: Self-pay | Admitting: Physician Assistant

## 2022-10-09 ENCOUNTER — Ambulatory Visit (INDEPENDENT_AMBULATORY_CARE_PROVIDER_SITE_OTHER): Payer: Commercial Managed Care - HMO

## 2022-10-09 ENCOUNTER — Ambulatory Visit: Payer: Commercial Managed Care - HMO | Admitting: Physician Assistant

## 2022-10-09 DIAGNOSIS — M25511 Pain in right shoulder: Secondary | ICD-10-CM | POA: Diagnosis not present

## 2022-10-09 DIAGNOSIS — M25512 Pain in left shoulder: Secondary | ICD-10-CM

## 2022-10-09 DIAGNOSIS — G8929 Other chronic pain: Secondary | ICD-10-CM | POA: Diagnosis not present
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
## 2022-10-30 ENCOUNTER — Ambulatory Visit: Payer: Commercial Managed Care - HMO

## 2022-11-02 ENCOUNTER — Ambulatory Visit (INDEPENDENT_AMBULATORY_CARE_PROVIDER_SITE_OTHER): Payer: 59

## 2022-11-02 ENCOUNTER — Other Ambulatory Visit: Payer: Self-pay | Admitting: Cardiology

## 2022-11-02 ENCOUNTER — Other Ambulatory Visit: Payer: Self-pay | Admitting: Family Medicine

## 2022-11-02 DIAGNOSIS — Z952 Presence of prosthetic heart valve: Secondary | ICD-10-CM

## 2022-11-02 DIAGNOSIS — I4891 Unspecified atrial fibrillation: Secondary | ICD-10-CM

## 2022-11-02 LAB — CUP PACEART REMOTE DEVICE CHECK
Date Time Interrogation Session: 20240303231118
Implantable Pulse Generator Implant Date: 20221031

## 2022-11-02 NOTE — Telephone Encounter (Signed)
Prescription refill request received for warfarin Lov: lambert 06/30/2022 Next INR check: 3/1 Warfarin tablet strength: '10mg'$    Pt is scheduled to come in tomorrow to have INR checked.

## 2022-11-05 ENCOUNTER — Ambulatory Visit: Payer: Commercial Managed Care - HMO | Attending: Internal Medicine | Admitting: *Deleted

## 2022-11-05 DIAGNOSIS — I4891 Unspecified atrial fibrillation: Secondary | ICD-10-CM | POA: Diagnosis not present

## 2022-11-05 DIAGNOSIS — Z7901 Long term (current) use of anticoagulants: Secondary | ICD-10-CM

## 2022-11-05 DIAGNOSIS — Z952 Presence of prosthetic heart valve: Secondary | ICD-10-CM

## 2022-11-05 LAB — POCT INR: INR: 5.2 — AB (ref 2.0–3.0)

## 2022-11-05 NOTE — Patient Instructions (Signed)
Description   Do not take any warfarin today then continue taking warfarin 1 tablet daily except for 1/2 tablet on Monday, Wednesday and Friday. Started Amio '200mg'$  BID x 1 month (05/18/2022) then QD. Coumadin Clinic [phone] or [phone];  Recheck INR 2 weeks.

## 2022-11-05 NOTE — Telephone Encounter (Signed)
LAST INR VISIT-TODAY 11/05/22 LAST OV 08/26/22 AFIB, AORTIC VALVE REPLACED WARFARIN '10MG'$ 

## 2022-11-10 NOTE — Progress Notes (Signed)
Carelink Summary Report / Loop Recorder 

## 2022-11-13 ENCOUNTER — Ambulatory Visit: Payer: Commercial Managed Care - HMO | Admitting: Family Medicine

## 2022-11-16 ENCOUNTER — Telehealth: Payer: Self-pay | Admitting: Family Medicine

## 2022-11-16 NOTE — Telephone Encounter (Signed)
Caller name: Disability Paperwork   On DPR?: Yes  Call back number: [phone] (home)  Provider they see: Midge Minium, MD  Reason for call:Placed in bin at front desk.

## 2022-11-17 NOTE — Telephone Encounter (Signed)
Placed in Dr [name] to be signed folder  

## 2022-11-18 ENCOUNTER — Telehealth (HOSPITAL_COMMUNITY): Payer: Self-pay

## 2022-11-18 NOTE — Telephone Encounter (Signed)
Forms waiting on MD signature

## 2022-11-19 ENCOUNTER — Ambulatory Visit: Payer: Commercial Managed Care - HMO | Attending: Cardiology

## 2022-11-20 ENCOUNTER — Encounter: Payer: Self-pay | Admitting: Family Medicine

## 2022-11-20 ENCOUNTER — Telehealth: Payer: Self-pay | Admitting: *Deleted

## 2022-11-20 ENCOUNTER — Ambulatory Visit: Payer: Commercial Managed Care - HMO | Admitting: Family Medicine

## 2022-11-20 ENCOUNTER — Other Ambulatory Visit: Payer: Self-pay | Admitting: Family Medicine

## 2022-11-20 VITALS — BP 110/70 | HR 66 | Temp 98.8°F | Resp 16 | Ht 71.0 in | Wt 259.4 lb

## 2022-11-20 DIAGNOSIS — J40 Bronchitis, not specified as acute or chronic: Secondary | ICD-10-CM | POA: Diagnosis not present

## 2022-11-20 MED ORDER — GUAIFENESIN-CODEINE 100-10 MG/5ML PO SYRP: 10.0000 mL | ORAL_SOLUTION | Freq: Three times a day (TID) | ORAL | 0 refills | Status: DC | PRN

## 2022-11-20 MED ORDER — ALBUTEROL SULFATE HFA 108 (90 BASE) MCG/ACT IN AERS: 2.0000 | INHALATION_SPRAY | Freq: Four times a day (QID) | RESPIRATORY_TRACT | 0 refills | Status: DC | PRN

## 2022-11-20 MED ORDER — DOXYCYCLINE HYCLATE 100 MG PO TABS: 100.0000 mg | ORAL_TABLET | Freq: Two times a day (BID) | ORAL | 0 refills | Status: DC

## 2022-11-20 NOTE — Progress Notes (Signed)
   Subjective:    Patient ID: LINWOOD FRIIS, male    DOB: [birth year], 62 y.o.   MRN: [id number]  HPI Cough- sxs started last week w/ cough and congestion.  Now wheezing.  Last fever was Wed- Tm 101.  Denies sinus pain/pressure.  No ear pain.  No body aches.  Cough is intermittently productive.  No known sick contacts but is sharing w/ his family.  Some SOB w/ stairs.   Review of Systems For ROS see HPI     Objective:   Physical Exam Vitals reviewed.  Constitutional:      General: He is not in acute distress.    Appearance: Normal appearance. He is not ill-appearing.  HENT:     Head: Normocephalic and atraumatic.     Right Ear: Tympanic membrane and ear canal normal.     Left Ear: Tympanic membrane and ear canal normal.     Nose: Congestion present. No rhinorrhea.     Comments: No TTP over frontal or maxillary sinuses    Mouth/Throat:     Pharynx: No oropharyngeal exudate or posterior oropharyngeal erythema.  Eyes:     Extraocular Movements: Extraocular movements intact.     Conjunctiva/sclera: Conjunctivae normal.     Pupils: Pupils are equal, round, and reactive to light.  Pulmonary:     Effort: Pulmonary effort is normal. No respiratory distress.     Breath sounds: Rhonchi (L sided) present.  Lymphadenopathy:     Cervical: No cervical adenopathy.  Skin:    General: Skin is warm and dry.  Neurological:     General: No focal deficit present.     Mental Status: He is alert and oriented to person, place, and time.  Psychiatric:        Mood and Affect: Mood normal.        Behavior: Behavior normal.        Thought Content: Thought content normal.           Assessment & Plan:  Bronchitis- new.  Pt has multiple underlying heart issues and deep, hacking cough w/ L sided rhonchi.  Will start Doxycycline, codeine cough syrup (ok to use w/ Amiodarone and coumadin), and albuterol prn.  Reviewed supportive care and red flags that should prompt return.  Pt expressed  understanding and is in agreement w/ plan.

## 2022-11-20 NOTE — Telephone Encounter (Signed)
Faxed and placed in Scan

## 2022-11-20 NOTE — Telephone Encounter (Signed)
These were actually a record request- Marcille Blanco is faxing to HIM

## 2022-11-20 NOTE — Telephone Encounter (Signed)
Pt due to have INR checked today at PCP. Pt did not get INR checked. Pt is starting doxy- first dose tonight. Informed pt that doxy can make INR go up and to eat a serving of greens. Scheduled pt to come in on 11/24/2022 to have INR checked.

## 2022-11-20 NOTE — Patient Instructions (Addendum)
Follow up as needed or as scheduled START the Doxycycline twice daily- take w/ food Use the cough syrup as needed- may cause drowsiness Use the albuterol as needed for cough or wheezing REST! Call with any questions or concerns Hang in there!!

## 2022-11-20 NOTE — Telephone Encounter (Signed)
Pharamcy is asking for a different inhaler insurance will not cover this one

## 2022-11-23 NOTE — Telephone Encounter (Signed)
Forms faxed on 11/19/22 to Julious Oka per patient request. My chart message sent to patient to inform him

## 2022-11-24 ENCOUNTER — Ambulatory Visit: Payer: Commercial Managed Care - HMO

## 2022-11-27 ENCOUNTER — Ambulatory Visit: Payer: Commercial Managed Care - HMO | Attending: Cardiovascular Disease | Admitting: *Deleted

## 2022-11-27 DIAGNOSIS — Z952 Presence of prosthetic heart valve: Secondary | ICD-10-CM

## 2022-11-27 DIAGNOSIS — Z5181 Encounter for therapeutic drug level monitoring: Secondary | ICD-10-CM

## 2022-11-27 DIAGNOSIS — I4891 Unspecified atrial fibrillation: Secondary | ICD-10-CM

## 2022-11-27 LAB — POCT INR: POC INR: 5.2

## 2022-11-27 NOTE — Patient Instructions (Signed)
Description   Do not take any warfarin today then continue taking warfarin 1 tablet daily except for 1/2 tablet on Monday, Wednesday and Friday. Started Amio 200mg  BID x 1 month (05/18/2022) then QD. Coumadin Clinic [phone] or [phone];  Recheck INR 1 week.

## 2022-12-01 ENCOUNTER — Telehealth: Payer: Self-pay | Admitting: Family Medicine

## 2022-12-01 NOTE — Telephone Encounter (Signed)
Charge sheet attached and placed in front bin

## 2022-12-02 NOTE — Telephone Encounter (Signed)
Faxed records release to medical records . As requested by [facility]

## 2022-12-03 ENCOUNTER — Telehealth: Payer: Self-pay | Admitting: *Deleted

## 2022-12-03 ENCOUNTER — Ambulatory Visit: Payer: Commercial Managed Care - HMO

## 2022-12-03 NOTE — Telephone Encounter (Signed)
Called pt since he missed Anticoagulation Appt today. There was no answer therefore left a message to call back to reschedule.

## 2022-12-04 ENCOUNTER — Ambulatory Visit: Payer: Commercial Managed Care - HMO | Attending: Cardiology | Admitting: Pharmacist

## 2022-12-04 DIAGNOSIS — Z952 Presence of prosthetic heart valve: Secondary | ICD-10-CM | POA: Diagnosis not present

## 2022-12-04 DIAGNOSIS — Z7901 Long term (current) use of anticoagulants: Secondary | ICD-10-CM | POA: Diagnosis not present

## 2022-12-04 DIAGNOSIS — I4891 Unspecified atrial fibrillation: Secondary | ICD-10-CM | POA: Diagnosis not present

## 2022-12-04 LAB — POCT INR: INR: 4.6 — AB (ref 2.0–3.0)

## 2022-12-04 NOTE — Patient Instructions (Signed)
Description   Do not take any warfarin today then reduce dose to 1/2 tablet on Sundays, Mondays, Wednesdays and Fridays. [place] Appl Amio 200mg  BID x 1 month (05/18/2022) then QD. Coumadin Clinic [phone] or [phone];  Recheck INR 1 week.

## 2022-12-06 ENCOUNTER — Other Ambulatory Visit: Payer: Self-pay | Admitting: Internal Medicine

## 2022-12-06 DIAGNOSIS — Z952 Presence of prosthetic heart valve: Secondary | ICD-10-CM

## 2022-12-06 DIAGNOSIS — I4891 Unspecified atrial fibrillation: Secondary | ICD-10-CM

## 2022-12-07 ENCOUNTER — Ambulatory Visit (INDEPENDENT_AMBULATORY_CARE_PROVIDER_SITE_OTHER): Payer: Commercial Managed Care - HMO

## 2022-12-07 DIAGNOSIS — I4891 Unspecified atrial fibrillation: Secondary | ICD-10-CM | POA: Diagnosis not present

## 2022-12-07 LAB — CUP PACEART REMOTE DEVICE CHECK
Date Time Interrogation Session: 20240405231033
Implantable Pulse Generator Implant Date: 20221031

## 2022-12-07 NOTE — Telephone Encounter (Signed)
Prescription refill request received for warfarin Lov: 08/26/22 ([facility])  Next INR check: 12/23/22 Warfarin tablet strength: 10mg   Appropriate dose. Refill sent.

## 2022-12-08 NOTE — Progress Notes (Signed)
Carelink Summary Report / Loop Recorder 

## 2022-12-09 ENCOUNTER — Other Ambulatory Visit: Payer: Self-pay | Admitting: Surgery

## 2022-12-09 DIAGNOSIS — I7121 Aneurysm of the ascending aorta, without rupture: Secondary | ICD-10-CM

## 2022-12-16 ENCOUNTER — Other Ambulatory Visit (HOSPITAL_COMMUNITY): Payer: Self-pay | Admitting: Internal Medicine

## 2022-12-23 ENCOUNTER — Ambulatory Visit: Payer: Commercial Managed Care - HMO | Attending: Internal Medicine

## 2022-12-23 ENCOUNTER — Telehealth: Payer: Self-pay | Admitting: *Deleted

## 2022-12-23 NOTE — Telephone Encounter (Signed)
Called pt since he missed his Anticoagulation Appt this morning. There was no answer therefore left a message to call back to reschedule.

## 2022-12-25 ENCOUNTER — Encounter: Payer: Self-pay | Admitting: Internal Medicine

## 2023-01-07 LAB — CUP PACEART REMOTE DEVICE CHECK
Date Time Interrogation Session: 20240508231325
Implantable Pulse Generator Implant Date: 20221031

## 2023-01-08 ENCOUNTER — Ambulatory Visit: Payer: Commercial Managed Care - HMO | Attending: Internal Medicine | Admitting: *Deleted

## 2023-01-08 ENCOUNTER — Telehealth: Payer: Self-pay | Admitting: *Deleted

## 2023-01-08 DIAGNOSIS — I4891 Unspecified atrial fibrillation: Secondary | ICD-10-CM | POA: Diagnosis not present

## 2023-01-08 DIAGNOSIS — Z952 Presence of prosthetic heart valve: Secondary | ICD-10-CM | POA: Diagnosis not present

## 2023-01-08 DIAGNOSIS — Z5181 Encounter for therapeutic drug level monitoring: Secondary | ICD-10-CM | POA: Diagnosis not present

## 2023-01-08 LAB — PROTIME-INR
INR: 6.3 (ref 0.9–1.2)
Prothrombin Time: 57.7 s — ABNORMAL HIGH (ref 9.1–12.0)

## 2023-01-08 LAB — POCT INR: POC INR: 7

## 2023-01-08 NOTE — Patient Instructions (Signed)
Description   Called and spoke to pt and instructed him to hold warfarin today and tomorrow, then continue to take Warfarin 1/2 a tablet daily except 1 tablet on Tuesday, Thursday and Saturday. Recheck INR in 1 week. Started Amio 200mg  BID x 1 month (05/18/2022) then QD. Coumadin Clinic [phone] or [phone];

## 2023-01-08 NOTE — Telephone Encounter (Signed)
Please see anticoag encounter from 01/08/2023 for further documentation.

## 2023-01-08 NOTE — Telephone Encounter (Signed)
Lab called with critical INR of 6.3 PT of 57.7

## 2023-01-09 ENCOUNTER — Other Ambulatory Visit: Payer: Self-pay | Admitting: Internal Medicine

## 2023-01-09 DIAGNOSIS — Z952 Presence of prosthetic heart valve: Secondary | ICD-10-CM

## 2023-01-09 DIAGNOSIS — I4891 Unspecified atrial fibrillation: Secondary | ICD-10-CM

## 2023-01-10 ENCOUNTER — Other Ambulatory Visit: Payer: Self-pay | Admitting: Family Medicine

## 2023-01-11 ENCOUNTER — Ambulatory Visit (INDEPENDENT_AMBULATORY_CARE_PROVIDER_SITE_OTHER): Payer: Commercial Managed Care - HMO

## 2023-01-11 ENCOUNTER — Encounter: Payer: Self-pay | Admitting: Internal Medicine

## 2023-01-11 ENCOUNTER — Other Ambulatory Visit: Payer: Self-pay

## 2023-01-11 DIAGNOSIS — I4891 Unspecified atrial fibrillation: Secondary | ICD-10-CM | POA: Diagnosis not present

## 2023-01-11 NOTE — Progress Notes (Signed)
Carelink Summary Report / Loop Recorder 

## 2023-01-11 NOTE — Telephone Encounter (Signed)
Pt states at his visit in Jan that he was not taking this medication . Is this ok to refill ?

## 2023-01-15 ENCOUNTER — Ambulatory Visit: Payer: Commercial Managed Care - HMO | Attending: Internal Medicine

## 2023-01-15 DIAGNOSIS — I4891 Unspecified atrial fibrillation: Secondary | ICD-10-CM

## 2023-01-15 DIAGNOSIS — Z7901 Long term (current) use of anticoagulants: Secondary | ICD-10-CM

## 2023-01-15 DIAGNOSIS — Z952 Presence of prosthetic heart valve: Secondary | ICD-10-CM | POA: Diagnosis not present

## 2023-01-15 LAB — POCT INR: INR: 4 — AB (ref 2.0–3.0)

## 2023-01-15 NOTE — Patient Instructions (Signed)
continue to take Warfarin 1/2 a tablet daily except 1 tablet on Tuesday, Thursday and Saturday. Recheck INR in 3 weeks. Started Amio 200mg  BID x 1 month (05/18/2022) then QD. Coumadin Clinic [phone] or [phone];

## 2023-01-20 ENCOUNTER — Ambulatory Visit (INDEPENDENT_AMBULATORY_CARE_PROVIDER_SITE_OTHER): Payer: Commercial Managed Care - HMO | Admitting: Surgery

## 2023-01-20 ENCOUNTER — Encounter: Payer: Self-pay | Admitting: Surgery

## 2023-01-20 ENCOUNTER — Ambulatory Visit
Admission: RE | Admit: 2023-01-20 | Discharge: 2023-01-20 | Disposition: A | Discharge status: Home / Self Care | Payer: Commercial Managed Care - HMO | Source: Ambulatory Visit | Attending: Surgery | Admitting: Surgery

## 2023-01-20 VITALS — BP 130/80 | HR 56 | Resp 20 | Wt 252.0 lb

## 2023-01-20 DIAGNOSIS — I7121 Aneurysm of the ascending aorta, without rupture: Secondary | ICD-10-CM

## 2023-01-20 MED ORDER — IOPAMIDOL (ISOVUE-370) INJECTION 76%
75.0000 mL | Freq: Once | INTRAVENOUS | Status: AC | PRN
  Administered 2023-01-20: 75 mL via INTRAVENOUS

## 2023-01-20 NOTE — Progress Notes (Signed)
     HPI: ***  Current Outpatient Medications  Medication Sig Dispense Refill   albuterol (VENTOLIN HFA) 108 (90 Base) MCG/ACT inhaler INHALE 2 PUFFS BY MOUTH EVERY 6 HOURS AS NEEDED FOR WHEEZE OR SHORTNESS OF BREATH 18 each 0   amiodarone (PACERONE) 200 MG tablet Take 1 tablet by mouth twice a day for 1 month then reduce to 1 tablet daily 90 tablet 3   aspirin EC 81 MG tablet Take 81 mg by mouth at bedtime.      carvedilol (COREG) 6.25 MG tablet Take 1 tablet by mouth 2 (two) times daily.     dapagliflozin propanediol (FARXIGA) 10 MG TABS tablet Take 1 tablet (10 mg total) by mouth daily before breakfast. 90 tablet 3   doxycycline (VIBRA-TABS) 100 MG tablet Take 1 tablet (100 mg total) by mouth 2 (two) times daily. 20 tablet 0   furosemide (LASIX) 20 MG tablet TAKE 1 TABLET BY MOUTH EVERY DAY 30 tablet 5   ibuprofen (ADVIL) 200 MG tablet Take 600 mg by mouth every 6 (six) hours as needed for moderate pain or headache.     lisinopril (ZESTRIL) 40 MG tablet Take 1 tablet by mouth daily.     pantoprazole (PROTONIX) 40 MG tablet TAKE 1 TABLET BY MOUTH EVERY DAY 30 tablet 1   simvastatin (ZOCOR) 20 MG tablet TAKE 1 TABLET BY MOUTH EVERYDAY AT BEDTIME 90 tablet 1   spironolactone (ALDACTONE) 25 MG tablet TAKE 1 TABLET BY MOUTH EVERY DAY 90 tablet 3   verapamil (VERELAN PM) 120 MG 24 hr capsule Take 120 mg by mouth at bedtime.     warfarin (COUMADIN) 10 MG tablet TAKE 1/2 TO 1 TABLET BY MOUTH DAILY AS DIRECTED BY COUMADIN CLINIC 90 tablet 1   No current facility-administered medications for this visit.     Physical Exam: ***  Diagnostic Tests: ***  Impression: ***  Plan: ***   Alleen Borne, MD [facility] [phone]

## 2023-01-28 ENCOUNTER — Other Ambulatory Visit: Payer: Self-pay | Admitting: Family Medicine

## 2023-01-29 ENCOUNTER — Other Ambulatory Visit (HOSPITAL_COMMUNITY): Payer: Self-pay | Admitting: *Deleted

## 2023-01-29 ENCOUNTER — Other Ambulatory Visit: Payer: Self-pay

## 2023-01-29 MED ORDER — ALBUTEROL SULFATE HFA 108 (90 BASE) MCG/ACT IN AERS: INHALATION_SPRAY | RESPIRATORY_TRACT | 1 refills | Status: DC

## 2023-01-29 MED ORDER — AMIODARONE HCL 200 MG PO TABS: 200.0000 mg | ORAL_TABLET | Freq: Every day | ORAL | 1 refills | Status: DC

## 2023-02-03 ENCOUNTER — Other Ambulatory Visit: Payer: Self-pay

## 2023-02-03 MED ORDER — FUROSEMIDE 20 MG PO TABS: 20.0000 mg | ORAL_TABLET | Freq: Every day | ORAL | 5 refills | Status: DC

## 2023-02-03 NOTE — Progress Notes (Signed)
Carelink Summary Report / Loop Recorder 

## 2023-02-05 ENCOUNTER — Ambulatory Visit: Payer: Commercial Managed Care - HMO | Attending: Internal Medicine

## 2023-02-05 DIAGNOSIS — I4891 Unspecified atrial fibrillation: Secondary | ICD-10-CM | POA: Diagnosis not present

## 2023-02-05 DIAGNOSIS — Z952 Presence of prosthetic heart valve: Secondary | ICD-10-CM | POA: Diagnosis not present

## 2023-02-05 DIAGNOSIS — Z7901 Long term (current) use of anticoagulants: Secondary | ICD-10-CM | POA: Diagnosis not present

## 2023-02-05 LAB — POCT INR: INR: 5.8 — AB (ref 2.0–3.0)

## 2023-02-05 NOTE — Patient Instructions (Signed)
HOLD TODAY, SATURDAY and SUNDAY THEN decrease to 1/2 a tablet daily except 1 tablet on Wednesday. Recheck INR in 1 weeks. Started Amio 200mg  BID x 1 month (05/18/2022) then QD. Coumadin Clinic [phone] or [phone];

## 2023-02-09 ENCOUNTER — Ambulatory Visit: Payer: Commercial Managed Care - HMO

## 2023-02-09 DIAGNOSIS — I480 Paroxysmal atrial fibrillation: Secondary | ICD-10-CM

## 2023-02-09 LAB — CUP PACEART REMOTE DEVICE CHECK
Date Time Interrogation Session: 20240610230702
Implantable Pulse Generator Implant Date: 20221031

## 2023-02-12 ENCOUNTER — Ambulatory Visit: Payer: Commercial Managed Care - HMO | Attending: Internal Medicine

## 2023-02-12 DIAGNOSIS — I4891 Unspecified atrial fibrillation: Secondary | ICD-10-CM | POA: Diagnosis not present

## 2023-02-12 DIAGNOSIS — Z952 Presence of prosthetic heart valve: Secondary | ICD-10-CM

## 2023-02-12 DIAGNOSIS — Z7901 Long term (current) use of anticoagulants: Secondary | ICD-10-CM | POA: Diagnosis not present

## 2023-02-12 LAB — POCT INR: INR: 3.3 — AB (ref 2.0–3.0)

## 2023-02-12 NOTE — Patient Instructions (Signed)
Decrease to 1/2 a tablet daily. Greens x 2 per week;  Recheck INR in 2 weeks. Started Amio 200mg  BID x 1 month (05/18/2022) then QD. Coumadin Clinic [phone] or [phone];

## 2023-02-13 DIAGNOSIS — I480 Paroxysmal atrial fibrillation: Secondary | ICD-10-CM

## 2023-02-15 ENCOUNTER — Encounter: Payer: Self-pay | Admitting: Family Medicine

## 2023-02-16 ENCOUNTER — Other Ambulatory Visit: Payer: Self-pay | Admitting: Internal Medicine

## 2023-02-18 ENCOUNTER — Encounter: Payer: Self-pay | Admitting: Family Medicine

## 2023-02-18 ENCOUNTER — Ambulatory Visit (INDEPENDENT_AMBULATORY_CARE_PROVIDER_SITE_OTHER): Payer: Commercial Managed Care - HMO | Admitting: Family Medicine

## 2023-02-18 VITALS — BP 116/78 | HR 56 | Temp 98.0°F | Resp 18 | Ht 71.0 in | Wt 251.1 lb

## 2023-02-18 DIAGNOSIS — M7042 Prepatellar bursitis, left knee: Secondary | ICD-10-CM | POA: Diagnosis not present

## 2023-02-18 DIAGNOSIS — Z1211 Encounter for screening for malignant neoplasm of colon: Secondary | ICD-10-CM

## 2023-02-18 DIAGNOSIS — M25562 Pain in left knee: Secondary | ICD-10-CM | POA: Diagnosis not present

## 2023-02-18 MED ORDER — PREDNISONE 10 MG PO TABS: ORAL_TABLET | ORAL | 0 refills | Status: DC

## 2023-02-18 NOTE — Progress Notes (Signed)
   Subjective:    Patient ID: Mark [name], male    DOB: 1960-11-22, 62 y.o.   MRN: [id number]  HPI L knee pain- pt has hx of pre-patellar bursitis.  Area was previously not painful but is now causing pain.  Fell in April and has had pain since.  Has not been taking any OTC meds b/c on Coumadin.  Having a lot of pain and difficulty w/ stairs.  Is icing for pain relief.  Pt denies redness or warmth   Review of Systems For ROS see HPI     Objective:   Physical Exam Vitals reviewed.  Constitutional:      General: He is not in acute distress.    Appearance: Normal appearance. He is not ill-appearing.  HENT:     Head: Normocephalic and atraumatic.  Musculoskeletal:        General: Deformity (large L prepatellar swelling, TTP) present.  Skin:    General: Skin is warm and dry.     Findings: No erythema.  Neurological:     General: No focal deficit present.     Mental Status: He is alert and oriented to person, place, and time.  Psychiatric:        Mood and Affect: Mood normal.        Behavior: Behavior normal.        Thought Content: Thought content normal.           Assessment & Plan:   L knee pain/prepatellar bursitis- deteriorated.  Pt has had a non tender, painless swelling of L prepatellar bursa for awhile, but after falling in April this has been painful.  Having a hard time going up and down stairs which is required for his job.  Given his Coumadin use, has been unable to take NSAIDs.  Will start Prednisone and refer to Ortho for complete evaluation.  Pt expressed understanding and is in agreement w/ plan.   Colon cancer screen- due for repeat cologuard.  Ordered.

## 2023-02-18 NOTE — Patient Instructions (Addendum)
Follow up as needed or as scheduled START the Prednisone as directed- 3 pills at the same time x3 days, then 2 pills at the same time x3 days, then 1 pill daily.  Take w/ food  We'll call you to schedule your Ortho appt [facility]!!! Try and rest it when possible  Complete and return the Cologuard as directed Call with any questions or concerns Hang in there!!!

## 2023-02-19 ENCOUNTER — Other Ambulatory Visit (HOSPITAL_COMMUNITY): Payer: Self-pay | Admitting: *Deleted

## 2023-02-19 ENCOUNTER — Ambulatory Visit (HOSPITAL_COMMUNITY)
Admission: RE | Admit: 2023-02-19 | Discharge: 2023-02-19 | Disposition: A | Discharge status: Home / Self Care | Payer: Commercial Managed Care - HMO | Source: Ambulatory Visit | Attending: Internal Medicine | Admitting: Internal Medicine

## 2023-02-19 VITALS — BP 104/70 | HR 48 | Ht 71.0 in | Wt 248.8 lb

## 2023-02-19 DIAGNOSIS — I4891 Unspecified atrial fibrillation: Secondary | ICD-10-CM | POA: Insufficient documentation

## 2023-02-19 DIAGNOSIS — G4733 Obstructive sleep apnea (adult) (pediatric): Secondary | ICD-10-CM | POA: Diagnosis not present

## 2023-02-19 DIAGNOSIS — I480 Paroxysmal atrial fibrillation: Secondary | ICD-10-CM

## 2023-02-19 DIAGNOSIS — I4819 Other persistent atrial fibrillation: Secondary | ICD-10-CM | POA: Diagnosis not present

## 2023-02-19 DIAGNOSIS — Z7901 Long term (current) use of anticoagulants: Secondary | ICD-10-CM | POA: Diagnosis not present

## 2023-02-19 DIAGNOSIS — Z5181 Encounter for therapeutic drug level monitoring: Secondary | ICD-10-CM | POA: Diagnosis not present

## 2023-02-19 DIAGNOSIS — Z79899 Other long term (current) drug therapy: Secondary | ICD-10-CM

## 2023-02-19 DIAGNOSIS — I110 Hypertensive heart disease with heart failure: Secondary | ICD-10-CM | POA: Insufficient documentation

## 2023-02-19 DIAGNOSIS — I509 Heart failure, unspecified: Secondary | ICD-10-CM | POA: Diagnosis not present

## 2023-02-19 DIAGNOSIS — D6869 Other thrombophilia: Secondary | ICD-10-CM | POA: Insufficient documentation

## 2023-02-19 LAB — TSH: TSH: 2.084 u[IU]/mL (ref 0.350–4.500)

## 2023-02-19 LAB — COMPREHENSIVE METABOLIC PANEL
ALT: 36 U/L (ref 0–44)
AST: 25 U/L (ref 15–41)
Albumin: 4.2 g/dL (ref 3.5–5.0)
Alkaline Phosphatase: 55 U/L (ref 38–126)
Anion gap: 9 (ref 5–15)
BUN: 30 mg/dL — ABNORMAL HIGH (ref 6–20)
CO2: 22 mmol/L (ref 22–32)
Calcium: 9.3 mg/dL (ref 8.9–10.3)
Chloride: 104 mmol/L (ref 98–111)
Creatinine, Ser: 1.72 mg/dL — ABNORMAL HIGH (ref 0.61–1.24)
GFR, Estimated: 45 mL/min — ABNORMAL LOW (ref >60)
Glucose, Bld: 100 mg/dL — ABNORMAL HIGH (ref 70–99)
Potassium: 4.5 mmol/L (ref 3.5–5.1)
Sodium: 135 mmol/L (ref 135–145)
Total Bilirubin: 1.2 mg/dL (ref 0.3–1.2)
Total Protein: 7.2 g/dL (ref 6.5–8.1)

## 2023-02-19 LAB — CBC
HCT: 43.6 % (ref 39.0–52.0)
Hemoglobin: 14.3 g/dL (ref 13.0–17.0)
MCH: 31.6 pg (ref 26.0–34.0)
MCHC: 32.8 g/dL (ref 30.0–36.0)
MCV: 96.2 fL (ref 80.0–100.0)
Platelets: 194 10*3/uL (ref 150–400)
RBC: 4.53 MIL/uL (ref 4.22–5.81)
RDW: 13 % (ref 11.5–15.5)
WBC: 6.1 10*3/uL (ref 4.0–10.5)
nRBC: 0 % (ref 0.0–0.2)
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
## 2023-02-25 ENCOUNTER — Encounter: Payer: Self-pay | Admitting: Family Medicine

## 2023-02-25 ENCOUNTER — Ambulatory Visit: Payer: Commercial Managed Care - HMO | Admitting: Family Medicine

## 2023-02-25 ENCOUNTER — Telehealth: Payer: Self-pay | Admitting: *Deleted

## 2023-02-25 VITALS — BP 128/68 | HR 58 | Temp 98.0°F | Resp 18 | Ht 71.0 in | Wt 245.1 lb

## 2023-02-25 DIAGNOSIS — R6889 Other general symptoms and signs: Secondary | ICD-10-CM | POA: Diagnosis not present

## 2023-02-25 DIAGNOSIS — Z1152 Encounter for screening for COVID-19: Secondary | ICD-10-CM

## 2023-02-25 DIAGNOSIS — U071 COVID-19: Secondary | ICD-10-CM | POA: Diagnosis not present

## 2023-02-25 LAB — POCT INFLUENZA A/B
Influenza A, POC: NEGATIVE
Influenza B, POC: NEGATIVE

## 2023-02-25 LAB — POC COVID19 BINAXNOW: SARS Coronavirus 2 Ag: POSITIVE — AB

## 2023-02-25 MED ORDER — PROMETHAZINE-DM 6.25-15 MG/5ML PO SYRP: 5.0000 mL | ORAL_SOLUTION | Freq: Four times a day (QID) | ORAL | 0 refills | Status: DC | PRN

## 2023-02-25 NOTE — Patient Instructions (Signed)
Follow up as needed or as scheduled USE the cough syrup as needed Drink LOTS of fluids REST!! Vit D, Vit C, and Zinc will all boost immune system Call with any questions or concerns- particularly if symptoms change or worsen Hang in there!!!

## 2023-02-25 NOTE — Telephone Encounter (Signed)
Pt left a voicemail to return a call to him.    Returned call to pt and there was no answer. Left a message to
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
## 2023-02-26 ENCOUNTER — Ambulatory Visit: Payer: Commercial Managed Care - HMO

## 2023-03-01 ENCOUNTER — Other Ambulatory Visit: Payer: Self-pay | Admitting: Family Medicine

## 2023-03-09 ENCOUNTER — Ambulatory Visit: Payer: Commercial Managed Care - HMO | Attending: Cardiology

## 2023-03-09 DIAGNOSIS — Z7901 Long term (current) use of anticoagulants: Secondary | ICD-10-CM | POA: Diagnosis not present

## 2023-03-09 DIAGNOSIS — I4891 Unspecified atrial fibrillation: Secondary | ICD-10-CM | POA: Diagnosis not present

## 2023-03-09 DIAGNOSIS — Z952 Presence of prosthetic heart valve: Secondary | ICD-10-CM | POA: Diagnosis not present

## 2023-03-09 LAB — COLOGUARD: COLOGUARD: POSITIVE — AB

## 2023-03-09 LAB — POCT INR: INR: 4.6 — AB (ref 2.0–3.0)

## 2023-03-09 NOTE — Progress Notes (Signed)
Carelink Summary Report / Loop Recorder 

## 2023-03-09 NOTE — Patient Instructions (Signed)
Continue 1/2  tablet daily. Greens x 2 per week;  Recheck INR in 4 weeks. Started Amio 200mg  BID x 1 month (05/18/2022) then QD. Coumadin Clinic [phone] or [phone];

## 2023-03-10 ENCOUNTER — Telehealth: Payer: Self-pay

## 2023-03-10 ENCOUNTER — Other Ambulatory Visit: Payer: Self-pay

## 2023-03-10 DIAGNOSIS — R195 Other fecal abnormalities: Secondary | ICD-10-CM

## 2023-03-10 NOTE — Telephone Encounter (Signed)
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  This doesn't mean you have colon cancer but it does mean that we need to refer you to GI for a complete evaluation (dx + cologuard).  GI and cardiology will decide (together) about colonoscopy and your blood thinners, etc.  We will place that referral and someone will be in touch

## 2023-03-10 NOTE — Telephone Encounter (Signed)
Pt is aware of the [facility] results .   GI referral has been placed

## 2023-03-15 ENCOUNTER — Ambulatory Visit: Payer: Commercial Managed Care - HMO

## 2023-03-15 DIAGNOSIS — I4891 Unspecified atrial fibrillation: Secondary | ICD-10-CM

## 2023-03-15 LAB — CUP PACEART REMOTE DEVICE CHECK
Date Time Interrogation Session: 20240713230602
Implantable Pulse Generator Implant Date: 20221031

## 2023-03-16 ENCOUNTER — Encounter: Payer: Self-pay | Admitting: Emergency Medicine

## 2023-03-16 ENCOUNTER — Ambulatory Visit
Admission: EM | Admit: 2023-03-16 | Discharge: 2023-03-16 | Disposition: A | Discharge status: Home / Self Care | Payer: Commercial Managed Care - HMO | Attending: Family Medicine | Admitting: Family Medicine

## 2023-03-16 ENCOUNTER — Other Ambulatory Visit: Payer: Self-pay

## 2023-03-16 ENCOUNTER — Other Ambulatory Visit: Payer: Self-pay | Admitting: Orthopedic Surgery

## 2023-03-16 DIAGNOSIS — T148XXA Other injury of unspecified body region, initial encounter: Secondary | ICD-10-CM

## 2023-03-16 DIAGNOSIS — S8991XA Unspecified injury of right lower leg, initial encounter: Secondary | ICD-10-CM

## 2023-03-16 DIAGNOSIS — L089 Local infection of the skin and subcutaneous tissue, unspecified: Secondary | ICD-10-CM

## 2023-03-16 MED ORDER — AMOXICILLIN-POT CLAVULANATE 875-125 MG PO TABS: 1.0000 | ORAL_TABLET | Freq: Two times a day (BID) | ORAL | 0 refills | Status: DC

## 2023-03-16 NOTE — Discharge Instructions (Addendum)
Change dressing twice daily and apply neosporin to wound twice daily. Take Augmentin twice daily for 7 days for prophylaxis against skin infection.

## 2023-03-16 NOTE — ED Triage Notes (Signed)
Pt hit lower right leg on trailer yesterday; bruising and some dried blood noted; pt is on coumadin; denies large amount of bleeding yesterday; pt sts is painful
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
## 2023-03-17 ENCOUNTER — Telehealth: Payer: Self-pay | Admitting: *Deleted

## 2023-03-17 NOTE — Telephone Encounter (Signed)
Pt called to report he is taking an antibiotic called augmentin. Advised that the med does not interact with warfarin & is safe to take. He verbalized understanding.

## 2023-03-18 ENCOUNTER — Encounter: Payer: Self-pay | Admitting: Gastroenterology

## 2023-03-26 NOTE — Progress Notes (Signed)
Carelink Summary Report / Loop Recorder 

## 2023-04-09 ENCOUNTER — Ambulatory Visit: Payer: Commercial Managed Care - HMO | Attending: Internal Medicine

## 2023-04-09 DIAGNOSIS — Z7901 Long term (current) use of anticoagulants: Secondary | ICD-10-CM | POA: Diagnosis not present

## 2023-04-09 DIAGNOSIS — Z952 Presence of prosthetic heart valve: Secondary | ICD-10-CM | POA: Diagnosis not present

## 2023-04-09 DIAGNOSIS — I4891 Unspecified atrial fibrillation: Secondary | ICD-10-CM | POA: Diagnosis not present

## 2023-04-09 LAB — POCT INR: INR: 2.2 (ref 2.0–3.0)

## 2023-04-09 NOTE — Patient Instructions (Addendum)
DUE TO COVID-19 ONLY TWO VISITORS  (aged 60 and older)  ARE ALLOWED TO COME WITH YOU AND STAY IN THE WAITING ROOM ONLY DURING PRE OP AND PROCEDURE.   **NO VISITORS ARE ALLOWED IN THE SHORT STAY AREA OR RECOVERY ROOM!!**  IF YOU WILL BE ADMITTED INTO THE HOSPITAL YOU ARE ALLOWED ONLY FOUR SUPPORT PEOPLE DURING VISITATION HOURS ONLY (7 AM -8PM)   The support person(s) must pass our screening, gel in and out, and wear a mask at all times, including in the patient's room. Patients must also wear a mask when staff or their support person are in the room. Visitors GUEST BADGE MUST BE WORN VISIBLY  One adult visitor may remain with you overnight and MUST be in the room by 8 P.M.     Your procedure is scheduled on: 04/26/23   Report to [facility] Main Entrance    Report to admitting at : 3:00 PM   Call this number if you have problems the morning of surgery [phone]   Do not eat food :After Midnight.   After Midnight you may have the following liquids until : 2:00 PM DAY OF SURGERY  Water Black Coffee (sugar ok, NO MILK/CREAM OR CREAMERS)  Tea (sugar ok, NO MILK/CREAM OR CREAMERS) regular and decaf                             Plain Jell-O (NO RED)                                           Fruit ices (not with fruit pulp, NO RED)                                     Popsicles (NO RED)                                                                  Juice: apple, WHITE grape, WHITE cranberry Sports drinks like Gatorade (NO RED)   The day of surgery:  Drink ONE (1) Pre-Surgery Clear Ensure at : 2:00 PM the morning of surgery. Drink in one sitting. Do not sip.  This drink was given to you during your hospital  pre-op appointment visit. Nothing else to drink after completing the  Pre-Surgery Clear Ensure or G2.          If you have questions, please contact your surgeon's office.  FOLLOW ANY ADDITIONAL PRE OP INSTRUCTIONS YOU RECEIVED FROM YOUR SURGEON'S OFFICE!!!  Oral  Hygiene is also important to reduce your risk of infection.                                    Remember - BRUSH YOUR TEETH THE MORNING OF SURGERY WITH YOUR REGULAR TOOTHPASTE  DENTURES WILL BE REMOVED PRIOR TO SURGERY PLEASE DO NOT APPLY "Poly grip" OR ADHESIVES!!!   Do NOT smoke after Midnight   Take these medicines the morning of surgery with  A SIP OF WATER: amiodarone(pacerone),carvedilol,farxiga.  Bring CPAP mask and tubing day of surgery.                              You may not have any metal on your body including hair pins, jewelry, and body piercing             Do not wear lotions, powders, perfumes/cologne, or deodorant              Men may shave face and neck.   Do not bring valuables to the hospital. [name] IS NOT             RESPONSIBLE   FOR VALUABLES.   Contacts, glasses, or bridgework may not be worn into surgery.   Bring small overnight bag day of surgery.   DO NOT BRING YOUR HOME MEDICATIONS TO THE HOSPITAL. PHARMACY WILL DISPENSE MEDICATIONS LISTED ON YOUR MEDICATION LIST TO YOU DURING YOUR ADMISSION IN THE HOSPITAL!    Patients discharged on the day of surgery will not be allowed to drive home.  Someone NEEDS to stay with you for the first 24 hours after anesthesia.   Special Instructions: Bring a copy of your healthcare power of attorney and living will documents         the day of surgery if you haven't scanned them before.              Please read over the following fact sheets you were given: IF YOU HAVE QUESTIONS ABOUT YOUR PRE-OP INSTRUCTIONS PLEASE CALL [phone]    [facility] - Preparing for Surgery Before surgery, you can play an important role.  Because skin is not sterile, your skin needs to be as free of germs as possible.  You can reduce the number of germs on your skin by washing with CHG (chlorahexidine gluconate) soap before surgery.  CHG is an antiseptic cleaner which kills germs and bonds with the skin to continue killing germs even  after washing. Please DO NOT use if you have an allergy to CHG or antibacterial soaps.  If your skin becomes reddened/irritated stop using the CHG and inform your nurse when you arrive at Short Stay. Do not shave (including legs and underarms) for at least 48 hours prior to the first CHG shower.  You may shave your face/neck. Please follow these instructions carefully:  1.  Shower with CHG Soap the night before surgery and the  morning of Surgery.  2.  If you choose to wash your hair, wash your hair first as usual with your  normal  shampoo.  3.  After you shampoo, rinse your hair and body thoroughly to remove the  shampoo.                           4.  Use CHG as you would any other liquid soap.  You can apply chg directly  to the skin and wash                       Gently with a scrungie or clean washcloth.  5.  Apply the CHG Soap to your body ONLY FROM THE NECK DOWN.   Do not use on face/ open                           Wound  or open sores. Avoid contact with eyes, ears mouth and genitals (private parts).                       Wash face,  Genitals (private parts) with your normal soap.             6.  Wash thoroughly, paying special attention to the area where your surgery  will be performed.  7.  Thoroughly rinse your body with warm water from the neck down.  8.  DO NOT shower/wash with your normal soap after using and rinsing off  the CHG Soap.                9.  Pat yourself dry with a clean towel.            10.  Wear clean pajamas.            11.  Place clean sheets on your bed the night of your first shower and do not  sleep with pets. Day of Surgery : Do not apply any lotions/deodorants the morning of surgery.  Please wear clean clothes to the hospital/surgery center.  FAILURE TO FOLLOW THESE INSTRUCTIONS MAY RESULT IN THE CANCELLATION OF YOUR SURGERY PATIENT SIGNATURE_________________________________  NURSE  SIGNATURE__________________________________  ________________________________________________________________________  Mark [name]  An incentive spirometer is a tool that can help keep your lungs clear and active. This tool measures how well you are filling your lungs with each breath. Taking long deep breaths may help reverse or decrease the chance of developing breathing (pulmonary) problems (especially infection) following: A long period of time when you are unable to move or be active. BEFORE THE PROCEDURE  If the spirometer includes an indicator to show your best effort, your nurse or respiratory therapist will set it to a desired goal. If possible, sit up straight or lean slightly forward. Try not to slouch. Hold the incentive spirometer in an upright position. INSTRUCTIONS FOR USE  Sit on the edge of your bed if possible, or sit up as far as you can in bed or on a chair. Hold the incentive spirometer in an upright position. Breathe out normally. Place the mouthpiece in your mouth and seal your lips tightly around it. Breathe in slowly and as deeply as possible, raising the piston or the ball toward the top of the column. Hold your breath for 3-5 seconds or for as long as possible. Allow the piston or ball to fall to the bottom of the column. Remove the mouthpiece from your mouth and breathe out normally. Rest for a few seconds and repeat Steps 1 through 7 at least 10 times every 1-2 hours when you are awake. Take your time and take a few normal breaths between deep breaths. The spirometer may include an indicator to show your best effort. Use the indicator as a goal to work toward during each repetition. After each set of 10 deep breaths, practice coughing to be sure your lungs are clear. If you have an incision (the cut made at the time of surgery), support your incision when coughing by placing a pillow or rolled up towels firmly against it. Once you are able to get out of  bed, walk around indoors and cough well. You may stop using the incentive spirometer when instructed by your caregiver.  RISKS AND COMPLICATIONS Take your time so you do not get dizzy or light-headed. If you are in pain, you may need to take or ask for  pain medication before doing incentive spirometry. It is harder to take a deep breath if you are having pain. AFTER USE Rest and breathe slowly and easily. It can be helpful to keep track of a log of your progress. Your caregiver can provide you with a simple table to help with this. If you are using the spirometer at home, follow these instructions: SEEK MEDICAL CARE IF:  You are having difficultly using the spirometer. You have trouble using the spirometer as often as instructed. Your pain medication is not giving enough relief while using the spirometer. You develop fever of 100.5 F (38.1 C) or higher. SEEK IMMEDIATE MEDICAL CARE IF:  You cough up bloody sputum that had not been present before. You develop fever of 102 F (38.9 C) or greater. You develop worsening pain at or near the incision site. MAKE SURE YOU:  Understand these instructions. Will watch your condition. Will get help right away if you are not doing well or get worse. Document Released: 12/28/2006 Document Revised: 11/09/2011 Document Reviewed: 02/28/2007 [facility] Patient Information 2014 [place], [place].   ________________________________________________________________________

## 2023-04-09 NOTE — Patient Instructions (Signed)
Take 1.5 tablets today only then Continue 1/2  tablet daily. Greens x 2 per week;  Recheck INR in 2 weeks. Started Amio 200mg  BID x 1 month (05/18/2022) then QD. Coumadin Clinic [phone] or [phone];  Surgery upcoming 8/26, awaiting clearance form Fax to [phone]

## 2023-04-12 ENCOUNTER — Other Ambulatory Visit: Payer: Self-pay | Admitting: Family Medicine

## 2023-04-12 ENCOUNTER — Other Ambulatory Visit: Payer: Self-pay | Admitting: Orthopedic Surgery

## 2023-04-12 DIAGNOSIS — M25511 Pain in right shoulder: Secondary | ICD-10-CM

## 2023-04-13 ENCOUNTER — Encounter (HOSPITAL_COMMUNITY)
Admission: RE | Admit: 2023-04-13 | Discharge: 2023-04-13 | Disposition: A | Discharge status: Home / Self Care | Payer: Commercial Managed Care - HMO | Source: Ambulatory Visit | Attending: Anesthesiology | Admitting: Anesthesiology

## 2023-04-13 DIAGNOSIS — Z7901 Long term (current) use of anticoagulants: Secondary | ICD-10-CM

## 2023-04-13 DIAGNOSIS — I10 Essential (primary) hypertension: Secondary | ICD-10-CM

## 2023-04-13 NOTE — Progress Notes (Signed)
Pt. Did not show up for PST appointment today.Several attempts to reach pt. Over the phone were tried, unsuccessfully.Message was left on pt's phone to contact PST office when he gets the message.

## 2023-04-16 ENCOUNTER — Telehealth: Payer: Self-pay | Admitting: Urology

## 2023-04-16 NOTE — Telephone Encounter (Signed)
Waiting for records from other office before scheduling.

## 2023-04-19 ENCOUNTER — Ambulatory Visit: Payer: Commercial Managed Care - HMO

## 2023-04-19 ENCOUNTER — Ambulatory Visit (INDEPENDENT_AMBULATORY_CARE_PROVIDER_SITE_OTHER): Payer: Commercial Managed Care - HMO

## 2023-04-19 DIAGNOSIS — I4891 Unspecified atrial fibrillation: Secondary | ICD-10-CM | POA: Diagnosis not present

## 2023-04-19 LAB — CUP PACEART REMOTE DEVICE CHECK
Date Time Interrogation Session: 20240818231822
Implantable Pulse Generator Implant Date: 20221031

## 2023-04-20 ENCOUNTER — Telehealth: Payer: Self-pay | Admitting: *Deleted

## 2023-04-20 ENCOUNTER — Ambulatory Visit
Admission: RE | Admit: 2023-04-20 | Discharge: 2023-04-20 | Disposition: A | Discharge status: Home / Self Care | Payer: Commercial Managed Care - HMO | Source: Ambulatory Visit | Attending: Orthopedic Surgery | Admitting: Orthopedic Surgery

## 2023-04-20 ENCOUNTER — Ambulatory Visit: Payer: Commercial Managed Care - HMO

## 2023-04-20 DIAGNOSIS — M25511 Pain in right shoulder: Secondary | ICD-10-CM

## 2023-04-20 NOTE — Telephone Encounter (Signed)
Called pt since he missed his another [facility] appt. Was able to get him rescheduled again as he is going out of town this Friday.

## 2023-04-22 ENCOUNTER — Ambulatory Visit: Payer: Commercial Managed Care - HMO | Attending: Cardiovascular Disease
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
## 2023-04-25 NOTE — Patient Instructions (Signed)
SURGICAL WAITING ROOM VISITATION Patients having surgery or a procedure may have no more than 2 support people in the waiting area - these visitors may rotate in the visitor waiting room.   Due to an increase in RSV and influenza rates and associated hospitalizations, children ages 71 and under may not visit patients in [facility] hospitals. If the patient needs to stay at the hospital during part of their recovery, the visitor guidelines for inpatient rooms apply.  PRE-OP VISITATION  Pre-op nurse will coordinate an appropriate time for 1 support person to accompany the patient in pre-op.  This support person may not rotate.  This visitor will be contacted when the time is appropriate for the visitor to come back in the pre-op area.  Please refer to the [facility] website for the visitor guidelines for Inpatients (after your surgery is over and you are in a regular room).  You are not required to quarantine at this time prior to your surgery. However, you must do this: Hand Hygiene often Do NOT share personal items Notify your provider if you are in close contact with someone who has COVID or you develop fever 100.4 or greater, new onset of sneezing, cough, sore throat, shortness of breath or body aches.  If you test positive for Covid or have been in contact with anyone that has tested positive in the last 10 days please notify you surgeon.    Your procedure is scheduled on: Monday  May 10, 2023   Report to [facility] Main Entrance: [place] entrance where the [employer] is available.   Report to admitting at:  07:00   AM  Call this number if you have any questions or problems the morning of surgery [phone]  Do not eat food after Midnight the night prior to your surgery/procedure.  After Midnight you may have the following liquids until   06:30  AM  DAY OF SURGERY  Clear Liquid Diet Water Black Coffee (sugar ok, NO MILK/CREAM OR CREAMERS)  Tea (sugar ok, NO  MILK/CREAM OR CREAMERS) regular and decaf                             Plain Jell-O  with no fruit (NO RED)                                           Fruit ices (not with fruit pulp, NO RED)                                     Popsicles (NO RED)                                                                  Juice: NO CITRUS JUICES: only apple, WHITE grape, WHITE cranberry Sports drinks like Gatorade or Powerade (NO RED)                   The day of surgery:  Drink ONE (1) Pre-Surgery Clear Ensure at 06:30   AM the  morning of surgery. Drink in one sitting. Do not sip.  This drink was given to you during your hospital pre-op appointment visit. Nothing else to drink after completing the Pre-Surgery Clear Ensure : No candy, chewing gum or throat lozenges.    FOLLOW  ANY ADDITIONAL PRE OP INSTRUCTIONS YOU RECEIVED FROM YOUR SURGEON'S OFFICE!!!   Oral Hygiene is also important to reduce your risk of infection.        Remember - BRUSH YOUR TEETH THE MORNING OF SURGERY WITH YOUR REGULAR TOOTHPASTE  Do NOT smoke after Midnight the night before surgery.  STOP TAKING all Vitamins, Herbs and supplements 1 week before your surgery.   Take ONLY these medicines the morning of surgery with A SIP OF WATER: carvedilol (Coreg), amiodarone (Pacerone)   If You have been diagnosed with Sleep Apnea - Bring CPAP mask and tubing day of surgery. We will provide you with a CPAP machine on the day of your surgery.                   You may not have any metal on your body including jewelry, and body piercing  Do not wear lotions, powders, cologne, or deodorant  Men may shave face and neck.  Contacts, Hearing Aids, dentures or bridgework may not be worn into surgery. DENTURES WILL BE REMOVED PRIOR TO SURGERY PLEASE DO NOT APPLY "Poly grip" OR ADHESIVES!!!   Patients discharged on the day of surgery will not be allowed to drive home.  Someone NEEDS to stay with you for the first 24 hours after  anesthesia.  Do not bring your home medications to the hospital. The Pharmacy will dispense medications listed on your medication list to you during your admission in the Hospital.   Please read over the following fact sheets you were given: IF YOU HAVE QUESTIONS ABOUT YOUR PRE-OP INSTRUCTIONS, PLEASE CALL [phone]   [facility] - Preparing for Surgery Before surgery, you can play an important role.  Because skin is not sterile, your skin needs to be as free of germs as possible.  You can reduce the number of germs on your skin by washing with CHG (chlorahexidine gluconate) soap before surgery.  CHG is an antiseptic cleaner which kills germs and bonds with the skin to continue killing germs even after washing. Please DO NOT use if you have an allergy to CHG or antibacterial soaps.  If your skin becomes reddened/irritated stop using the CHG and inform your nurse when you arrive at Short Stay. Do not shave (including legs and underarms) for at least 48 hours prior to the first CHG shower.  You may shave your face/neck.  Please follow these instructions carefully:  1.  Shower with CHG Soap the night before surgery and the  morning of surgery.  2.  If you choose to wash your hair, wash your hair first as usual with your normal  shampoo.  3.  After you shampoo, rinse your hair and body thoroughly to remove the shampoo.                             4.  Use CHG as you would any other liquid soap.  You can apply chg directly to the skin and wash.  Gently with a scrungie or clean washcloth.  5.  Apply the CHG Soap to your body ONLY FROM THE NECK DOWN.   Do not use on face/ open  Wound or open sores. Avoid contact with eyes, ears mouth and genitals (private parts).                       Wash face,  Genitals (private parts) with your normal soap.             6.  Wash thoroughly, paying special attention to the area where your  surgery  will be performed.  7.  Thoroughly rinse  your body with warm water from the neck down.  8.  DO NOT shower/wash with your normal soap after using and rinsing off the CHG Soap.            9.  Pat yourself dry with a clean towel.            10.  Wear clean pajamas.            11.  Place clean sheets on your bed the night of your first shower and do not  sleep with pets.  ON THE DAY OF SURGERY : Do not apply any lotions/deodorants the morning of surgery.  Please wear clean clothes to the hospital/surgery center.     FAILURE TO FOLLOW THESE INSTRUCTIONS MAY RESULT IN THE CANCELLATION OF YOUR SURGERY  PATIENT SIGNATURE_________________________________  NURSE SIGNATURE__________________________________  ________________________________________________________________________       Mark [name]    An incentive spirometer is a tool that can help keep your lungs clear and active. This tool measures how well you are filling your lungs with each breath. Taking long deep breaths may help reverse or decrease the chance of developing breathing (pulmonary) problems (especially infection) following: A long period of time when you are unable to move or be active. BEFORE THE PROCEDURE  If the spirometer includes an indicator to show your best effort, your nurse or respiratory therapist will set it to a desired goal. If possible, sit up straight or lean slightly forward. Try not to slouch. Hold the incentive spirometer in an upright position. INSTRUCTIONS FOR USE  Sit on the edge of your bed if possible, or sit up as far as you can in bed or on a chair. Hold the incentive spirometer in an upright position. Breathe out normally. Place the mouthpiece in your mouth and seal your lips tightly around it. Breathe in slowly and as deeply as possible, raising the piston or the ball toward the top of the column. Hold your breath for 3-5 seconds or for as long as possible. Allow the piston or ball to fall to the bottom of the  column. Remove the mouthpiece from your mouth and breathe out normally. Rest for a few seconds and repeat Steps 1 through 7 at least 10 times every 1-2 hours when you are awake. Take your time and take a few normal breaths between deep breaths. The spirometer may include an indicator to show your best effort. Use the indicator as a goal to work toward during each repetition. After each set of 10 deep breaths, practice coughing to be sure your lungs are clear. If you have an incision (the cut made at the time of surgery), support your incision when coughing by placing a pillow or rolled up towels firmly against it. Once you are able to get out of bed, walk around indoors and cough well. You may stop using the incentive spirometer when instructed by your caregiver.  RISKS AND COMPLICATIONS Take your time so you do not get dizzy or light-headed. If you  are in pain, you may need to take or ask for pain medication before doing incentive spirometry. It is harder to take a deep breath if you are having pain. AFTER USE Rest and breathe slowly and easily. It can be helpful to keep track of a log of your progress. Your caregiver can provide you with a simple table to help with this. If you are using the spirometer at home, follow these instructions: SEEK MEDICAL CARE IF:  You are having difficultly using the spirometer. You have trouble using the spirometer as often as instructed. Your pain medication is not giving enough relief while using the spirometer. You develop fever of 100.5 F (38.1 C) or higher.                                                                                                    SEEK IMMEDIATE MEDICAL CARE IF:  You cough up bloody sputum that had not been present before. You develop fever of 102 F (38.9 C) or greater. You develop worsening pain at or near the incision site. MAKE SURE YOU:  Understand these instructions. Will watch your condition. Will get help right away if  you are not doing well or get worse. Document Released: 12/28/2006 Document Revised: 11/09/2011 Document Reviewed: 02/28/2007 [facility] Patient Information 2014 [place], [place].

## 2023-04-28 ENCOUNTER — Encounter (HOSPITAL_COMMUNITY): Payer: Self-pay

## 2023-04-28 ENCOUNTER — Other Ambulatory Visit: Payer: Self-pay

## 2023-04-28 ENCOUNTER — Encounter (HOSPITAL_COMMUNITY)
Admission: RE | Admit: 2023-04-28 | Discharge: 2023-04-28 | Disposition: A | Discharge status: Home / Self Care | Payer: Commercial Managed Care - HMO | Source: Ambulatory Visit | Attending: Orthopedic Surgery | Admitting: Orthopedic Surgery

## 2023-04-28 DIAGNOSIS — Z01818 Encounter for other preprocedural examination: Secondary | ICD-10-CM | POA: Diagnosis present

## 2023-04-28 DIAGNOSIS — Z01812 Encounter for preprocedural laboratory examination: Secondary | ICD-10-CM | POA: Insufficient documentation

## 2023-04-28 DIAGNOSIS — I10 Essential (primary) hypertension: Secondary | ICD-10-CM | POA: Diagnosis not present

## 2023-04-28 HISTORY — DX: Unspecified osteoarthritis, unspecified site: M19.90

## 2023-04-28 HISTORY — DX: Attention-deficit hyperactivity disorder, unspecified type: F90.9

## 2023-04-28 HISTORY — DX: Personal history of urinary calculi: Z87.442

## 2023-04-28 HISTORY — DX: Pneumonia, unspecified organism: J18.9

## 2023-04-28 LAB — BASIC METABOLIC PANEL
Anion gap: 9 (ref 5–15)
BUN: 30 mg/dL — ABNORMAL HIGH (ref 6–20)
CO2: 24 mmol/L (ref 22–32)
Calcium: 8.8 mg/dL — ABNORMAL LOW (ref 8.9–10.3)
Chloride: 105 mmol/L (ref 98–111)
Creatinine, Ser: 1.5 mg/dL — ABNORMAL HIGH (ref 0.61–1.24)
GFR, Estimated: 53 mL/min — ABNORMAL LOW (ref >60)
Glucose, Bld: 99 mg/dL (ref 70–99)
Potassium: 4.6 mmol/L (ref 3.5–5.1)
Sodium: 138 mmol/L (ref 135–145)

## 2023-04-28 LAB — CBC
HCT: 36.9 % — ABNORMAL LOW (ref 39.0–52.0)
Hemoglobin: 12 g/dL — ABNORMAL LOW (ref 13.0–17.0)
MCH: 31.5 pg (ref 26.0–34.0)
MCHC: 32.5 g/dL (ref 30.0–36.0)
MCV: 96.9 fL (ref 80.0–100.0)
Platelets: 162 10*3/uL (ref 150–400)
RBC: 3.81 MIL/uL — ABNORMAL LOW (ref 4.22–5.81)
RDW: 13.1 % (ref 11.5–15.5)
WBC: 5.4 10*3/uL (ref 4.0–10.5)
nRBC: 0 % (ref 0.0–0.2)

## 2023-04-28 NOTE — Progress Notes (Signed)
Carelink Summary Report / Loop Recorder 

## 2023-04-29 ENCOUNTER — Encounter: Payer: Self-pay | Admitting: Family Medicine

## 2023-04-29 ENCOUNTER — Ambulatory Visit: Payer: Commercial Managed Care - HMO | Admitting: Family Medicine

## 2023-04-29 ENCOUNTER — Encounter: Payer: Self-pay | Admitting: Cardiology

## 2023-04-29 ENCOUNTER — Telehealth: Payer: Self-pay | Admitting: *Deleted

## 2023-04-29 VITALS — BP 132/80 | HR 55 | Temp 98.3°F | Resp 18 | Ht 71.0 in | Wt 247.0 lb

## 2023-04-29 DIAGNOSIS — S81801D Unspecified open wound, right lower leg, subsequent encounter: Secondary | ICD-10-CM

## 2023-04-29 MED ORDER — CEPHALEXIN 500 MG PO CAPS: 500.0000 mg | ORAL_CAPSULE | Freq: Three times a day (TID) | ORAL | 0 refills | Status: AC

## 2023-04-29 NOTE — Telephone Encounter (Signed)
Pt advised to call PCP, rationale given. Aware cardiology is not going to advise on kidney function, and that it is not related to Amiodarone. He appreciates my callback on this matter and explanation.

## 2023-04-29 NOTE — Telephone Encounter (Signed)
Pt asking if theres a way to lower his creatinine level and is noting Dr Lalla Brothers would like it to be monitored

## 2023-04-29 NOTE — Telephone Encounter (Signed)
Patient returned staff call. 

## 2023-04-29 NOTE — Progress Notes (Signed)
   Subjective:    Patient ID: Mark [name], male    DOB: 01/24/1961, 62 y.o.   MRN: [id number]  HPI Leg wound- pt was started on Augmentin on 7/16 after falling on a trailer hitch and splitting open R shin.  Wife is concerned about surrounding redness.  No TTP.  Has surgery upcoming on 9/9.   Review of Systems For ROS see HPI     Objective:   Physical Exam Vitals reviewed.  Constitutional:      General: He is not in acute distress.    Appearance: Normal appearance. He is not ill-appearing.  HENT:     Head: Normocephalic and atraumatic.  Skin:    General: Skin is warm and dry.     Findings: Erythema (redness and warmth surrouding nickel sized open wound on R anterior shin, no TTP, no fluctuance) present.  Neurological:     Mental Status: He is alert.           Assessment & Plan:  Infected leg wound- new.  Pt completed 10 days of Augmentin therapy last month.  Area is again warm, red and appears to have surrounding cellulitis.  Since he has surgery upcoming, will tx w/ Keflex TID.  Pt expressed understanding and is in agreement w/ plan.

## 2023-04-29 NOTE — Telephone Encounter (Signed)
Received a voicemail from the pt stating to call him back. Called pt back and had to leave a voicemail. Will await a call back to see what is needed.

## 2023-04-29 NOTE — Patient Instructions (Signed)
Follow up as needed or as scheduled START the Cephalexin 3x/day for 7 days.  Take w/ food Keep area clean and dry Hold off on antibiotic ointment Call with any questions or concerns GOOD LUCK WITH SURGERY!!!

## 2023-04-29 NOTE — Telephone Encounter (Signed)
Called pt and left a message that he will need a clearance form for upcoming procedure faxed over to out Pre-op pool, which is [phone] or [phone]. Advised to call back for confirmation.

## 2023-04-30 ENCOUNTER — Telehealth: Payer: Self-pay | Admitting: *Deleted

## 2023-04-30 ENCOUNTER — Telehealth: Payer: Self-pay

## 2023-04-30 ENCOUNTER — Other Ambulatory Visit: Payer: Self-pay

## 2023-04-30 MED ORDER — ENOXAPARIN SODIUM 120 MG/0.8ML IJ SOSY: 120.0000 mg | PREFILLED_SYRINGE | Freq: Two times a day (BID) | INTRAMUSCULAR | 1 refills | Status: DC

## 2023-04-30 NOTE — Telephone Encounter (Signed)
   Name: Mark [name]  DOB: 1960/09/07  MRN: [id number]  Primary Cardiologist: None   Preoperative team, please contact this patient and set up a phone call appointment for further preoperative risk assessment. Please obtain consent and complete medication review. Thank you for your help.  I confirm that guidance regarding antiplatelet and oral anticoagulation therapy has been completed and, if necessary, noted below.  Per office protocol, patient can hold warfarin for 5 days prior to procedure.   Patient WILL need bridging with Lovenox (enoxaparin) around procedure. Enoxaparin 120mg  BID. Our coumadin clinic will coordinate. He will need to start holding 9/4.    Joylene Grapes, NP 04/30/2023, 1:55 PM [facility]

## 2023-04-30 NOTE — Telephone Encounter (Signed)
Pt has been scheduled for tele pre op appt 05/06/23 @ 3 pm. Ok per Bernadene Person, NP to add on due to procedure date and med hold.    Med rec and consent are done.

## 2023-04-30 NOTE — Telephone Encounter (Signed)
I spoke with patient and advised on upcoming procedure, holding Coumadin and Lovenox bridging.  He knows to start Holding Coumadin on 9/4 and has an appt with [place] on 9/5 to further discuss Lovenox Bridging plan.  I told him that I would order Lovenox today to be ready for next week.

## 2023-04-30 NOTE — Telephone Encounter (Signed)
Patient with diagnosis of mechanical aortic valve and afib on warfarin for anticoagulation.    Procedure:  LEFT PATELLA BURSA EXCISION  Date of procedure: 05/10/23  CrCl 67 ml/min Platelet count 162  Per office protocol, patient can hold warfarin for 5 days prior to procedure.   Patient WILL need bridging with Lovenox (enoxaparin) around procedure.  Enoxaparin 120mg  BID Our coumadin clinic will coordinate. He will need to start holding 9/4  **This guidance is not considered finalized until pre-operative APP has relayed final recommendations.**

## 2023-04-30 NOTE — Telephone Encounter (Signed)
   Pre-operative Risk Assessment    Patient Name: Mark [name]  DOB: 1961/08/17 MRN: [id number]    DATE OF LAST VISIT: 08/26/22 DR. Gala Romney DATE OF NEXT VISIT: 08/20/23 Wetzel Bjornstad, PAC (A-[facility] CLINIC)  Request for Surgical Clearance    Procedure:   LEFT PATELLA BURSA EXCISION  Date of Surgery:  Clearance 04/26/23; HOWEVER THIS DATE HAS PASSED; IN REVIEW OF THE CHART LOOKS LIKE SURGERY IS PLANNED FOR 05/10/23                                 Surgeon:  DR. Gean Birchwood Surgeon's Group or Practice Name:  [facility] Phone number:  [phone] ATTN: REBECCA LANG Fax number:  [phone]   Type of Clearance Requested:   - Medical  - Pharmacy:  Hold Aspirin and Warfarin (Coumadin)     Type of Anesthesia:   CHOICE   Additional requests/questions:    Elpidio Anis   04/30/2023, 11:45 AM

## 2023-04-30 NOTE — Telephone Encounter (Signed)
Spoke with pt and he stated he gave the fax number for HF clinic to the surgical team and they told him they have faxed it 3 times. Advised that if he faxes to our Preop team for clearance at [phone] we can ensure it is received and the MD's/ PreOp team can take care of this accordingly.  He will call his Pre Op team back now he states and give that number.

## 2023-04-30 NOTE — Telephone Encounter (Signed)
Pt has been scheduled for tele pre op appt 05/06/23 @ 3 pm. Ok per Bernadene Person, NP to add on due to procedure date and med hold.   Med rec and consent are done.     Patient Consent for Virtual Visit        Mark [name] has provided verbal consent on 04/30/2023 for a virtual visit (video or telephone).   CONSENT FOR VIRTUAL VISIT FOR:  Mark [name]  By participating in this virtual visit I agree to the following:  I hereby voluntarily request, consent and authorize [facility] and its employed or contracted physicians, physician assistants, nurse practitioners or other licensed health care professionals (the Practitioner), to provide me with telemedicine health care services (the "Services") as deemed necessary by the treating Practitioner. I acknowledge and consent to receive the Services by the Practitioner via telemedicine. I understand that the telemedicine visit will involve communicating with the Practitioner through live audiovisual communication technology and the disclosure of certain medical information by electronic transmission. I acknowledge that I have been given the opportunity to request an in-person assessment or other available alternative prior to the telemedicine visit and am voluntarily participating in the telemedicine visit.  I understand that I have the right to withhold or withdraw my consent to the use of telemedicine in the course of my care at any time, without affecting my right to future care or treatment, and that the Practitioner or I may terminate the telemedicine visit at any time. I understand that I have the right to inspect all information obtained and/or recorded in the course of the telemedicine visit and may receive copies of available information for a reasonable fee.  I understand that some of the potential risks of receiving the Services via telemedicine include:  Delay or interruption in medical evaluation due to technological  equipment failure or disruption; Information transmitted may not be sufficient (e.g. poor resolution of images) to allow for appropriate medical decision making by the Practitioner; and/or  In rare instances, security protocols could fail, causing a breach of personal health information.  Furthermore, I acknowledge that it is my responsibility to provide information about my medical history, conditions and care that is complete and accurate to the best of my ability. I acknowledge that Practitioner's advice, recommendations, and/or decision may be based on factors not within their control, such as incomplete or inaccurate data provided by me or distortions of diagnostic images or specimens that may result from electronic transmissions. I understand that the practice of medicine is not an exact science and that Practitioner makes no warranties or guarantees regarding treatment outcomes. I acknowledge that a copy of this consent can be made available to me via my patient portal [facility] MyChart), or I can request a printed copy by calling the office of [facility].    I understand that my insurance will be billed for this visit.   I have read or had this consent read to me. I understand the contents of this consent, which adequately explains the benefits and risks of the Services being provided via telemedicine.  I have been provided ample opportunity to ask questions regarding this consent and the Services and have had my questions answered to my satisfaction. I give my informed consent for the services to be provided through the use of telemedicine in my medical care
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
## 2023-05-06 ENCOUNTER — Other Ambulatory Visit: Payer: Self-pay | Admitting: Family Medicine

## 2023-05-06 ENCOUNTER — Ambulatory Visit: Payer: Commercial Managed Care - HMO | Attending: Internal Medicine

## 2023-05-06 ENCOUNTER — Ambulatory Visit: Payer: Commercial Managed Care - HMO

## 2023-05-06 DIAGNOSIS — I4891 Unspecified atrial fibrillation: Secondary | ICD-10-CM

## 2023-05-06 DIAGNOSIS — Z952 Presence of prosthetic heart valve: Secondary | ICD-10-CM | POA: Diagnosis not present

## 2023-05-06 LAB — POCT INR: INR: 2.5 (ref 2.0–3.0)

## 2023-05-06 NOTE — Patient Instructions (Addendum)
Description   Follow pre/post procedure instructions. Post procedure, resume Warfarin.  Normal dose: 1/2 tablet daily.  [facility] x 2 per week;  Recheck INR 1 week post procedure.  Started Amio 200mg  BID x 1 month (05/18/2022) then QD.  Coumadin Clinic [phone] Cardiac clearance form Fax to [phone]      9/3: Last dose of warfarin.  9/4: No warfarin or enoxaparin (Lovenox).  9/5: Inject enoxaparin 120mg  in the fatty abdominal tissue at least 2 inches from the belly button twice a day about 12 hours apart, 8am and 8pm rotate sites. No warfarin.  9/6: Inject enoxaparin in the fatty tissue every 12 hours, 8am and 8pm. No warfarin.  9/7 Inject enoxaparin in the fatty tissue every 12 hours, 8am and 8pm. No warfarin.  9/8: Inject enoxaparin in the fatty tissue in the morning at 8 am (No PM dose). No warfarin.  9/9: Procedure Day - No enoxaparin - Resume warfarin in the evening or as directed by doctor (take an extra half tablet with usual dose for 2 days then resume normal dose).  9/10: Resume enoxaparin inject in the fatty tissue every 12 hours and take warfarin  9/11: Inject enoxaparin in the fatty tissue every 12 hours and take warfarin  9/12: Inject enoxaparin in the fatty tissue every 12 hours and take warfarin  9/13: Inject enoxaparin in the fatty tissue every 12 hours and take warfarin  9/14: Inject enoxaparin in the fatty tissue every 12 hours and take warfarin  9/15: Inject enoxaparin in the fatty tissue every 12 hours and take warfarin  9/16: Take Lovenox injection and warfarin appt to check INR.

## 2023-05-07 NOTE — Anesthesia Preprocedure Evaluation (Signed)
Anesthesia Evaluation  Patient identified by MRN, date of birth, ID band Patient awake    Reviewed: Allergy & Precautions, H&P , NPO status , Patient's Chart, lab work & pertinent test results, reviewed documented beta blocker date and time   History of Anesthesia Complications (+) PONV and history of anesthetic complications  Airway Mallampati: III  TM Distance: >3 FB Neck ROM: Full    Dental  (+) Teeth Intact, Dental Advisory Given   Pulmonary sleep apnea (unable to tolerate CPAP)    Pulmonary exam normal breath sounds clear to auscultation       Cardiovascular hypertension, Pt. on medications and Pt. on home beta blockers + Peripheral Vascular Disease and +CHF  + dysrhythmias (coumadin LD 9/3, bridged w/ lovenox LD 24h) Atrial Fibrillation + Valvular Problems/Murmurs (s/p AVR 1983, moderate AS on last echo) AS  Rhythm:Regular Rate:Normal  Echo 07/2022  1. Left ventricular ejection fraction, by estimation, is 50 to 55%. The  left ventricle has low normal function. The left ventricle has no regional  wall motion abnormalities. There is moderate concentric left ventricular  hypertrophy. Left ventricular  diastolic parameters are consistent with Grade I diastolic dysfunction  (impaired relaxation).   2. Right ventricular systolic function is mildly reduced. The right  ventricular size is normal.   3. Left atrial size was mildly dilated.   4. Right atrial size was mildly dilated.   5. The mitral valve is normal in structure. No evidence of mitral valve  regurgitation. No evidence of mitral stenosis.   6. The aortic valve is normal in structure. Aortic valve regurgitation is  not visualized. Moderate aortic valve stenosis. Aortic valve area, by VTI  measures 0.77 cm. Aortic valve mean gradient measures 20.0 mmHg. Aortic  valve Vmax measures 3.02 m/s.   7. Aortic dilatation noted. There is severe dilatation of the ascending   aorta, measuring 50 mm.   8. The inferior vena cava is normal in size with greater than 50%  respiratory variability, suggesting right atrial pressure of 3 mmHg.      Neuro/Psych  Headaches  negative psych ROS   GI/Hepatic negative GI ROS, Neg liver ROS,,,  Endo/Other  negative endocrine ROS    Renal/GU negative Renal ROS  negative genitourinary   Musculoskeletal  (+) Arthritis , Osteoarthritis,    Abdominal   Peds negative pediatric ROS (+)  Hematology negative hematology ROS (+)   Anesthesia Other Findings   Reproductive/Obstetrics negative OB ROS                             Anesthesia Physical Anesthesia Plan  ASA: 3  Anesthesia Plan: MAC, Regional and Spinal   Post-op Pain Management: Tylenol PO (pre-op)* and Regional block*   Induction:   PONV Risk Score and Plan: 2 and Propofol infusion and TIVA  Airway Management Planned: Natural Airway and Simple Face Mask  Additional Equipment: None  Intra-op Plan:   Post-operative Plan:   Informed Consent: I have reviewed the patients History and Physical, chart, labs and discussed the procedure including the risks, benefits and alternatives for the proposed anesthesia with the patient or authorized representative who has indicated his/her understanding and acceptance.       Plan Discussed with: CRNA  Anesthesia Plan Comments: (Can tolerate oxycodone if paired w/ zofran  PONV w/ all general anesthetics in past, preference for spinal )       Anesthesia Quick Evaluation
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
## 2023-05-07 NOTE — Progress Notes (Signed)
Called and updated time change on surgery.  Aware to be here at 1015 for 1245 surgery.

## 2023-05-10 ENCOUNTER — Ambulatory Visit (HOSPITAL_COMMUNITY): Payer: Self-pay | Admitting: Physician Assistant

## 2023-05-10 ENCOUNTER — Other Ambulatory Visit: Payer: Self-pay

## 2023-05-10 ENCOUNTER — Encounter (HOSPITAL_COMMUNITY): Payer: Self-pay | Admitting: Orthopedic Surgery

## 2023-05-10 ENCOUNTER — Ambulatory Visit (HOSPITAL_BASED_OUTPATIENT_CLINIC_OR_DEPARTMENT_OTHER): Payer: Commercial Managed Care - HMO | Admitting: Anesthesiology

## 2023-05-10 ENCOUNTER — Ambulatory Visit (HOSPITAL_COMMUNITY)
Admission: RE | Admit: 2023-05-10 | Discharge: 2023-05-10 | Disposition: A | Discharge status: Home / Self Care | Payer: Commercial Managed Care - HMO | Attending: Orthopedic Surgery | Admitting: Orthopedic Surgery

## 2023-05-10 ENCOUNTER — Encounter (HOSPITAL_COMMUNITY)
Admission: RE | Disposition: A | Discharge status: Home / Self Care | Payer: Self-pay | Source: Home / Self Care | Attending: Orthopedic Surgery

## 2023-05-10 DIAGNOSIS — L987 Excessive and redundant skin and subcutaneous tissue: Secondary | ICD-10-CM | POA: Insufficient documentation

## 2023-05-10 DIAGNOSIS — R2242 Localized swelling, mass and lump, left lower limb: Secondary | ICD-10-CM

## 2023-05-10 DIAGNOSIS — E785 Hyperlipidemia, unspecified: Secondary | ICD-10-CM

## 2023-05-10 DIAGNOSIS — I480 Paroxysmal atrial fibrillation: Secondary | ICD-10-CM

## 2023-05-10 DIAGNOSIS — M7042 Prepatellar bursitis, left knee: Secondary | ICD-10-CM | POA: Insufficient documentation

## 2023-05-10 DIAGNOSIS — G4730 Sleep apnea, unspecified: Secondary | ICD-10-CM | POA: Insufficient documentation

## 2023-05-10 DIAGNOSIS — I110 Hypertensive heart disease with heart failure: Secondary | ICD-10-CM | POA: Insufficient documentation

## 2023-05-10 DIAGNOSIS — I4891 Unspecified atrial fibrillation: Secondary | ICD-10-CM | POA: Insufficient documentation

## 2023-05-10 DIAGNOSIS — Z7901 Long term (current) use of anticoagulants: Secondary | ICD-10-CM

## 2023-05-10 DIAGNOSIS — I509 Heart failure, unspecified: Secondary | ICD-10-CM | POA: Insufficient documentation

## 2023-05-10 HISTORY — PX: EAR CYST EXCISION: SHX22

## 2023-05-10 LAB — APTT: aPTT: 26 s (ref 24–36)

## 2023-05-10 LAB — PROTIME-INR
INR: 1.1 (ref 0.8–1.2)
Prothrombin Time: 14.5 s (ref 11.4–15.2)

## 2023-05-10 SURGERY — EXCISION, SYNOVIAL CYST, POPLITEAL SPACE
Anesthesia: Monitor Anesthesia Care | Site: Knee | Laterality: Left

## 2023-05-10 MED ORDER — PROPOFOL 500 MG/50ML IV EMUL
INTRAVENOUS | Status: DC | PRN
  Administered 2023-05-10: 75 ug/kg/min via INTRAVENOUS

## 2023-05-10 MED ORDER — ONDANSETRON HCL 4 MG/2ML IJ SOLN: 4.0000 mg | Freq: Once | INTRAMUSCULAR | Status: DC | PRN

## 2023-05-10 MED ORDER — AMISULPRIDE (ANTIEMETIC) 5 MG/2ML IV SOLN: 10.0000 mg | Freq: Once | INTRAVENOUS | Status: DC | PRN

## 2023-05-10 MED ORDER — DEXAMETHASONE SODIUM PHOSPHATE 10 MG/ML IJ SOLN
INTRAMUSCULAR | Status: DC | PRN
Start: 2023-05-10 — End: 2023-05-10
  Administered 2023-05-10: 10 mg via INTRAVENOUS

## 2023-05-10 MED ORDER — FENTANYL CITRATE PF 50 MCG/ML IJ SOSY
50.0000 ug | PREFILLED_SYRINGE | INTRAMUSCULAR | Status: DC
  Administered 2023-05-10: 100 ug via INTRAVENOUS
  Filled 2023-05-10: qty 2

## 2023-05-10 MED ORDER — HYDROCODONE-ACETAMINOPHEN 5-325 MG PO TABS: 1.0000 | ORAL_TABLET | Freq: Four times a day (QID) | ORAL | 0 refills | Status: DC | PRN

## 2023-05-10 MED ORDER — PHENYLEPHRINE HCL (PRESSORS) 10 MG/ML IV SOLN
INTRAVENOUS | Status: AC
  Filled 2023-05-10: qty 1

## 2023-05-10 MED ORDER — KETOROLAC TROMETHAMINE 30 MG/ML IJ SOLN: 30.0000 mg | Freq: Once | INTRAMUSCULAR | Status: DC | PRN

## 2023-05-10 MED ORDER — ONDANSETRON HCL 4 MG/2ML IJ SOLN
INTRAMUSCULAR | Status: DC | PRN
  Administered 2023-05-10: 4 mg via INTRAVENOUS

## 2023-05-10 MED ORDER — TRANEXAMIC ACID 1000 MG/10ML IV SOLN
INTRAVENOUS | Status: DC | PRN
  Administered 2023-05-10: 2000 mg via TOPICAL

## 2023-05-10 MED ORDER — TRANEXAMIC ACID 1000 MG/10ML IV SOLN
2000.0000 mg | INTRAVENOUS | Status: DC
  Filled 2023-05-10: qty 20

## 2023-05-10 MED ORDER — OXYCODONE HCL 5 MG PO TABS: 5.0000 mg | ORAL_TABLET | Freq: Once | ORAL | Status: DC | PRN

## 2023-05-10 MED ORDER — 0.9 % SODIUM CHLORIDE (POUR BTL) OPTIME
TOPICAL | Status: DC | PRN
Start: 2023-05-10 — End: 2023-05-11
  Administered 2023-05-10: 1000 mL

## 2023-05-10 MED ORDER — EPHEDRINE SULFATE-NACL 50-0.9 MG/10ML-% IV SOSY
PREFILLED_SYRINGE | INTRAVENOUS | Status: DC | PRN
  Administered 2023-05-10 (×2): 2.5 mg via INTRAVENOUS

## 2023-05-10 MED ORDER — ACETAMINOPHEN 500 MG PO TABS
1000.0000 mg | ORAL_TABLET | Freq: Once | ORAL | Status: AC
  Administered 2023-05-10: 1000 mg via ORAL
  Filled 2023-05-10: qty 2

## 2023-05-10 MED ORDER — ROPIVACAINE HCL 5 MG/ML IJ SOLN
INTRAMUSCULAR | Status: DC | PRN
  Administered 2023-05-10: 30 mL via PERINEURAL

## 2023-05-10 MED ORDER — CHLORHEXIDINE GLUCONATE 0.12 % MT SOLN
15.0000 mL | Freq: Once | OROMUCOSAL | Status: AC
  Administered 2023-05-10: 15 mL via OROMUCOSAL

## 2023-05-10 MED ORDER — TRANEXAMIC ACID-NACL 1000-0.7 MG/100ML-% IV SOLN
1000.0000 mg | INTRAVENOUS | Status: AC
  Administered 2023-05-10: 1000 mg via INTRAVENOUS
  Filled 2023-05-10: qty 100

## 2023-05-10 MED ORDER — OXYCODONE HCL 5 MG/5ML PO SOLN: 5.0000 mg | Freq: Once | ORAL | Status: DC | PRN

## 2023-05-10 MED ORDER — DEXAMETHASONE SODIUM PHOSPHATE 10 MG/ML IJ SOLN
INTRAMUSCULAR | Status: DC | PRN
  Administered 2023-05-10: 10 mg

## 2023-05-10 MED ORDER — MIDAZOLAM HCL 2 MG/2ML IJ SOLN
1.0000 mg | INTRAMUSCULAR | Status: DC
  Administered 2023-05-10: 2 mg via INTRAVENOUS
  Filled 2023-05-10: qty 2

## 2023-05-10 MED ORDER — HYDROMORPHONE HCL 1 MG/ML IJ SOLN: 0.2500 mg | INTRAMUSCULAR | Status: DC | PRN

## 2023-05-10 MED ORDER — LACTATED RINGERS IV SOLN
INTRAVENOUS | Status: DC
  Administered 2023-05-10: 1000 mL via INTRAVENOUS

## 2023-05-10 MED ORDER — BUPIVACAINE-EPINEPHRINE 0.25% -1:200000 IJ SOLN
INTRAMUSCULAR | Status: AC
  Filled 2023-05-10: qty 1

## 2023-05-10 MED ORDER — LACTATED RINGERS IV SOLN: INTRAVENOUS | Status: DC

## 2023-05-10 MED ORDER — ORAL CARE MOUTH RINSE: 15.0000 mL | Freq: Once | OROMUCOSAL | Status: AC

## 2023-05-10 MED ORDER — PROPOFOL 10 MG/ML IV BOLUS
INTRAVENOUS | Status: DC | PRN
Start: 2023-05-10 — End: 2023-05-10
  Administered 2023-05-10: 100 mg via INTRAVENOUS
  Administered 2023-05-10: 90 mg via INTRAVENOUS
  Administered 2023-05-10: 50 mg via INTRAVENOUS
  Administered 2023-05-10: 30 mg via INTRAVENOUS

## 2023-05-10 MED ORDER — BUPIVACAINE IN DEXTROSE 0.75-8.25 % IT SOLN
INTRATHECAL | Status: DC | PRN
Start: 2023-05-10 — End: 2023-05-10
  Administered 2023-05-10: 11.25 mg via INTRATHECAL

## 2023-05-10 MED ORDER — ONDANSETRON HCL 4 MG/2ML IJ SOLN
4.0000 mg | Freq: Once | INTRAMUSCULAR | Status: AC
  Administered 2023-05-10: 4 mg via INTRAVENOUS

## 2023-05-10 MED ORDER — CEFAZOLIN SODIUM-DEXTROSE 2-4 GM/100ML-% IV SOLN
2.0000 g | INTRAVENOUS | Status: AC
  Administered 2023-05-10: 2 g via INTRAVENOUS
  Filled 2023-05-10: qty 100

## 2023-05-10 MED ORDER — ONDANSETRON HCL 4 MG/2ML IJ SOLN
INTRAMUSCULAR | Status: AC
  Filled 2023-05-10: qty 2

## 2023-05-10 MED ORDER — BUPIVACAINE-EPINEPHRINE 0.25% -1:200000 IJ SOLN
INTRAMUSCULAR | Status: DC | PRN
Start: 2023-05-10 — End: 2023-05-11

## 2023-05-10 SURGICAL SUPPLY — 52 items
APL PRP STRL LF DISP 70% ISPRP (MISCELLANEOUS)
BAG COUNTER SPONGE SURGICOUNT (BAG) IMPLANT
BAG SPEC THK2 15X12 ZIP CLS (MISCELLANEOUS)
BAG SPNG CNTER NS LX DISP (BAG)
BAG ZIPLOCK 12X15 (MISCELLANEOUS) ×1 IMPLANT
BANDAGE ESMARK 6X9 LF (GAUZE/BANDAGES/DRESSINGS) ×1 IMPLANT
BLADE SAW SGTL 81X20 HD (BLADE) ×1 IMPLANT
BLADE SURG SZ10 CARB STEEL (BLADE) IMPLANT
BNDG CMPR 5X6 CHSV STRCH STRL (GAUZE/BANDAGES/DRESSINGS)
BNDG CMPR 6 X 5 YARDS HK CLSR (GAUZE/BANDAGES/DRESSINGS)
BNDG CMPR 9X6 STRL LF SNTH (GAUZE/BANDAGES/DRESSINGS)
BNDG CMPR MED 10X6 ELC LF (GAUZE/BANDAGES/DRESSINGS) ×1
BNDG COHESIVE 6X5 TAN ST LF (GAUZE/BANDAGES/DRESSINGS) ×1 IMPLANT
BNDG ELASTIC 6INX 5YD STR LF (GAUZE/BANDAGES/DRESSINGS) ×1 IMPLANT
BNDG ELASTIC 6X10 VLCR STRL LF (GAUZE/BANDAGES/DRESSINGS) IMPLANT
BNDG ESMARK 6X9 LF (GAUZE/BANDAGES/DRESSINGS)
BNDG GAUZE DERMACEA FLUFF 4 (GAUZE/BANDAGES/DRESSINGS) ×1 IMPLANT
BNDG GZE DERMACEA 4 6PLY (GAUZE/BANDAGES/DRESSINGS)
BUR OVAL CARBIDE 4.0 (BURR) ×1 IMPLANT
CHLORAPREP W/TINT 26 (MISCELLANEOUS) ×1 IMPLANT
COVER SURGICAL LIGHT HANDLE (MISCELLANEOUS) ×1 IMPLANT
CUFF TOURN SGL QUICK 34 (TOURNIQUET CUFF) ×1
CUFF TRNQT CYL 34X4.125X (TOURNIQUET CUFF) ×1 IMPLANT
DRAPE C-ARM 42X120 X-RAY (DRAPES) ×1 IMPLANT
DRAPE U-SHAPE 47X51 STRL (DRAPES) ×1 IMPLANT
DRSG EMULSION OIL 3X16 NADH (GAUZE/BANDAGES/DRESSINGS) ×1 IMPLANT
ELECT REM PT RETURN 15FT ADLT (MISCELLANEOUS) ×1 IMPLANT
GAUZE PAD ABD 8X10 STRL (GAUZE/BANDAGES/DRESSINGS) ×1 IMPLANT
GAUZE SPONGE 4X4 12PLY STRL (GAUZE/BANDAGES/DRESSINGS) ×1 IMPLANT
GLOVE BIO SURGEON STRL SZ7 (GLOVE) ×1 IMPLANT
GLOVE BIO SURGEON STRL SZ7.5 (GLOVE) ×1 IMPLANT
GLOVE BIOGEL PI IND STRL 7.0 (GLOVE) ×1 IMPLANT
GLOVE BIOGEL PI IND STRL 8 (GLOVE) ×1 IMPLANT
IMMOBILIZER KNEE 20 (SOFTGOODS)
IMMOBILIZER KNEE 20 THIGH 36 (SOFTGOODS) IMPLANT
KIT TURNOVER KIT A (KITS) IMPLANT
PACK ORTHO EXTREMITY (CUSTOM PROCEDURE TRAY) ×1 IMPLANT
PADDING CAST COTTON 6X4 STRL (CAST SUPPLIES) ×1 IMPLANT
PASSER SUT SWANSON 36MM LOOP (INSTRUMENTS) ×1 IMPLANT
PENCIL SMOKE EVACUATOR (MISCELLANEOUS) IMPLANT
PROTECTOR NERVE ULNAR (MISCELLANEOUS) ×1 IMPLANT
SPIKE FLUID TRANSFER (MISCELLANEOUS) ×1 IMPLANT
SPONGE T-LAP 4X18 ~~LOC~~+RFID (SPONGE) ×2 IMPLANT
SUT ETHIBOND NAB CT1 #1 30IN (SUTURE) ×2 IMPLANT
SUT VIC AB 0 CT1 27 (SUTURE)
SUT VIC AB 0 CT1 27XBRD ANTBC (SUTURE) ×2 IMPLANT
SUT VIC AB 1 CT1 27 (SUTURE)
SUT VIC AB 1 CT1 27XBRD ANTBC (SUTURE) ×1 IMPLANT
SUT VIC AB 2-0 CT1 27 (SUTURE)
SUT VIC AB 2-0 CT1 TAPERPNT 27 (SUTURE) ×1 IMPLANT
SUT VICRYL+ 3-0 36IN CT-1 (SUTURE) IMPLANT
TRAY CATH INTERMITTENT SS 16FR (CATHETERS) IMPLANT

## 2023-05-10 NOTE — Op Note (Addendum)
Preoperative diagnosis: 15 x 10 x 8 cm prepatellar bursa chronic  Postoperative diagnosis: Same  Procedure: Excision of prepatellar bursa, Left knee  Surgeon: Feliberto Gottron. Turner Daniels, MD  First assist: Tomi Likens. Vear Clock, PA-C  Anesthesia: Spinal  Estimated blood loss: 20 cc  Fluid replacement: 1 L  Tourniquet time: Not used  Specimen:  Indications for procedure: 15-year history of enlarging prepatellar bursa to the Left knee followed by another physician here in [place].  Kateri Mc is now obtained the size of 15 x 10 x 8 cm MRI scan shows it to be benign as does ultrasound.  He desires elective removal of the prepatellar bursa.  The risk and benefits of surgery been discussed and all questions answered.  Description of procedure: Patient identified by armband and taken to operating room 7 at [facility].  Appropriate anesthetic monitors were attached and spinal anesthesia was induced.  A tourniquet was applied high to the left thigh but not used.  Foot positioners were applied as well as a lateral post to the table.  The left lower extremity was prepped and draped in usual sterile fashion from the ankle to the tourniquet.  A timeout procedure was performed.  We began the procedure by making an elliptical incision 12 cm in length and 6 cm in width through the skin down to the capsule of the prepatellar bursa.  We then dissected between the prepatellar bursa and the subcutaneous tissue and excised the bursa without actually violating the capsule.  Remarkably little bleeding was noted only requiring cautery into small places.  The wound was then soaked in Tranexamic acid sponge after thoroughly irrigating with normal saline solution.  We then closed in layers with running 3-0 Vicryl subcutaneous and subcuticular suture.  A dressing of 4 x 4's and an Ace wrap was applied.  Patient was taken to the recovery room without difficulty.

## 2023-05-10 NOTE — OR Nursing (Signed)
IN AND OUT CATH AT END OF SURGERY. OF CLEAR YELLOW URINE OUT

## 2023-05-10 NOTE — Anesthesia Procedure Notes (Signed)
Anesthesia Regional Block: Adductor canal block   Pre-Anesthetic Checklist: , timeout performed,  Correct Patient, Correct Site, Correct Laterality,  Correct Procedure, Correct Position, site marked,  Risks and benefits discussed,  Surgical consent,  Pre-op evaluation,  At surgeon's request and post-op pain management  Laterality: Left  Prep: Maximum Sterile Barrier Precautions used, chloraprep       Needles:  Injection technique: Single-shot  Needle Type: Echogenic Stimulator Needle     Needle Length: 9cm  Needle Gauge: 22     Additional Needles:   Procedures:,,,, ultrasound used (permanent image in chart),,    Narrative:  Start time: 05/10/2023 1:00 PM End time: 05/10/2023 1:05 PM Injection made incrementally with aspirations every 5 mL.  Performed by: Personally  Anesthesiologist: Lannie Fields, DO  Additional Notes: Monitors applied. No increased pain on injection. No increased resistance to injection. Injection made in 5cc increments. Good needle visualization. Patient tolerated procedure well.

## 2023-05-10 NOTE — Interval H&P Note (Signed)
History and Physical Interval Note:  05/10/2023 1:43 PM  Mark [name]  has presented today for surgery, with the diagnosis of LEFT KNEE PREPATELLA BURSA.  The various methods of treatment have been discussed with the patient and family. After consideration of risks, benefits and other options for treatment, the patient has consented to  Procedure(s): LEFT KNEE EXCISION PREPATELLA BURSA (Left) as a surgical intervention.  The patient's history has been reviewed, patient examined, no change in status, stable for surgery.  I have reviewed the patient's chart and labs.  Questions were answered to the patient's satisfaction.     Nestor Lewandowsky

## 2023-05-10 NOTE — Transfer of Care (Signed)
Immediate Anesthesia Transfer of Care Note  Patient: Mark [name]  Procedure(s) Performed: LEFT KNEE EXCISION PREPATELLA BURSA (Left: Knee)  Patient Location: PACU  Anesthesia Type:Spinal and MAC combined with regional for post-op pain  Level of Consciousness: awake, alert , and oriented  Airway & Oxygen Therapy: Patient Spontanous Breathing and Patient connected to nasal cannula oxygen  Post-op Assessment: Report given to RN and Post -op Vital signs reviewed and stable  Post vital signs: Reviewed and stable  Last Vitals:  Vitals Value Taken Time  BP    Temp    Pulse    Resp    SpO2      Last Pain:  Vitals:   05/10/23 1320  TempSrc:   PainSc: 0-No pain         Complications: No notable events documented.

## 2023-05-10 NOTE — Anesthesia Procedure Notes (Signed)
Spinal  Patient location during procedure: OR Start time: 05/10/2023 4:11 PM End time: 05/10/2023 4:15 PM Reason for block: surgical anesthesia Staffing Performed: anesthesiologist  Anesthesiologist: Trevor Iha, MD Performed by: Trevor Iha, MD Authorized by: Trevor Iha, MD   Preanesthetic Checklist Completed: patient identified, IV checked, risks and benefits discussed, surgical consent, monitors and equipment checked, pre-op evaluation and timeout performed Spinal Block Patient position: sitting Prep: DuraPrep and site prepped and draped Patient monitoring: heart rate, cardiac monitor, continuous pulse ox and blood pressure Approach: midline Location: L2-3 Injection technique: single-shot Needle Needle type: Pencan  Needle gauge: 24 G Needle length: 10 cm Assessment Sensory level: T4 Events: CSF return Additional Notes  2 Attempt (s). Pt tolerated procedure well.

## 2023-05-11 ENCOUNTER — Encounter (HOSPITAL_COMMUNITY): Payer: Self-pay | Admitting: Orthopedic Surgery

## 2023-05-11 NOTE — Anesthesia Postprocedure Evaluation (Signed)
Anesthesia Post Note  Patient: Mark [name]  Procedure(s) Performed: LEFT KNEE EXCISION PREPATELLA BURSA (Left: Knee)     Patient location during evaluation: PACU Anesthesia Type: Regional and Spinal Level of consciousness: oriented and awake and alert Pain management: pain level controlled Vital Signs Assessment: post-procedure vital signs reviewed and stable Respiratory status: spontaneous breathing, respiratory function stable and patient connected to nasal cannula oxygen Cardiovascular status: blood pressure returned to baseline and stable Postop Assessment: no headache, no backache and no apparent nausea or vomiting Anesthetic complications: no   No notable events documented.  Last Vitals:  Vitals:   05/10/23 1845 05/10/23 1900  BP: 127/81 113/70  Pulse: (!) 53 (!) 50  Resp: 18 17  Temp:    SpO2: 97% 99%    Last Pain:  Vitals:   05/10/23 1900  TempSrc:   PainSc: 0-No pain                 Mariann Barter

## 2023-05-12 LAB — SURGICAL PATHOLOGY

## 2023-05-13 ENCOUNTER — Telehealth: Payer: Self-pay | Admitting: *Deleted

## 2023-05-13 NOTE — Telephone Encounter (Signed)
Pt called to report that his Orthopedic MD advised him to STOP his lovenox since the he had to have drawn off his knee. He stated he wanted to update [place] since he is to have his INR done on Monday. Advised to follow their instructions and advised that the lovenox does not cause the INR to go up or down but it protects him from having a clot or stroke while the INR is building up. Advised to continue warfarin as instructed and to call back as needed, otherwise we will see him on Monday to recheck INR.

## 2023-05-17 ENCOUNTER — Ambulatory Visit: Payer: Commercial Managed Care - HMO | Attending: Cardiology

## 2023-05-17 DIAGNOSIS — Z7901 Long term (current) use of anticoagulants: Secondary | ICD-10-CM | POA: Diagnosis not present

## 2023-05-17 DIAGNOSIS — I4891 Unspecified atrial fibrillation: Secondary | ICD-10-CM | POA: Diagnosis not present

## 2023-05-17 DIAGNOSIS — Z952 Presence of prosthetic heart valve: Secondary | ICD-10-CM

## 2023-05-17 LAB — POCT INR: INR: 3.1 — AB (ref 2.0–3.0)

## 2023-05-17 NOTE — Patient Instructions (Signed)
TAKE 1 TABLET TODAY ONLY THEN CONTINUE 1/2 tablet daily. INR in 2 wks   Started Amio 200mg  BID x 1 month (05/18/2022) then QD.  Coumadin Clinic [phone] Cardiac clearance form Fax to [phone]

## 2023-05-21 ENCOUNTER — Other Ambulatory Visit (HOSPITAL_COMMUNITY): Payer: Self-pay | Admitting: Internal Medicine

## 2023-05-24 ENCOUNTER — Ambulatory Visit (INDEPENDENT_AMBULATORY_CARE_PROVIDER_SITE_OTHER): Payer: Managed Care, Other (non HMO)

## 2023-05-24 DIAGNOSIS — I4819 Other persistent atrial fibrillation: Secondary | ICD-10-CM

## 2023-05-24 LAB — CUP PACEART REMOTE DEVICE CHECK
Date Time Interrogation Session: 20240920230833
Implantable Pulse Generator Implant Date: 20221031

## 2023-06-03 ENCOUNTER — Telehealth: Payer: Self-pay

## 2023-06-03 ENCOUNTER — Encounter: Payer: Self-pay | Admitting: Gastroenterology

## 2023-06-03 ENCOUNTER — Ambulatory Visit: Payer: Managed Care, Other (non HMO) | Admitting: Gastroenterology

## 2023-06-03 VITALS — BP 92/62 | HR 50 | Ht 70.0 in | Wt 242.5 lb

## 2023-06-03 DIAGNOSIS — Z7901 Long term (current) use of anticoagulants: Secondary | ICD-10-CM | POA: Diagnosis not present

## 2023-06-03 DIAGNOSIS — I4819 Other persistent atrial fibrillation: Secondary | ICD-10-CM

## 2023-06-03 DIAGNOSIS — I7121 Aneurysm of the ascending aorta, without rupture: Secondary | ICD-10-CM | POA: Diagnosis not present

## 2023-06-03 DIAGNOSIS — R195 Other fecal abnormalities: Secondary | ICD-10-CM | POA: Diagnosis not present

## 2023-06-03 MED ORDER — NA SULFATE-K SULFATE-MG SULF 17.5-3.13-1.6 GM/177ML PO SOLN: ORAL | 0 refills | Status: DC

## 2023-06-03 NOTE — Telephone Encounter (Signed)
Pharmacy please advise on holding coumadin  prior to colonoscopy scheduled for 06/30/2023. Most recent labs were on 04/28/2023 Thank you.

## 2023-06-03 NOTE — Telephone Encounter (Signed)
[facility] Pre-operative Risk Assessment     Request for surgical clearance:     Endoscopy Procedure  What type of surgery is being performed?     Colonoscopy  When is this surgery scheduled?     06/30/23  What type of clearance is required ?   Pharmacy  Are there any medications that need to be held prior to surgery and how long? Warfarin 5 days hold  Practice name and name of physician performing surgery?      [facility]  What is your office phone and fax number?      Phone- [phone]  Fax- [phone]  Anesthesia type (None, local, MAC, general) ?       MAC

## 2023-06-03 NOTE — Progress Notes (Signed)
Carelink Summary Report / Loop Recorder 
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
## 2023-06-03 NOTE — Patient Instructions (Addendum)
You have been scheduled for a colonoscopy. Please follow written instructions given to you at your visit today.   Please pick up your prep supplies at the pharmacy within the next 1-3 days.  If you use inhalers (even only as needed), please bring them with you on the day of your procedure.  DO NOT TAKE 7 DAYS PRIOR TO TEST- Trulicity (dulaglutide) Ozempic, Wegovy (semaglutide) Mounjaro (tirzepatide) Bydureon Bcise (exanatide extended release)  DO NOT TAKE 1 DAY PRIOR TO YOUR TEST Rybelsus (semaglutide) Adlyxin (lixisenatide) Victoza (liraglutide) Byetta (exanatide) ___________________________________________________________________________   We have sent the following medications to your pharmacy for you to pick up at your convenience: Suprep  You will be contaced by our office prior to your procedure for directions on holding your Coumadin/Warfarin.  If you do not hear from our office 1 week prior to your scheduled procedure, please call [phone] to discuss.   If your blood pressure at your visit was 140/90 or greater, please contact your primary care physician to follow up on this.  _______________________________________________________  If you are age 60 or older, your body mass index should be between 23-30. Your Body mass index is 34.8 kg/m. If this is out of the aforementioned range listed, please consider follow up with your Primary Care Provider.  If you are age 60 or younger, your body mass index should be between 19-25. Your Body mass index is 34.8 kg/m. If this is out of the aformentioned range listed, please consider follow up with your Primary Care Provider.   ________________________________________________________  The [facility] providers would like to encourage you to use [facility] to communicate with providers for non-urgent requests or questions.  Due to long hold times on the telephone, sending your provider a message by [facility] may be a faster and more  efficient way to get a response.  Please allow 48 business hours for a response.  Please remember that this is for non-urgent requests.  _______________________________________________________   Due to recent changes in healthcare laws, you may see the results of your imaging and laboratory studies on MyChart before your provider has had a chance to review them.  We understand that in some cases there may be results that are confusing or concerning to you. Not all laboratory results come back in the same time frame and the provider may be waiting for multiple results in order to interpret others.  Please give [place] 48 hours in order for your provider to thoroughly review all the results before contacting the office for clarification of your results.    Thank you for entrusting me with your care and choosing [facility].  Bayley Leanna Sato, PA-C

## 2023-06-04 ENCOUNTER — Ambulatory Visit (INDEPENDENT_AMBULATORY_CARE_PROVIDER_SITE_OTHER): Payer: Managed Care, Other (non HMO)

## 2023-06-04 ENCOUNTER — Encounter (HOSPITAL_COMMUNITY): Payer: Self-pay | Admitting: Internal Medicine

## 2023-06-04 ENCOUNTER — Ambulatory Visit
Admission: RE | Admit: 2023-06-04 | Discharge: 2023-06-04 | Disposition: A | Discharge status: Home / Self Care | Payer: Managed Care, Other (non HMO) | Source: Ambulatory Visit | Attending: Internal Medicine | Admitting: Internal Medicine

## 2023-06-04 VITALS — BP 124/80 | HR 53 | Wt 244.0 lb

## 2023-06-04 DIAGNOSIS — I350 Nonrheumatic aortic (valve) stenosis: Secondary | ICD-10-CM | POA: Diagnosis not present

## 2023-06-04 DIAGNOSIS — I110 Hypertensive heart disease with heart failure: Secondary | ICD-10-CM | POA: Diagnosis not present

## 2023-06-04 DIAGNOSIS — I5032 Chronic diastolic (congestive) heart failure: Secondary | ICD-10-CM | POA: Insufficient documentation

## 2023-06-04 DIAGNOSIS — Q2543 Congenital aneurysm of aorta: Secondary | ICD-10-CM | POA: Diagnosis not present

## 2023-06-04 DIAGNOSIS — I4891 Unspecified atrial fibrillation: Secondary | ICD-10-CM | POA: Diagnosis present

## 2023-06-04 DIAGNOSIS — E785 Hyperlipidemia, unspecified: Secondary | ICD-10-CM | POA: Insufficient documentation

## 2023-06-04 DIAGNOSIS — R079 Chest pain, unspecified: Secondary | ICD-10-CM | POA: Insufficient documentation

## 2023-06-04 DIAGNOSIS — I358 Other nonrheumatic aortic valve disorders: Secondary | ICD-10-CM | POA: Diagnosis present

## 2023-06-04 DIAGNOSIS — E669 Obesity, unspecified: Secondary | ICD-10-CM | POA: Diagnosis not present

## 2023-06-04 DIAGNOSIS — Z952 Presence of prosthetic heart valve: Secondary | ICD-10-CM | POA: Insufficient documentation

## 2023-06-04 DIAGNOSIS — G4733 Obstructive sleep apnea (adult) (pediatric): Secondary | ICD-10-CM | POA: Diagnosis not present

## 2023-06-04 DIAGNOSIS — I480 Paroxysmal atrial fibrillation: Secondary | ICD-10-CM | POA: Diagnosis not present

## 2023-06-04 DIAGNOSIS — Z7901 Long term (current) use of anticoagulants: Secondary | ICD-10-CM | POA: Diagnosis not present

## 2023-06-04 LAB — POCT INR: INR: 2.7 (ref 2.0–3.0)

## 2023-06-04 NOTE — Progress Notes (Incomplete)
Patient ID: Mark [name], male   DOB: 10-22-1960, 62 y.o.   MRN: [id number]    Advanced Heart Failure Clinic Note    PCP: [name] HF: Dr. Gala Romney   HPI:  Mark [name] is a 62 year old male with a history of bicuspid aortic valve complicated by endocarditis.  He is status post St. Jude mechanical aortic valve replacement in 1983.  He also has a history of hyperlipidemia and PAF s/p DC-CV in 9/11. OSA noncompliant with CPAP.   Over the past few years year, he has had some progression in the gradients across his valve.  He underwent cardiac catheterization in May 2009 which showed normal coronary arteries.The valve leaflets were seen to be opening well on fluoroscopy.  Had TEE.  While there was some turbulence around the valve, the leaflets seemed to be moving well.  There was no obvious pannus formation.  Gradient on his transthoracic echo was within the moderate range at 33. 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 He was seen by Dr. [name] who agreed  with continue watchul waiting.   Has struggled with AF/palpitations.  Has seen Dr. Johney Frame and had two ablations in 8/13 and 3/14. Now has Linq monitor in (placed 04/06/17). Seen in AF Clinic in 7/21 AF burden was low. Most recent device interrogation from 12/21 reviewed AF burden 13.9% with longest event 49 hr duration (overall AF burden over life of ILR ~5%). AF worse during COVID   CT chest 4/22: [name] dilation of ascending aorta 4.8 cm  CT chest 5/24 AscAo 5.2cm new pulmonary nodule  He returns for regular f/u. Underwent excision of large blood-filled bursa on 05/10/23. Still with some swelling Having occasional CP and more lightheadedness  More SOB. Has lost 21 pounds via diet.    Echo 08/26/22 EF 50-55% moderate AS mean gradient Asc ao 5.0 Personally reviewed   Echo 10/22 EF 60% moderate AS mean gradient Personally reviewed  Cardiac studies:  Echo 2/21: EF 60-65% Aortic valve mean gradient measures  29.0 mmHg. Aortic valve peak gradient  measures 57.8 mmHg. AVA 1.2 cm2 Personally reviewed  Echo 6/19: EF 55-60% AoV Mean gradient (S): 27 mm Hg  Echo 7/18  EF 55-60% AoV Mean gradient (S): 26 mm Hg. Peak gradient (S): 51 mm Hg. AVA 1.35 cm2 Echo 12/14/2014 LVEF 55-60%, Aortic valve gradient 44 to 60 mm Hg, increased from 22 to 35 mm Hg. Echo 4/17 EF 60-65%  AoV Mean gradient (S): 34 mm Hg. Peak gradient (S): 63 mm Hg.  Review of systems complete and found to be negative unless listed in HPI.   Past Medical History:  Diagnosis Date   ADHD (attention deficit hyperactivity disorder)    Aortic stenosis    a. due to bicuspid AoV; 1983 S/p mechanical AVR (St. Jude) - chronic coumadin with INR run between 3.5-4.5;  b. 03/2012 Echo: EF 60%, nl wall motion, mod dil LA.   Arthritis    Ascending aortic aneurysm [facility])    Atrial tachycardia (HCC)    a. admitted 07/2012   CHF (congestive heart failure) (HCC)    Coronary artery disease    COVID janurary 2022   Dysrhythmia    ED (erectile dysfunction)    Gallstones    H/O cardiac catheterization    a. 12/2007 Cath: nl cors.   Headache(784.0)    Hepatitis A    a. as a child (from [employer])   History of kidney stones    Hypertension  OSA (obstructive sleep apnea)    a. noncompliant with CPAP   Paroxysmal atrial fibrillation (HCC)    s/p afib ablation x 2   Pneumonia     Current Outpatient Medications  Medication Sig Dispense Refill   amiodarone (PACERONE) 100 MG tablet Take 100 mg by mouth in the morning.     aspirin EC 81 MG tablet Take 81 mg by mouth at bedtime.      carvedilol (COREG) 6.25 MG tablet TAKE 1 TABLET BY MOUTH TWICE DAILY 180 tablet 3   dapagliflozin propanediol (FARXIGA) 10 MG TABS tablet Take 1 tablet (10 mg total) by mouth daily before breakfast. 90 tablet 3   furosemide (LASIX) 20 MG tablet Take 1 tablet (20 mg total) by mouth daily. 30 tablet 5   ibuprofen (ADVIL) 200 MG tablet Take 600 mg by mouth every 6 (six) hours  as needed for moderate pain or headache.     lisinopril (ZESTRIL) 40 MG tablet TAKE 1 TABLET BY MOUTH EVERY DAY 30 tablet 11   pantoprazole (PROTONIX) 40 MG tablet TAKE 1 TABLET BY MOUTH EVERY DAY 30 tablet 1   simvastatin (ZOCOR) 20 MG tablet TAKE 1 TABLET BY MOUTH EVERYDAY AT BEDTIME 30 tablet 5   spironolactone (ALDACTONE) 25 MG tablet TAKE 1 TABLET BY MOUTH EVERY DAY 90 tablet 3   warfarin (COUMADIN) 10 MG tablet TAKE 1/2 TO 1 TABLET BY MOUTH DAILY AS DIRECTED BY COUMADIN CLINIC (Patient taking differently: Take 5 mg by mouth every evening.) 90 tablet 1   Na Sulfate-K Sulfate-Mg Sulf 17.5-3.13-1.6 GM/177ML SOLN Use as directed; may use generic; goodrx card if insurance will not cover generic (Patient not taking: Reported on 06/04/2023) 354 mL 0   No current facility-administered medications for this encounter.    PHYSICAL EXAM: Vitals:   06/04/23 1512  BP: 124/80  Pulse: (!) 53  SpO2: 97%  Weight: 110.7 kg (244 lb)   Wt Readings from Last 3 Encounters:  06/04/23 110.7 kg (244 lb)  06/03/23 110 kg (242 lb 8 oz)  05/10/23 112 kg (247 lb)    General:  Well appearing. No resp difficulty HEENT: normal Neck: supple. no JVD. Carotids 2+ bilat; no bruits. No lymphadenopathy or thryomegaly appreciated. Cor: PMI nondisplaced. Regular rate & rhythm. 3/6 AS s2 preseved Lungs: clear Abdomen: obese soft, nontender, nondistended. No hepatosplenomegaly. No bruits or masses. Good bowel sounds. Extremities: no cyanosis, clubbing, rash, rt-1+ edema Neuro: alert & orientedx3, cranial nerves grossly intact. moves all 4 extremities w/o difficulty. Affect pleasant   1. Aortic stenosis s/p mechanical AVR - Echo 03/02/17 EF normal Mean AoV gradient 26.  - Echo 6/19 AVR [name] mean gradient 27 - Echo 2/21: EF 60-65% Aortic valve mean gradient measures 29.0 mmHg. Aortic valve peak gradient measures 57.8 mmHg. AVA 1.2 cm2  - Echo 10/22 EF 60% moderate AS mean gradient Personally reviewed - Echo  08/26/22 EF 50-55% moderate AS mean gradient Asc ao 5.0 Personally reviewed - AS [name]. Continue yearly echos - Warfarin per Coumadin Clini -Reminded about SBE prophylaxis  2. Chronic diastolic HF - has mild volume overload - Add Farxiga 10   3. HTN - Blood pressure well controlled. Continue current regimen.  4. Aortic root aneurysm - Previously followed by Dr. [name].  - CT scan 2/20 which measures 5.0 x 4.9 cm, previously 4.8 x 4.6 cm measured similarly on examinations previously - CT chest 4/22: [name] dilation of ascending aorta 4.8 cm - [name] on echo today - HR  [name]. BP ok   5. Lipids - Followed by Dr. Beverely Low .  6. Paroxysmal Atrial fibrillation  - Followed by Dr. Lalla Brothers - Now on amio - continue coumadin - working on CPAP therapies   7. OSA - unable to tolerate to CPAP -needs to lose weight to qualify for Inspire devie  8. Obesity - attempting to get GLP1RA - Has referral to [facility]  Arvilla Meres, MD 3:42 PM

## 2023-06-04 NOTE — Telephone Encounter (Signed)
Patient Name: Mark [name]  DOB: 1960/12/08 MRN: [id number]  Primary Cardiologist: Lanier Prude, MD Advanced Heart Failure Clinic: Dr. Gala Romney   Chart reviewed as part of pre-operative protocol coverage. Given past medical history and time since last visit, based on ACC/AHA guidelines, Mark [name] is at acceptable risk for the planned procedure without further cardiovascular testing.   Per office protocol, patient can hold warfarin for 5 days prior to procedure. Patient will need bridging with Lovenox (enoxaparin) around procedure. This can be coordinated at [facility] Coumadin clinic where pt is followed.   The patient was advised that if he develops new symptoms prior to surgery to contact our office to arrange for a follow-up visit, and he verbalized understanding.  I will route this recommendation to the requesting party via Epic fax function and remove from pre-op pool.  Please call with questions.  Joni Reining, NP 06/04/2023, 4:31 PM

## 2023-06-04 NOTE — Telephone Encounter (Signed)
Patient with diagnosis of mechanical AVR + afib on warfarin for anticoagulation.    Procedure: colonoscopy Date of procedure: 06/30/23  CrCl 39mL/min using adjusted body weight Platelet count 162K  Per office protocol, patient can hold warfarin for 5 days prior to procedure. Patient will need bridging with Lovenox (enoxaparin) around procedure. This can be coordinated at [facility] Coumadin clinic where pt is followed.  **This guidance is not considered finalized until pre-operative APP has relayed final recommendations.**

## 2023-06-04 NOTE — Patient Instructions (Signed)
Description   Take 1 tablet today and 1 tablet tomorrow and then START 1/2 tablet daily except 1 table on Wednesday.  Recheck INR in 2 wks  Started Amio 200mg  BID x 1 month (05/18/2022) then QD.  [facility] [phone] Cardiac clearance form Fax to [phone]

## 2023-06-04 NOTE — Patient Instructions (Addendum)
Good to see you today!  Your physician has requested that you have an echocardiogram. Echocardiography is a painless test that uses sound waves to create images of your heart. It provides your doctor with information about the size and shape of your heart and how well your heart's chambers and valves are working. This procedure takes approximately one hour. There are no restrictions for this procedure. Please do NOT wear cologne, perfume, aftershave, or lotions (deodorant is allowed). Please arrive 15 minutes prior to your appointment time.  Your physician recommends that you schedule a follow-up appointment in: 6 months     If you have any questions or concerns before your next appointment please send [place] a message through [place] or call our office at [phone].    TO LEAVE A MESSAGE FOR THE NURSE SELECT OPTION 2, PLEASE LEAVE A MESSAGE INCLUDING: YOUR NAME DATE OF BIRTH CALL BACK NUMBER REASON FOR CALL**this is important as we prioritize the call backs  YOU WILL RECEIVE A CALL BACK THE SAME DAY AS LONG AS YOU CALL BEFORE 4:00 PM  At the [facility], you and your health needs are our priority. As part of our continuing mission to provide you with exceptional heart care, we have created designated Provider Care Teams. These Care Teams include your primary Cardiologist (physician) and Advanced Practice Providers (APPs- Physician Assistants and Nurse Practitioners) who all work together to provide you with the care you need, when you need it.   You may see any of the following providers on your designated Care Team at your next follow up: Dr Arvilla Meres Dr Marca Ancona Dr. Dorthula Nettles Dr. Clearnce Hasten Amy Filbert Schilder, NP Robbie Lis, [place] [facility] [place], [place] Brynda Peon, NP [place] Lee, NP Karle Plumber, PharmD   Please be sure to bring in all your medications bottles to every appointment.    Thank you for choosing [facility]

## 2023-06-04 NOTE — Telephone Encounter (Signed)
Patient Name: Mark [name]  DOB: 1961/03/13 MRN: [id number]  Primary Cardiologist: Lanier Prude, MD  Chart reviewed as part of pre-operative protocol coverage. Given past medical history and time since last visit, based on ACC/AHA guidelines, KONNAR BELLI is at acceptable risk for the planned procedure without further cardiovascular testing.   Procedure: colonoscopy Date of procedure: 06/30/23   CrCl 14mL/min using adjusted body weight Platelet count 162K   Per office protocol, patient can hold warfarin for 5 days prior to procedure. Patient will need bridging with Lovenox (enoxaparin) around procedure. This can be coordinated at [facility] where pt is followed.    The patient was advised that if he develops new symptoms prior to surgery to contact our office to arrange for a follow-up visit, and he verbalized understanding.  I will route this recommendation to the requesting party via Epic fax function and remove from pre-op pool.  Please call with questions.  Joni Reining, NP 06/04/2023, 4:24 PM

## 2023-06-07 ENCOUNTER — Telehealth: Payer: Self-pay

## 2023-06-07 NOTE — Telephone Encounter (Signed)
Received fax from cardiology advised patient okay to hold Warfarin 5 day's before procedure. Patient verbalized understanding.

## 2023-06-16 ENCOUNTER — Ambulatory Visit: Payer: Managed Care, Other (non HMO) | Attending: Internal Medicine | Admitting: *Deleted

## 2023-06-16 DIAGNOSIS — I4891 Unspecified atrial fibrillation: Secondary | ICD-10-CM

## 2023-06-16 DIAGNOSIS — Z952 Presence of prosthetic heart valve: Secondary | ICD-10-CM | POA: Diagnosis not present

## 2023-06-16 DIAGNOSIS — Z7901 Long term (current) use of anticoagulants: Secondary | ICD-10-CM

## 2023-06-16 LAB — POCT INR: INR: 2.8 (ref 2.0–3.0)

## 2023-06-16 MED ORDER — ENOXAPARIN SODIUM 120 MG/0.8ML IJ SOSY: 120.0000 mg | PREFILLED_SYRINGE | Freq: Two times a day (BID) | INTRAMUSCULAR | 1 refills | Status: DC

## 2023-06-16 NOTE — Patient Instructions (Addendum)
Description   Today take 1.5 tablets of warfarin and tomorrow take 1 tablet of warfarin then START 1/2 tablet daily except 1 table on Sunday and Wednesday. Follow instructions for upcoming procedure.  Recheck INR 1 wk post procedure.    Started Amio 200mg BID x 1 month (05/18/2022) then QD.  [facility] Clinic [phone] Cardiac clearance form Fax to [phone]     10 /24/24: Last dose of warfarin.  06/25/23: No warfarin or enoxaparin (Lovenox).  06/26/23: Inject enoxaparin 120mg  in the fatty abdominal tissue at least 2 inches from the belly button twice a day about 12 hours apart, 8am and 8pm rotate sites. No warfarin.  06/27/23: Inject enoxaparin in the fatty tissue every 12 hours, 8am and 8pm. No warfarin.  06/28/23: Inject enoxaparin in the fatty tissue every 12 hours, 8am and 8pm. No warfarin.  06/29/23: Inject enoxaparin in the fatty tissue in the morning at 8 am (No PM dose). No warfarin.  06/30/23: Procedure Day - No enoxaparin - Resume warfarin in the evening or as directed by doctor (take an extra half tablet with usual dose, total of 1.5 tablets).  07/01/23: Resume enoxaparin inject in the fatty tissue every 12 hours and take warfarin (take an extra half tablet with usual dose, total of 1 tablet).  07/02/23: Inject enoxaparin in the fatty tissue every 12 hours and take warfarin  07/03/23: Inject enoxaparin in the fatty tissue every 12 hours and take warfarin  07/04/23: Inject enoxaparin in the fatty tissue every 12 hours and take warfarin  07/05/23: Inject enoxaparin at 8am and report to warfarin appt to check INR.

## 2023-06-28 ENCOUNTER — Telehealth: Payer: Self-pay | Admitting: *Deleted

## 2023-06-28 ENCOUNTER — Ambulatory Visit (INDEPENDENT_AMBULATORY_CARE_PROVIDER_SITE_OTHER): Payer: Managed Care, Other (non HMO)

## 2023-06-28 DIAGNOSIS — I4819 Other persistent atrial fibrillation: Secondary | ICD-10-CM

## 2023-06-28 NOTE — Telephone Encounter (Signed)
Pt called and stated his lovenox injections are not at the pharmacy and they have not received the prescription.   Called the pharmacy and the pharmacy tech states they have the prescription and it is out of stock and they are ordering. She states it will be in this week asked if it will be within 48 hours and she stated it should and would reach out to the patient.   Called the pt back and update him on the above and he stated he does have a couple lovenox syringes left and will call back if unable to receive in the next 48 hours so we can send to another one and he verbalized understanding.

## 2023-06-29 LAB — CUP PACEART REMOTE DEVICE CHECK
Date Time Interrogation Session: 20241027230804
Implantable Pulse Generator Implant Date: 20221031

## 2023-06-30 ENCOUNTER — Encounter: Payer: Managed Care, Other (non HMO) | Admitting: Gastroenterology

## 2023-06-30 ENCOUNTER — Telehealth: Payer: Self-pay | Admitting: Gastroenterology

## 2023-06-30 NOTE — Telephone Encounter (Signed)
Called patient and he said he could not tolerate the suprep. He tried last evening and was up vomiting off and on. He tried again this morning and was unsuccessful and threw the rest of the prep away.  He wishes to reschedule and strongly prefers to try the Sutab instead and maybe try zofran as well. He was rescheduled for 08/12/23 and a previsit on 08/02/23.

## 2023-06-30 NOTE — Telephone Encounter (Signed)
Ok, please send prescription for Sutab and Zofran 4 mg ODT every 8 hours as needed to take prior to starting bowel prep and use as needed.  Thank you

## 2023-06-30 NOTE — Telephone Encounter (Signed)
Inbound call from patient requesting to reschedule today's 10/30 colonoscopy at 3. Stated he tried to drink prep medication last night and this morning but it made him very sick and was not able to keep prep down. Patient is wishing to be reschedule colonoscopy. Requesting a soon appointment due to taking blood thinners and having to hold on medication. Patient is requesting a follow up call to discuss different options for prep and rescheduling. Please advise, thank you.

## 2023-07-05 ENCOUNTER — Encounter (HOSPITAL_COMMUNITY): Payer: Self-pay | Admitting: Cardiology

## 2023-07-05 ENCOUNTER — Ambulatory Visit (INDEPENDENT_AMBULATORY_CARE_PROVIDER_SITE_OTHER): Payer: Commercial Managed Care - HMO

## 2023-07-05 ENCOUNTER — Ambulatory Visit (HOSPITAL_COMMUNITY)
Admission: RE | Admit: 2023-07-05 | Discharge: 2023-07-05 | Disposition: A | Discharge status: Home / Self Care | Payer: Commercial Managed Care - HMO | Source: Ambulatory Visit | Attending: Internal Medicine | Admitting: Internal Medicine

## 2023-07-05 DIAGNOSIS — I739 Peripheral vascular disease, unspecified: Secondary | ICD-10-CM | POA: Diagnosis not present

## 2023-07-05 DIAGNOSIS — Z950 Presence of cardiac pacemaker: Secondary | ICD-10-CM | POA: Diagnosis not present

## 2023-07-05 DIAGNOSIS — Z952 Presence of prosthetic heart valve: Secondary | ICD-10-CM | POA: Insufficient documentation

## 2023-07-05 DIAGNOSIS — I4891 Unspecified atrial fibrillation: Secondary | ICD-10-CM

## 2023-07-05 DIAGNOSIS — I7121 Aneurysm of the ascending aorta, without rupture: Secondary | ICD-10-CM | POA: Diagnosis not present

## 2023-07-05 DIAGNOSIS — I509 Heart failure, unspecified: Secondary | ICD-10-CM | POA: Insufficient documentation

## 2023-07-05 DIAGNOSIS — Z7901 Long term (current) use of anticoagulants: Secondary | ICD-10-CM | POA: Diagnosis not present

## 2023-07-05 DIAGNOSIS — G4730 Sleep apnea, unspecified: Secondary | ICD-10-CM | POA: Insufficient documentation

## 2023-07-05 DIAGNOSIS — E785 Hyperlipidemia, unspecified: Secondary | ICD-10-CM | POA: Diagnosis not present

## 2023-07-05 DIAGNOSIS — I5032 Chronic diastolic (congestive) heart failure: Secondary | ICD-10-CM

## 2023-07-05 LAB — POCT INR: INR: 2.6 (ref 2.0–3.0)

## 2023-07-05 LAB — ECHOCARDIOGRAM COMPLETE
AR max vel: 1.24 cm2
AV Area VTI: 1.22 cm2
AV Area mean vel: 1.13 cm2
AV Mean grad: 25.4 mm[Hg]
AV Peak grad: 47.7 mm[Hg]
Ao pk vel: 3.45 m/s
Area-P 1/2: 3.6 cm2
MV VTI: 2.07 cm2
S' Lateral: 3.9 cm

## 2023-07-05 NOTE — Patient Instructions (Signed)
TAKE 1 TABLET TODAY and TUESDAY THEN CONTINUE 1/2 tablet daily except 1 tablet on Sunday and Wednesday. Colonoscopy 12/12 Lovenox Bridging. INR in 2 wks  Started Amio 200mg  BID x 1 month (05/18/2022) then QD.  C[facility]in Clinic [phone] Cardiac clearance form Fax to [phone]

## 2023-07-14 ENCOUNTER — Telehealth (HOSPITAL_COMMUNITY): Payer: Self-pay

## 2023-07-14 NOTE — Telephone Encounter (Signed)
Patient called wanting to know if Dr. Gala Romney would be willing to see his wife. He wanted to ask you personally.

## 2023-07-14 NOTE — Progress Notes (Signed)
Carelink Summary Report / Loop Recorder 

## 2023-07-19 ENCOUNTER — Telehealth: Payer: Self-pay

## 2023-07-19 ENCOUNTER — Ambulatory Visit: Payer: Commercial Managed Care - HMO | Attending: Cardiology

## 2023-07-19 DIAGNOSIS — Z7901 Long term (current) use of anticoagulants: Secondary | ICD-10-CM | POA: Diagnosis not present

## 2023-07-19 DIAGNOSIS — Z952 Presence of prosthetic heart valve: Secondary | ICD-10-CM | POA: Diagnosis not present

## 2023-07-19 DIAGNOSIS — I4891 Unspecified atrial fibrillation: Secondary | ICD-10-CM | POA: Diagnosis not present

## 2023-07-19 LAB — POCT INR: INR: 4.6 — AB (ref 2.0–3.0)

## 2023-07-19 NOTE — Patient Instructions (Signed)
CONTINUE 1/2 tablet daily except 1 tablet on Sunday and Wednesday. EAT GREENS TONIGHT.  Colonoscopy 12/12 Lovenox Bridging. INR in 2 wks  Started Amio 200mg  BID x 1 month (05/18/2022) then QD.  Cou[facility]in Clinic [phone] Cardiac clearance form Fax to [phone]

## 2023-07-19 NOTE — Telephone Encounter (Signed)
Patient is scheduled for Pre-Visit on Monday 08/02/23. We did receive cardiology clearance to hold Warfarin 5 days prior to his procedure. Patient is supposed to receive instructions for Lovenox bridge from the [facility]. Please follow up to make sure the patient does receive these instructions. Thanks! Janalee Dane, LPN

## 2023-07-21 NOTE — Telephone Encounter (Signed)
noted 

## 2023-07-26 ENCOUNTER — Other Ambulatory Visit (HOSPITAL_COMMUNITY): Payer: Self-pay | Admitting: Internal Medicine

## 2023-07-27 ENCOUNTER — Other Ambulatory Visit (HOSPITAL_COMMUNITY): Payer: Self-pay | Admitting: *Deleted

## 2023-08-01 LAB — CUP PACEART REMOTE DEVICE CHECK
Date Time Interrogation Session: 20241129230242
Implantable Pulse Generator Implant Date: 20221031

## 2023-08-02 ENCOUNTER — Ambulatory Visit (AMBULATORY_SURGERY_CENTER): Payer: Commercial Managed Care - HMO

## 2023-08-02 ENCOUNTER — Ambulatory Visit: Payer: Commercial Managed Care - HMO

## 2023-08-02 ENCOUNTER — Encounter: Payer: Self-pay | Admitting: Gastroenterology

## 2023-08-02 VITALS — Ht 70.0 in | Wt 240.0 lb

## 2023-08-02 DIAGNOSIS — I4891 Unspecified atrial fibrillation: Secondary | ICD-10-CM

## 2023-08-02 DIAGNOSIS — R195 Other fecal abnormalities: Secondary | ICD-10-CM

## 2023-08-02 MED ORDER — ONDANSETRON HCL 4 MG PO TABS: 4.0000 mg | ORAL_TABLET | ORAL | 0 refills | Status: DC

## 2023-08-02 MED ORDER — SUTAB 1479-225-188 MG PO TABS: 12.0000 | ORAL_TABLET | ORAL | 0 refills | Status: DC

## 2023-08-02 NOTE — Progress Notes (Signed)
No egg or soy allergy known to patient  No issues known to pt with past sedation with any surgeries or procedures Patient denies ever being told they had issues or difficulty with intubation  No FH of Malignant Hyperthermia Pt is not on diet pills Pt is not on  home 02  OSA not using cpap  Pt is not on blood thinners  Pt denies issues with constipation  Hx A Fib  Have any cardiac testing pending-- no  LOA: independent  Prep: sutab   Patient's chart reviewed by Cathlyn Parsons CNRA prior to previsit and patient appropriate for the [facility].  Previsit completed and red dot placed by patient's name on their procedure day (on provider's schedule).     PV competed with patient. Prep instructions sent via mychart and home address. Goodrx coupon for provided to use for price reduction if needed.

## 2023-08-05 ENCOUNTER — Ambulatory Visit: Payer: Commercial Managed Care - HMO | Attending: Internal Medicine

## 2023-08-05 ENCOUNTER — Telehealth: Payer: Self-pay | Admitting: *Deleted

## 2023-08-05 DIAGNOSIS — Z952 Presence of prosthetic heart valve: Secondary | ICD-10-CM

## 2023-08-05 NOTE — Telephone Encounter (Signed)
Pt missed  coumadin appointment today. Pt needs INR drawn because he needs to be bridged with Lovenox for upcoming procedure. Lab order placed for Pt/INR.

## 2023-08-06 ENCOUNTER — Encounter: Payer: Self-pay | Admitting: *Deleted

## 2023-08-06 ENCOUNTER — Ambulatory Visit (INDEPENDENT_AMBULATORY_CARE_PROVIDER_SITE_OTHER): Payer: Self-pay | Admitting: Cardiology

## 2023-08-06 DIAGNOSIS — Z952 Presence of prosthetic heart valve: Secondary | ICD-10-CM | POA: Diagnosis not present

## 2023-08-06 DIAGNOSIS — Z7901 Long term (current) use of anticoagulants: Secondary | ICD-10-CM

## 2023-08-06 DIAGNOSIS — I4819 Other persistent atrial fibrillation: Secondary | ICD-10-CM

## 2023-08-06 LAB — PROTIME-INR
INR: 3.1 — ABNORMAL HIGH (ref 0.9–1.2)
Prothrombin Time: 31.8 s — ABNORMAL HIGH (ref 9.1–12.0)

## 2023-08-06 MED ORDER — ENOXAPARIN SODIUM 100 MG/ML IJ SOSY: 100.0000 mg | PREFILLED_SYRINGE | Freq: Two times a day (BID) | INTRAMUSCULAR | 1 refills | Status: DC

## 2023-08-06 NOTE — Patient Instructions (Addendum)
Description   Spoke with pt and advised to take 1 tablet of warfarin today then follow upcoming bridging instructions for procedure. Post procedure on day 3 will resume warfarin 1/2 tablet daily except 1 tablet on Sunday and Wednesday. Colonoscopy 12/12 Lovenox Bridging. INR in 1 weeks post procedure.  Started Amio 200mg BID x 1 month (05/18/2022) then QD.  Coumadin Clinic [phone] Cardiac clearance form Fax to [phone]     12 /6/24: Last dose of warfarin.  08/07/23: No warfarin or enoxaparin (Lovenox).  08/08/23: Inject enoxaparin 100mg  in the fatty abdominal tissue at least 2 inches from the belly button twice a day about 12 hours apart, 8am and 8pm rotate sites. No warfarin.  08/09/23: Inject enoxaparin in the fatty tissue every 12 hours, 8am and 8pm. No warfarin.  08/10/23: Inject enoxaparin in the fatty tissue every 12 hours, 8am and 8pm. No warfarin.  08/11/23: Inject enoxaparin in the fatty tissue in the morning at 8 am (No PM dose). No warfarin.  08/12/23: Procedure Day - No enoxaparin - Resume warfarin in the evening or as directed by doctor (take an extra half tablet with usual dose for 2 days then resume normal dose).  08/13/23: Resume enoxaparin inject in the fatty tissue every 12 hours and take warfarin  08/14/23: Inject enoxaparin in the fatty tissue every 12 hours and take warfarin  08/15/23: Inject enoxaparin in the fatty tissue every 12 hours and take warfarin  08/16/23: Inject enoxaparin in the fatty tissue every 12 hours and take warfarin  08/17/23: Inject enoxaparin in the fatty tissue every 12 hours and take warfarin  08/18/23: Inject enoxaparin in the fatty tissue every 12 hours and take warfarin  08/19/23: Inject enoxaparin in the fatty tissue in the morning and warfarin appt to check INR at 1045a.

## 2023-08-10 ENCOUNTER — Encounter: Payer: Self-pay | Admitting: Certified Registered Nurse Anesthetist

## 2023-08-10 NOTE — Progress Notes (Unsigned)
  Electrophysiology Office Note:   Date:  08/11/2023  ID:  Mark [name], DOB [birth year], MRN [id number]  Primary Cardiologist: Lanier Prude, MD Electrophysiologist: Lanier Prude, MD      History of Present Illness:   Mark [name] is a 62 y.o. male with h/o HTN, AF and multiple ablations, Mechanical AVR, and coumadin use. seen today for routine electrophysiology followup.   Since last being seen in our clinic the patient reports doing very well. He has brief palpitations from time to time, but 0% burden of AF on his device. Very low PVC burden.  he denies chest pain, palpitations, dyspnea, PND, orthopnea, nausea, vomiting, dizziness, syncope, edema, weight gain, or early satiety.   Review of systems complete and found to be negative unless listed in HPI.    EP Information / Studies Reviewed:    EKG is ordered today. Personal review as below.  EKG Interpretation Date/Time:  Wednesday August 11 2023 08:08:56 EST Ventricular Rate:  47 PR Interval:  184 QRS Duration:  120 QT Interval:  456 QTC Calculation: 403 R Axis:   67  Text Interpretation: Sinus bradycardia Non-specific intra-ventricular conduction delay Abnormal QRS-T angle, consider primary T wave abnormality When compared with ECG of 26-Aug-2022 14:33, No significant change was found Confirmed by Maxine Glenn [phone]) on 08/11/2023 8:30:20 AM    Arrhythmia History  S/p Ablation x3 03/2012 10/2012 11/2021  Failed Tikosyn due to recurrence.  Restarted on amiodarone with poor options despite young age.  Device History Loop recorder 2014 -> 2018 -> 05/2021 (linq 2)  Physical Exam:   VS:  BP 124/68   Pulse (!) 47   Ht 5\' 10"  (1.778 m)   Wt 247 lb 3.2 oz (112.1 kg)   SpO2 98%   BMI 35.47 kg/m    Wt Readings from Last 3 Encounters:  08/11/23 247 lb 3.2 oz (112.1 kg)  08/02/23 240 lb (108.9 kg)  06/04/23 244 lb (110.7 kg)     GEN: Well nourished, well developed in no acute  distress NECK: No JVD; No carotid bruits CARDIAC: Regular rate and rhythm, no murmurs, rubs, gallops RESPIRATORY:  Clear to auscultation without rales, wheezing or rhonchi  ABDOMEN: Soft, non-tender, non-distended EXTREMITIES:  No edema; No deformity   ASSESSMENT AND PLAN:    Persistent AF Had breakthrough despit tikosyn and ablation x 3 Continue amiodarone 200 mg daily Surveillance labs today Continue coumadin  Secondary hypercoagulable state Pt on Coumadin as above  CHA2DS2VASc  is at least 3  HTN Stable on current regimen   OSA  Encouraged nightly CPAP   Follow up with Dr. Lalla Brothers in 6 months  Signed, Graciella Freer, PA-C

## 2023-08-11 ENCOUNTER — Ambulatory Visit: Payer: Commercial Managed Care - HMO | Attending: Student | Admitting: Student

## 2023-08-11 ENCOUNTER — Encounter: Payer: Self-pay | Admitting: Student

## 2023-08-11 VITALS — BP 124/68 | HR 47 | Ht 70.0 in | Wt 247.2 lb

## 2023-08-11 DIAGNOSIS — G4733 Obstructive sleep apnea (adult) (pediatric): Secondary | ICD-10-CM | POA: Diagnosis not present

## 2023-08-11 DIAGNOSIS — Z952 Presence of prosthetic heart valve: Secondary | ICD-10-CM

## 2023-08-11 DIAGNOSIS — I4819 Other persistent atrial fibrillation: Secondary | ICD-10-CM

## 2023-08-11 DIAGNOSIS — Z7901 Long term (current) use of anticoagulants: Secondary | ICD-10-CM

## 2023-08-11 LAB — COMPREHENSIVE METABOLIC PANEL
ALT: 30 [IU]/L (ref 0–44)
AST: 25 [IU]/L (ref 0–40)
Albumin: 4.5 g/dL (ref 3.8–4.9)
Alkaline Phosphatase: 83 [IU]/L (ref 44–121)
BUN/Creatinine Ratio: 14 (ref 10–24)
BUN: 21 mg/dL (ref 8–27)
Bilirubin Total: 0.7 mg/dL (ref 0.0–1.2)
CO2: 21 mmol/L (ref 20–29)
Calcium: 9 mg/dL (ref 8.6–10.2)
Chloride: 107 mmol/L — ABNORMAL HIGH (ref 96–106)
Creatinine, Ser: 1.51 mg/dL — ABNORMAL HIGH (ref 0.76–1.27)
Globulin, Total: 2.4 g/dL (ref 1.5–4.5)
Glucose: 103 mg/dL — ABNORMAL HIGH (ref 70–99)
Potassium: 4.8 mmol/L (ref 3.5–5.2)
Sodium: 142 mmol/L (ref 134–144)
Total Protein: 6.9 g/dL (ref 6.0–8.5)
eGFR: 53 mL/min/{1.73_m2} — ABNORMAL LOW (ref >59)

## 2023-08-11 LAB — T4, FREE: Free T4: 1.5 ng/dL (ref 0.82–1.77)

## 2023-08-11 LAB — TSH: TSH: 2.21 u[IU]/mL (ref 0.450–4.500)

## 2023-08-11 NOTE — Patient Instructions (Signed)
Medication Instructions:  Your physician recommends that you continue on your current medications as directed. Please refer to the Current Medication list given to you today.  *If you need a refill on your cardiac medications before your next appointment, please call your pharmacy*  Lab Work: CMET, TSH, FreeT4-TODAY If you have labs (blood work) drawn today and your tests are completely normal, you will receive your results only by: MyChart Message (if you have MyChart) OR A paper copy in the mail If you have any lab test that is abnormal or we need to change your treatment, we will call you to review the results.  Follow-Up: At [facility], you and your health needs are our priority.  As part of our continuing mission to provide you with exceptional heart care, we have created designated Provider Care Teams.  These Care Teams include your primary Cardiologist (physician) and Advanced Practice Providers (APPs -  Physician Assistants and Nurse Practitioners) who all work together to provide you with the care you need, when you need it.  Your next appointment:   6 month(s)  Provider:   Steffanie Dunn, MD or [place]" [place], [place]

## 2023-08-12 ENCOUNTER — Encounter: Payer: Self-pay | Admitting: Gastroenterology

## 2023-08-12 ENCOUNTER — Ambulatory Visit: Payer: Commercial Managed Care - HMO | Admitting: Gastroenterology

## 2023-08-12 VITALS — BP 90/60 | HR 48 | Temp 97.5°F | Resp 14 | Ht 70.0 in | Wt 240.0 lb

## 2023-08-12 DIAGNOSIS — K5730 Diverticulosis of large intestine without perforation or abscess without bleeding: Secondary | ICD-10-CM

## 2023-08-12 DIAGNOSIS — Z1211 Encounter for screening for malignant neoplasm of colon: Secondary | ICD-10-CM

## 2023-08-12 DIAGNOSIS — R195 Other fecal abnormalities: Secondary | ICD-10-CM

## 2023-08-12 DIAGNOSIS — K648 Other hemorrhoids: Secondary | ICD-10-CM | POA: Diagnosis not present

## 2023-08-12 DIAGNOSIS — K644 Residual hemorrhoidal skin tags: Secondary | ICD-10-CM

## 2023-08-12 DIAGNOSIS — D122 Benign neoplasm of ascending colon: Secondary | ICD-10-CM

## 2023-08-12 MED ORDER — SODIUM CHLORIDE 0.9 % IV SOLN: 500.0000 mL | Freq: Once | INTRAVENOUS | Status: DC

## 2023-08-12 NOTE — Progress Notes (Signed)
Pt's states no medical or surgical changes since previsit or office visit. 

## 2023-08-12 NOTE — Op Note (Signed)
[name] Procedure Date: 08/12/2023 11:13 AM MRN: [id number] Endoscopist: Napoleon Form , MD, [id number] Age: 62 Referring MD:  Date of Birth: 1961-08-07 Gender: Male Account #: [id number] Procedure:                Colonoscopy Indications:              Screening for colorectal malignant neoplasm Medicines:                Monitored Anesthesia Care Procedure:                Pre-Anesthesia Assessment:                           - Prior to the procedure, a History and Physical                            was performed, and patient medications and                            allergies were reviewed. The patient's tolerance of                            previous anesthesia was also reviewed. The risks                            and benefits of the procedure and the sedation                            options and risks were discussed with the patient.                            All questions were answered, and informed consent                            was obtained. Prior Anticoagulants: The patient                            last took Coumadin (warfarin) 5 days prior to the                            procedure. ASA Grade Assessment: III - A patient                            with severe systemic disease. After reviewing the                            risks and benefits, the patient was deemed in                            satisfactory condition to undergo the procedure.                           After obtaining informed consent, the colonoscope  was passed under direct vision. Throughout the                            procedure, the patient's blood pressure, pulse, and                            oxygen saturations were monitored continuously. The                            Olympus Scope SN [id number] was introduced through the                            anus and advanced to the the cecum, identified by                             appendiceal orifice and ileocecal valve. The                            colonoscopy was performed without difficulty. The                            patient tolerated the procedure well. The quality                            of the bowel preparation was good. The ileocecal                            valve, appendiceal orifice, and rectum were                            photographed. Scope In: 11:19:56 AM Scope Out: 11:32:58 AM Scope Withdrawal Time: 0 hours 9 minutes 15 seconds  Total Procedure Duration: 0 hours 13 minutes 2 seconds  Findings:                 The perianal and digital rectal examinations were                            normal.                           A 5 mm polyp was found in the ascending colon. The                            polyp was sessile. The polyp was removed with a                            cold snare. Resection and retrieval were complete.                           A few small-mouthed diverticula were found in the                            sigmoid colon.  Non-bleeding external and internal hemorrhoids were                            found during retroflexion. The hemorrhoids were                            medium-sized. Complications:            No immediate complications. Estimated Blood Loss:     Estimated blood loss was minimal. Impression:               - One 5 mm polyp in the ascending colon, removed                            with a cold snare. Resected and retrieved.                           - Diverticulosis in the sigmoid colon.                           - Non-bleeding external and internal hemorrhoids. Recommendation:           - Patient has a contact number available for                            emergencies. The signs and symptoms of potential                            delayed complications were discussed with the                            patient. Return to normal activities tomorrow.                             Written discharge instructions were provided to the                            patient.                           - Resume previous diet.                           - Continue present medications.                           - Await pathology results.                           - Repeat colonoscopy in 5-10 years for surveillance                            based on pathology results.                           - Resume Coumadin (warfarin) at prior dose today.  Refer to Coumadin Clinic for further adjustment of                            therapy. Napoleon Form, MD 08/12/2023 11:38:20 AM This report has been signed electronically.

## 2023-08-12 NOTE — Progress Notes (Signed)
Report given to PACU, vss 

## 2023-08-12 NOTE — Progress Notes (Signed)
Called to room to assist during endoscopic procedure.  Patient ID and intended procedure confirmed with present staff. Received instructions for my participation in the procedure from the performing physician.  

## 2023-08-12 NOTE — Patient Instructions (Addendum)
-   Resume previous diet. - Continue present medications. - Await pathology results. - Repeat colonoscopy in 5-10 years for surveillance based on pathology results. - Resume Coumadin (warfarin) at prior dose today. Refer to Coumadin Clinic for further adjustment of therapy.  YOU HAD AN ENDOSCOPIC PROCEDURE TODAY AT THE [facility]:   Refer to the procedure report that was given to you for any specific questions about what was found during the examination.  If the procedure report does not answer your questions, please call your gastroenterologist to clarify.  If you requested that your care partner not be given the details of your procedure findings, then the procedure report has been included in a sealed envelope for you to review at your convenience later.  YOU SHOULD EXPECT: Some feelings of bloating in the abdomen. Passage of more gas than usual.  Walking can help get rid of the air that was put into your GI tract during the procedure and reduce the bloating. If you had a lower endoscopy (such as a colonoscopy or flexible sigmoidoscopy) you may notice spotting of blood in your stool or on the toilet paper. If you underwent a bowel prep for your procedure, you may not have a normal bowel movement for a few days.  Please Note:  You might notice some irritation and congestion in your nose or some drainage.  This is from the oxygen used during your procedure.  There is no need for concern and it should clear up in a day or so.  SYMPTOMS TO REPORT IMMEDIATELY:  Following lower endoscopy (colonoscopy or flexible sigmoidoscopy):  Excessive amounts of blood in the stool  Significant tenderness or worsening of abdominal pains  Swelling of the abdomen that is new, acute  Fever of 100F or higher  For urgent or emergent issues, a gastroenterologist can be reached at any hour by calling [phone]. Do not use MyChart messaging for urgent concerns.    DIET:  We do recommend a small meal  at first, but then you may proceed to your regular diet.  Drink plenty of fluids but you should avoid alcoholic beverages for 24 hours.  ACTIVITY:  You should plan to take it easy for the rest of today and you should NOT DRIVE or use heavy machinery until tomorrow (because of the sedation medicines used during the test).    FOLLOW UP: Our staff will call the number listed on your records the next business day following your procedure.  We will call around 7:15- 8:00 am to check on you and address any questions or concerns that you may have regarding the information given to you following your procedure. If we do not reach you, we will leave a message.     If any biopsies were taken you will be contacted by phone or by letter within the next 1-3 weeks.  Please call us at [phone] if you have not heard about the biopsies in 3 weeks.    SIGNATURES/CONFIDENTIALITY: You and/or your care partner have signed paperwork which will be entered into your electronic medical record.  These signatures attest to the fact that that the information above on your After Visit Summary has been reviewed and is understood.  Full responsibility of the confidentiality of this discharge information lies with you and/or your care-partner.
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
## 2023-08-13 ENCOUNTER — Telehealth: Payer: Self-pay | Admitting: *Deleted

## 2023-08-13 NOTE — Telephone Encounter (Signed)
  Follow up Call-     08/12/2023   10:52 AM  Call back number  Post procedure Call Back phone  # [phone]  Permission to leave phone message Yes     Patient questions:  Do you have a fever, pain , or abdominal swelling? No. Pain Score  0 *  Have you tolerated food without any problems? Yes.    Have you been able to return to your normal activities? Yes.    Do you have any questions about your discharge instructions: Diet   No. Medications  No. Follow up visit  No.  Do you have questions or concerns about your Care? Yes.    Actions: * If pain score is 4 or above: No action needed, pain <4.

## 2023-08-16 ENCOUNTER — Ambulatory Visit (INDEPENDENT_AMBULATORY_CARE_PROVIDER_SITE_OTHER): Payer: Commercial Managed Care - HMO | Admitting: Family Medicine

## 2023-08-16 ENCOUNTER — Encounter: Payer: Self-pay | Admitting: Family Medicine

## 2023-08-16 VITALS — BP 102/62 | HR 52 | Temp 97.8°F | Ht 70.0 in | Wt 249.2 lb

## 2023-08-16 DIAGNOSIS — N289 Disorder of kidney and ureter, unspecified: Secondary | ICD-10-CM | POA: Diagnosis not present

## 2023-08-16 NOTE — Patient Instructions (Signed)
Follow up as needed or as scheduled We'll call you to schedule your kidney appt Keep drinking LOTS of water Call with any questions or concerns Stay Safe!  Stay Healthy! Happy Holidays!!!

## 2023-08-16 NOTE — Progress Notes (Signed)
   Subjective:    Patient ID: Mark [name], male    DOB: November 21, 1960, 62 y.o.   MRN: [id number]  HPI Lab discussion- pt has questions regarding his Creatinine.  He tried to ask Cardiology (who drew the labs) but was told to ask PCP.  He reports he is drinking 'tons' of water and isn't sure he can drink any more to improve his Cr.   Review of Systems For ROS see HPI     Objective:   Physical Exam Vitals reviewed.  Constitutional:      General: He is not in acute distress.    Appearance: Normal appearance. He is not ill-appearing.  HENT:     Head: Normocephalic and atraumatic.  Musculoskeletal:     Right lower leg: No edema.     Left lower leg: No edema.  Skin:    General: Skin is warm and dry.  Neurological:     General: No focal deficit present.     Mental Status: He is alert and oriented to person, place, and time.  Psychiatric:        Mood and Affect: Mood normal.        Behavior: Behavior normal.        Thought Content: Thought content normal.           Assessment & Plan:   Mild renal insufficiency- ongoing issue for pt.  Creatinine is stable when compared to 3 months ago and lower than June but still elevated.  He is on multiple medications that can impact his kidney function- including amiodarone.  Will refer to Nephrology for a complete evaluation and for them to follow along.  Pt expressed understanding and is in agreement w/ plan.

## 2023-08-17 LAB — SURGICAL PATHOLOGY

## 2023-08-18 ENCOUNTER — Other Ambulatory Visit: Payer: Self-pay | Admitting: Surgery

## 2023-08-18 DIAGNOSIS — I7121 Aneurysm of the ascending aorta, without rupture: Secondary | ICD-10-CM

## 2023-08-19 ENCOUNTER — Ambulatory Visit: Payer: Commercial Managed Care - HMO | Attending: Internal Medicine

## 2023-08-19 DIAGNOSIS — Z952 Presence of prosthetic heart valve: Secondary | ICD-10-CM

## 2023-08-19 DIAGNOSIS — I4891 Unspecified atrial fibrillation: Secondary | ICD-10-CM

## 2023-08-19 LAB — POCT INR: INR: 3.6 — AB (ref 2.0–3.0)

## 2023-08-19 NOTE — Patient Instructions (Signed)
Description   Discontinue Lovenox injections. Continue warfarin 1/2 tablet daily except 1 tablet on Sunday and Wednesday.  Recheck INR in 2 weeks.  Started Amio 200mg  BID x 1 month (05/18/2022) then QD.  Coumadin Clinic [phone] Cardiac clearance form Fax to [phone]

## 2023-08-20 ENCOUNTER — Ambulatory Visit (HOSPITAL_COMMUNITY)
Admission: RE | Admit: 2023-08-20 | Discharge: 2023-08-20 | Disposition: A | Discharge status: Home / Self Care | Payer: Commercial Managed Care - HMO | Source: Ambulatory Visit | Attending: Internal Medicine | Admitting: Internal Medicine

## 2023-08-20 ENCOUNTER — Other Ambulatory Visit: Payer: Self-pay

## 2023-08-20 VITALS — BP 104/68 | HR 47 | Ht 70.0 in | Wt 248.6 lb

## 2023-08-20 DIAGNOSIS — I4819 Other persistent atrial fibrillation: Secondary | ICD-10-CM

## 2023-08-20 DIAGNOSIS — I110 Hypertensive heart disease with heart failure: Secondary | ICD-10-CM | POA: Insufficient documentation

## 2023-08-20 DIAGNOSIS — Z7984 Long term (current) use of oral hypoglycemic drugs: Secondary | ICD-10-CM | POA: Diagnosis not present

## 2023-08-20 DIAGNOSIS — I350 Nonrheumatic aortic (valve) stenosis: Secondary | ICD-10-CM | POA: Insufficient documentation

## 2023-08-20 DIAGNOSIS — D6869 Other thrombophilia: Secondary | ICD-10-CM

## 2023-08-20 DIAGNOSIS — Z79899 Other long term (current) drug therapy: Secondary | ICD-10-CM | POA: Diagnosis not present

## 2023-08-20 DIAGNOSIS — I509 Heart failure, unspecified: Secondary | ICD-10-CM | POA: Diagnosis not present

## 2023-08-20 DIAGNOSIS — I4891 Unspecified atrial fibrillation: Secondary | ICD-10-CM

## 2023-08-20 DIAGNOSIS — Z5181 Encounter for therapeutic drug level monitoring: Secondary | ICD-10-CM | POA: Diagnosis not present

## 2023-08-20 DIAGNOSIS — Z8616 Personal history of COVID-19: Secondary | ICD-10-CM | POA: Insufficient documentation

## 2023-08-20 DIAGNOSIS — Z952 Presence of prosthetic heart valve: Secondary | ICD-10-CM | POA: Insufficient documentation

## 2023-08-20 DIAGNOSIS — I2510 Atherosclerotic heart disease of native coronary artery without angina pectoris: Secondary | ICD-10-CM | POA: Diagnosis not present

## 2023-08-20 DIAGNOSIS — Z7901 Long term (current) use of anticoagulants: Secondary | ICD-10-CM | POA: Insufficient documentation

## 2023-08-20 DIAGNOSIS — G4733 Obstructive sleep apnea (adult) (pediatric): Secondary | ICD-10-CM | POA: Diagnosis not present
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
## 2023-08-30 ENCOUNTER — Other Ambulatory Visit (HOSPITAL_COMMUNITY): Payer: Self-pay | Admitting: Internal Medicine

## 2023-08-31 ENCOUNTER — Other Ambulatory Visit: Payer: Self-pay

## 2023-08-31 DIAGNOSIS — I4891 Unspecified atrial fibrillation: Secondary | ICD-10-CM

## 2023-08-31 DIAGNOSIS — Z952 Presence of prosthetic heart valve: Secondary | ICD-10-CM

## 2023-08-31 MED ORDER — WARFARIN SODIUM 10 MG PO TABS: ORAL_TABLET | ORAL | 0 refills | Status: DC

## 2023-09-05 NOTE — Progress Notes (Signed)
 Patient ID: Mark [name], male   DOB: 10-19-1960, 63 y.o.   MRN: [id number]    Advanced [facility] Failure Clinic Note    PCP: [name] HF: Dr. Cherrie   HPI:  [name] is a 63 year old male with a history of bicuspid aortic valve complicated by endocarditis.  He is status post St. Jude mechanical aortic valve replacement in 1983.  He also has a history of hyperlipidemia and PAF s/p DC-CV in 9/11. OSA noncompliant with CPAP.   Over the past few years year, he has had some progression in the gradients across his valve.  He underwent cardiac catheterization in May 2009 which showed normal coronary arteries.The valve leaflets were seen to be opening well on fluoroscopy.  Had TEE.  While there was some turbulence around the valve, the leaflets seemed to be moving well.  There was no obvious pannus formation.  Gradient on his transthoracic echo was within the moderate range at 33. 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 He was seen by Dr. Dusty who agreed  with continue watchul waiting.   Hasalso struggled with AF/palpitations.  Has seen Dr. Kelsie and had two ablations in 8/13 and 3/14. Now has Linq monitor in (placed 04/06/17). Seen in AF Clinic in 7/21 AF burden was low. Most recent device interrogation from 12/21 reviewed AF burden 13.9% with longest event 49 hr duration (overall AF burden over life of ILR ~5%).  CT chest 4/22: Stable dilation of ascending aorta 4.8 cm CT chest 5/24 AscAo 5.2cm new pulmonary nodule - Has seen Dr. Lucas. Continue f/u for now  Echo 10/22 EF 60% moderate AS mean gradient Personally reviewed Echo 08/26/22 EF 50-55% moderate AS mean gradient Asc ao 5.0 Personally reviewed  Underwent excision of large blood-filled pre-patellar bursa on his leg on 05/10/23.   Echo 07/05/23: EF 50-55% RV ok. AVR MG 27 mmHg, EOA 1.2 cm^2, DI 0.31   He is here with his wife for f/u.  Still with chest pain and SOB. Feels like it is coming more  frequently. Can come at any time. Not necessarily worse with walking. + LE edema and occasional wheezing. Feels lightheaded frequently, Can happen at any time. Worse with standing. SBP typically in 120s when he takes it.    Cardiac studies:  Echo 2/21: EF 60-65% Aortic valve mean gradient measures 29.0 mmHg. Aortic valve peak gradient  measures 57.8 mmHg. AVA 1.2 cm2 Personally reviewed  Echo 6/19: EF 55-60% AoV Mean gradient (S): 27 mm Hg  Echo 7/18  EF 55-60% AoV Mean gradient (S): 26 mm Hg. Peak gradient (S): 51 mm Hg. AVA 1.35 cm2 Echo 12/14/2014 LVEF 55-60%, Aortic valve gradient 44 to 60 mm Hg, increased from 22 to 35 mm Hg. Echo 4/17 EF 60-65%  AoV Mean gradient (S): 34 mm Hg. Peak gradient (S): 63 mm Hg.  Review of systems complete and found to be negative unless listed in HPI.   Past Medical History:  Diagnosis Date   ADHD (attention deficit hyperactivity disorder)    Aortic stenosis    a. due to bicuspid AoV; 1983 S/p mechanical AVR (St. Jude) - chronic coumadin  with INR run between 3.5-4.5;  b. 03/2012 Echo: EF 60%, nl wall motion, mod dil LA.   Arthritis    Ascending aortic aneurysm [facility])    Atrial tachycardia (HCC)    a. admitted 07/2012   CAD (coronary artery disease)    CHF (congestive heart failure) (HCC)  COVID janurary 2022   Dysrhythmia    ED (erectile dysfunction)    Gallstones    Headache(784.0)    Hepatitis A    a. as a child (from [place])   History of kidney stones    Hypertension    OSA (obstructive sleep apnea)    a. noncompliant with CPAP   Paroxysmal atrial fibrillation (HCC)    s/p afib ablation x 2   Pneumonia     Current Outpatient Medications  Medication Sig Dispense Refill   amiodarone  (PACERONE ) 200 MG tablet TAKE 1 TABLET BY MOUTH EVERY DAY 30 tablet 5   aspirin  EC 81 MG tablet Take 81 mg by mouth at bedtime.      carvedilol  (COREG ) 6.25 MG tablet Take 6.25 mg by mouth daily.     FARXIGA  10 MG TABS tablet TAKE 1 TABLET BY MOUTH DAILY  BEFORE BREAKFAST. 30 tablet 11   furosemide  (LASIX ) 20 MG tablet Take 1 tablet (20 mg total) by mouth daily. 30 tablet 5   ibuprofen  (ADVIL ) 200 MG tablet Take 600 mg by mouth as needed for moderate pain (pain score 4-6) or headache.     lisinopril  (ZESTRIL ) 40 MG tablet TAKE 1 TABLET BY MOUTH EVERY DAY 30 tablet 11   pantoprazole  (PROTONIX ) 40 MG tablet TAKE 1 TABLET BY MOUTH EVERY DAY 30 tablet 1   simvastatin  (ZOCOR ) 20 MG tablet TAKE 1 TABLET BY MOUTH EVERYDAY AT BEDTIME 30 tablet 5   spironolactone  (ALDACTONE ) 25 MG tablet TAKE 1 TABLET BY MOUTH EVERY DAY 90 tablet 3   warfarin (COUMADIN ) 10 MG tablet TAKE 1/2 TO 1 TABLET BY MOUTH DAILY AS DIRECTED BY COUMADIN  CLINIC 90 tablet 0   No current facility-administered medications for this encounter.    PHYSICAL EXAM: Vitals:   09/06/23 1120  BP: 108/70  Pulse: (!) 50  SpO2: 98%  Weight: 112.4 kg (247 lb 12.8 oz)    Wt Readings from Last 3 Encounters:  09/06/23 112.4 kg (247 lb 12.8 oz)  08/20/23 112.8 kg (248 lb 9.6 oz)  08/16/23 113.1 kg (249 lb 4 oz)    General:  Well appearing. No resp difficulty HEENT: normal Neck: supple. no JVD. Carotids 2+ bilat; no bruits. No lymphadenopathy or thryomegaly appreciated. Cor: PMI nondisplaced. Regular rate & rhythm. 3/6 AS mechanical s2 (crisp) Lungs: clear Abdomen: obese soft, nontender, nondistended. No hepatosplenomegaly. No bruits or masses. Good bowel sounds. Extremities: no cyanosis, clubbing, rash, trace edema Neuro: alert & orientedx3, cranial nerves grossly intact. moves all 4 extremities w/o difficulty. Affect pleasant  ReDS 55%   1. Aortic stenosis s/p mechanical AVR - Echo 03/02/17 EF normal Mean AoV gradient 26.  - Echo 6/19 AVR stable mean gradient 27 - Echo 2/21: EF 60-65% Aortic valve mean gradient measures 29.0 mmHg. Aortic valve peak gradient measures 57.8 mmHg. AVA 1.2 cm2  - Echo 10/22 EF 60% moderate AS mean gradient Personally reviewed - Echo 08/26/22 EF  50-55% moderate AS mean gradient Asc ao 5.0 Personally reviewed - Echo 07/05/23: EF 50-55% RV ok. AVR MG 27 mmHg, EOA 1.2 cm^2, DI 0.31 Aoroot 4.9 cm - AVR stable. Continue to follow with serial echos - Warfarin per Coumadin  Clinic - Aware of SBE prophylaxis  2. CP and SOB - symptoms not clearly anginal. AVR stable - suspect most likely due to HF/obesity - ReDS markedly elevated - Will double lasix  and reassess - If symptoms persist can consider R/L cath - CTA 4/23 - unable to see coronaries  due to streak artifact from mAVR  3. Acute on chronic diastolic HF - On Farxiga , lasix  and spiro  - As above. Appears volume overloaded ReDS 55%. Increase lasix . Limit fluid inake.    4. HTN - Blood pressure well controlled. Continue current regimen.   5. Aortic root aneurysm - Followed by Dr. Bartle - CT scan 2/20 which measures 5.0 x 4.9 cm, previously 4.8 x 4.6 cm measured similarly on examinations previously - CT chest 4/22:  - CT chest 5/24 AscAo 5.2cm new pulmonary nodule  - Echo 07/05/23: EF 50-55% RV ok. AVR MG 27 mmHg, EOA 1.2 cm^2, DI 0.31  AoRoot 4.9cm - stable. Continue to follow closely  6. Hyperlipidemia - Followed by Dr. Mahlon .  7. Paroxysmal Atrial fibrillation  - Followed by Dr. Cindie - Now in NSR on amio  - continue coumadin  - No change   8. OSA - unable to tolerate to CPAP - continue weight loss efforts  9. Obesity - attempting to get GLP1RA through PCP - Encouraged him to pursue this  - Has referral to [facility]  Toribio Fuel, MD 11:40 AM

## 2023-09-06 ENCOUNTER — Ambulatory Visit (INDEPENDENT_AMBULATORY_CARE_PROVIDER_SITE_OTHER): Payer: Commercial Managed Care - HMO

## 2023-09-06 ENCOUNTER — Ambulatory Visit (INDEPENDENT_AMBULATORY_CARE_PROVIDER_SITE_OTHER): Payer: Commercial Managed Care - HMO | Admitting: *Deleted

## 2023-09-06 ENCOUNTER — Encounter (HOSPITAL_COMMUNITY): Payer: Self-pay | Admitting: Internal Medicine

## 2023-09-06 ENCOUNTER — Ambulatory Visit
Admission: RE | Admit: 2023-09-06 | Discharge: 2023-09-06 | Disposition: A | Discharge status: Home / Self Care | Payer: Commercial Managed Care - HMO | Source: Ambulatory Visit | Attending: Internal Medicine | Admitting: Internal Medicine

## 2023-09-06 VITALS — BP 108/70 | HR 50 | Wt 247.8 lb

## 2023-09-06 DIAGNOSIS — I4891 Unspecified atrial fibrillation: Secondary | ICD-10-CM | POA: Diagnosis present

## 2023-09-06 DIAGNOSIS — R600 Localized edema: Secondary | ICD-10-CM | POA: Insufficient documentation

## 2023-09-06 DIAGNOSIS — Z7901 Long term (current) use of anticoagulants: Secondary | ICD-10-CM

## 2023-09-06 DIAGNOSIS — I77819 Aortic ectasia, unspecified site: Secondary | ICD-10-CM | POA: Insufficient documentation

## 2023-09-06 DIAGNOSIS — Z7984 Long term (current) use of oral hypoglycemic drugs: Secondary | ICD-10-CM | POA: Diagnosis not present

## 2023-09-06 DIAGNOSIS — I480 Paroxysmal atrial fibrillation: Secondary | ICD-10-CM | POA: Diagnosis not present

## 2023-09-06 DIAGNOSIS — R911 Solitary pulmonary nodule: Secondary | ICD-10-CM | POA: Insufficient documentation

## 2023-09-06 DIAGNOSIS — I5033 Acute on chronic diastolic (congestive) heart failure: Secondary | ICD-10-CM | POA: Diagnosis not present

## 2023-09-06 DIAGNOSIS — E669 Obesity, unspecified: Secondary | ICD-10-CM | POA: Insufficient documentation

## 2023-09-06 DIAGNOSIS — I4819 Other persistent atrial fibrillation: Secondary | ICD-10-CM | POA: Diagnosis not present

## 2023-09-06 DIAGNOSIS — I5032 Chronic diastolic (congestive) heart failure: Secondary | ICD-10-CM

## 2023-09-06 DIAGNOSIS — G4733 Obstructive sleep apnea (adult) (pediatric): Secondary | ICD-10-CM | POA: Diagnosis not present

## 2023-09-06 DIAGNOSIS — Z952 Presence of prosthetic heart valve: Secondary | ICD-10-CM | POA: Diagnosis not present

## 2023-09-06 DIAGNOSIS — E785 Hyperlipidemia, unspecified: Secondary | ICD-10-CM | POA: Insufficient documentation

## 2023-09-06 DIAGNOSIS — R0602 Shortness of breath: Secondary | ICD-10-CM | POA: Diagnosis not present

## 2023-09-06 DIAGNOSIS — Q2543 Congenital aneurysm of aorta: Secondary | ICD-10-CM

## 2023-09-06 DIAGNOSIS — I110 Hypertensive heart disease with heart failure: Secondary | ICD-10-CM | POA: Insufficient documentation

## 2023-09-06 DIAGNOSIS — Z79899 Other long term (current) drug therapy: Secondary | ICD-10-CM | POA: Insufficient documentation

## 2023-09-06 DIAGNOSIS — R079 Chest pain, unspecified: Secondary | ICD-10-CM | POA: Insufficient documentation

## 2023-09-06 DIAGNOSIS — I10 Essential (primary) hypertension: Secondary | ICD-10-CM

## 2023-09-06 DIAGNOSIS — R0789 Other chest pain: Secondary | ICD-10-CM

## 2023-09-06 LAB — CUP PACEART REMOTE DEVICE CHECK
Date Time Interrogation Session: 20250105230737
Implantable Pulse Generator Implant Date: 20221031

## 2023-09-06 LAB — POCT INR: INR: 4 — AB (ref 2.0–3.0)

## 2023-09-06 MED ORDER — FUROSEMIDE 40 MG PO TABS: 40.0000 mg | ORAL_TABLET | Freq: Every day | ORAL | 3 refills | Status: DC

## 2023-09-06 NOTE — Progress Notes (Signed)
 ReDS Vest / Clip - 09/06/23 1200       ReDS Vest / Clip   Station Marker D    Ruler Value 37    ReDS Value Range High volume overload    ReDS Actual Value 55

## 2023-09-06 NOTE — Patient Instructions (Signed)
 Great to see you today!!!  Increase Furosemide  to 40 mg Daily  You have had a BioBeat ambulatory BP monitor placed, please follow instructions and wear monitor for 24 hours, once complete you can dispose of monitor  Your physician recommends that you schedule a follow-up appointment in: 1 month  If you have any questions or concerns before your next appointment please send us  a message through [place] or call our office at [phone].    TO LEAVE A MESSAGE FOR THE NURSE SELECT OPTION 2, PLEASE LEAVE A MESSAGE INCLUDING: YOUR NAME DATE OF BIRTH CALL BACK NUMBER REASON FOR CALL**this is important as we prioritize the call backs  YOU WILL RECEIVE A CALL BACK THE SAME DAY AS LONG AS YOU CALL BEFORE 4:00 PM  Do the following things EVERYDAY: Weigh yourself in the morning before breakfast. Write it down and keep it in a log. Take your medicines as prescribed Eat low salt foods--Limit salt (sodium) to 2000 mg per day.  Stay as active as you can everyday Limit all fluids for the day to less than 2 liters

## 2023-09-06 NOTE — Patient Instructions (Signed)
 Description   Continue warfarin 1/2 tablet daily except 1 tablet on Sunday and Wednesday.  Recheck INR in 4 weeks.  Started Amio 200mg  BID x 1 month (05/18/2022) then QD.  Coumadin Clinic [phone] Cardiac clearance form Fax to [phone]

## 2023-09-07 NOTE — Progress Notes (Signed)
 LATE ENTRY from 09/06/23: BioBeat 24 hour amb BP monitor placed on patient during visit, education provided

## 2023-09-07 NOTE — Addendum Note (Signed)
 Encounter addended by: Noralee Space, RN on: 09/07/2023 5:02 PM  Actions taken: Visit diagnoses modified, Clinical Note Signed

## 2023-09-09 ENCOUNTER — Encounter: Payer: Self-pay | Admitting: Gastroenterology

## 2023-09-16 ENCOUNTER — Ambulatory Visit
Admission: RE | Admit: 2023-09-16 | Discharge: 2023-09-16 | Disposition: A | Discharge status: Home / Self Care | Payer: Commercial Managed Care - HMO | Source: Ambulatory Visit | Attending: Surgery | Admitting: Surgery

## 2023-09-16 DIAGNOSIS — I7121 Aneurysm of the ascending aorta, without rupture: Secondary | ICD-10-CM | POA: Diagnosis not present

## 2023-09-16 MED ORDER — IOPAMIDOL (ISOVUE-370) INJECTION 76%
75.0000 mL | Freq: Once | INTRAVENOUS | Status: AC | PRN
  Administered 2023-09-16: 75 mL via INTRAVENOUS

## 2023-09-17 ENCOUNTER — Other Ambulatory Visit: Payer: Commercial Managed Care - HMO

## 2023-09-22 ENCOUNTER — Other Ambulatory Visit: Payer: Commercial Managed Care - HMO

## 2023-09-22 ENCOUNTER — Encounter: Payer: Self-pay | Admitting: Family Medicine

## 2023-09-22 ENCOUNTER — Ambulatory Visit: Payer: Commercial Managed Care - HMO | Admitting: Surgery

## 2023-09-22 ENCOUNTER — Ambulatory Visit: Payer: Commercial Managed Care - HMO | Admitting: Family Medicine

## 2023-09-22 VITALS — BP 132/62 | HR 55 | Temp 97.6°F | Ht 70.0 in | Wt 245.4 lb

## 2023-09-22 DIAGNOSIS — J101 Influenza due to other identified influenza virus with other respiratory manifestations: Secondary | ICD-10-CM

## 2023-09-22 DIAGNOSIS — R051 Acute cough: Secondary | ICD-10-CM

## 2023-09-22 DIAGNOSIS — R52 Pain, unspecified: Secondary | ICD-10-CM | POA: Diagnosis not present

## 2023-09-22 LAB — POC INFLUENZA A&B (BINAX/QUICKVUE)
Influenza A, POC: POSITIVE — AB
Influenza B, POC: NEGATIVE

## 2023-09-22 LAB — POC COVID19 BINAXNOW: SARS Coronavirus 2 Ag: NEGATIVE

## 2023-09-22 MED ORDER — ALBUTEROL SULFATE HFA 108 (90 BASE) MCG/ACT IN AERS: 2.0000 | INHALATION_SPRAY | Freq: Four times a day (QID) | RESPIRATORY_TRACT | 1 refills | Status: DC | PRN

## 2023-09-22 MED ORDER — PROMETHAZINE-DM 6.25-15 MG/5ML PO SYRP: 5.0000 mL | ORAL_SOLUTION | Freq: Four times a day (QID) | ORAL | 0 refills | Status: DC | PRN

## 2023-09-22 MED ORDER — OSELTAMIVIR PHOSPHATE 75 MG PO CAPS
75.0000 mg | ORAL_CAPSULE | Freq: Two times a day (BID) | ORAL | 0 refills | Status: DC
Start: 2023-09-22 — End: 2023-10-06

## 2023-09-22 NOTE — Patient Instructions (Signed)
Follow up as needed or as scheduled START the Tamiflu as directed USE the Albuterol inhaler as needed TAKE the cough syrup as needed Drink LOTS of fluids REST! Call with any questions or concerns Hang in there!

## 2023-09-22 NOTE — Progress Notes (Signed)
   Subjective:    Patient ID: Mark [name], male    DOB: [birth year], 63 y.o.   MRN: [id number]  HPI Flu like illness- sxs started Monday w/ nausea.  + body aches, chlls, fever, fatigue.  Denies HA.  + cough- dry.  No known sick contacts but now wife is also sick.     Review of Systems For ROS see HPI     Objective:   Physical Exam Vitals reviewed.  Constitutional:      General: He is not in acute distress.    Appearance: He is well-developed.     Comments: Obviously not feeling well  HENT:     Head: Normocephalic and atraumatic.     Right Ear: Tympanic membrane normal.     Left Ear: Tympanic membrane normal.     Nose:     Right Sinus: No maxillary sinus tenderness or frontal sinus tenderness.     Left Sinus: No maxillary sinus tenderness or frontal sinus tenderness.     Mouth/Throat:     Pharynx: No oropharyngeal exudate.  Eyes:     General: No scleral icterus.    Extraocular Movements: Extraocular movements intact.  Cardiovascular:     Rate and Rhythm: Normal rate and regular rhythm.     Heart sounds: Normal heart sounds.  Pulmonary:     Effort: Pulmonary effort is normal. No respiratory distress.     Breath sounds: Wheezing (faint inspiratory wheezes- cleared w/ cough) present. No rales.     Comments: Harsh, dry cough Musculoskeletal:     Cervical back: Normal range of motion and neck supple.  Lymphadenopathy:     Cervical: No cervical adenopathy.  Skin:    General: Skin is warm.     Findings: No rash.  Neurological:     Mental Status: He is alert and oriented to person, place, and time.           Assessment & Plan:  Influenza A- new.  Pt's sxs are consistent w/ dx and rapid test confirms.  Will start Tamiflu, cough syrup, and Albuterol prn.  Reviewed supportive care and red flags that should prompt return.  Pt expressed understanding and is in agreement w/ plan.

## 2023-10-01 ENCOUNTER — Other Ambulatory Visit: Payer: Self-pay | Admitting: Family Medicine

## 2023-10-01 ENCOUNTER — Encounter: Payer: Self-pay | Admitting: Family Medicine

## 2023-10-01 MED ORDER — PREDNISONE 10 MG PO TABS: ORAL_TABLET | ORAL | 0 refills | Status: DC

## 2023-10-01 NOTE — Addendum Note (Signed)
Addended by: Sheliah Hatch on: 10/01/2023 03:50 PM   Modules accepted: Orders

## 2023-10-05 ENCOUNTER — Ambulatory Visit: Payer: Commercial Managed Care - HMO | Admitting: Surgery

## 2023-10-06 ENCOUNTER — Encounter: Payer: Self-pay | Admitting: Surgery

## 2023-10-06 ENCOUNTER — Ambulatory Visit: Payer: Commercial Managed Care - HMO | Admitting: Surgery

## 2023-10-06 VITALS — BP 119/73 | HR 52 | Resp 18 | Ht 70.0 in | Wt 245.0 lb

## 2023-10-06 DIAGNOSIS — I7121 Aneurysm of the ascending aorta, without rupture: Secondary | ICD-10-CM

## 2023-10-06 NOTE — Progress Notes (Signed)
 HPI:  The patient is a 63 year old gentleman  with a history of hypertension, atrial fibrillation status post 3 ablation procedures, and mechanical aortic valve replacement in 1983 for bicuspid aortic valve disease in [place]  who was followed by Dr. Dusty for an ascending aortic aneurysm.  I saw him on 01/14/2022 and CTA of the chest at that time showed the maximum diameter of the aortic aneurysm was 5.2 cm which was minimally changed compared to the prior year when it was measured at 5.1 cm.  I last saw him on 01/20/2023 and the ascending aortic aneurysm was stable at 5.1 cm.  There was also a new 1.5 x 1.0 cm groundglass nodule in the superior segment of the right lower lobe that was felt to be likely inflammatory or infectious in etiology.  He reports having the flu about 3 weeks ago and still has a bad cough and some shortness of breath.  He was treated with Tamiflu , prednisone , cough medicine, and Ventolin  HFA.  Current Outpatient Medications  Medication Sig Dispense Refill   albuterol  (VENTOLIN  HFA) 108 (90 Base) MCG/ACT inhaler Inhale 2 puffs into the lungs every 6 (six) hours as needed for wheezing or shortness of breath. 8 g 1   amiodarone  (PACERONE ) 200 MG tablet TAKE 1 TABLET BY MOUTH EVERY DAY 30 tablet 5   aspirin  EC 81 MG tablet Take 81 mg by mouth at bedtime.      carvedilol  (COREG ) 6.25 MG tablet Take 6.25 mg by mouth daily.     FARXIGA  10 MG TABS tablet TAKE 1 TABLET BY MOUTH DAILY BEFORE BREAKFAST. 30 tablet 11   furosemide  (LASIX ) 40 MG tablet Take 1 tablet (40 mg total) by mouth daily. 30 tablet 3   ibuprofen  (ADVIL ) 200 MG tablet Take 600 mg by mouth as needed for moderate pain (pain score 4-6) or headache.     lisinopril  (ZESTRIL ) 40 MG tablet TAKE 1 TABLET BY MOUTH EVERY DAY 30 tablet 11   pantoprazole  (PROTONIX ) 40 MG tablet TAKE 1 TABLET BY MOUTH EVERY DAY 30 tablet 1   promethazine -dextromethorphan (PROMETHAZINE -DM) 6.25-15 MG/5ML syrup Take 5 mLs by mouth 4  (four) times daily as needed. 240 mL 0   simvastatin  (ZOCOR ) 20 MG tablet TAKE 1 TABLET BY MOUTH EVERYDAY AT BEDTIME 30 tablet 5   spironolactone  (ALDACTONE ) 25 MG tablet TAKE 1 TABLET BY MOUTH EVERY DAY 90 tablet 3   warfarin (COUMADIN ) 10 MG tablet TAKE 1/2 TO 1 TABLET BY MOUTH DAILY AS DIRECTED BY COUMADIN  CLINIC 90 tablet 0   No current facility-administered medications for this visit.     Physical Exam: BP 119/73   Pulse (!) 52   Resp 18   Ht 5' 10 (1.778 m)   Wt 245 lb (111.1 kg)   SpO2 94% Comment: RA  BMI 35.15 kg/m  He looks well. Cardiac exam shows a regular rate and rhythm with a crisp mechanical valve click.  There is a soft systolic murmur. Lungs are clear. There is no peripheral edema.  Diagnostic Tests:  Narrative & Impression  CLINICAL DATA:  Follow-up ascending aortic aneurysm.   EXAM: CT ANGIOGRAPHY CHEST WITH CONTRAST   TECHNIQUE: Multidetector CT imaging of the chest was performed using the standard protocol during bolus administration of intravenous contrast. Multiplanar CT image reconstructions and MIPs were obtained to evaluate the vascular anatomy.   RADIATION DOSE REDUCTION: This exam was performed according to the departmental dose-optimization program which includes automated exposure control, adjustment of the mA  and/or kV according to patient size and/or use of iterative reconstruction technique.   CONTRAST:  75mL ISOVUE -370 IOPAMIDOL  (ISOVUE -370) INJECTION 76%   COMPARISON:  Chest CT dated 01/20/2023.   FINDINGS: Cardiovascular: There is no cardiomegaly or pericardial effusion. There is mild atherosclerotic calcification of the thoracic aorta. Dilatation of the ascending aorta measure up to 4.8 cm in maximal diameter. No aortic dissection or periaortic fluid collection. The origins of the great vessels of the aortic arch appear patent. No pulmonary artery embolus identified.   Mediastinum/Nodes: No hilar or mediastinal adenopathy.  Small hiatal hernia. The esophagus and the thyroid  gland are grossly unremarkable. No mediastinal fluid collection.   Lungs/Pleura: No focal consolidation, pleural effusion, or pneumothorax. Interval resolution of the previously seen ground-glass focus in the superior segment of the right lower lobe. The central airways are patent.   Upper Abdomen: Cholecystectomy.   Musculoskeletal: Osteopenia. Old compression fracture of superior endplate of L1. Median sternotomy wires. No acute osseous pathology. Retained metallic pellets in the right lower chest.   Review of the MIP images confirms the above findings.   IMPRESSION: 1. Dilatation of the ascending aorta measure up to 4.8 cm in maximal diameter. Ascending thoracic aortic aneurysm. Recommend semi-annual imaging followup by CTA or MRA and referral to cardiothoracic surgery if not already obtained. This recommendation follows 2010 ACCF/AHA/AATS/ACR/ASA/SCA/SCAI/SIR/STS/SVM Guidelines for the Diagnosis and Management of Patients With Thoracic Aortic Disease. Circulation. 2010; 121: Z733-z630. Aortic aneurysm NOS (ICD10-I71.9) 2. No acute intrathoracic pathology. 3.  Aortic Atherosclerosis (ICD10-I70.0).     Electronically Signed   By: Vanetta Chou M.D.   On: 09/16/2023 12:10      Impression:  His current CTA shows the ascending aorta to measure 4.8 cm which is slightly smaller than the measurement of 5.1 cm in May 2024.  I have reviewed his last 3 CTA studies and I do not think there is any significant difference.  His aneurysm is still below the surgical threshold and given his prior surgery I would continue to follow this with yearly CTA.  The previously seen groundglass nodule has resolved.  There is no evidence of pulmonary infiltrate following his recent bout of flu.  I reviewed the CT images with him and answered all of his questions.  I stressed the importance of continued good blood pressure control in preventing further  enlargement and acute aortic dissection.  I advised him against doing any heavy lifting that may require a Valsalva maneuver and could suddenly raise his blood pressure to high levels.   Plan:  I will see him back in 1 year with a CTA of the chest.  I spent 20 minutes performing this established patient evaluation and > 50% of this time was spent face to face counseling and coordinating the care of this patient's aortic aneurysm.    Dorise MARLA Fellers, MD [facility] [phone]

## 2023-10-07 ENCOUNTER — Telehealth (HOSPITAL_COMMUNITY): Payer: Self-pay | Admitting: Internal Medicine

## 2023-10-07 NOTE — Telephone Encounter (Signed)
 Called patient at [phone] to remind patient of his appointment with Dr. Julane Ny on Friday, 10/08/23 at 10:20 AM.   Unable to speak to patient / unable to reach patient - - front office left a voice mail message on this patients telephone.

## 2023-10-08 ENCOUNTER — Encounter (HOSPITAL_COMMUNITY): Payer: Self-pay | Admitting: Internal Medicine

## 2023-10-08 ENCOUNTER — Ambulatory Visit (INDEPENDENT_AMBULATORY_CARE_PROVIDER_SITE_OTHER): Payer: Commercial Managed Care - HMO

## 2023-10-08 ENCOUNTER — Ambulatory Visit (HOSPITAL_COMMUNITY)
Admission: RE | Admit: 2023-10-08 | Discharge: 2023-10-08 | Disposition: A | Discharge status: Home / Self Care | Payer: Commercial Managed Care - HMO | Source: Ambulatory Visit | Attending: Internal Medicine | Admitting: Internal Medicine

## 2023-10-08 ENCOUNTER — Ambulatory Visit
Admission: RE | Admit: 2023-10-08 | Discharge: 2023-10-08 | Disposition: A | Discharge status: Home / Self Care | Payer: Commercial Managed Care - HMO | Source: Ambulatory Visit | Attending: Internal Medicine | Admitting: Internal Medicine

## 2023-10-08 VITALS — BP 110/68 | HR 47 | Wt 251.6 lb

## 2023-10-08 DIAGNOSIS — Q2543 Congenital aneurysm of aorta: Secondary | ICD-10-CM | POA: Insufficient documentation

## 2023-10-08 DIAGNOSIS — I10 Essential (primary) hypertension: Secondary | ICD-10-CM | POA: Diagnosis not present

## 2023-10-08 DIAGNOSIS — I110 Hypertensive heart disease with heart failure: Secondary | ICD-10-CM | POA: Insufficient documentation

## 2023-10-08 DIAGNOSIS — R079 Chest pain, unspecified: Secondary | ICD-10-CM | POA: Diagnosis present

## 2023-10-08 DIAGNOSIS — R0602 Shortness of breath: Secondary | ICD-10-CM | POA: Diagnosis present

## 2023-10-08 DIAGNOSIS — G4733 Obstructive sleep apnea (adult) (pediatric): Secondary | ICD-10-CM | POA: Diagnosis not present

## 2023-10-08 DIAGNOSIS — Z7901 Long term (current) use of anticoagulants: Secondary | ICD-10-CM | POA: Insufficient documentation

## 2023-10-08 DIAGNOSIS — E669 Obesity, unspecified: Secondary | ICD-10-CM | POA: Diagnosis not present

## 2023-10-08 DIAGNOSIS — Z952 Presence of prosthetic heart valve: Secondary | ICD-10-CM

## 2023-10-08 DIAGNOSIS — R9431 Abnormal electrocardiogram [ECG] [EKG]: Secondary | ICD-10-CM | POA: Insufficient documentation

## 2023-10-08 DIAGNOSIS — I4891 Unspecified atrial fibrillation: Secondary | ICD-10-CM

## 2023-10-08 DIAGNOSIS — R051 Acute cough: Secondary | ICD-10-CM

## 2023-10-08 DIAGNOSIS — I5032 Chronic diastolic (congestive) heart failure: Secondary | ICD-10-CM | POA: Diagnosis not present

## 2023-10-08 DIAGNOSIS — I5033 Acute on chronic diastolic (congestive) heart failure: Secondary | ICD-10-CM | POA: Diagnosis not present

## 2023-10-08 DIAGNOSIS — I480 Paroxysmal atrial fibrillation: Secondary | ICD-10-CM | POA: Insufficient documentation

## 2023-10-08 DIAGNOSIS — R0789 Other chest pain: Secondary | ICD-10-CM

## 2023-10-08 DIAGNOSIS — E785 Hyperlipidemia, unspecified: Secondary | ICD-10-CM | POA: Diagnosis not present

## 2023-10-08 LAB — PROTIME-INR
INR: 6.6 (ref 0.9–1.2)
Prothrombin Time: 65 s — ABNORMAL HIGH (ref 9.1–12.0)

## 2023-10-08 NOTE — Patient Instructions (Signed)
 Great to see you today!!!  A chest x-ray takes a picture of the organs and structures inside the chest, including the heart, lungs, and blood vessels. This test can show several things, including, whether the heart is enlarges; whether fluid is building up in the lungs; and whether pacemaker / defibrillator leads are still in place.  Your physician recommends that you schedule a follow-up appointment in: 2 months  If you have any questions or concerns before your next appointment please send us  a message through [place] or call our office at [phone].    TO LEAVE A MESSAGE FOR THE NURSE SELECT OPTION 2, PLEASE LEAVE A MESSAGE INCLUDING: YOUR NAME DATE OF BIRTH CALL BACK NUMBER REASON FOR CALL**this is important as we prioritize the call backs  YOU WILL RECEIVE A CALL BACK THE SAME DAY AS LONG AS YOU CALL BEFORE 4:00 PM  At the [facility], you and your health needs are our priority. As part of our continuing mission to provide you with exceptional heart care, we have created designated Provider Care Teams. These Care Teams include your primary Cardiologist (physician) and Advanced Practice Providers (APPs- Physician Assistants and Nurse Practitioners) who all work together to provide you with the care you need, when you need it.   You may see any of the following providers on your designated Care Team at your next follow up: Dr Toribio Fuel Dr Ezra Shuck Dr. Ria Commander Dr. Morene Brownie Amy Lenetta, NP Caffie Shed, [place] [facility] [place], [place] Beckey Coe, NP Jordan Lee, NP Tinnie Redman, PharmD   Please be sure to bring in all your medications bottles to every appointment.    Thank you for choosing [facility]

## 2023-10-08 NOTE — Patient Instructions (Signed)
 Description   STAT INR is 6.6. Called and instructed pt to eat greens and HOLD today's dose and tomorrow's dose.Then resume taking 1/2 tablet daily except 1 tablet on Sunday and Wednesday.  Recheck INR in 1 week Amio 200mg  daily.   Coumadin  Clinic [phone] Cardiac clearance form Fax to [phone]

## 2023-10-08 NOTE — Progress Notes (Signed)
 ReDS Vest / Clip - 10/08/23 1000       ReDS Vest / Clip   Station Marker D    Ruler Value 33.5    ReDS Value Range Moderate volume overload    ReDS Actual Value 39

## 2023-10-08 NOTE — Progress Notes (Addendum)
Patient ID: Mark [name], male   DOB: 12-Mar-1961, 63 y.o.   MRN: [id number]    Advanced Heart Failure Clinic Note    PCP: [name] HF: Dr. Gala Romney   HPI:  [name] is a 63 year old male with a history of bicuspid aortic valve complicated by endocarditis.  He is status post St. Jude mechanical aortic valve replacement in 1983.  He also has a history of hyperlipidemia and PAF s/p DC-CV in 9/11. OSA noncompliant with CPAP.   Over the past few years year, he has had some progression in the gradients across his valve.  He underwent cardiac catheterization in May 2009 which showed normal coronary arteries.The valve leaflets were seen to be opening well on fluoroscopy.  Had TEE.  While there was some turbulence around the valve, the leaflets seemed to be moving well.  There was no obvious pannus formation.  Gradient on his transthoracic echo was within the moderate range at 33. 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 He was seen by Dr. [name] who agreed  with continue watchul waiting.   Hasalso struggled with AF/palpitations.  Has seen Dr. Johney Frame and had two ablations in 8/13 and 3/14. Now has Linq monitor in (placed 04/06/17). Seen in AF Clinic in 7/21 AF burden was low. Most recent device interrogation from 12/21 reviewed AF burden 13.9% with longest event 49 hr duration (overall AF burden over life of ILR ~5%).  CT chest 4/22: Stable dilation of ascending aorta 4.8 cm CT chest 5/24 AscAo 5.2cm new pulmonary nodule - Has seen Dr. Laneta Simmers. Continue f/u for now  Echo 10/22 EF 60% moderate AS mean gradient Personally reviewed Echo 08/26/22 EF 50-55% moderate AS mean gradient Asc ao 5.0 Personally reviewed  Underwent excision of large blood-filled pre-patellar bursa on his leg on 05/10/23.   Echo 07/05/23: EF 50-55% RV ok. AVR MG 27 mmHg, EOA 1.2 cm^2, DI 0.31   Here for 1 month f/u. At last visit ReDs was 55% and lasix increased to 40 daily. Had the flu 3 weeks  ago and still with bad cough. Still with fairly regular chest pain can have several episodes per day Mostly just last a few mins and resolve Can happen at any time. Swelling feels improved but.remains SOB. Still with severe cough from flu. Trying to be more active now starting walking 30 mins/day. Recently saw Dr. Laneta Simmers. I reviewed note -> stable. F/u 1 year with him   ReDs today 39%   ABPM looks good 109/74 (Personally reviewed)   Cardiac studies:  Echo 2/21: EF 60-65% Aortic valve mean gradient measures 29.0 mmHg. Aortic valve peak gradient  measures 57.8 mmHg. AVA 1.2 cm2 Personally reviewed  Echo 6/19: EF 55-60% AoV Mean gradient (S): 27 mm Hg  Echo 7/18  EF 55-60% AoV Mean gradient (S): 26 mm Hg. Peak gradient (S): 51 mm Hg. AVA 1.35 cm2 Echo 12/14/2014 LVEF 55-60%, Aortic valve gradient 44 to 60 mm Hg, increased from 22 to 35 mm Hg. Echo 4/17 EF 60-65%  AoV Mean gradient (S): 34 mm Hg. Peak gradient (S): 63 mm Hg.  Review of systems complete and found to be negative unless listed in HPI.   Past Medical History:  Diagnosis Date   ADHD (attention deficit hyperactivity disorder)    Aortic stenosis    a. due to bicuspid AoV; 1983 S/p mechanical AVR (St. Jude) - chronic coumadin with INR run between 3.5-4.5;  b. 03/2012 Echo: EF 60%, nl wall  motion, mod dil LA.   Arthritis    Ascending aortic aneurysm (HCC)    Atrial tachycardia (HCC)    a. admitted 07/2012   CAD (coronary artery disease)    CHF (congestive heart failure) (HCC)    COVID janurary 2022   Dysrhythmia    ED (erectile dysfunction)    Gallstones    Headache(784.0)    Hepatitis A    a. as a child (from Seafood)   History of kidney stones    Hypertension    OSA (obstructive sleep apnea)    a. noncompliant with CPAP   Paroxysmal atrial fibrillation (HCC)    s/p afib ablation x 2   Pneumonia     Current Outpatient Medications  Medication Sig Dispense Refill   albuterol (VENTOLIN HFA) 108 (90 Base) MCG/ACT  inhaler Inhale 2 puffs into the lungs every 6 (six) hours as needed for wheezing or shortness of breath. 8 g 1   amiodarone (PACERONE) 200 MG tablet TAKE 1 TABLET BY MOUTH EVERY DAY 30 tablet 5   aspirin EC 81 MG tablet Take 81 mg by mouth at bedtime.      carvedilol (COREG) 6.25 MG tablet Take 6.25 mg by mouth daily.     FARXIGA 10 MG TABS tablet TAKE 1 TABLET BY MOUTH DAILY BEFORE BREAKFAST. 30 tablet 11   furosemide (LASIX) 40 MG tablet Take 1 tablet (40 mg total) by mouth daily. 30 tablet 3   ibuprofen (ADVIL) 200 MG tablet Take 600 mg by mouth as needed for moderate pain (pain score 4-6) or headache.     lisinopril (ZESTRIL) 40 MG tablet TAKE 1 TABLET BY MOUTH EVERY DAY 30 tablet 11   pantoprazole (PROTONIX) 40 MG tablet TAKE 1 TABLET BY MOUTH EVERY DAY 30 tablet 1   promethazine-dextromethorphan (PROMETHAZINE-DM) 6.25-15 MG/5ML syrup Take 5 mLs by mouth 4 (four) times daily as needed. 240 mL 0   simvastatin (ZOCOR) 20 MG tablet TAKE 1 TABLET BY MOUTH EVERYDAY AT BEDTIME 30 tablet 5   spironolactone (ALDACTONE) 25 MG tablet TAKE 1 TABLET BY MOUTH EVERY DAY 90 tablet 3   warfarin (COUMADIN) 10 MG tablet TAKE 1/2 TO 1 TABLET BY MOUTH DAILY AS DIRECTED BY COUMADIN CLINIC 90 tablet 0   No current facility-administered medications for this encounter.    PHYSICAL EXAM: Vitals:   10/08/23 1025  BP: 110/68  Pulse: (!) 47  SpO2: 96%  Weight: 114.1 kg (251 lb 9.6 oz)    Wt Readings from Last 3 Encounters:  10/08/23 114.1 kg (251 lb 9.6 oz)  10/06/23 111.1 kg (245 lb)  09/22/23 111.3 kg (245 lb 6 oz)    General:  Hacking cough No resp difficulty HEENT: normal Neck: supple. no JVD. Carotids 2+ bilat; no bruits. No lymphadenopathy or thryomegaly appreciated. Cor: PMI nondisplaced. Regular rate & rhythm. Mechanical s2 2/6 AS Lungs: + mild wheeze  Abdomen: obese soft, nontender, nondistended. No hepatosplenomegaly. No bruits or masses. Good bowel sounds. Extremities: no cyanosis, clubbing,  rash, edema Neuro: alert & orientedx3, cranial nerves grossly intact. moves all 4 extremities w/o difficulty. Affect pleasant   Assessment & Plan:  1. Aortic stenosis s/p mechanical AVR - Echo 03/02/17 EF normal Mean AoV gradient 26.  - Echo 6/19 AVR stable mean gradient 27 - Echo 2/21: EF 60-65% Aortic valve mean gradient measures 29.0 mmHg. Aortic valve peak gradient measures 57.8 mmHg. AVA 1.2 cm2  - Echo 10/22 EF 60% moderate AS mean gradient Personally reviewed - Echo 08/26/22  EF 50-55% moderate AS mean gradient Asc ao 5.0 Personally reviewed - Echo 07/05/23: EF 50-55% RV ok. AVR MG 27 mmHg, EOA 1.2 cm^2, DI 0.31 Aoroot 4.9 cm - AVR stable. Continue to follow with serial echos. Has seen Dr. Laneta Simmers recently who felt AVR and Ascending Ao diat - Warfarin per coumadin clinic. No recent bleeding - Aware of SBE prophylaxis  2. CP and SOB - symptoms not clearly anginal. AVR stable - suspect most likely due to volume - Improving a bit but confounded by recent flu. Still coughing and wheezing. Will get CXR - ReDS improved with increased lasix  - Continue current lasix dosing - If symptoms persist can consider R/L cath in future but we we will defer for now - CTA 4/23 - unable to see coronaries due to streak artifact from mAVR  3. Acute on chronic diastolic HF - On Farxiga, lasix and spiro  - As above lasix recently increased. ReDS improved. Continue current dosing   4. HTN - Blood pressure well controlled. Continue current regimen. - ABPM looks good.  5. Aortic root aneurysm - Followed by Dr. Laneta Simmers - CT scan 2/20 which measures 5.0 x 4.9 cm, previously 4.8 x 4.6 cm measured similarly on examinations previously - CT chest 4/22:  - CT chest 5/24 AscAo 5.2cm new pulmonary nodule  - Echo 07/05/23: EF 50-55% RV ok. AVR MG 27 mmHg, EOA 1.2 cm^2, DI 0.31  AoRoot 4.9cm - stable. Continue to follow closely  6. Hyperlipidemia - Followed by Dr. Beverely Low .  7. Paroxysmal Atrial  fibrillation  - Followed by Dr. Lalla Brothers - Now in NSR on amio  - continue coumadin - No change   8. OSA - unable to tolerate to CPAP - continue weight loss efforts  9. Obesity - attempting to get GLP1RA through PCP - Encouraged him to pursue this  - Continue walking program   Arvilla Meres, MD 11:10 AM

## 2023-10-11 ENCOUNTER — Ambulatory Visit: Payer: Commercial Managed Care - HMO | Attending: Cardiology

## 2023-10-11 DIAGNOSIS — I4891 Unspecified atrial fibrillation: Secondary | ICD-10-CM

## 2023-10-11 LAB — CUP PACEART REMOTE DEVICE CHECK
Date Time Interrogation Session: 20250209231041
Implantable Pulse Generator Implant Date: 20221031

## 2023-10-12 ENCOUNTER — Encounter (HOSPITAL_COMMUNITY): Payer: Self-pay | Admitting: Internal Medicine

## 2023-10-13 NOTE — Progress Notes (Signed)
Carelink Summary Report / Loop Recorder

## 2023-10-14 ENCOUNTER — Encounter: Payer: Self-pay | Admitting: Cardiology

## 2023-10-15 ENCOUNTER — Ambulatory Visit: Payer: Commercial Managed Care - HMO | Attending: Cardiology | Admitting: *Deleted

## 2023-10-15 DIAGNOSIS — I4891 Unspecified atrial fibrillation: Secondary | ICD-10-CM | POA: Diagnosis not present

## 2023-10-15 DIAGNOSIS — Z952 Presence of prosthetic heart valve: Secondary | ICD-10-CM | POA: Diagnosis not present

## 2023-10-15 DIAGNOSIS — Z7901 Long term (current) use of anticoagulants: Secondary | ICD-10-CM | POA: Diagnosis not present

## 2023-10-15 DIAGNOSIS — I4819 Other persistent atrial fibrillation: Secondary | ICD-10-CM | POA: Diagnosis not present

## 2023-10-15 LAB — POCT INR: INR: 4.3 — AB (ref 2.0–3.0)

## 2023-10-15 NOTE — Patient Instructions (Addendum)
Description   Continue taking warfarin 1/2 tablet daily except 1 tablet on Sunday and Wednesday.  Recheck INR in 3 weeks.  Amio 200mg  daily.   Coumadin Clinic [phone] Cardiac clearance form Fax to [phone]

## 2023-11-01 ENCOUNTER — Encounter (HOSPITAL_COMMUNITY): Payer: Self-pay

## 2023-11-01 ENCOUNTER — Other Ambulatory Visit: Payer: Self-pay

## 2023-11-01 ENCOUNTER — Ambulatory Visit: Payer: Self-pay | Admitting: Family Medicine

## 2023-11-01 ENCOUNTER — Emergency Department (HOSPITAL_COMMUNITY)
Admission: EM | Admit: 2023-11-01 | Discharge: 2023-11-01 | Disposition: A | Discharge status: Home / Self Care | Payer: Self-pay | Attending: Emergency Medicine | Admitting: Emergency Medicine

## 2023-11-01 DIAGNOSIS — N179 Acute kidney failure, unspecified: Secondary | ICD-10-CM | POA: Diagnosis not present

## 2023-11-01 DIAGNOSIS — E861 Hypovolemia: Secondary | ICD-10-CM | POA: Diagnosis not present

## 2023-11-01 DIAGNOSIS — I110 Hypertensive heart disease with heart failure: Secondary | ICD-10-CM | POA: Insufficient documentation

## 2023-11-01 DIAGNOSIS — Z8616 Personal history of COVID-19: Secondary | ICD-10-CM | POA: Diagnosis not present

## 2023-11-01 DIAGNOSIS — I2510 Atherosclerotic heart disease of native coronary artery without angina pectoris: Secondary | ICD-10-CM | POA: Diagnosis not present

## 2023-11-01 DIAGNOSIS — I959 Hypotension, unspecified: Secondary | ICD-10-CM | POA: Insufficient documentation

## 2023-11-01 DIAGNOSIS — I509 Heart failure, unspecified: Secondary | ICD-10-CM | POA: Insufficient documentation

## 2023-11-01 DIAGNOSIS — R42 Dizziness and giddiness: Secondary | ICD-10-CM | POA: Diagnosis present

## 2023-11-01 LAB — BASIC METABOLIC PANEL
Anion gap: 12 (ref 5–15)
BUN: 47 mg/dL — ABNORMAL HIGH (ref 6–20)
CO2: 22 mmol/L (ref 22–32)
Calcium: 9.5 mg/dL (ref 8.9–10.3)
Chloride: 101 mmol/L (ref 98–111)
Creatinine, Ser: 2.67 mg/dL — ABNORMAL HIGH (ref 0.61–1.24)
GFR, Estimated: 27 mL/min — ABNORMAL LOW (ref >60)
Glucose, Bld: 86 mg/dL (ref 70–99)
Potassium: 5.3 mmol/L — ABNORMAL HIGH (ref 3.5–5.1)
Sodium: 135 mmol/L (ref 135–145)

## 2023-11-01 LAB — URINALYSIS, ROUTINE W REFLEX MICROSCOPIC
Bilirubin Urine: NEGATIVE
Glucose, UA: 150 mg/dL — AB
Hgb urine dipstick: NEGATIVE
Ketones, ur: NEGATIVE mg/dL
Leukocytes,Ua: NEGATIVE
Nitrite: NEGATIVE
Protein, ur: NEGATIVE mg/dL
Specific Gravity, Urine: 1.011 (ref 1.005–1.030)
pH: 5 (ref 5.0–8.0)

## 2023-11-01 LAB — CBC WITH DIFFERENTIAL/PLATELET
Abs Immature Granulocytes: 0.13 10*3/uL — ABNORMAL HIGH (ref 0.00–0.07)
Basophils Absolute: 0.1 10*3/uL (ref 0.0–0.1)
Basophils Relative: 1 %
Eosinophils Absolute: 0.1 10*3/uL (ref 0.0–0.5)
Eosinophils Relative: 2 %
HCT: 40.9 % (ref 39.0–52.0)
Hemoglobin: 13.5 g/dL (ref 13.0–17.0)
Immature Granulocytes: 2 %
Lymphocytes Relative: 22 %
Lymphs Abs: 1.9 10*3/uL (ref 0.7–4.0)
MCH: 30.8 pg (ref 26.0–34.0)
MCHC: 33 g/dL (ref 30.0–36.0)
MCV: 93.2 fL (ref 80.0–100.0)
Monocytes Absolute: 0.8 10*3/uL (ref 0.1–1.0)
Monocytes Relative: 9 %
Neutro Abs: 5.8 10*3/uL (ref 1.7–7.7)
Neutrophils Relative %: 64 %
Platelets: 228 10*3/uL (ref 150–400)
RBC: 4.39 MIL/uL (ref 4.22–5.81)
RDW: 13 % (ref 11.5–15.5)
WBC: 8.9 10*3/uL (ref 4.0–10.5)
nRBC: 0 % (ref 0.0–0.2)

## 2023-11-01 LAB — PROTIME-INR
INR: 3.5 — ABNORMAL HIGH (ref 0.8–1.2)
Prothrombin Time: 35.5 s — ABNORMAL HIGH (ref 11.4–15.2)

## 2023-11-01 LAB — CBG MONITORING, ED: Glucose-Capillary: 79 mg/dL (ref 70–99)

## 2023-11-01 MED ORDER — LACTATED RINGERS IV BOLUS
1000.0000 mL | Freq: Once | INTRAVENOUS | Status: AC
  Administered 2023-11-01: 1000 mL via INTRAVENOUS

## 2023-11-01 NOTE — ED Triage Notes (Addendum)
 Pt to ED c/o hypotension, reports took bp at home aprox 20 mins prior to arrival and noticed bp to be low, 88/47, reports at this time felt dizzy, reports symptoms have since resolved. Reports hx HTN, compliant with medication, reports takes at night, no recent changes in medications. Denies CP, Denies [facility]
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
## 2023-11-01 NOTE — Discharge Instructions (Addendum)
 Mark [name]:  Thank you for allowing [place] to take care of you today.  We hope you begin feeling better soon.  To-Do: Please stop taking your Lasix until you can follow up with your cardiologist or your kidney doctor. We recommend following up in the next week to discuss changes to your Lasix. Please return to the Emergency Department or call 911 if you experience chest pain, shortness of breath, severe pain, severe fever, altered mental status, or have any reason to think that you need emergency medical care.  Thank you again.  Hope you feel better soon.  Department of Emergency Medicine [facility]

## 2023-11-01 NOTE — Telephone Encounter (Signed)
 Chief Complaint: Low Blood Pressure Symptoms: Dizziness, stumbling, headache (a couple months) Frequency: Low BP Started yesterday and today Pertinent Negatives: Patient denies being recently sick Disposition: [x] ED /[] Urgent Care (no appt availability in office) / [] Appointment(In office/virtual)/ []  [place] [facility]/ [] Home Care/ [] Refused Recommended Disposition /[] [place] [facility]/ []  Follow-up with PCP Additional Notes: Spoke with pt wife, Mark [name]. Pt BP a few minutes ago was 88/47 and now is 102/54. Pt HR 55. Pt is dizzy, has been stumbling, and having headaches. This RN advised pt wife to take pt to ED now. Pt wife verbalized understanding and agrees to plan.   Copied from CRM [id number]. Topic: Clinical - Red Word Triage >> Nov 01, 2023 11:52 AM Mark [name] wrote: Red Word that prompted transfer to Nurse Triage: Patient's spouse states he has had some really low BP reading BP Readings 88/47 85/47 and is also experiencing dizziness Reason for Disposition  [1] Fall in systolic BP > 20 mm Hg from normal AND [2] dizzy, lightheaded, or weak  Answer Assessment - Initial Assessment Questions 1. BLOOD PRESSURE: "What is the blood pressure?" "Did you take at least two measurements 5 minutes apart?"     88/47 a few minutes ago, 102/54 now 2. ONSET: "When did you take your blood pressure?"     A few minutes ago 3. HOW: "How did you obtain the blood pressure?" (e.g., visiting nurse, automatic home BP monitor)     Automatic home BP monitor 4. HISTORY: "Do you have a history of low blood pressure?" "What is your blood pressure normally?"     No 5. MEDICINES: "Are you taking any medications for blood pressure?" If Yes, ask: "Have they been changed recently?"     Yes 6. PULSE RATE: "Do you know what your pulse rate is?"      55, a history of a fib 7. OTHER SYMPTOMS: "Have you been sick recently?" "Have you had a recent injury?"     Denies  Protocols used: Blood Pressure - Low-A-AH

## 2023-11-01 NOTE — ED Notes (Signed)
IV team at beside 

## 2023-11-01 NOTE — ED Notes (Signed)
 Unsuccessful PIV placement. Pt with hx of needing ultrasound IV placement. Pt requesting [place] IV placement due to history. Order placed.

## 2023-11-03 ENCOUNTER — Encounter: Payer: Self-pay | Admitting: Family Medicine

## 2023-11-03 DIAGNOSIS — N289 Disorder of kidney and ureter, unspecified: Secondary | ICD-10-CM

## 2023-11-05 ENCOUNTER — Encounter: Payer: Self-pay | Admitting: Family Medicine

## 2023-11-05 ENCOUNTER — Telehealth (HOSPITAL_COMMUNITY): Payer: Self-pay | Admitting: Cardiology

## 2023-11-05 ENCOUNTER — Ambulatory Visit: Payer: Self-pay | Attending: Cardiology

## 2023-11-05 DIAGNOSIS — I4891 Unspecified atrial fibrillation: Secondary | ICD-10-CM

## 2023-11-05 DIAGNOSIS — Z952 Presence of prosthetic heart valve: Secondary | ICD-10-CM

## 2023-11-05 DIAGNOSIS — I5032 Chronic diastolic (congestive) heart failure: Secondary | ICD-10-CM

## 2023-11-05 LAB — POCT INR: INR: 6.1 — AB (ref 2.0–3.0)

## 2023-11-05 MED ORDER — FUROSEMIDE 20 MG PO TABS: 20.0000 mg | ORAL_TABLET | Freq: Every day | ORAL | 3 refills | Status: DC

## 2023-11-05 NOTE — Telephone Encounter (Signed)
 Patient concerned he needs an urgent referrl due to retention causing his INR to change and is asking what he should do, I directed him to reach out to Dr Gala Romney for directions as the prescriber but that I would consult you as well.

## 2023-11-05 NOTE — Patient Instructions (Addendum)
 Description   Eat greens, HOLD today's and tomorrow's dose. Then, resume taking warfarin 1/2 tablet daily except 1 tablet on Sunday and Wednesday.  Recheck INR on Tuesday  SEEK IMMEDIATE MEDICAL ATTENTION IF BLEEDING OCCURS. Amio 200mg  daily.   Coumadin Clinic [phone] Cardiac clearance form Fax to [phone]

## 2023-11-05 NOTE — Telephone Encounter (Signed)
 Pt aware and voiced understanding

## 2023-11-05 NOTE — Telephone Encounter (Signed)
 Patient called to report fluid has increased.  Reports farxiga and lasix was stopped by ER provider due to AKI and low b/p readings   INR checked today results 6.1. was told this was due to elevated fluid status  Reports he is not SOB however noticed wheezing at night while laying in the bed  Weight 243 today (normal weight 240)  Reports BLE edema  Would like to know what he should do for fluid

## 2023-11-05 NOTE — Telephone Encounter (Signed)
 Restart lasix at 20 mg daily. Needs BMET/BNP early next week. If not improved by then he will need follow-up in clinic.

## 2023-11-09 ENCOUNTER — Ambulatory Visit: Payer: Self-pay | Attending: Cardiovascular Disease

## 2023-11-09 DIAGNOSIS — Z952 Presence of prosthetic heart valve: Secondary | ICD-10-CM

## 2023-11-09 DIAGNOSIS — I4891 Unspecified atrial fibrillation: Secondary | ICD-10-CM

## 2023-11-09 DIAGNOSIS — Z7901 Long term (current) use of anticoagulants: Secondary | ICD-10-CM

## 2023-11-09 LAB — POCT INR: INR: 1.4 — AB (ref 2.0–3.0)

## 2023-11-09 NOTE — Patient Instructions (Signed)
 Take 1 tablet today and 1.5 tablets tomorrow then continue  1/2 tablet daily except 1 tablet on Sunday and Wednesday.  INR in 2 weeks Amio 200mg  daily.   Coumadin Clinic [phone] Cardiac clearance form Fax to [phone]

## 2023-11-10 ENCOUNTER — Telehealth (HOSPITAL_COMMUNITY): Payer: Self-pay

## 2023-11-10 ENCOUNTER — Ambulatory Visit (HOSPITAL_COMMUNITY)
Admission: RE | Admit: 2023-11-10 | Discharge: 2023-11-10 | Disposition: A | Discharge status: Home / Self Care | Payer: Self-pay | Source: Ambulatory Visit | Attending: Cardiology | Admitting: Cardiology

## 2023-11-10 DIAGNOSIS — I5032 Chronic diastolic (congestive) heart failure: Secondary | ICD-10-CM

## 2023-11-10 LAB — BASIC METABOLIC PANEL
Anion gap: 6 (ref 5–15)
BUN: 35 mg/dL — ABNORMAL HIGH (ref 6–20)
CO2: 23 mmol/L (ref 22–32)
Calcium: 9.3 mg/dL (ref 8.9–10.3)
Chloride: 106 mmol/L (ref 98–111)
Creatinine, Ser: 1.89 mg/dL — ABNORMAL HIGH (ref 0.61–1.24)
GFR, Estimated: 40 mL/min — ABNORMAL LOW (ref >60)
Glucose, Bld: 105 mg/dL — ABNORMAL HIGH (ref 70–99)
Potassium: 5.2 mmol/L — ABNORMAL HIGH (ref 3.5–5.1)
Sodium: 135 mmol/L (ref 135–145)

## 2023-11-10 LAB — BRAIN NATRIURETIC PEPTIDE: B Natriuretic Peptide: 21.4 pg/mL (ref 0.0–100.0)

## 2023-11-10 MED ORDER — SPIRONOLACTONE 25 MG PO TABS: 12.5000 mg | ORAL_TABLET | Freq: Every day | ORAL | 3 refills | Status: DC

## 2023-11-10 NOTE — Telephone Encounter (Signed)
-----   Message from [facility] N sent at 11/10/2023 10:48 AM EDT ----- Potassium is elevated. Would cut back spiro to 12.5 mg daily. Repeat BMET in about 10 days.

## 2023-11-10 NOTE — Telephone Encounter (Signed)
 Patient's Cleda Daub medication has been changed and will be updated in patients med list. In addition, pt's labs ordered and appointment scheduled. Pt aware, agreeable, and verbalized understanding.

## 2023-11-15 ENCOUNTER — Ambulatory Visit (INDEPENDENT_AMBULATORY_CARE_PROVIDER_SITE_OTHER): Payer: Self-pay

## 2023-11-15 DIAGNOSIS — I4891 Unspecified atrial fibrillation: Secondary | ICD-10-CM | POA: Diagnosis not present

## 2023-11-15 NOTE — Progress Notes (Signed)
 Carelink Summary Report / Loop Recorder

## 2023-11-16 LAB — CUP PACEART REMOTE DEVICE CHECK
Date Time Interrogation Session: 20250316230821
Implantable Pulse Generator Implant Date: 20221031

## 2023-11-21 ENCOUNTER — Encounter: Payer: Self-pay | Admitting: Cardiology

## 2023-11-22 ENCOUNTER — Ambulatory Visit
Admission: RE | Admit: 2023-11-22 | Discharge: 2023-11-22 | Disposition: A | Discharge status: Home / Self Care | Payer: Self-pay | Source: Ambulatory Visit | Attending: Cardiology | Admitting: Cardiology

## 2023-11-22 ENCOUNTER — Ambulatory Visit: Payer: Self-pay

## 2023-11-22 DIAGNOSIS — I5032 Chronic diastolic (congestive) heart failure: Secondary | ICD-10-CM | POA: Insufficient documentation

## 2023-11-22 LAB — BASIC METABOLIC PANEL
Anion gap: 4 — ABNORMAL LOW (ref 5–15)
BUN: 23 mg/dL — ABNORMAL HIGH (ref 6–20)
CO2: 26 mmol/L (ref 22–32)
Calcium: 8.7 mg/dL — ABNORMAL LOW (ref 8.9–10.3)
Chloride: 105 mmol/L (ref 98–111)
Creatinine, Ser: 1.32 mg/dL — ABNORMAL HIGH (ref 0.61–1.24)
GFR, Estimated: >60 mL/min (ref >60)
Glucose, Bld: 95 mg/dL (ref 70–99)
Potassium: 4.3 mmol/L (ref 3.5–5.1)
Sodium: 135 mmol/L (ref 135–145)

## 2023-11-25 ENCOUNTER — Other Ambulatory Visit: Payer: Self-pay | Admitting: Nephrology

## 2023-11-25 DIAGNOSIS — N1831 Chronic kidney disease, stage 3a: Secondary | ICD-10-CM

## 2023-11-26 ENCOUNTER — Ambulatory Visit
Admission: RE | Admit: 2023-11-26 | Discharge: 2023-11-26 | Disposition: A | Discharge status: Home / Self Care | Payer: Self-pay | Source: Ambulatory Visit | Attending: Nephrology | Admitting: Nephrology

## 2023-11-26 DIAGNOSIS — N1831 Chronic kidney disease, stage 3a: Secondary | ICD-10-CM

## 2023-11-29 ENCOUNTER — Ambulatory Visit: Payer: Self-pay | Attending: Cardiology

## 2023-11-29 DIAGNOSIS — Z7901 Long term (current) use of anticoagulants: Secondary | ICD-10-CM | POA: Diagnosis not present

## 2023-11-29 DIAGNOSIS — I4891 Unspecified atrial fibrillation: Secondary | ICD-10-CM | POA: Diagnosis not present

## 2023-11-29 DIAGNOSIS — Z952 Presence of prosthetic heart valve: Secondary | ICD-10-CM | POA: Diagnosis not present

## 2023-11-29 LAB — POCT INR: INR: 5.3 — AB (ref 2.0–3.0)

## 2023-11-29 NOTE — Patient Instructions (Signed)
 Hold today and Tuesday then  continue  1/2 tablet daily except 1 tablet on Sunday and Wednesday.  INR in 2 weeks Amio 200mg  daily.   Coumadin Clinic [phone] Cardiac clearance form Fax to [phone]

## 2023-11-30 ENCOUNTER — Other Ambulatory Visit: Payer: Self-pay | Admitting: Internal Medicine

## 2023-11-30 ENCOUNTER — Other Ambulatory Visit: Payer: Self-pay | Admitting: Family Medicine

## 2023-11-30 DIAGNOSIS — Z7901 Long term (current) use of anticoagulants: Secondary | ICD-10-CM

## 2023-11-30 DIAGNOSIS — I4891 Unspecified atrial fibrillation: Secondary | ICD-10-CM

## 2023-11-30 DIAGNOSIS — Z952 Presence of prosthetic heart valve: Secondary | ICD-10-CM

## 2023-12-03 ENCOUNTER — Other Ambulatory Visit (HOSPITAL_COMMUNITY): Payer: Self-pay

## 2023-12-03 ENCOUNTER — Telehealth (HOSPITAL_COMMUNITY): Payer: Self-pay

## 2023-12-03 ENCOUNTER — Encounter (HOSPITAL_COMMUNITY): Payer: Self-pay | Admitting: Internal Medicine

## 2023-12-03 ENCOUNTER — Ambulatory Visit (HOSPITAL_COMMUNITY)
Admission: RE | Admit: 2023-12-03 | Discharge: 2023-12-03 | Disposition: A | Discharge status: Home / Self Care | Payer: Commercial Managed Care - HMO | Source: Ambulatory Visit | Attending: Internal Medicine | Admitting: Internal Medicine

## 2023-12-03 VITALS — BP 118/82 | HR 53 | Ht 70.0 in | Wt 253.4 lb

## 2023-12-03 DIAGNOSIS — I480 Paroxysmal atrial fibrillation: Secondary | ICD-10-CM

## 2023-12-03 DIAGNOSIS — Q2543 Congenital aneurysm of aorta: Secondary | ICD-10-CM | POA: Diagnosis not present

## 2023-12-03 DIAGNOSIS — Z952 Presence of prosthetic heart valve: Secondary | ICD-10-CM

## 2023-12-03 DIAGNOSIS — N179 Acute kidney failure, unspecified: Secondary | ICD-10-CM | POA: Diagnosis not present

## 2023-12-03 DIAGNOSIS — Z7984 Long term (current) use of oral hypoglycemic drugs: Secondary | ICD-10-CM | POA: Diagnosis not present

## 2023-12-03 DIAGNOSIS — I5032 Chronic diastolic (congestive) heart failure: Secondary | ICD-10-CM | POA: Diagnosis not present

## 2023-12-03 DIAGNOSIS — E785 Hyperlipidemia, unspecified: Secondary | ICD-10-CM | POA: Insufficient documentation

## 2023-12-03 DIAGNOSIS — Z7901 Long term (current) use of anticoagulants: Secondary | ICD-10-CM | POA: Diagnosis not present

## 2023-12-03 DIAGNOSIS — R911 Solitary pulmonary nodule: Secondary | ICD-10-CM | POA: Insufficient documentation

## 2023-12-03 DIAGNOSIS — G4733 Obstructive sleep apnea (adult) (pediatric): Secondary | ICD-10-CM

## 2023-12-03 DIAGNOSIS — Z79899 Other long term (current) drug therapy: Secondary | ICD-10-CM | POA: Diagnosis not present

## 2023-12-03 DIAGNOSIS — I130 Hypertensive heart and chronic kidney disease with heart failure and stage 1 through stage 4 chronic kidney disease, or unspecified chronic kidney disease: Secondary | ICD-10-CM | POA: Diagnosis not present

## 2023-12-03 DIAGNOSIS — N1832 Chronic kidney disease, stage 3b: Secondary | ICD-10-CM | POA: Insufficient documentation

## 2023-12-03 LAB — BASIC METABOLIC PANEL WITH GFR
Anion gap: 12 (ref 5–15)
BUN: 39 mg/dL — ABNORMAL HIGH (ref 6–20)
CO2: 22 mmol/L (ref 22–32)
Calcium: 9.2 mg/dL (ref 8.9–10.3)
Chloride: 103 mmol/L (ref 98–111)
Creatinine, Ser: 1.94 mg/dL — ABNORMAL HIGH (ref 0.61–1.24)
GFR, Estimated: 39 mL/min — ABNORMAL LOW (ref >60)
Glucose, Bld: 87 mg/dL (ref 70–99)
Potassium: 5.1 mmol/L (ref 3.5–5.1)
Sodium: 137 mmol/L (ref 135–145)

## 2023-12-03 LAB — BRAIN NATRIURETIC PEPTIDE: B Natriuretic Peptide: 42.7 pg/mL (ref 0.0–100.0)

## 2023-12-03 NOTE — Patient Instructions (Addendum)
 Great to see you today!!!  RESTART Mark [name], if not feeling better after restarting please let us know  Labs done today, your results will be available in MyChart, we will contact you for abnormal readings.  Your physician has requested that you have an echocardiogram. Echocardiography is a painless test that uses sound waves to create images of your heart. It provides your doctor with information about the size and shape of your heart and how well your heart's chambers and valves are working. This procedure takes approximately one hour. There are no restrictions for this procedure. Please do NOT wear cologne, perfume, aftershave, or lotions (deodorant is allowed). Please arrive 15 minutes prior to your appointment time.  Please note: We ask at that you not bring children with you during ultrasound (echo/ vascular) testing. Due to room size and safety concerns, children are not allowed in the ultrasound rooms during exams. Our front office staff cannot provide observation of children in our lobby area while testing is being conducted. An adult accompanying a patient to their appointment will only be allowed in the ultrasound room at the discretion of the ultrasound technician under special circumstances. We apologize for any inconvenience.   Your physician recommends that you schedule a follow-up appointment in: 3 months (July), **PLEASE CALL OUR OFFICE IN MAY TO SCHEDULE THIS APPOINTMENT  If you have any questions or concerns before your next appointment please send [place] a message through [place] or call our office at [phone].    TO LEAVE A MESSAGE FOR THE NURSE SELECT OPTION 2, PLEASE LEAVE A MESSAGE INCLUDING: YOUR NAME DATE OF BIRTH CALL BACK NUMBER REASON FOR CALL**this is important as we prioritize the call backs  YOU WILL RECEIVE A CALL BACK THE SAME DAY AS LONG AS YOU CALL BEFORE 4:00 PM At the [facility], you and your health needs are our priority. As part of our  continuing mission to provide you with exceptional heart care, we have created designated Provider Care Teams. These Care Teams include your primary Cardiologist (physician) and Advanced Practice Providers (APPs- Physician Assistants and Nurse Practitioners) who all work together to provide you with the care you need, when you need it.   You may see any of the following providers on your designated Care Team at your next follow up: Dr Arvilla Meres Dr Marca Ancona Dr. Dorthula Nettles Dr. Clearnce Hasten Amy Filbert Schilder, NP Robbie Lis, [place] [facility] [place], [place] Brynda Peon, NP [place] Lee, NP Clarisa Kindred, NP Karle Plumber, PharmD Enos Fling, PharmD   Please be sure to bring in all your medications bottles to every appointment.    Thank you for choosing  [facility]

## 2023-12-03 NOTE — Progress Notes (Signed)
 Patient ID: TICO CROTTEAU, male   DOB: [birth year], 63 y.o.   MRN: [id number]    [facility] Note    PCP: [name] HF: Dr. Gala Romney   HPI:  [name] is a 63 year old male with a history of bicuspid aortic valve complicated by endocarditis.  He is status post St. Jude mechanical aortic valve replacement in 1983.  He also has a history of hyperlipidemia and PAF s/p DC-CV in 9/11. OSA noncompliant with CPAP.   Over the past few years year, he has had some progression in the gradients across his valve.  He underwent cardiac catheterization in May 2009 which showed normal coronary arteries.The valve leaflets were seen to be opening well on fluoroscopy.  Had TEE.  While there was some turbulence around the valve, the leaflets seemed to be moving well.  There was no obvious pannus formation.  Gradient on his transthoracic echo was within the moderate range at 33. 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 He was seen by Dr. [name] who agreed  with continue watchul waiting.   Hasalso struggled with AF/palpitations.  Has seen Dr. Johney Frame and had two ablations in 8/13 and 3/14. Now has Linq monitor in (placed 04/06/17). Seen in AF Clinic in 7/21 AF burden was low. Most recent device interrogation from 12/21 reviewed AF burden 13.9% with longest event 49 hr duration (overall AF burden over life of ILR ~5%).  CT chest 4/22: Stable dilation of ascending aorta 4.8 cm CT chest 5/24 AscAo 5.2cm new pulmonary nodule - Has seen Dr. Laneta Simmers. Continue f/u for now  Echo 10/22 EF 60% moderate AS mean gradient Personally reviewed Echo 08/26/22 EF 50-55% moderate AS mean gradient Asc ao 5.0 Personally reviewed  Underwent excision of large blood-filled pre-patellar bursa on his leg on 05/10/23.   Echo 07/05/23: EF 50-55% RV ok. AVR MG 27 mmHg, EOA 1.2 cm^2, DI 0.31   Here for routine f/u with his wife. Feels much better. Walking an hour a day at a good pace. Lasix  increased from 20 -> 40 with decreased ReDS 55% -> 39%  SCr up 1.5 -> 2.7. Now back on lasix 20. Also out of [place] for a month due to insurance. Dizzy on standing   ReDS today: Poor quality/unreadable    ABPM looks good 109/74 (Personally reviewed)   Cardiac studies:  Echo 2/21: EF 60-65% Aortic valve mean gradient measures 29.0 mmHg. Aortic valve peak gradient  measures 57.8 mmHg. AVA 1.2 cm2 Personally reviewed  Echo 6/19: EF 55-60% AoV Mean gradient (S): 27 mm Hg  Echo 7/18  EF 55-60% AoV Mean gradient (S): 26 mm Hg. Peak gradient (S): 51 mm Hg. AVA 1.35 cm2 Echo 12/14/2014 LVEF 55-60%, Aortic valve gradient 44 to 60 mm Hg, increased from 22 to 35 mm Hg. Echo 4/17 EF 60-65%  AoV Mean gradient (S): 34 mm Hg. Peak gradient (S): 63 mm Hg.  Review of systems complete and found to be negative unless listed in HPI.   Past Medical History:  Diagnosis Date   ADHD (attention deficit hyperactivity disorder)    Aortic stenosis    a. due to bicuspid AoV; 1983 S/p mechanical AVR (St. Jude) - chronic coumadin with INR run between 3.5-4.5;  b. 03/2012 Echo: EF 60%, nl wall motion, mod dil LA.   Arthritis    Ascending aortic aneurysm [facility])    Atrial tachycardia (HCC)    a. admitted 07/2012   CAD (coronary artery  disease)    CHF (congestive heart failure) (HCC)    COVID janurary 2022   Dysrhythmia    ED (erectile dysfunction)    Gallstones    Headache(784.0)    Hepatitis A    a. as a child (from [place])   History of kidney stones    Hypertension    OSA (obstructive sleep apnea)    a. noncompliant with CPAP   Paroxysmal atrial fibrillation (HCC)    s/p afib ablation x 2   Pneumonia     Current Outpatient Medications  Medication Sig Dispense Refill   albuterol (VENTOLIN HFA) 108 (90 Base) MCG/ACT inhaler TAKE 2 PUFFS BY MOUTH EVERY 6 HOURS AS NEEDED FOR WHEEZE OR SHORTNESS OF BREATH 8.5 each 1   amiodarone (PACERONE) 200 MG tablet TAKE 1 TABLET BY MOUTH EVERY DAY 30 tablet 5    aspirin EC 81 MG tablet Take 81 mg by mouth at bedtime.      carvedilol (COREG) 6.25 MG tablet Take 6.25 mg by mouth daily.     FARXIGA 10 MG TABS tablet TAKE 1 TABLET BY MOUTH DAILY BEFORE BREAKFAST. 30 tablet 11   furosemide (LASIX) 20 MG tablet Take 1 tablet (20 mg total) by mouth daily. 30 tablet 3   ibuprofen (ADVIL) 200 MG tablet Take 600 mg by mouth as needed for moderate pain (pain score 4-6) or headache.     lisinopril (ZESTRIL) 40 MG tablet TAKE 1 TABLET BY MOUTH EVERY DAY 30 tablet 11   pantoprazole (PROTONIX) 40 MG tablet TAKE 1 TABLET BY MOUTH EVERY DAY 30 tablet 1   simvastatin (ZOCOR) 20 MG tablet TAKE 1 TABLET BY MOUTH EVERYDAY AT BEDTIME 30 tablet 5   spironolactone (ALDACTONE) 25 MG tablet Take 0.5 tablets (12.5 mg total) by mouth daily. Can you cancel any previous doses of this medication 45 tablet 3   warfarin (COUMADIN) 10 MG tablet TAKE 1/2 TO 1 TABLET BY MOUTH DAILY AS DIRECTED BY COUMADIN CLINIC 90 tablet 0   promethazine-dextromethorphan (PROMETHAZINE-DM) 6.25-15 MG/5ML syrup Take 5 mLs by mouth 4 (four) times daily as needed. 240 mL 0   No current facility-administered medications for this encounter.    PHYSICAL EXAM: Vitals:   12/03/23 1142  BP: 118/82  Pulse: (!) 53  SpO2: 96%  Weight: 114.9 kg (253 lb 6.4 oz)  Height: 5\' 10"  (1.778 m)    Sitting 138/84 Standing 142/88  Wt Readings from Last 3 Encounters:  12/03/23 114.9 kg (253 lb 6.4 oz)  11/01/23 108.9 kg (240 lb)  10/08/23 114.1 kg (251 lb 9.6 oz)   General:  Well appearing. No resp difficulty HEENT: normal Neck: supple. no JVD. Carotids 2+ bilat; no bruits. No lymphadenopathy or thryomegaly appreciated. Cor: PMI nondisplaced. Regular rate & rhythm. Mechanical s2 2/6 SEM S2 mildly decreased Lungs: clear Abdomen: obese soft, nontender, nondistended.No bruits or masses. Good bowel sounds. Extremities: no cyanosis, clubbing, rash, edema Neuro: alert & orientedx3, cranial nerves grossly intact.  moves all 4 extremities w/o difficulty. Affect pleasant  Assessment & Plan:  1. Aortic stenosis s/p mechanical AVR - Echo 03/02/17 EF normal Mean AoV gradient 26.  - Echo 6/19 AVR stable mean gradient 27 - Echo 2/21: EF 60-65% Aortic valve mean gradient measures 29.0 mmHg. Aortic valve peak gradient measures 57.8 mmHg. AVA 1.2 cm2  - Echo 10/22 EF 60% moderate AS mean gradient Personally reviewed - Echo 08/26/22 EF 50-55% moderate AS mean gradient Asc ao 5.0 Personally reviewed - Echo 07/05/23: EF  50-55% RV ok. AVR MG 27 mmHg, EOA 1.2 cm^2, DI 0.31 Aoroot 4.9 cm - AVR stable. Continue to follow with serial echos. Has seen Dr. Laneta Simmers recently who felt AVR and Ascending Ao diat - Warfarin per coumadin clinic. No recent bleeding - Aware of SBE prophylaxis - His symptoms have been increasingly hard to manage. Concern for worsening AS/HF. Plan as below.   2. Chronic diastolic HF - Symptoms improved on higher dose of lasix but had to be cut back due to AKI and now worse NYHA II-III - Volume status hard to assess - ReDs had come down 55%-> 39% but now poor quality (? High reading) - Will restart Marcelline Deist - If symptoms persist with need RHC + likely coronary angio - May need TEE to get better picture of AVR   3. HTN - Blood pressure well controlled. Continue current regimen. - ABPM looks good.  4. Aortic root aneurysm - Followed by Dr. Laneta Simmers - CT scan 2/20 which measures 5.0 x 4.9 cm, previously 4.8 x 4.6 cm measured similarly on examinations previously - CT chest 4/22:  - CT chest 5/24 AscAo 5.2cm new pulmonary nodule  - Echo 07/05/23: EF 50-55% RV ok. AVR MG 27 mmHg, EOA 1.2 cm^2, DI 0.31  AoRoot 4.9cm - Continue to follow closely  5. CKD 3b with recent AKI - Scr 1.5 -> 2.7 - lasix cut back - recheck renal function today - Restart Marcelline Deist - following with Nephrology   6. Hyperlipidemia - Followed by Dr. Beverely Low .  7. Paroxysmal Atrial fibrillation  - Followed by Dr.  Lalla Brothers - Remains in NSR on po amio - Continue warfarin with mAVR - no bleeding   8. OSA - unable to tolerate to CPAP - continue weight loss efforts  9. Obesity - attempting to get GLP1RA through PCP - Continue diet and weight loss efforts  I spent a total of 45 minutes today: 1) reviewing the patient's medical records including previous charts, labs and recent notes from other providers; 2) examining the patient and counseling them on their medical issues/explaining the plan of care; 3) adjusting meds as needed and 4) ordering lab work or other needed tests.    Arvilla Meres, MD 11:57 AM

## 2023-12-03 NOTE — Telephone Encounter (Signed)
 Advanced Heart Failure Patient Advocate Encounter  Prior authorization for Marcelline Deist has been submitted and approved. Test billing returns $4 for 90 day supply.  Key: [id number] Effective: 12/03/2023 to 12/02/2024  Burnell Blanks, CPhT Rx Patient Advocate Phone: [phone]

## 2023-12-13 ENCOUNTER — Ambulatory Visit

## 2023-12-15 ENCOUNTER — Ambulatory Visit: Attending: Cardiology

## 2023-12-15 DIAGNOSIS — I4891 Unspecified atrial fibrillation: Secondary | ICD-10-CM | POA: Diagnosis not present

## 2023-12-15 DIAGNOSIS — Z952 Presence of prosthetic heart valve: Secondary | ICD-10-CM

## 2023-12-15 LAB — POCT INR: INR: 3.7 — AB (ref 2.0–3.0)

## 2023-12-15 NOTE — Patient Instructions (Signed)
 Description   Continue 1/2 tablet daily except 1 tablet on Sunday and Wednesday.  INR in 3 weeks Amio 200mg  daily.   Coumadin Clinic [phone] Cardiac clearance form Fax to [phone]

## 2023-12-20 ENCOUNTER — Ambulatory Visit (HOSPITAL_COMMUNITY)
Admission: RE | Admit: 2023-12-20 | Discharge: 2023-12-20 | Disposition: A | Discharge status: Home / Self Care | Source: Ambulatory Visit | Attending: Internal Medicine | Admitting: Internal Medicine

## 2023-12-20 ENCOUNTER — Ambulatory Visit (INDEPENDENT_AMBULATORY_CARE_PROVIDER_SITE_OTHER): Payer: Self-pay

## 2023-12-20 DIAGNOSIS — I081 Rheumatic disorders of both mitral and tricuspid valves: Secondary | ICD-10-CM | POA: Diagnosis not present

## 2023-12-20 DIAGNOSIS — Z9889 Other specified postprocedural states: Secondary | ICD-10-CM | POA: Insufficient documentation

## 2023-12-20 DIAGNOSIS — I7121 Aneurysm of the ascending aorta, without rupture: Secondary | ICD-10-CM | POA: Diagnosis not present

## 2023-12-20 DIAGNOSIS — I480 Paroxysmal atrial fibrillation: Secondary | ICD-10-CM

## 2023-12-20 DIAGNOSIS — E785 Hyperlipidemia, unspecified: Secondary | ICD-10-CM | POA: Insufficient documentation

## 2023-12-20 DIAGNOSIS — I5032 Chronic diastolic (congestive) heart failure: Secondary | ICD-10-CM

## 2023-12-20 DIAGNOSIS — I2510 Atherosclerotic heart disease of native coronary artery without angina pectoris: Secondary | ICD-10-CM | POA: Diagnosis not present

## 2023-12-20 LAB — ECHOCARDIOGRAM COMPLETE
AV Mean grad: 33 mmHg
AV Peak grad: 57 mmHg
Ao pk vel: 3.78 m/s
Area-P 1/2: 3.83 cm2
S' Lateral: 3.9 cm

## 2023-12-21 LAB — CUP PACEART REMOTE DEVICE CHECK
Date Time Interrogation Session: 20250420230733
Implantable Pulse Generator Implant Date: 20221031

## 2023-12-26 ENCOUNTER — Encounter: Payer: Self-pay | Admitting: Cardiology

## 2023-12-28 NOTE — Addendum Note (Signed)
 Addended by: Edra Govern D on: 12/28/2023 05:09 PM   Modules accepted: Orders

## 2023-12-28 NOTE — Progress Notes (Signed)
 Carelink Summary Report / Loop Recorder

## 2024-01-05 ENCOUNTER — Ambulatory Visit: Attending: Cardiology

## 2024-01-05 DIAGNOSIS — Z952 Presence of prosthetic heart valve: Secondary | ICD-10-CM | POA: Diagnosis not present

## 2024-01-05 DIAGNOSIS — I4891 Unspecified atrial fibrillation: Secondary | ICD-10-CM | POA: Insufficient documentation

## 2024-01-05 LAB — POCT INR: INR: 4.2 — AB (ref 2.0–3.0)

## 2024-01-05 NOTE — Patient Instructions (Signed)
 Description   Continue 1/2 tablet daily except 1 tablet on Sunday and Wednesday.  INR in 4 weeks Amio 200mg  daily.   Coumadin  Clinic [phone] Cardiac clearance form Fax to [phone]

## 2024-01-07 ENCOUNTER — Other Ambulatory Visit (HOSPITAL_COMMUNITY): Payer: Self-pay | Admitting: Internal Medicine

## 2024-01-07 ENCOUNTER — Other Ambulatory Visit (HOSPITAL_COMMUNITY): Payer: Self-pay | Admitting: Physician Assistant

## 2024-01-07 DIAGNOSIS — I5032 Chronic diastolic (congestive) heart failure: Secondary | ICD-10-CM

## 2024-01-08 DIAGNOSIS — B354 Tinea corporis: Secondary | ICD-10-CM | POA: Diagnosis not present

## 2024-01-08 DIAGNOSIS — L820 Inflamed seborrheic keratosis: Secondary | ICD-10-CM | POA: Diagnosis not present

## 2024-01-08 DIAGNOSIS — D225 Melanocytic nevi of trunk: Secondary | ICD-10-CM | POA: Diagnosis not present

## 2024-01-25 ENCOUNTER — Ambulatory Visit (INDEPENDENT_AMBULATORY_CARE_PROVIDER_SITE_OTHER): Payer: Self-pay

## 2024-01-25 DIAGNOSIS — I4891 Unspecified atrial fibrillation: Secondary | ICD-10-CM

## 2024-01-25 LAB — CUP PACEART REMOTE DEVICE CHECK
Date Time Interrogation Session: 20250526231325
Implantable Pulse Generator Implant Date: 20221031

## 2024-01-26 ENCOUNTER — Ambulatory Visit: Payer: Self-pay | Admitting: Cardiology

## 2024-01-27 DIAGNOSIS — M25511 Pain in right shoulder: Secondary | ICD-10-CM | POA: Diagnosis not present

## 2024-01-28 DIAGNOSIS — M25511 Pain in right shoulder: Secondary | ICD-10-CM | POA: Diagnosis not present

## 2024-02-02 ENCOUNTER — Ambulatory Visit: Attending: Cardiology | Admitting: *Deleted

## 2024-02-02 DIAGNOSIS — I4891 Unspecified atrial fibrillation: Secondary | ICD-10-CM | POA: Diagnosis not present

## 2024-02-02 DIAGNOSIS — Z7901 Long term (current) use of anticoagulants: Secondary | ICD-10-CM | POA: Diagnosis not present

## 2024-02-02 DIAGNOSIS — Z952 Presence of prosthetic heart valve: Secondary | ICD-10-CM | POA: Diagnosis not present

## 2024-02-02 LAB — POCT INR: INR: 5.7 — AB (ref 2.0–3.0)

## 2024-02-02 NOTE — Patient Instructions (Signed)
 Description   Do not take any warfarin today then continue taking warfarin 1/2 tablet daily except 1 tablet on Sunday and Wednesday.  INR in 3 weeks Amio 200mg  daily.   Coumadin  Clinic [phone] Cardiac clearance form Fax to [phone]

## 2024-02-04 NOTE — Addendum Note (Signed)
 Addended by: Edra Govern D on: 02/04/2024 10:18 AM   Modules accepted: Orders

## 2024-02-04 NOTE — Progress Notes (Signed)
 Carelink Summary Report / Loop Recorder

## 2024-02-19 DIAGNOSIS — L821 Other seborrheic keratosis: Secondary | ICD-10-CM | POA: Diagnosis not present

## 2024-02-19 DIAGNOSIS — B354 Tinea corporis: Secondary | ICD-10-CM | POA: Diagnosis not present

## 2024-02-19 DIAGNOSIS — B351 Tinea unguium: Secondary | ICD-10-CM | POA: Diagnosis not present

## 2024-02-22 DIAGNOSIS — H524 Presbyopia: Secondary | ICD-10-CM | POA: Diagnosis not present

## 2024-02-23 ENCOUNTER — Ambulatory Visit: Attending: Cardiology | Admitting: *Deleted

## 2024-02-23 DIAGNOSIS — Z7901 Long term (current) use of anticoagulants: Secondary | ICD-10-CM | POA: Insufficient documentation

## 2024-02-23 DIAGNOSIS — Z952 Presence of prosthetic heart valve: Secondary | ICD-10-CM | POA: Diagnosis not present

## 2024-02-23 DIAGNOSIS — I4891 Unspecified atrial fibrillation: Secondary | ICD-10-CM | POA: Insufficient documentation

## 2024-02-23 LAB — POCT INR: INR: 4 — AB (ref 2.0–3.0)

## 2024-02-23 NOTE — Patient Instructions (Signed)
 Description   Continue taking warfarin 1/2 tablet daily except 1 tablet on Sunday and Wednesday. Recheck INR in 4 weeks Amio 200mg  daily.   Coumadin  Clinic [phone] Cardiac clearance form Fax to [phone]

## 2024-02-23 NOTE — Progress Notes (Signed)
Please see anticoagulation encounter.

## 2024-02-24 ENCOUNTER — Ambulatory Visit (INDEPENDENT_AMBULATORY_CARE_PROVIDER_SITE_OTHER): Payer: Self-pay

## 2024-02-24 ENCOUNTER — Ambulatory Visit: Payer: Self-pay | Admitting: Cardiology

## 2024-02-24 DIAGNOSIS — I4891 Unspecified atrial fibrillation: Secondary | ICD-10-CM

## 2024-02-24 DIAGNOSIS — H5213 Myopia, bilateral: Secondary | ICD-10-CM | POA: Diagnosis not present

## 2024-02-24 LAB — CUP PACEART REMOTE DEVICE CHECK
Date Time Interrogation Session: 20250625230852
Implantable Pulse Generator Implant Date: 20221031

## 2024-02-28 ENCOUNTER — Other Ambulatory Visit (HOSPITAL_COMMUNITY): Payer: Self-pay | Admitting: Internal Medicine

## 2024-02-29 NOTE — Progress Notes (Signed)
 Anticoag encounter

## 2024-03-06 ENCOUNTER — Other Ambulatory Visit: Payer: Self-pay | Admitting: Family Medicine

## 2024-03-06 ENCOUNTER — Other Ambulatory Visit: Payer: Self-pay | Admitting: Cardiology

## 2024-03-06 DIAGNOSIS — I4891 Unspecified atrial fibrillation: Secondary | ICD-10-CM

## 2024-03-06 DIAGNOSIS — Z952 Presence of prosthetic heart valve: Secondary | ICD-10-CM

## 2024-03-13 NOTE — Progress Notes (Signed)
 Carelink Summary Report / Loop Recorder

## 2024-03-15 ENCOUNTER — Other Ambulatory Visit: Payer: Self-pay | Admitting: Internal Medicine

## 2024-03-16 DIAGNOSIS — H5203 Hypermetropia, bilateral: Secondary | ICD-10-CM | POA: Diagnosis not present

## 2024-03-21 ENCOUNTER — Other Ambulatory Visit (HOSPITAL_COMMUNITY): Payer: Self-pay | Admitting: Internal Medicine

## 2024-03-21 DIAGNOSIS — M25511 Pain in right shoulder: Secondary | ICD-10-CM | POA: Diagnosis not present

## 2024-03-22 ENCOUNTER — Ambulatory Visit: Attending: Cardiology

## 2024-03-22 DIAGNOSIS — Z7901 Long term (current) use of anticoagulants: Secondary | ICD-10-CM | POA: Insufficient documentation

## 2024-03-22 DIAGNOSIS — I4891 Unspecified atrial fibrillation: Secondary | ICD-10-CM | POA: Diagnosis not present

## 2024-03-22 DIAGNOSIS — Z952 Presence of prosthetic heart valve: Secondary | ICD-10-CM | POA: Insufficient documentation

## 2024-03-22 LAB — POCT INR: INR: 2.8 (ref 2.0–3.0)

## 2024-03-22 NOTE — Patient Instructions (Signed)
 Description   Take 10mg  today and tomorrow, then resume same dosage of warfarin 1/2 tablet daily except 1 tablet on Sunday and Wednesday. Recheck INR in 2 weeks Amio 200mg  daily.   Coumadin  Clinic [phone] Cardiac clearance form Fax to [phone]

## 2024-03-22 NOTE — Progress Notes (Signed)
 INR 2.8, Please see anticoagulation encounter

## 2024-03-27 ENCOUNTER — Ambulatory Visit: Payer: Self-pay

## 2024-03-27 DIAGNOSIS — M19211 Secondary osteoarthritis, right shoulder: Secondary | ICD-10-CM | POA: Diagnosis not present

## 2024-03-27 DIAGNOSIS — I4891 Unspecified atrial fibrillation: Secondary | ICD-10-CM | POA: Diagnosis not present

## 2024-03-28 ENCOUNTER — Telehealth: Payer: Self-pay | Admitting: Family Medicine

## 2024-03-28 ENCOUNTER — Other Ambulatory Visit: Payer: Self-pay | Admitting: Orthopedic Surgery

## 2024-03-28 LAB — CUP PACEART REMOTE DEVICE CHECK
Date Time Interrogation Session: 20250727231521
Implantable Pulse Generator Implant Date: 20221031

## 2024-03-28 NOTE — Telephone Encounter (Signed)
 Type of form received: Surgical Clearance  Additional comments:   Received by: Fax  Form should be Faxed/mailed to: (address/ fax #) [phone]  Is patient requesting call for pickup: N/A  Form placed:  Labeled & placed in provider bin  Attach charge sheet.  Provider will determine charge.  Individual made aware of 3-5 business day turn around? N/A

## 2024-03-28 NOTE — Telephone Encounter (Signed)
 Appt made for 04/26/2024 Cardiac clearance is being completed 03/29/2024

## 2024-03-29 ENCOUNTER — Other Ambulatory Visit: Payer: Self-pay

## 2024-03-29 ENCOUNTER — Ambulatory Visit: Attending: Cardiology | Admitting: Cardiology

## 2024-03-29 ENCOUNTER — Encounter: Payer: Self-pay | Admitting: Cardiology

## 2024-03-29 VITALS — BP 102/66 | HR 60 | Ht 70.0 in | Wt 252.0 lb

## 2024-03-29 DIAGNOSIS — I4891 Unspecified atrial fibrillation: Secondary | ICD-10-CM

## 2024-03-29 DIAGNOSIS — Z79899 Other long term (current) drug therapy: Secondary | ICD-10-CM | POA: Diagnosis not present

## 2024-03-29 NOTE — Telephone Encounter (Signed)
 Noted - will leave forms in Dr. Charis folder for planned visit 8/27.

## 2024-03-29 NOTE — Patient Instructions (Addendum)
 Medication Instructions:  Your physician recommends that you continue on your current medications as directed. Please refer to the Current Medication list given to you today.  *If you need a refill on your cardiac medications before your next appointment, please call your pharmacy*  Labs TODAY: CMET, TSH, T4  Follow-Up: At [facility], you and your health needs are our priority.  As part of our continuing mission to provide you with exceptional heart care, our providers are all part of one team.  This team includes your primary Cardiologist (physician) and Advanced Practice Providers or APPs (Physician Assistants and Nurse Practitioners) who all work together to provide you with the care you need, when you need it.  Your next appointment:   6 months  Provider:   You will see one of the following Advanced Practice Providers on your designated Care Team:   Charlies Arthur, PA-C [name] Andy Tillery, PA-C Suzann Riddle, NP Daphne Barrack, NP

## 2024-03-29 NOTE — Telephone Encounter (Signed)
 Obtained documents from front desk. Placed in provider folder for review.

## 2024-03-29 NOTE — Progress Notes (Signed)
  Electrophysiology Office Follow up Visit Note:    Date:  03/29/2024   ID:  [place], Mark [name] 20, 1962, MRN [id number]  PCP:  Mahlon Comer BRAVO, MD  [facility] Cardiologist:  OLE ONEIDA HOLTS, MD  [facility] Electrophysiologist:  OLE ONEIDA HOLTS, MD    Interval History:     Mark [name] is a 63 y.o. male who presents for a follow up visit.   I last saw the patient June 30, 2022 for persistent atrial fibrillation.  He has had 3 prior catheter ablations and was on amiodarone  in the past.  He has a loop recorder in place.  He last saw Dr. Cherrie on December 03, 2023.  He has mechanical aortic valve and is on Coumadin .        Past medical, surgical, social and family history were reviewed.  ROS:   Please see the history of present illness.    All other systems reviewed and are negative.  EKGs/Labs/Other Studies Reviewed:    The following studies were reviewed today:  Recent loop recorder interrogation.  No atrial fibrillation.  December 20, 2023 echo EF 55% RV normal Mild MR Mechanical aortic valve in situ    EKG Interpretation Date/Time:  Wednesday March 29 2024 16:15:31 EDT Ventricular Rate:  60 PR Interval:  176 QRS Duration:  118 QT Interval:  402 QTC Calculation: 402 R Axis:   82  Text Interpretation: Normal sinus rhythm Confirmed by HOLTS OLE [phone]) on 03/29/2024 7:32:06 PM    Physical Exam:    VS:  BP 102/66   Pulse 60   Ht 5' 10 (1.778 m)   Wt 252 lb (114.3 kg)   SpO2 97%   BMI 36.16 kg/m     Wt Readings from Last 3 Encounters:  03/29/24 252 lb (114.3 kg)  12/03/23 253 lb 6.4 oz (114.9 kg)  11/01/23 240 lb (108.9 kg)     GEN: no distress CARD: RRR, No MRG.  Crisp mechanical valve sounds. RESP: No IWOB. CTAB.      ASSESSMENT:    1. Atrial fibrillation, unspecified type (HCC)    PLAN:    In order of problems listed above:  #Persistent atrial fibrillation #High risk med  monitoring-amiodarone  Doing well on amiodarone .  Maintaining normal rhythm.  On Coumadin  for stroke prophylaxis given mechanical aortic valve. I recommended he stay on amiodarone  until at least a few months after his shoulder surgery.  If he maintains normal rhythm, can consider reducing dose of amiodarone  to 100 mg by mouth once daily at the 29-month follow-up appointment.  Will update CMP, TSH and free T4 today.  Follow-up 6 months with APP   Signed, OLE HOLTS, MD, [facility], [facility] 03/29/2024 7:34 PM    Electrophysiology  [facility]

## 2024-03-30 ENCOUNTER — Ambulatory Visit: Payer: Self-pay | Admitting: Cardiology

## 2024-03-30 LAB — COMPREHENSIVE METABOLIC PANEL WITH GFR
ALT: 21 IU/L (ref 0–44)
AST: 21 IU/L (ref 0–40)
Albumin: 4.8 g/dL (ref 3.8–4.9)
Alkaline Phosphatase: 77 IU/L (ref 44–121)
BUN/Creatinine Ratio: 18 (ref 10–24)
BUN: 28 mg/dL — ABNORMAL HIGH (ref 8–27)
Bilirubin Total: 0.7 mg/dL (ref 0.0–1.2)
CO2: 20 mmol/L (ref 20–29)
Calcium: 9.8 mg/dL (ref 8.6–10.2)
Chloride: 98 mmol/L (ref 96–106)
Creatinine, Ser: 1.57 mg/dL — ABNORMAL HIGH (ref 0.76–1.27)
Globulin, Total: 2.4 g/dL (ref 1.5–4.5)
Glucose: 94 mg/dL (ref 70–99)
Potassium: 4.5 mmol/L (ref 3.5–5.2)
Sodium: 136 mmol/L (ref 134–144)
Total Protein: 7.2 g/dL (ref 6.0–8.5)
eGFR: 50 mL/min/1.73 — ABNORMAL LOW (ref >59)

## 2024-03-30 LAB — T4, FREE: Free T4: 1.51 ng/dL (ref 0.82–1.77)

## 2024-03-30 LAB — TSH: TSH: 1.94 u[IU]/mL (ref 0.450–4.500)

## 2024-03-31 ENCOUNTER — Telehealth (HOSPITAL_COMMUNITY): Payer: Self-pay

## 2024-03-31 NOTE — Telephone Encounter (Signed)
  AD[facility]ED [facility] CLIN[facility]   Pre-operative Risk Assessment       Request for Surgical Clearance    Procedure:  Right reverse total shoulder arthroplasty   Date of Surgery:  Clearance 05/11/24                              { Surgeon:  Eva Herring, MD Surgeon's Group or Practice Name:  [facility] Phone number:  [phone] Fax number:  [phone] { Type of Clearance Requested:   - Medical  - Pharmacy:  Hold Aspirin  and Warfarin (Coumadin )    Type of Anesthesia:  General    Additional requests/questions:  Please fax a copy of clearance  to the surgeon's office.  SignedAndriette NOVAK Edgel Degnan  RN 03/31/2024, 9:24 AM   [facility]

## 2024-04-04 NOTE — Telephone Encounter (Signed)
 [place], Harlene HERO, FNP to Me (Selected Message)    03/31/24  4:18 PM He was not doing well at follow up 4/25 with Dr. Cherrie. Needs in-person appt to address cardiac clearance before surgery.   Called and scheduled patient to see APP on Monday 04/10/24. Patient aware of appointment time and date.   Advised patient to call back to office with any issues, questions, or concerns. Patient verbalized understanding.

## 2024-04-05 ENCOUNTER — Other Ambulatory Visit (HOSPITAL_COMMUNITY): Payer: Self-pay

## 2024-04-05 ENCOUNTER — Ambulatory Visit: Attending: Cardiology

## 2024-04-05 ENCOUNTER — Other Ambulatory Visit (HOSPITAL_COMMUNITY): Payer: Self-pay | Admitting: Internal Medicine

## 2024-04-05 ENCOUNTER — Telehealth: Payer: Self-pay

## 2024-04-05 DIAGNOSIS — I4891 Unspecified atrial fibrillation: Secondary | ICD-10-CM | POA: Insufficient documentation

## 2024-04-05 DIAGNOSIS — Z952 Presence of prosthetic heart valve: Secondary | ICD-10-CM | POA: Diagnosis not present

## 2024-04-05 DIAGNOSIS — I5032 Chronic diastolic (congestive) heart failure: Secondary | ICD-10-CM

## 2024-04-05 LAB — POCT INR: INR: 3.6 — AB (ref 2.0–3.0)

## 2024-04-05 MED ORDER — FUROSEMIDE 20 MG PO TABS: 20.0000 mg | ORAL_TABLET | Freq: Every day | ORAL | 3 refills | Status: DC

## 2024-04-05 NOTE — Telephone Encounter (Signed)
...     Pre-operative Risk Assessment    Patient Name: Mark [name]  DOB: Apr 21, 1961 MRN: [id number]   Date of last office visit: 03/29/24 Date of next office visit: NONE   Request for Surgical Clearance    Procedure:  RIGHT REVERSE TOTAL SHOULDER ARTHROPLASTY  Date of Surgery:  Clearance TBD                                Surgeon:  DR EVA HERRING Surgeon's Group or Practice Name: [facility] Phone number:  [phone] Fax number:  [phone]   Type of Clearance Requested:   - Medical  - Pharmacy:  Hold Aspirin  and Warfarin (Coumadin )     Type of Anesthesia:  CHOICE   Additional requests/questions:    Bonney Teressa Rumalda Ronal   04/05/2024, 3:29 PM

## 2024-04-05 NOTE — Telephone Encounter (Signed)
 Pharmacy please advise on holding coumadin  prior to RIGHT REVERSE TOTAL SHOULDER ARTHROPLASTY  scheduled for TBD. Last lab 03/29/2024 (CMET) and 11/01/2023 (CBC) Thank you.

## 2024-04-05 NOTE — Patient Instructions (Signed)
 Description   Continue warfarin 1/2 tablet daily except 1 tablet on Sunday and Wednesday.  Recheck INR in 3 weeks Amio 200mg  daily.   Coumadin  Clinic [phone] Cardiac clearance form Fax to [phone]

## 2024-04-05 NOTE — Progress Notes (Signed)
 INR 3.6  Please see anticoagulation encounter

## 2024-04-07 ENCOUNTER — Telehealth (HOSPITAL_COMMUNITY): Payer: Self-pay

## 2024-04-07 NOTE — Progress Notes (Signed)
 Patient ID: Mark [name], male   DOB: 1961-02-24, 63 y.o.   MRN: [id number]    Advanced Heart Failure Clinic Note    PCP: [name] HF: Dr. Cherrie   HPI:  Mark [name] is a 63 year old male with a history of bicuspid aortic valve complicated by endocarditis.  He is status post St. Jude mechanical aortic valve replacement in 1983.  He also has a history of hyperlipidemia and PAF s/p DC-CV in 9/11. OSA noncompliant with CPAP.   Over the past few years year, he has had some progression in the gradients across his valve.  He underwent cardiac catheterization in May 2009 which showed normal coronary arteries.The valve leaflets were seen to be opening well on fluoroscopy.  Had TEE.  While there was some turbulence around the valve, the leaflets seemed to be moving well.  There was no obvious pannus formation.  Gradient on his transthoracic echo was within the moderate range at 33. 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 He was seen by Dr. Dusty who agreed  with continue watchul waiting.   Hasalso struggled with AF/palpitations.  Has seen Dr. Kelsie and had two ablations in 8/13 and 3/14. Now has Linq monitor in (placed 04/06/17). Seen in AF Clinic in 7/21 AF burden was low. Most recent device interrogation from 12/21 reviewed AF burden 13.9% with longest event 49 hr duration (overall AF burden over life of ILR ~5%).  CT chest 4/22: Stable dilation of ascending aorta 4.8 cm CT chest 5/24 AscAo 5.2cm new pulmonary nodule - Has seen Dr. Lucas. Continue f/u for now  Echo 10/22 EF 60% moderate AS mean gradient  Echo 08/26/22 EF 50-55% moderate AS mean gradient Asc ao 5.0    Underwent excision of large blood-filled pre-patellar bursa on his leg on 05/10/23.   Echo 07/05/23: EF 50-55% RV ok. AVR MG 27 mmHg, EOA 1.2 cm^2, DI 0.31   Echo 4/25 showed EF 55-60%, RV normal, mild MR, AoV mean gradient 33 mmHg, DVI 0.31-0.33  Today he returns for HF follow up, needs Pre-Op  eval for shoulder surgery on 05/11/24. Overall feeling like crap. Struggling with severe should pain. He is tired. AF comes and goes, feels bad when he is in AF. He has SOB walking further distances on flat ground. Can walk up a flight up steps but anything more he has dyspnea. He attributes these symptoms to being overweight. Occasional dizziness when standing. Denies palpitations, abnormal bleeding, CP, edema, or PND/Orthopnea. Appetite ok. Weight at home 253 pounds. Taking all medications. Does not use CPAP. BP at home 120/70s.   Cardiac studies: Echo 07/05/23: EF 50-55% RV ok. AVR MG 27 mmHg, EOA 1.2 cm^2, DI 0.31   Echo 08/26/22 EF 50-55% moderate AS mean gradient Asc ao 5.0    Echo 10/22 EF 60% moderate AS mean gradient   Echo 2/21: EF 60-65% Aortic valve mean gradient measures 29.0 mmHg. Aortic valve peak gradient  measures 57.8 mmHg. AVA 1.2 cm2 Personally reviewed  Echo 6/19: EF 55-60% AoV Mean gradient (S): 27 mm Hg  Echo 7/18  EF 55-60% AoV Mean gradient (S): 26 mm Hg. Peak gradient (S): 51 mm Hg. AVA 1.35 cm2  Echo 4/17: EF 60-65%  AoV Mean gradient (S): 34 mm Hg. Peak gradient (S): 63 mm Hg.  Echo 4/16: EF 55-60%, Aortic valve gradient 44 to 60 mm Hg, increased from 22 to 35 mm Hg.  Review of systems complete and found to  be negative unless listed in HPI.   Past Medical History:  Diagnosis Date   ADHD (attention deficit hyperactivity disorder)    Aortic stenosis    a. due to bicuspid AoV; 1983 S/p mechanical AVR (St. Jude) - chronic coumadin  with INR run between 3.5-4.5;  b. 03/2012 Echo: EF 60%, nl wall motion, mod dil LA.   Arthritis    Ascending aortic aneurysm (HCC)    Atrial tachycardia (HCC)    a. admitted 07/2012   CAD (coronary artery disease)    CHF (congestive heart failure) (HCC)    COVID janurary 2022   Dysrhythmia    ED (erectile dysfunction)    Gallstones    Headache(784.0)    Hepatitis A    a. as a child (from [employer])   History of  kidney stones    Hypertension    OSA (obstructive sleep apnea)    a. noncompliant with CPAP   Paroxysmal atrial fibrillation (HCC)    s/p afib ablation x 2   Pneumonia    Current Outpatient Medications  Medication Sig Dispense Refill   amiodarone  (PACERONE ) 200 MG tablet TAKE 1 TABLET BY MOUTH EVERY DAY 90 tablet 1   aspirin  EC 81 MG tablet Take 81 mg by mouth at bedtime.      carvedilol  (COREG ) 6.25 MG tablet TAKE 1 TABLET BY MOUTH TWICE DAILY 180 tablet 3   FARXIGA  10 MG TABS tablet TAKE 1 TABLET BY MOUTH DAILY BEFORE BREAKFAST. 30 tablet 11   furosemide  (LASIX ) 20 MG tablet TAKE 1 TABLET BY MOUTH EVERY DAY 90 tablet 1   ibuprofen  (ADVIL ) 200 MG tablet Take 600 mg by mouth as needed for moderate pain (pain score 4-6) or headache.     lisinopril  (ZESTRIL ) 40 MG tablet TAKE 1 TABLET BY MOUTH EVERY DAY 30 tablet 11   pantoprazole  (PROTONIX ) 40 MG tablet TAKE 1 TABLET BY MOUTH EVERY DAY 30 tablet 1   simvastatin  (ZOCOR ) 20 MG tablet TAKE 1 TABLET BY MOUTH EVERYDAY AT BEDTIME 90 tablet 1   spironolactone  (ALDACTONE ) 25 MG tablet TAKE 1 TABLET BY MOUTH EVERY DAY 90 tablet 1   warfarin (COUMADIN ) 10 MG tablet TAKE 1/2 TO 1 TABLET BY MOUTH DAILY AS DIRECTED BY COUMADIN  CLINIC 90 tablet 1   furosemide  (LASIX ) 20 MG tablet Take 1 tablet (20 mg total) by mouth daily. 90 tablet 3   No current facility-administered medications for this encounter.   Wt Readings from Last 3 Encounters:  04/10/24 116.8 kg (257 lb 6.4 oz)  03/29/24 114.3 kg (252 lb)  12/03/23 114.9 kg (253 lb 6.4 oz)   BP 124/76   Pulse 64   Ht 5' 10 (1.778 m)   Wt 116.8 kg (257 lb 6.4 oz)   SpO2 96%   BMI 36.93 kg/m   Physical Exam: General:  NAD. No resp difficulty, walked into clinic HEENT: Normal Neck: Supple. No JVD. Cor: Regular rate & rhythm. No rubs, gallops or murmurs, mechanical S2 Lungs: Clear Abdomen: Soft, obese, nontender, nondistended.  Extremities: No cyanosis, clubbing, rash, 1+ pre-tibial BLE  edema Neuro: Alert & oriented x 3, moves all 4 extremities w/o difficulty. Affect pleasant.  ReDs reading: 38 %, abnormal  ECG (personally reviewed from 03/29/24): NSR 60 bpm  Assessment & Plan: 1. Aortic stenosis  - s/p mechanical AVR - Echo 03/02/17 EF normal Mean AoV gradient 26.  - Echo 6/19 AVR stable mean gradient 27 - Echo 2/21: EF 60-65% Aortic valve mean gradient measures 29.0 mmHg. Aortic  valve peak gradient measures 57.8 mmHg. AVA 1.2 cm2  - Echo 10/22 EF 60% moderate AS mean gradient Personally reviewed - Echo 08/26/22: EF 50-55% moderate AS mean gradient Asc ao 5.0 Personally reviewed - Echo 07/05/23: EF 50-55% RV ok. AVR MG 27 mmHg, EOA 1.2 cm^2, DI 0.31 AoRoot 4.9 cm - AVR stable. Continue to follow with serial echos. Has seen Dr. Lucas recently - Warfarin per coumadin  clinic. No recent bleeding - Aware of SBE prophylaxis - If symptoms worsen, will need RHC  2. Chronic diastolic HF - Echo 4/25 showed EF 55-60%, RV normal - NYHA II-III, suspect shoulder pain and body habitus contributing. Volume up a bit, ReDS 38% - Increase Lasix  to 40 mg daily. (Will need to watch renal function closely, as he had AKI in the past.) - Continue Farxiga  10 mg daily - Continue spironolactone  25 mg daily - Continue lisinopril  40 mg daily. - If symptoms persist with need RHC + likely coronary angio - May need TEE to get better picture of AVR - Recent labs reviewed and are stable, repeat BMET at PCP follow up 04/26/24   3. HTN - Blood pressure well controlled.  - Continue current regimen.  4. Aortic root aneurysm - Followed by Dr. Bartle - CT scan 2/20 which measures 5.0 x 4.9 cm, previously 4.8 x 4.6 cm measured similarly on examinations previously - CT chest 4/22:  - CT chest 5/24 AscAo 5.2cm new pulmonary nodule  - Echo 07/05/23: EF 50-55% RV ok. AVR MG 27 mmHg, EOA 1.2 cm^2, DI 0.31  AoRoot 4.9 cm - Continue to follow closely  5. CKD 3b   - Last SCr 1.57 - Continue  Farxiga  - Following with Nephrology   6. Hyperlipidemia - Continue Zocor  20 mg daily - Followed by Dr. Mahlon .  7. Paroxysmal atrial fibrillation  - Followed by Dr. Cindie - Remains in NSR - Continue amiodarone  - Continue warfarin with mAVR - no bleeding   8. OSA - Unable to tolerate to CPAP - continue weight loss efforts  9. Obesity - Body mass index is 36.93 kg/m. - Refer to PharmD for GLP1 (he has OSA)  10. Preop - Scheduled for R rever total shoulder arthroplasty on 05/11/24 with Dr. Dozier - RCRI score = 1 pt, (1.1% risk of major cardiac event) - Acceptable risk to proceed from CV standpoint. - Coumadin  Clinic to give recs on [facility] hold  Follow up in 3 months with Dr. Bensimhon  Mark Persley M Lark Runk, FNP 1:29 PM

## 2024-04-07 NOTE — Telephone Encounter (Signed)
 Called to confirm/remind patient of their appointment at the [facility] Failure Clinic on 04/10/24.   Appointment:   [x] Confirmed  [] Left mess   [] No answer/No voice mail  [] VM Full/unable to leave message  [] Phone not in service  Patient reminded to bring all medications and/or complete list.  Confirmed patient has transportation. Gave directions, instructed to utilize valet parking.

## 2024-04-10 ENCOUNTER — Encounter (HOSPITAL_COMMUNITY): Payer: Self-pay

## 2024-04-10 ENCOUNTER — Ambulatory Visit (HOSPITAL_COMMUNITY)
Admission: RE | Admit: 2024-04-10 | Discharge: 2024-04-10 | Disposition: A | Discharge status: Home / Self Care | Source: Ambulatory Visit | Attending: Family Medicine | Admitting: Family Medicine

## 2024-04-10 VITALS — BP 124/76 | HR 64 | Ht 70.0 in | Wt 257.4 lb

## 2024-04-10 DIAGNOSIS — I350 Nonrheumatic aortic (valve) stenosis: Secondary | ICD-10-CM | POA: Diagnosis not present

## 2024-04-10 DIAGNOSIS — Z952 Presence of prosthetic heart valve: Secondary | ICD-10-CM | POA: Diagnosis not present

## 2024-04-10 DIAGNOSIS — E785 Hyperlipidemia, unspecified: Secondary | ICD-10-CM | POA: Diagnosis not present

## 2024-04-10 DIAGNOSIS — Q2543 Congenital aneurysm of aorta: Secondary | ICD-10-CM | POA: Diagnosis not present

## 2024-04-10 DIAGNOSIS — I10 Essential (primary) hypertension: Secondary | ICD-10-CM

## 2024-04-10 DIAGNOSIS — Z79899 Other long term (current) drug therapy: Secondary | ICD-10-CM | POA: Insufficient documentation

## 2024-04-10 DIAGNOSIS — I5032 Chronic diastolic (congestive) heart failure: Secondary | ICD-10-CM | POA: Diagnosis not present

## 2024-04-10 DIAGNOSIS — Z6836 Body mass index (BMI) 36.0-36.9, adult: Secondary | ICD-10-CM | POA: Insufficient documentation

## 2024-04-10 DIAGNOSIS — I4891 Unspecified atrial fibrillation: Secondary | ICD-10-CM | POA: Diagnosis not present

## 2024-04-10 DIAGNOSIS — I5022 Chronic systolic (congestive) heart failure: Secondary | ICD-10-CM | POA: Diagnosis not present

## 2024-04-10 DIAGNOSIS — G4733 Obstructive sleep apnea (adult) (pediatric): Secondary | ICD-10-CM

## 2024-04-10 DIAGNOSIS — I700 Atherosclerosis of aorta: Secondary | ICD-10-CM | POA: Insufficient documentation

## 2024-04-10 DIAGNOSIS — E663 Overweight: Secondary | ICD-10-CM | POA: Diagnosis not present

## 2024-04-10 DIAGNOSIS — I110 Hypertensive heart disease with heart failure: Secondary | ICD-10-CM | POA: Diagnosis not present

## 2024-04-10 DIAGNOSIS — I480 Paroxysmal atrial fibrillation: Secondary | ICD-10-CM | POA: Diagnosis not present

## 2024-04-10 DIAGNOSIS — Z91199 Patient's noncompliance with other medical treatment and regimen due to unspecified reason: Secondary | ICD-10-CM | POA: Diagnosis not present

## 2024-04-10 DIAGNOSIS — Z7984 Long term (current) use of oral hypoglycemic drugs: Secondary | ICD-10-CM | POA: Diagnosis not present

## 2024-04-10 DIAGNOSIS — N1832 Chronic kidney disease, stage 3b: Secondary | ICD-10-CM | POA: Insufficient documentation

## 2024-04-10 DIAGNOSIS — E669 Obesity, unspecified: Secondary | ICD-10-CM | POA: Diagnosis not present

## 2024-04-10 DIAGNOSIS — Z7901 Long term (current) use of anticoagulants: Secondary | ICD-10-CM | POA: Insufficient documentation

## 2024-04-10 DIAGNOSIS — N183 Chronic kidney disease, stage 3 unspecified: Secondary | ICD-10-CM

## 2024-04-10 DIAGNOSIS — Z01818 Encounter for other preprocedural examination: Secondary | ICD-10-CM

## 2024-04-10 MED ORDER — FUROSEMIDE 40 MG PO TABS: 40.0000 mg | ORAL_TABLET | Freq: Every day | ORAL | 3 refills | Status: AC

## 2024-04-10 NOTE — Progress Notes (Signed)
 ReDS Vest / Clip - 04/10/24 1325       ReDS Vest / Clip   Station Marker D    Ruler Value 36    ReDS Value Range Moderate volume overload    ReDS Actual Value 38

## 2024-04-10 NOTE — Patient Instructions (Addendum)
 Increase Lasix  to 40 mg daily - new Rx sent. Referral sent to PharmD Clinic for GLP (weight loss medication). Get labs at your PCP appointment on 04/26/24. Request added to your appointment note and order placed. Return to see Dr. Cherrie in 3 months. CALL [phone] IN OCTOBER TO SCHEDULE THIS APPOINTMENT.  Please call us  at [phone] if any questions or concerns prior to your next visit.

## 2024-04-14 ENCOUNTER — Telehealth: Payer: Self-pay

## 2024-04-14 NOTE — Telephone Encounter (Signed)
  Patient Consent for Virtual Visit        Mark [name] has provided verbal consent on 04/14/2024 for a virtual visit (video or telephone).   CONSENT FOR VIRTUAL VISIT FOR:  Mark [name]  By participating in this virtual visit I agree to the following:  I hereby voluntarily request, consent and authorize [facility] and its employed or contracted physicians, physician assistants, nurse practitioners or other licensed health care professionals (the Practitioner), to provide me with telemedicine health care services (the "Services) as deemed necessary by the treating Practitioner. I acknowledge and consent to receive the Services by the Practitioner via telemedicine. I understand that the telemedicine visit will involve communicating with the Practitioner through live audiovisual communication technology and the disclosure of certain medical information by electronic transmission. I acknowledge that I have been given the opportunity to request an in-person assessment or other available alternative prior to the telemedicine visit and am voluntarily participating in the telemedicine visit.  I understand that I have the right to withhold or withdraw my consent to the use of telemedicine in the course of my care at any time, without affecting my right to future care or treatment, and that the Practitioner or I may terminate the telemedicine visit at any time. I understand that I have the right to inspect all information obtained and/or recorded in the course of the telemedicine visit and may receive copies of available information for a reasonable fee.  I understand that some of the potential risks of receiving the Services via telemedicine include:  Delay or interruption in medical evaluation due to technological equipment failure or disruption; Information transmitted may not be sufficient (e.g. poor resolution of images) to allow for appropriate medical decision making by  the Practitioner; and/or  In rare instances, security protocols could fail, causing a breach of personal health information.  Furthermore, I acknowledge that it is my responsibility to provide information about my medical history, conditions and care that is complete and accurate to the best of my ability. I acknowledge that Practitioner's advice, recommendations, and/or decision may be based on factors not within their control, such as incomplete or inaccurate data provided by me or distortions of diagnostic images or specimens that may result from electronic transmissions. I understand that the practice of medicine is not an exact science and that Practitioner makes no warranties or guarantees regarding treatment outcomes. I acknowledge that a copy of this consent can be made available to me via my patient portal [facility] MyChart), or I can request a printed copy by calling the office of [facility].    I understand that my insurance will be billed for this visit.   I have read or had this consent read to me. I understand the contents of this consent, which adequately explains the benefits and risks of the Services being provided via telemedicine.  I have been provided ample opportunity to ask questions regarding this consent and the Services and have had my questions answered to my satisfaction. I give my informed consent for the services to be provided through the use of telemedicine in my medical care

## 2024-04-14 NOTE — Telephone Encounter (Signed)
 Patient with diagnosis of atrial fibrillation and mechanical AVR on warfarin for anticoagulation.    Procedure:  RIGHT REVERSE TOTAL SHOULDER ARTHROPLASTY   Date of Surgery:  Clearance TBD     CHA2DS2-VASc Score = 3   This indicates a 3.2% annual risk of stroke. The patient's score is based upon: CHF History: 1 HTN History: 1 Diabetes History: 0 Stroke History: 0 Vascular Disease History: 1 Age Score: 0 Gender Score: 0    CrCl 81 Platelet count 228  Patient has not had an Afib/aflutter ablation within the last 3 months or DCCV within the last 30 days  Per office protocol, patient can hold warfarin for 5 days prior to procedure.   Patient WILL  need bridging with Lovenox  (enoxaparin ) around procedure.  Coumadin  clinic aware of need for bridge, will arrange at next visit  **This guidance is not considered finalized until pre-operative APP has relayed final recommendations.**

## 2024-04-14 NOTE — Telephone Encounter (Signed)
   Name: Mark [name]  DOB: April 25, 1961  MRN: [id number]  Primary Cardiologist: OLE ONEIDA HOLTS, MD   Preoperative team, please contact this patient and set up a phone call appointment for further preoperative risk assessment. Please obtain consent and complete medication review. Thank you for your help.  I confirm that guidance regarding antiplatelet and oral anticoagulation therapy has been completed and, if necessary, noted below.  Patient has not had an Afib/aflutter ablation within the last 3 months or DCCV within the last 30 days   Per office protocol, patient can hold warfarin for 5 days prior to procedure.    I also confirmed the patient resides in the state of [place] . As per [facility] Medical Board telemedicine laws, the patient must reside in the state in which the provider is licensed.   Lamarr Satterfield, NP 04/14/2024, 9:12 AM [facility]

## 2024-04-14 NOTE — Telephone Encounter (Signed)
 Preop tele appt now scheduled, med rec and consent done.

## 2024-04-19 ENCOUNTER — Telehealth: Payer: Self-pay

## 2024-04-19 NOTE — Telephone Encounter (Signed)
 OV with pre op clearance faxed to [facility]

## 2024-04-19 NOTE — Telephone Encounter (Signed)
 2nd request received.  Per request received procedure date is 05/11/24 pending clearance.   Pt is scheduled for a TELE preop appt 04/20/24

## 2024-04-19 NOTE — Telephone Encounter (Signed)
 Type of form received: surgical clearance   Additional comments:   Received by: fax  Form should be Faxed/mailed to: [phone]  Is patient requesting call for pickup: No  Form placed:  In provider Sign folder POD A  Attach charge sheet.  Provider will determine charge.  Individual made aware of 3-5 business day turn around No?    Appt scheduled for 04/26/24 surgery set for 05/11/2024

## 2024-04-20 ENCOUNTER — Ambulatory Visit

## 2024-04-26 ENCOUNTER — Ambulatory Visit: Attending: Cardiology | Admitting: *Deleted

## 2024-04-26 ENCOUNTER — Ambulatory Visit: Admitting: Family Medicine

## 2024-04-26 ENCOUNTER — Encounter: Payer: Self-pay | Admitting: Family Medicine

## 2024-04-26 VITALS — BP 108/60 | HR 54 | Temp 98.0°F | Ht 70.0 in | Wt 253.1 lb

## 2024-04-26 DIAGNOSIS — Z952 Presence of prosthetic heart valve: Secondary | ICD-10-CM | POA: Insufficient documentation

## 2024-04-26 DIAGNOSIS — I4891 Unspecified atrial fibrillation: Secondary | ICD-10-CM | POA: Diagnosis not present

## 2024-04-26 DIAGNOSIS — Z7901 Long term (current) use of anticoagulants: Secondary | ICD-10-CM | POA: Diagnosis not present

## 2024-04-26 DIAGNOSIS — Z01818 Encounter for other preprocedural examination: Secondary | ICD-10-CM | POA: Diagnosis not present

## 2024-04-26 LAB — HEPATIC FUNCTION PANEL
ALT: 25 U/L (ref 0–53)
AST: 23 U/L (ref 0–37)
Albumin: 4.3 g/dL (ref 3.5–5.2)
Alkaline Phosphatase: 68 U/L (ref 39–117)
Bilirubin, Direct: 0.1 mg/dL (ref 0.0–0.3)
Total Bilirubin: 0.8 mg/dL (ref 0.2–1.2)
Total Protein: 7.4 g/dL (ref 6.0–8.3)

## 2024-04-26 LAB — BASIC METABOLIC PANEL WITH GFR
BUN: 28 mg/dL — ABNORMAL HIGH (ref 6–23)
CO2: 25 meq/L (ref 19–32)
Calcium: 9 mg/dL (ref 8.4–10.5)
Chloride: 101 meq/L (ref 96–112)
Creatinine, Ser: 1.61 mg/dL — ABNORMAL HIGH (ref 0.40–1.50)
GFR: 46.08 mL/min — ABNORMAL LOW (ref >60.00)
Glucose, Bld: 111 mg/dL — ABNORMAL HIGH (ref 70–99)
Potassium: 4 meq/L (ref 3.5–5.1)
Sodium: 135 meq/L (ref 135–145)

## 2024-04-26 LAB — POCT INR: INR: 3.2 — AB (ref 2.0–3.0)

## 2024-04-26 MED ORDER — PANTOPRAZOLE SODIUM 40 MG PO TBEC: 40.0000 mg | DELAYED_RELEASE_TABLET | Freq: Every day | ORAL | 1 refills | Status: DC

## 2024-04-26 MED ORDER — ENOXAPARIN SODIUM 120 MG/0.8ML IJ SOSY: 120.0000 mg | PREFILLED_SYRINGE | Freq: Two times a day (BID) | INTRAMUSCULAR | 1 refills | Status: DC

## 2024-04-26 NOTE — Progress Notes (Signed)
 Subjective:    Mark [name] is a 63 y.o. male who presents to the office today for a preoperative consultation at the request of surgeon Dr Dozier who plans on performing reverse total shoulder on September 11. This consultation is requested for the specific conditions prompting preoperative evaluation (i.e. because of potential affect on operative risk): HTN, Afib, CHF, anti-coagulation, obesity. Planned anesthesia: TBD. The patient has the following known anesthesia issues: no hx of complications. Patients bleeding risk: no recent abnormal bleeding, no remote history of abnormal bleeding, and is working w/ Coumadin  clinic to safely transition anticoagulation. Patient does not have objections to receiving blood products if needed.  The following portions of the patient's history were reviewed and updated as appropriate: allergies, current medications, past family history, past medical history, past social history, past surgical history, and problem list.  Review of Systems A comprehensive review of systems was negative.    Objective:    BP 108/60   Pulse (!) 54   Temp 98 F (36.7 C)   Ht 5' 10 (1.778 m)   Wt 253 lb 2 oz (114.8 kg)   SpO2 98%   BMI 36.32 kg/m   General Appearance:    Alert, cooperative, no distress, appears stated age, obese  Head:    Normocephalic, without obvious abnormality, atraumatic  Eyes:    PERRL, conjunctiva/corneas clear, EOM's intact both eyes       Ears:    Normal TM's and external ear canals, both ears  Nose:   Nares normal, septum midline, mucosa normal, no drainage    or sinus tenderness  Throat:   Lips, mucosa, and tongue normal; teeth and gums normal  Neck:   Supple, symmetrical, trachea midline, no adenopathy;       thyroid :  No enlargement/tenderness/nodules  Back:     Symmetric, no curvature, ROM normal, no CVA tenderness  Lungs:     Clear to auscultation bilaterally, respirations unlabored  Chest wall:    No tenderness or deformity   Heart:    Regular rate and rhythm, II-III/VI SEM w/ valvular click  Abdomen:     Soft, non-tender, bowel sounds active all four quadrants,    no masses, no organomegaly  Genitalia:    deferred  Rectal:    Extremities:   Extremities normal, atraumatic, no cyanosis or edema  Pulses:   2+ and symmetric all extremities  Skin:   Skin color, texture, turgor normal, no rashes or lesions  Lymph nodes:   Cervical, supraclavicular, and axillary nodes normal  Neurologic:   CNII-XII intact. Normal strength, sensation and reflexes      throughout    Predictors of intubation difficulty:  Morbid obesity? no  Anatomically abnormal facies? no  Prominent incisors? no  Receding mandible? no  Short, thick neck? no  Neck range of motion: normal  Dentition: no chipped or broken teeth, 3 missing teeth on R side  Cardiographics ECG: done by Cardiology Echocardiogram: per cardiology  Imaging Chest x-ray: NA   Lab Review  Anti-coag visit on 04/26/2024  Component Date Value   INR 04/26/2024 3.2 (A)   Anti-coag visit on 04/05/2024  Component Date Value   INR 04/05/2024 3.6 (A)   Orders Only on 03/29/2024  Component Date Value   Free T4 03/29/2024 1.51    TSH 03/29/2024 1.940    Glucose 03/29/2024 94    BUN 03/29/2024 28 (H)    Creatinine, Ser 03/29/2024 1.57 (H)    eGFR 03/29/2024 50 (L)  BUN/Creatinine Ratio 03/29/2024 18    Sodium 03/29/2024 136    Potassium 03/29/2024 4.5    Chloride 03/29/2024 98    CO2 03/29/2024 20    Calcium  03/29/2024 9.8    Total Protein 03/29/2024 7.2    Albumin  03/29/2024 4.8    Globulin, Total 03/29/2024 2.4    Bilirubin Total 03/29/2024 0.7    Alkaline Phosphatase 03/29/2024 77    AST 03/29/2024 21    ALT 03/29/2024 21   Clinical Support on 03/27/2024  Component Date Value   Date Time Interrogation * 03/26/2024 [id number]    Pulse Generator Manufact* 03/26/2024 MERM    Pulse Gen Model 03/26/2024 [id number] [id number] II    Pulse Gen Serial Number  03/26/2024 [id number]    Clinic Name 03/26/2024 [facility]    Implantable Pulse Genera* 03/26/2024 ICM/ILR    Implantable Pulse Genera* 03/26/2024 [id number]   Anti-coag visit on 03/22/2024  Component Date Value   INR 03/22/2024 2.8       Assessment:      63 y.o. male with planned surgery as above.   Known risk factors for perioperative complications: Congestive heart failure   Difficulty with intubation is not anticipated.  Cardiac Risk Estimation: moderate  Current medications which may produce withdrawal symptoms if withheld perioperatively: none    Plan:    1. Preoperative workup as follows per Cardiology. 2. Change in medication regimen before surgery: discontinue ASA 14 days before surgery, discontinue NSAIDs (Ibuprofen ) 14 days before surgery, and warfarin management: per coumadin  clinic. 3. Prophylaxis for cardiac events with perioperative beta-blockers: should be considered, specific regimen per anesthesia. 4. Invasive hemodynamic monitoring perioperatively: at the discretion of anesthesiologist. 5. Deep vein thrombosis prophylaxis postoperatively:regimen to be chosen by surgical team. 6. Surveillance for postoperative MI with ECG immediately postoperatively and on postoperative days 1 and 2 AND troponin levels 24 hours postoperatively and on day 4 or hospital discharge (whichever comes first): at the discretion of anesthesiologist. 7. Other measures: NA

## 2024-04-26 NOTE — Progress Notes (Signed)
 Description   INR-3.2; Today take 1.5 tablets then continue warfarin 1/2 tablet daily except 1 tablet on Sunday and Wednesday. Follow patient instructions for upcoming procedure Recheck INR in 1 week post procedure.  Amio 200mg  daily.  Coumadin  Clinic [phone] Cardiac clearance form Fax to [phone]

## 2024-04-26 NOTE — Patient Instructions (Signed)
 Follow up as needed or as scheduled We'll notify you of your lab results and make any changes if needed Call with any questions or concerns Stay Safe! Stay Healthy! GOOD LUCK WITH SURGERY!!!

## 2024-04-26 NOTE — Patient Instructions (Addendum)
 Description   INR-3.2; Today take 1.5 tablets then continue warfarin 1/2 tablet daily except 1 tablet on Sunday and Wednesday. Follow patient instructions for upcoming procedure Recheck INR in 1 week post procedure.  Amio 200mg  daily.  Coumadin  Clinic [phone] Cardiac clearance form Fax to [phone]     05/05/24: Last dose of warfarin.  05/06/24: No warfarin or enoxaparin  (Lovenox ).  05/07/24: Inject enoxaparin120mg  in the fatty abdominal tissue at least 2 inches from the belly button twice a day about 12 hours apart, 8am and 8pm rotate sites. No warfarin.  05/08/24: Inject enoxaparin  in the fatty tissue every 12 hours, 8am and 8pm. No warfarin.  05/09/24: Inject enoxaparin  in the fatty tissue every 12 hours, 8am and 8pm. No warfarin.  05/10/24: Inject enoxaparin  in the fatty tissue in the morning at 8 am (No PM dose). No warfarin.  05/11/24: Procedure Day - No enoxaparin  - Resume warfarin in the evening or as directed by doctor (take an extra half tablet with usual dose for 2 days then resume normal dose).  05/12/24: Resume enoxaparin  inject in the fatty tissue every 12 hours and take warfarin  05/13/24: Inject enoxaparin  in the fatty tissue every 12 hours and take warfarin  05/14/24: Inject enoxaparin  in the fatty tissue every 12 hours and take warfarin  05/15/24: Inject enoxaparin  in the fatty tissue every 12 hours and take warfarin  05/16/24: Inject enoxaparin  in the fatty tissue every 12 hours and take warfarin  05/17/24: Inject enoxaparin  in the fatty tissue every 12 hours and take warfarin  05/18/24: Inject enoxaparin  in the fatty tissue and warfarin appt to check INR.

## 2024-04-27 ENCOUNTER — Ambulatory Visit (INDEPENDENT_AMBULATORY_CARE_PROVIDER_SITE_OTHER): Payer: Self-pay

## 2024-04-27 ENCOUNTER — Ambulatory Visit: Payer: Self-pay | Admitting: Family Medicine

## 2024-04-27 DIAGNOSIS — I4891 Unspecified atrial fibrillation: Secondary | ICD-10-CM | POA: Diagnosis not present

## 2024-04-27 LAB — CUP PACEART REMOTE DEVICE CHECK
Date Time Interrogation Session: 20250827231317
Implantable Pulse Generator Implant Date: 20221031

## 2024-04-27 NOTE — Progress Notes (Signed)
 Pt has been notified.

## 2024-04-27 NOTE — Telephone Encounter (Signed)
 Form completed and returned to [place] to fax

## 2024-04-27 NOTE — Telephone Encounter (Signed)
 Faxed and placed in scan bin

## 2024-04-28 ENCOUNTER — Ambulatory Visit: Payer: Self-pay | Admitting: Cardiology

## 2024-05-02 NOTE — Patient Instructions (Addendum)
 SURGICAL WAITING ROOM VISITATION  Patients having surgery or a procedure may have no more than 2 support people in the waiting area - these visitors may rotate.    Children under the age of 81 must have an adult with them who is not the patient.  Visitors with respiratory illnesses are discouraged from visiting and should remain at home.  If the patient needs to stay at the hospital during part of their recovery, the visitor guidelines for inpatient rooms apply. Pre-op nurse will coordinate an appropriate time for 1 support person to accompany patient in pre-op.  This support person may not rotate.    Please refer to the [facility] website for the visitor guidelines for Inpatients (after your surgery is over and you are in a regular room).       Your procedure is scheduled on: 05-11-24   Report to [facility] Main Entrance    Report to admitting at        0515 AM   Call this number if you have problems the morning of surgery [phone]   Do not eat food :After Midnight.   After Midnight you may have the following liquids until _0430 _____ AM/ DAY OF SURGERY    then nothing by mouth  Water Non-Citrus Juices (without pulp, NO RED-Apple, White grape, White cranberry) Black Coffee (NO MILK/CREAM OR CREAMERS, sugar ok)  Clear Tea (NO MILK/CREAM OR CREAMERS, sugar ok) regular and decaf                             Plain Jell-O (NO RED)                                           Fruit ices (not with fruit pulp, NO RED)                                     Popsicles (NO RED)                                                               Sports drinks like Gatorade (NO RED)                  The day of surgery:  Drink ONE (1) Pre-Surgery Clear Ensure BY 0430  AM the morning of surgery. Drink in one sitting. Do not sip.  This drink was given to you during your hospital  pre-op appointment visit. Nothing else to drink after completing the  Pre-Surgery Clear Ensure .          If  you have questions, please contact your surgeon's office.   FOLLOW  ANY ADDITIONAL PRE OP INSTRUCTIONS YOU RECEIVED FROM YOUR SURGEON'S OFFICE!!!     Oral Hygiene is also important to reduce your risk of infection.                                    Remember - BRUSH YOUR TEETH THE MORNING OF SURGERY  WITH YOUR REGULAR TOOTHPASTE  DENTURES WILL BE REMOVED PRIOR TO SURGERY PLEASE DO NOT APPLY Poly grip OR ADHESIVES!!!   Do NOT smoke after Midnight   Stop all vitamins and herbal supplements 7 days before surgery.   Take these medicines the morning of surgery with A SIP OF WATER: pantoprazole , loratadine, amiodarone , tylenol  if needed               Farxiga  hold 72 hours prior Last dose 05-07-24             Coumadin  follow prescriber instructions    Bring CPAP mask and tubing day of surgery.                              You may not have any metal on your body including hair pins, jewelry, and body piercing             Do not wear  lotions, powders, perfumes/cologne, or deodorant               Men may shave face and neck.   Do not bring valuables to the hospital. [facility] IS NOT             RESPONSIBLE   FOR VALUABLES.   Contacts, glasses, dentures or bridgework may not be worn into surgery.   Bring small overnight bag day of surgery.   DO NOT BRING YOUR HOME MEDICATIONS TO THE HOSPITAL. PHARMACY WILL DISPENSE MEDICATIONS LISTED ON YOUR MEDICATION LIST TO YOU DURING YOUR ADMISSION IN THE HOSPITAL!    Patients discharged on the day of surgery will not be allowed to drive home.  Someone NEEDS to stay with you for the first 24 hours after anesthesia.   Special Instructions: Bring a copy of your healthcare power of attorney and living will documents the day of surgery if you haven't scanned them before.              Please read over the following fact sheets you were given: IF YOU HAVE QUESTIONS ABOUT YOUR PRE-OP INSTRUCTIONS PLEASE CALL [phone].    If you test positive for  Covid or have been in contact with anyone that has tested positive in the last 10 days please notify you surgeon.   [facility]- Preparing for Total Shoulder Arthroplasty    Before surgery, you can play an important role. Because skin is not sterile, your skin needs to be as free of germs as possible. You can reduce the number of germs on your skin by using the following products. Benzoyl Peroxide Gel Reduces the number of germs present on the skin Applied twice a day to shoulder area starting two days before surgery    ==================================================================  Please follow these instructions carefully:  BENZOYL PEROXIDE 5% GEL  Please do not use if you have an allergy to benzoyl peroxide.   If your skin becomes reddened/irritated stop using the benzoyl peroxide.  Starting two days before surgery, apply as follows: Apply benzoyl peroxide in the morning and at night. Apply after taking a shower. If you are not taking a shower clean entire shoulder front, back, and side along with the armpit with a clean wet washcloth.  Place a quarter-sized dollop on your shoulder and rub in thoroughly, making sure to cover the front, back, and side of your shoulder, along with the armpit.   2 days before ____ AM   ____ PM  1 day before ____ AM   ____ PM                         Do this twice a day for two days.  (Last application is the night before surgery, AFTER using the CHG soap as described below).  Do NOT apply benzoyl peroxide gel on the day of surgery.      Pre-operative 5 CHG Bath Instructions   You can play a key role in reducing the risk of infection after surgery. Your skin needs to be as free of germs as possible. You can reduce the number of germs on your skin by washing with CHG (chlorhexidine  gluconate) soap before surgery. CHG is an antiseptic soap that kills germs and continues to kill germs even after washing.   DO NOT use if you have an  allergy to chlorhexidine /CHG or antibacterial soaps. If your skin becomes reddened or irritated, stop using the CHG and notify one of our RNs at [phone].   Please shower with the CHG soap starting 4 days before surgery using the following schedule:     Please keep in mind the following:  DO NOT shave, including legs and underarms, starting the day of your first shower.   You may shave your face at any point before/day of surgery.  Place clean sheets on your bed the day you start using CHG soap. Use a clean washcloth (not used since being washed) for each shower. DO NOT sleep with pets once you start using the CHG.   CHG Shower Instructions:  If you choose to wash your hair and private area, wash first with your normal shampoo/soap.  After you use shampoo/soap, rinse your hair and body thoroughly to remove shampoo/soap residue.  Turn the water OFF and apply about 3 tablespoons (45 ml) of CHG soap to a CLEAN washcloth.  Apply CHG soap ONLY FROM YOUR NECK DOWN TO YOUR TOES (washing for 3-5 minutes)  DO NOT use CHG soap on face, private areas, open wounds, or sores.  Pay special attention to the area where your surgery is being performed.  If you are having back surgery, having someone wash your back for you may be helpful. Wait 2 minutes after CHG soap is applied, then you may rinse off the CHG soap.  Pat dry with a clean towel  Put on clean clothes/pajamas   If you choose to wear lotion, please use ONLY the CHG-compatible lotions on the back of this paper.     Additional instructions for the day of surgery: DO NOT APPLY any lotions, deodorants, cologne, or perfumes.   Put on clean/comfortable clothes.  Brush your teeth.  Ask your nurse before applying any prescription medications to the skin.      CHG Compatible Lotions   Aveeno Moisturizing lotion  Cetaphil Moisturizing Cream  Cetaphil Moisturizing Lotion  Clairol Herbal Essence Moisturizing Lotion, Dry Skin  Clairol  Herbal Essence Moisturizing Lotion, Extra Dry Skin  Clairol Herbal Essence Moisturizing Lotion, Normal Skin  Curel Age Defying Therapeutic Moisturizing Lotion with Alpha Hydroxy  Curel Extreme Care Body Lotion  Curel Soothing Hands Moisturizing Hand Lotion  Curel Therapeutic Moisturizing Cream, Fragrance-Free  Curel Therapeutic Moisturizing Lotion, Fragrance-Free  Curel Therapeutic Moisturizing Lotion, Original Formula  Eucerin Daily Replenishing Lotion  Eucerin Dry Skin Therapy Plus Alpha Hydroxy Crme  Eucerin Dry Skin Therapy Plus Alpha Hydroxy Lotion  Eucerin Original Crme  Eucerin Original Lotion  Eucerin Plus Crme Eucerin Plus  Lotion  Eucerin TriLipid Replenishing Lotion  Keri Anti-Bacterial Hand Lotion  Keri Deep Conditioning Original Lotion Dry Skin Formula Softly Scented  Keri Deep Conditioning Original Lotion, Fragrance Free Sensitive Skin Formula  Keri Lotion Fast Absorbing Fragrance Free Sensitive Skin Formula  Keri Lotion Fast Absorbing Softly Scented Dry Skin Formula  Keri Original Lotion  Keri Skin Renewal Lotion Keri Silky Smooth Lotion  Keri Silky Smooth Sensitive Skin Lotion  Nivea Body Creamy Conditioning Oil  Nivea Body Extra Enriched Lotion  Nivea Body Original Lotion  Nivea Body Sheer Moisturizing Lotion Nivea Crme  Nivea Skin Firming Lotion  NutraDerm 30 Skin Lotion  NutraDerm Skin Lotion  NutraDerm Therapeutic Skin Cream  NutraDerm Therapeutic Skin Lotion  ProShield Protective Hand Cream   Incentive Spirometer  An incentive spirometer is a tool that can help keep your lungs clear and active. This tool measures how well you are filling your lungs with each breath. Taking long deep breaths may help reverse or decrease the chance of developing breathing (pulmonary) problems (especially infection) following: A long period of time when you are unable to move or be active. BEFORE THE PROCEDURE  If the spirometer includes an indicator to show your best  effort, your nurse or respiratory therapist will set it to a desired goal. If possible, sit up straight or lean slightly forward. Try not to slouch. Hold the incentive spirometer in an upright position. INSTRUCTIONS FOR USE  Sit on the edge of your bed if possible, or sit up as far as you can in bed or on a chair. Hold the incentive spirometer in an upright position. Breathe out normally. Place the mouthpiece in your mouth and seal your lips tightly around it. Breathe in slowly and as deeply as possible, raising the piston or the ball toward the top of the column. Hold your breath for 3-5 seconds or for as long as possible. Allow the piston or ball to fall to the bottom of the column. Remove the mouthpiece from your mouth and breathe out normally. Rest for a few seconds and repeat Steps 1 through 7 at least 10 times every 1-2 hours when you are awake. Take your time and take a few normal breaths between deep breaths. The spirometer may include an indicator to show your best effort. Use the indicator as a goal to work toward during each repetition. After each set of 10 deep breaths, practice coughing to be sure your lungs are clear. If you have an incision (the cut made at the time of surgery), support your incision when coughing by placing a pillow or rolled up towels firmly against it. Once you are able to get out of bed, walk around indoors and cough well. You may stop using the incentive spirometer when instructed by your caregiver.  RISKS AND COMPLICATIONS Take your time so you do not get dizzy or light-headed. If you are in pain, you may need to take or ask for pain medication before doing incentive spirometry. It is harder to take a deep breath if you are having pain. AFTER USE Rest and breathe slowly and easily. It can be helpful to keep track of a log of your progress. Your caregiver can provide you with a simple table to help with this. If you are using the spirometer at home, follow  these instructions: SEEK MEDICAL CARE IF:  You are having difficultly using the spirometer. You have trouble using the spirometer as often as instructed. Your pain medication is not giving enough relief  while using the spirometer. You develop fever of 100.5 F (38.1 C) or higher. SEEK IMMEDIATE MEDICAL CARE IF:  You cough up bloody sputum that had not been present before. You develop fever of 102 F (38.9 C) or greater. You develop worsening pain at or near the incision site. MAKE SURE YOU:  Understand these instructions. Will watch your condition. Will get help right away if you are not doing well or get worse. Document Released: 12/28/2006 Document Revised: 11/09/2011 Document Reviewed: 02/28/2007 [facility] Patient Information 2014 [place], [place].   ________________________________________________________________________

## 2024-05-02 NOTE — Progress Notes (Addendum)
 PCP - Comer Greet, MD [name] 04-26-24  preop visit Cardiologist - Dr. Cherrie  Preop clearance Mark Gainer, FNP  04-10-24 epic Ole Holts LOV 03-29-24 epic  PPM/ICD -  Device Orders -  Rep Notified -   Chest x-ray - 10-21-23 epic EKG - 03-29-24 epic Stress Test -  ECHO - 12-20-23 epic Cardiac Cath -   Sleep Study -  CPAP -   Fasting Blood Sugar -  Checks Blood Sugar _____ times a day  Blood Thinner Instructions:  Coumadin  last dose 05-05-24  lovenox  bridge start 05-07-24   Aspirin  Instructions:ASA  hold 14 days  ERAS Protcol - PRE-SURGERY Ensure or G2-    COVID vaccine -  Activity-- Anesthesia review: CHF, HTN,A-fib, loop recorder , S/p AVR, OSA  Patient denies shortness of breath, fever, cough and chest pain at PAT appointment   All instructions explained to the patient, with a verbal understanding of the material. Patient agrees to go over the instructions while at home for a better understanding. Patient also instructed to self quarantine after being tested for COVID-19. The opportunity to ask questions was provided.

## 2024-05-03 ENCOUNTER — Encounter (HOSPITAL_COMMUNITY): Payer: Self-pay

## 2024-05-03 ENCOUNTER — Encounter (HOSPITAL_COMMUNITY)
Admission: RE | Admit: 2024-05-03 | Discharge: 2024-05-03 | Disposition: A | Discharge status: Home / Self Care | Source: Ambulatory Visit | Attending: Orthopedic Surgery | Admitting: Orthopedic Surgery

## 2024-05-03 ENCOUNTER — Other Ambulatory Visit: Payer: Self-pay

## 2024-05-03 VITALS — BP 111/79 | HR 50 | Temp 98.1°F | Resp 16 | Ht 70.0 in | Wt 251.4 lb

## 2024-05-03 DIAGNOSIS — I350 Nonrheumatic aortic (valve) stenosis: Secondary | ICD-10-CM | POA: Diagnosis not present

## 2024-05-03 DIAGNOSIS — I480 Paroxysmal atrial fibrillation: Secondary | ICD-10-CM | POA: Insufficient documentation

## 2024-05-03 DIAGNOSIS — Z01812 Encounter for preprocedural laboratory examination: Secondary | ICD-10-CM | POA: Diagnosis not present

## 2024-05-03 DIAGNOSIS — Z952 Presence of prosthetic heart valve: Secondary | ICD-10-CM | POA: Diagnosis not present

## 2024-05-03 DIAGNOSIS — G4733 Obstructive sleep apnea (adult) (pediatric): Secondary | ICD-10-CM | POA: Diagnosis not present

## 2024-05-03 DIAGNOSIS — I10 Essential (primary) hypertension: Secondary | ICD-10-CM

## 2024-05-03 DIAGNOSIS — M75101 Unspecified rotator cuff tear or rupture of right shoulder, not specified as traumatic: Secondary | ICD-10-CM | POA: Diagnosis not present

## 2024-05-03 DIAGNOSIS — I130 Hypertensive heart and chronic kidney disease with heart failure and stage 1 through stage 4 chronic kidney disease, or unspecified chronic kidney disease: Secondary | ICD-10-CM | POA: Diagnosis not present

## 2024-05-03 DIAGNOSIS — M19011 Primary osteoarthritis, right shoulder: Secondary | ICD-10-CM | POA: Insufficient documentation

## 2024-05-03 DIAGNOSIS — I7121 Aneurysm of the ascending aorta, without rupture: Secondary | ICD-10-CM | POA: Diagnosis not present

## 2024-05-03 DIAGNOSIS — Z7901 Long term (current) use of anticoagulants: Secondary | ICD-10-CM | POA: Insufficient documentation

## 2024-05-03 DIAGNOSIS — I5030 Unspecified diastolic (congestive) heart failure: Secondary | ICD-10-CM | POA: Diagnosis not present

## 2024-05-03 DIAGNOSIS — K219 Gastro-esophageal reflux disease without esophagitis: Secondary | ICD-10-CM | POA: Diagnosis not present

## 2024-05-03 DIAGNOSIS — K449 Diaphragmatic hernia without obstruction or gangrene: Secondary | ICD-10-CM | POA: Insufficient documentation

## 2024-05-03 DIAGNOSIS — N183 Chronic kidney disease, stage 3 unspecified: Secondary | ICD-10-CM | POA: Insufficient documentation

## 2024-05-03 DIAGNOSIS — Z01818 Encounter for other preprocedural examination: Secondary | ICD-10-CM

## 2024-05-03 HISTORY — DX: Gastro-esophageal reflux disease without esophagitis: K21.9

## 2024-05-03 HISTORY — DX: Cardiac murmur, unspecified: R01.1

## 2024-05-03 HISTORY — DX: Personal history of other diseases of the digestive system: Z87.19

## 2024-05-03 LAB — BASIC METABOLIC PANEL WITH GFR
Anion gap: 12 (ref 5–15)
BUN: 25 mg/dL — ABNORMAL HIGH (ref 6–20)
CO2: 22 mmol/L (ref 22–32)
Calcium: 9.4 mg/dL (ref 8.9–10.3)
Chloride: 104 mmol/L (ref 98–111)
Creatinine, Ser: 1.32 mg/dL — ABNORMAL HIGH (ref 0.61–1.24)
GFR, Estimated: >60 mL/min (ref >60)
Glucose, Bld: 101 mg/dL — ABNORMAL HIGH (ref 70–99)
Potassium: 4.3 mmol/L (ref 3.5–5.1)
Sodium: 138 mmol/L (ref 135–145)

## 2024-05-03 LAB — CBC
HCT: 42.3 % (ref 39.0–52.0)
Hemoglobin: 13.2 g/dL (ref 13.0–17.0)
MCH: 29.7 pg (ref 26.0–34.0)
MCHC: 31.2 g/dL (ref 30.0–36.0)
MCV: 95.1 fL (ref 80.0–100.0)
Platelets: 183 K/uL (ref 150–400)
RBC: 4.45 MIL/uL (ref 4.22–5.81)
RDW: 13.1 % (ref 11.5–15.5)
WBC: 6.5 K/uL (ref 4.0–10.5)
nRBC: 0 % (ref 0.0–0.2)

## 2024-05-03 LAB — SURGICAL PCR SCREEN
MRSA, PCR: NEGATIVE
Staphylococcus aureus: POSITIVE — AB

## 2024-05-03 NOTE — Telephone Encounter (Signed)
 3rd request received.  Pt appt 04/20/24 was canceled. Reasoning: Provider (MSW said pt does not need appt)   Reaching out to Rosaline Bane, NP and Josefa Beauvais, NP about next steps.   Per Rosaline Bane, NP the appt was canceled because: preop clearance addressed in OV note 8/11 with HF clinic NP. note faxed to [facility]   I will refax the note from 04/10/24 to the surgeons office.

## 2024-05-04 NOTE — Anesthesia Preprocedure Evaluation (Addendum)
 Anesthesia Evaluation  Patient identified by MRN, date of birth, ID band Patient awake    Reviewed: Allergy & Precautions, H&P , NPO status , Patient's Chart, lab work & pertinent test results, reviewed documented beta blocker date and time   History of Anesthesia Complications (+) PONV and history of anesthetic complications  Airway Mallampati: III  TM Distance: >3 FB Neck ROM: Full    Dental  (+) Teeth Intact, Dental Advisory Given   Pulmonary sleep apnea (unable to tolerate CPAP) , pneumonia   Pulmonary exam normal breath sounds clear to auscultation       Cardiovascular hypertension, Pt. on medications and Pt. on home beta blockers + CAD, + Peripheral Vascular Disease and +CHF  + dysrhythmias (coumadin  LD 9/3, bridged w/ lovenox  LD 24h) Atrial Fibrillation + Valvular Problems/Murmurs (s/p AVR 1983, moderate AS on last echo) AS  Rhythm:Regular Rate:Normal  Echo 11/2023  1. Left ventricular ejection fraction, by estimation, is 55 to 60%. The  left ventricle has normal function. The left ventricle has no regional  wall motion abnormalities. Left ventricular diastolic parameters are  indeterminate.   2. Right ventricular systolic function is normal. The right ventricular  size is normal.   3. The mitral valve is normal in structure. Mild mitral valve  regurgitation.   4. S/p AVR ([employer] Jude mechanical prosthesis unknown size; procedure date  1983) Peak and mean gradients through the valve are 58 and 33 mm Hg  respectively Dimensionless index is 0.33 Compared to previous echoes, mean  gradient is mildly increased (27, 30 to 33 mmHg). DVI is relatively unchanged from previous echo (0.31 to 0.33) Acceleration time less than 100 msec, making pathologic obstruction less likely.   5. Compared to echo from Nov 2024, aorta is mildly increased in size (49  to 51 mm Hg). 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   6. The inferior vena  cava is normal in size with greater than 50%  respiratory variability, suggesting right atrial pressure of 3 mmHg.     Echo 07/2022  1. Left ventricular ejection fraction, by estimation, is 50 to 55%. The  left ventricle has low normal function. The left ventricle has no regional  wall motion abnormalities. There is moderate concentric left ventricular  hypertrophy. Left ventricular  diastolic parameters are consistent with Grade I diastolic dysfunction  (impaired relaxation).   2. Right ventricular systolic function is mildly reduced. The right  ventricular size is normal.   3. Left atrial size was mildly dilated.   4. Right atrial size was mildly dilated.   5. The mitral valve is normal in structure. No evidence of mitral valve  regurgitation. No evidence of mitral stenosis.   6. The aortic valve is normal in structure. Aortic valve regurgitation is  not visualized. Moderate aortic valve stenosis. Aortic valve area, by VTI  measures 0.77 cm. Aortic valve mean gradient measures 20.0 mmHg. Aortic  valve Vmax measures 3.02 m/s.   7. Aortic dilatation noted. There is severe dilatation of the ascending  aorta, measuring 50 mm.   8. The inferior vena cava is normal in size with greater than 50%  respiratory variability, suggesting right atrial pressure of 3 mmHg.      Neuro/Psych  Headaches  negative psych ROS   GI/Hepatic hiatal hernia,GERD  Medicated and Controlled,,(+) Hepatitis -, A  Endo/Other  negative endocrine ROS    Renal/GU negative Renal ROS  negative genitourinary   Musculoskeletal  (+) Arthritis , Osteoarthritis,  Abdominal   Peds negative pediatric ROS (+)  Hematology negative hematology ROS (+)   Anesthesia Other Findings   Reproductive/Obstetrics negative OB ROS                              Anesthesia Physical Anesthesia Plan  ASA: 3  Anesthesia Plan: General   Post-op Pain Management: Regional block* and Ofirmev  IV  (intra-op)*   Induction:   PONV Risk Score and Plan: 3 and Treatment may vary due to age or medical condition, Ondansetron , Dexamethasone  and Midazolam   Airway Management Planned: Oral ETT  Additional Equipment: None  Intra-op Plan:   Post-operative Plan: Extubation in OR  Informed Consent: I have reviewed the patients History and Physical, chart, labs and discussed the procedure including the risks, benefits and alternatives for the proposed anesthesia with the patient or authorized representative who has indicated his/her understanding and acceptance.     Dental advisory given  Plan Discussed with: CRNA  Anesthesia Plan Comments: (See PAT note from 9/3 )        Anesthesia Quick Evaluation
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
## 2024-05-07 ENCOUNTER — Other Ambulatory Visit (HOSPITAL_COMMUNITY): Payer: Self-pay | Admitting: Internal Medicine

## 2024-05-07 DIAGNOSIS — I5032 Chronic diastolic (congestive) heart failure: Secondary | ICD-10-CM

## 2024-05-10 NOTE — Progress Notes (Signed)
 Carelink Summary Report / Loop Recorder

## 2024-05-11 ENCOUNTER — Other Ambulatory Visit: Payer: Self-pay

## 2024-05-11 ENCOUNTER — Ambulatory Visit (HOSPITAL_COMMUNITY)
Admission: RE | Admit: 2024-05-11 | Discharge: 2024-05-11 | Disposition: A | Discharge status: Home / Self Care | Attending: Orthopedic Surgery | Admitting: Orthopedic Surgery

## 2024-05-11 ENCOUNTER — Ambulatory Visit (HOSPITAL_COMMUNITY): Payer: Self-pay | Admitting: Medical

## 2024-05-11 ENCOUNTER — Encounter (HOSPITAL_COMMUNITY): Payer: Self-pay | Admitting: Orthopedic Surgery

## 2024-05-11 ENCOUNTER — Encounter (HOSPITAL_COMMUNITY)
Admission: RE | Disposition: A | Discharge status: Home / Self Care | Payer: Self-pay | Source: Home / Self Care | Attending: Orthopedic Surgery

## 2024-05-11 DIAGNOSIS — I5032 Chronic diastolic (congestive) heart failure: Secondary | ICD-10-CM

## 2024-05-11 DIAGNOSIS — K449 Diaphragmatic hernia without obstruction or gangrene: Secondary | ICD-10-CM | POA: Insufficient documentation

## 2024-05-11 DIAGNOSIS — I110 Hypertensive heart disease with heart failure: Secondary | ICD-10-CM

## 2024-05-11 DIAGNOSIS — M75101 Unspecified rotator cuff tear or rupture of right shoulder, not specified as traumatic: Secondary | ICD-10-CM | POA: Diagnosis present

## 2024-05-11 DIAGNOSIS — Z7901 Long term (current) use of anticoagulants: Secondary | ICD-10-CM | POA: Insufficient documentation

## 2024-05-11 DIAGNOSIS — I2510 Atherosclerotic heart disease of native coronary artery without angina pectoris: Secondary | ICD-10-CM

## 2024-05-11 DIAGNOSIS — M19011 Primary osteoarthritis, right shoulder: Secondary | ICD-10-CM | POA: Diagnosis not present

## 2024-05-11 DIAGNOSIS — G8918 Other acute postprocedural pain: Secondary | ICD-10-CM | POA: Diagnosis not present

## 2024-05-11 DIAGNOSIS — M25711 Osteophyte, right shoulder: Secondary | ICD-10-CM | POA: Diagnosis not present

## 2024-05-11 DIAGNOSIS — M12811 Other specific arthropathies, not elsewhere classified, right shoulder: Secondary | ICD-10-CM

## 2024-05-11 DIAGNOSIS — Z96611 Presence of right artificial shoulder joint: Secondary | ICD-10-CM | POA: Diagnosis not present

## 2024-05-11 DIAGNOSIS — I509 Heart failure, unspecified: Secondary | ICD-10-CM | POA: Diagnosis not present

## 2024-05-11 DIAGNOSIS — I739 Peripheral vascular disease, unspecified: Secondary | ICD-10-CM | POA: Insufficient documentation

## 2024-05-11 DIAGNOSIS — I480 Paroxysmal atrial fibrillation: Secondary | ICD-10-CM | POA: Diagnosis not present

## 2024-05-11 DIAGNOSIS — K219 Gastro-esophageal reflux disease without esophagitis: Secondary | ICD-10-CM | POA: Insufficient documentation

## 2024-05-11 DIAGNOSIS — D6869 Other thrombophilia: Secondary | ICD-10-CM

## 2024-05-11 DIAGNOSIS — Z952 Presence of prosthetic heart valve: Secondary | ICD-10-CM | POA: Diagnosis not present

## 2024-05-11 DIAGNOSIS — G4733 Obstructive sleep apnea (adult) (pediatric): Secondary | ICD-10-CM | POA: Insufficient documentation

## 2024-05-11 LAB — PROTIME-INR
INR: 1 (ref 0.8–1.2)
Prothrombin Time: 14.2 s (ref 11.4–15.2)

## 2024-05-11 SURGERY — ARTHROPLASTY, SHOULDER, TOTAL, REVERSE
Anesthesia: General | Site: Shoulder | Laterality: Right

## 2024-05-11 MED ORDER — DEXAMETHASONE SODIUM PHOSPHATE 10 MG/ML IJ SOLN
INTRAMUSCULAR | Status: DC | PRN
  Administered 2024-05-11: 5 mg via INTRAVENOUS

## 2024-05-11 MED ORDER — FENTANYL CITRATE (PF) 100 MCG/2ML IJ SOLN
INTRAMUSCULAR | Status: DC | PRN
  Administered 2024-05-11: 50 ug via INTRAVENOUS
  Administered 2024-05-11: 100 ug via INTRAVENOUS

## 2024-05-11 MED ORDER — MIDAZOLAM HCL 2 MG/2ML IJ SOLN
1.0000 mg | Freq: Once | INTRAMUSCULAR | Status: AC
  Administered 2024-05-11: 2 mg via INTRAVENOUS

## 2024-05-11 MED ORDER — SUGAMMADEX SODIUM 200 MG/2ML IV SOLN
INTRAVENOUS | Status: AC
  Filled 2024-05-11: qty 2

## 2024-05-11 MED ORDER — METHOCARBAMOL 500 MG PO TABS: 500.0000 mg | ORAL_TABLET | Freq: Four times a day (QID) | ORAL | 0 refills | Status: DC | PRN

## 2024-05-11 MED ORDER — TRANEXAMIC ACID-NACL 1000-0.7 MG/100ML-% IV SOLN
1000.0000 mg | INTRAVENOUS | Status: AC
Start: 2024-05-11 — End: 2024-05-11
  Administered 2024-05-11: 1000 mg via INTRAVENOUS
  Filled 2024-05-11: qty 100

## 2024-05-11 MED ORDER — MUPIROCIN 2 % EX OINT: 1.0000 | TOPICAL_OINTMENT | Freq: Two times a day (BID) | CUTANEOUS | 0 refills | Status: AC

## 2024-05-11 MED ORDER — DEXAMETHASONE SODIUM PHOSPHATE 10 MG/ML IJ SOLN
INTRAMUSCULAR | Status: AC
Start: 2024-05-11 — End: 2024-05-11
  Filled 2024-05-11: qty 1

## 2024-05-11 MED ORDER — ORAL CARE MOUTH RINSE: 15.0000 mL | Freq: Once | OROMUCOSAL | Status: AC

## 2024-05-11 MED ORDER — BUPIVACAINE HCL (PF) 0.5 % IJ SOLN
INTRAMUSCULAR | Status: DC | PRN
Start: 2024-05-11 — End: 2024-05-11
  Administered 2024-05-11: 10 mL via PERINEURAL

## 2024-05-11 MED ORDER — POVIDONE-IODINE 7.5 % EX SOLN: Freq: Once | CUTANEOUS | Status: DC

## 2024-05-11 MED ORDER — PROPOFOL 10 MG/ML IV BOLUS
INTRAVENOUS | Status: DC | PRN
Start: 2024-05-11 — End: 2024-05-11
  Administered 2024-05-11: 160 mg via INTRAVENOUS

## 2024-05-11 MED ORDER — ONDANSETRON HCL 4 MG/2ML IJ SOLN
INTRAMUSCULAR | Status: DC | PRN
  Administered 2024-05-11: 4 mg via INTRAVENOUS

## 2024-05-11 MED ORDER — 0.9 % SODIUM CHLORIDE (POUR BTL) OPTIME
TOPICAL | Status: DC | PRN
  Administered 2024-05-11: 1000 mL

## 2024-05-11 MED ORDER — CHLORHEXIDINE GLUCONATE 4 % EX SOLN: 1.0000 | CUTANEOUS | 1 refills | Status: DC

## 2024-05-11 MED ORDER — LIDOCAINE HCL (PF) 2 % IJ SOLN
INTRAMUSCULAR | Status: DC | PRN
  Administered 2024-05-11: 60 mg via INTRADERMAL

## 2024-05-11 MED ORDER — HYDROMORPHONE HCL 2 MG PO TABS: 2.0000 mg | ORAL_TABLET | Freq: Four times a day (QID) | ORAL | 0 refills | Status: AC | PRN

## 2024-05-11 MED ORDER — EPHEDRINE SULFATE-NACL 50-0.9 MG/10ML-% IV SOSY
PREFILLED_SYRINGE | INTRAVENOUS | Status: DC | PRN
  Administered 2024-05-11: 5 mg via INTRAVENOUS
  Administered 2024-05-11: 10 mg via INTRAVENOUS

## 2024-05-11 MED ORDER — FENTANYL CITRATE PF 50 MCG/ML IJ SOSY
50.0000 ug | PREFILLED_SYRINGE | Freq: Once | INTRAMUSCULAR | Status: DC
Start: 2024-05-11 — End: 2024-05-11
  Filled 2024-05-11: qty 2

## 2024-05-11 MED ORDER — DROPERIDOL 2.5 MG/ML IJ SOLN: 0.6250 mg | Freq: Once | INTRAMUSCULAR | Status: DC | PRN

## 2024-05-11 MED ORDER — LACTATED RINGERS IV SOLN: INTRAVENOUS | Status: DC

## 2024-05-11 MED ORDER — MIDAZOLAM HCL 2 MG/2ML IJ SOLN
INTRAMUSCULAR | Status: AC
  Filled 2024-05-11: qty 2

## 2024-05-11 MED ORDER — ROCURONIUM BROMIDE 10 MG/ML (PF) SYRINGE
PREFILLED_SYRINGE | INTRAVENOUS | Status: DC | PRN
  Administered 2024-05-11: 60 mg via INTRAVENOUS
  Administered 2024-05-11: 10 mg via INTRAVENOUS
  Administered 2024-05-11: 20 mg via INTRAVENOUS

## 2024-05-11 MED ORDER — PROPOFOL 500 MG/50ML IV EMUL
INTRAVENOUS | Status: AC
  Filled 2024-05-11: qty 50

## 2024-05-11 MED ORDER — LIDOCAINE HCL (PF) 2 % IJ SOLN
INTRAMUSCULAR | Status: AC
  Filled 2024-05-11: qty 5

## 2024-05-11 MED ORDER — CEFAZOLIN SODIUM-DEXTROSE 2-4 GM/100ML-% IV SOLN
2.0000 g | INTRAVENOUS | Status: AC
Start: 2024-05-11 — End: 2024-05-11
  Administered 2024-05-11: 2 g via INTRAVENOUS
  Filled 2024-05-11: qty 100

## 2024-05-11 MED ORDER — BUPIVACAINE LIPOSOME 1.3 % IJ SUSP
INTRAMUSCULAR | Status: DC | PRN
  Administered 2024-05-11: 10 mL via PERINEURAL

## 2024-05-11 MED ORDER — SUGAMMADEX SODIUM 200 MG/2ML IV SOLN
INTRAVENOUS | Status: DC | PRN
  Administered 2024-05-11: 200 mg via INTRAVENOUS

## 2024-05-11 MED ORDER — FENTANYL CITRATE PF 50 MCG/ML IJ SOSY: 25.0000 ug | PREFILLED_SYRINGE | INTRAMUSCULAR | Status: DC | PRN

## 2024-05-11 MED ORDER — ONDANSETRON HCL 4 MG/2ML IJ SOLN
INTRAMUSCULAR | Status: AC
  Filled 2024-05-11: qty 2

## 2024-05-11 MED ORDER — LIDOCAINE 2% (20 MG/ML) 5 ML SYRINGE: INTRAMUSCULAR | Status: DC | PRN

## 2024-05-11 MED ORDER — PHENYLEPHRINE HCL-NACL 20-0.9 MG/250ML-% IV SOLN
INTRAVENOUS | Status: DC | PRN
  Administered 2024-05-11: 40 ug/min via INTRAVENOUS

## 2024-05-11 MED ORDER — GLYCOPYRROLATE 0.2 MG/ML IJ SOLN
INTRAMUSCULAR | Status: DC | PRN
  Administered 2024-05-11: .2 mg via INTRAVENOUS

## 2024-05-11 MED ORDER — SODIUM CHLORIDE 0.9 % IR SOLN
Status: DC | PRN
  Administered 2024-05-11: 1000 mL

## 2024-05-11 MED ORDER — ROCURONIUM BROMIDE 10 MG/ML (PF) SYRINGE
PREFILLED_SYRINGE | INTRAVENOUS | Status: AC
Start: 2024-05-11 — End: 2024-05-11
  Filled 2024-05-11: qty 10

## 2024-05-11 MED ORDER — CHLORHEXIDINE GLUCONATE 0.12 % MT SOLN
15.0000 mL | Freq: Once | OROMUCOSAL | Status: AC
  Administered 2024-05-11: 15 mL via OROMUCOSAL

## 2024-05-11 MED ORDER — ONDANSETRON HCL 4 MG PO TABS: 4.0000 mg | ORAL_TABLET | Freq: Three times a day (TID) | ORAL | 0 refills | Status: DC | PRN

## 2024-05-11 MED ORDER — FENTANYL CITRATE (PF) 100 MCG/2ML IJ SOLN
INTRAMUSCULAR | Status: AC
  Filled 2024-05-11: qty 2

## 2024-05-11 MED ORDER — WATER FOR IRRIGATION, STERILE IR SOLN
Status: DC | PRN
  Administered 2024-05-11: 1000 mL

## 2024-05-11 SURGICAL SUPPLY — 63 items
BAG COUNTER SPONGE SURGICOUNT (BAG) IMPLANT
BAG ZIPLOCK 12X15 (MISCELLANEOUS) ×1 IMPLANT
BASEPLATE P2 COATD GLND 6.5X30 (Shoulder) IMPLANT
BIT DRILL 2.5 DIA 127 CALI (BIT) IMPLANT
BIT DRILL 4 DIA CALIBRATED (BIT) IMPLANT
BLADE SAW SGTL 73X25 THK (BLADE) ×1 IMPLANT
BOOTIES KNEE HIGH SLOAN (MISCELLANEOUS) ×2 IMPLANT
COOLER ICEMAN CLASSIC (MISCELLANEOUS) ×1 IMPLANT
COVER BACK TABLE 60X90IN (DRAPES) ×1 IMPLANT
COVER SURGICAL LIGHT HANDLE (MISCELLANEOUS) ×1 IMPLANT
DRAPE INCISE IOBAN 66X45 STRL (DRAPES) ×1 IMPLANT
DRAPE POUCH INSTRU U-SHP 10X18 (DRAPES) ×1 IMPLANT
DRAPE SHEET LG 3/4 BI-LAMINATE (DRAPES) ×1 IMPLANT
DRAPE SURG 17X11 SM STRL (DRAPES) ×1 IMPLANT
DRAPE SURG ORHT 6 SPLT 77X108 (DRAPES) ×2 IMPLANT
DRAPE TOP 10253 STERILE (DRAPES) ×1 IMPLANT
DRAPE U-SHAPE 47X51 STRL (DRAPES) ×1 IMPLANT
DRESSING AQUACEL AG SP 3.5X6 (GAUZE/BANDAGES/DRESSINGS) IMPLANT
DRSG AQUACEL AG ADV 3.5X 6 (GAUZE/BANDAGES/DRESSINGS) ×1 IMPLANT
DURAPREP 26ML APPLICATOR (WOUND CARE) ×2 IMPLANT
ELECT BLADE TIP CTD 4 INCH (ELECTRODE) ×1 IMPLANT
ELECT PENCIL ROCKER SW 15FT (MISCELLANEOUS) ×1 IMPLANT
ELECT REM PT RETURN 15FT ADLT (MISCELLANEOUS) ×1 IMPLANT
FACESHIELD WRAPAROUND OR TEAM (MASK) ×1 IMPLANT
GLOVE BIO SURGEON STRL SZ7.5 (GLOVE) ×1 IMPLANT
GLOVE BIOGEL PI IND STRL 6.5 (GLOVE) ×1 IMPLANT
GLOVE BIOGEL PI IND STRL 8 (GLOVE) ×1 IMPLANT
GLOVE SURG SS PI 6.5 STRL IVOR (GLOVE) ×1 IMPLANT
GOWN STRL REUS W/ TWL LRG LVL3 (GOWN DISPOSABLE) ×1 IMPLANT
GOWN STRL REUS W/ TWL XL LVL3 (GOWN DISPOSABLE) ×1 IMPLANT
HEAD GLENOID W/SCREW 32MM (Shoulder) IMPLANT
HOOD PEEL AWAY T7 (MISCELLANEOUS) ×3 IMPLANT
INSERT EPOLYSTD HUMERUS 36MM (Shoulder) IMPLANT
KIT BASIN OR (CUSTOM PROCEDURE TRAY) ×1 IMPLANT
KIT TURNOVER KIT A (KITS) ×1 IMPLANT
MANIFOLD NEPTUNE II (INSTRUMENTS) ×1 IMPLANT
NDL TROCAR POINT SZ 2 1/2 (NEEDLE) IMPLANT
NEEDLE TROCAR POINT SZ 2 1/2 (NEEDLE) IMPLANT
NS IRRIG 1000ML POUR BTL (IV SOLUTION) ×1 IMPLANT
PACK SHOULDER (CUSTOM PROCEDURE TRAY) ×1 IMPLANT
PAD COLD SHLDR WRAP-ON (PAD) ×1 IMPLANT
RESTRAINT HEAD UNIVERSAL NS (MISCELLANEOUS) ×1 IMPLANT
RETRIEVER SUT HEWSON (MISCELLANEOUS) IMPLANT
SCREW BONE RSP LOCK 5X14 (Screw) IMPLANT
SCREW BONE RSP LOCK 5X30 (Screw) IMPLANT
SCREW BONE RSP LOCK 5X34 (Screw) IMPLANT
SET HNDPC FAN SPRY TIP SCT (DISPOSABLE) ×1 IMPLANT
SLING ARM FOAM STRAP LRG (SOFTGOODS) IMPLANT
SLING ARM FOAM STRAP MED (SOFTGOODS) IMPLANT
STEM HUMERAL 12X48 STD SHORT (Shoulder) IMPLANT
STRIP CLOSURE SKIN 1/2X4 (GAUZE/BANDAGES/DRESSINGS) ×1 IMPLANT
SUCTION TUBE FRAZIER 10FR DISP (SUCTIONS) IMPLANT
SUPPORT WRAP ARM LG (MISCELLANEOUS) ×1 IMPLANT
SUT ETHIBOND 2 V 37 (SUTURE) IMPLANT
SUT MNCRL AB 4-0 PS2 18 (SUTURE) ×1 IMPLANT
SUT VIC AB 2-0 CT1 TAPERPNT 27 (SUTURE) ×2 IMPLANT
SUTURE FIBERWR#2 38 REV NDL BL (SUTURE) IMPLANT
TAP SURG THRD DJ 6.5 (ORTHOPEDIC DISPOSABLE SUPPLIES) IMPLANT
TAPE LABRALWHITE 1.5X36 (TAPE) IMPLANT
TAPE SUT LABRALTAP WHT/BLK (SUTURE) IMPLANT
TOWEL OR 17X26 10 PK STRL BLUE (TOWEL DISPOSABLE) ×1 IMPLANT
TUBE SUCTION HIGH CAP CLEAR NV (SUCTIONS) ×1 IMPLANT
WATER STERILE IRR 1000ML POUR (IV SOLUTION) ×1 IMPLANT
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
## 2024-05-11 NOTE — Discharge Instructions (Signed)
 Discharge Instructions after Reverse Total Shoulder Arthroplasty   A sling has been provided for you. You are to wear this at all times (except for bathing and dressing), until your first post operative visit with Dr. Dozier. Please also wear while sleeping at night. While you bath and dress, let the arm/elbow extend straight down to stretch your elbow. Wiggle your fingers and pump your first while your in the sling to prevent hand swelling. Use ice on the shoulder intermittently over the first 48 hours after surgery. Continue to use ice or and ice machine as needed after 48 hours for pain control/swelling.  Pain medicine has been prescribed for you.  Use your medicine liberally over the first 48 hours, and then you can begin to taper your use. You may take Extra Strength Tylenol  or Tylenol  only in place of the pain pills. DO NOT take ANY nonsteroidal anti-inflammatory pain medications: Advil , Motrin , Ibuprofen , Aleve, Naproxen or Naprosyn.  Take blood thinner medications the day after surgery Leave your dressing on until your first follow up visit.  You may shower with the dressing.  Hold your arm as if you still have your sling on while you shower. Simply allow the water  to wash over the site and then pat dry. Make sure your axilla (armpit) is completely dry after showering.    Please call [phone] during normal business hours or [phone] after hours for any problems. Including the following:  - excessive redness of the incisions - drainage for more than 4 days - fever of more than 101.5 F  *Please note that pain medications will not be refilled after hours or on weekends.    Dental Antibiotics:  In most cases prophylactic antibiotics for Dental procdeures after total joint surgery are not necessary.  Exceptions are as follows:  1. History of prior total joint infection  2. Severely immunocompromised (Organ Transplant, cancer chemotherapy, Rheumatoid biologic meds such as  Humera)  3. Poorly controlled diabetes (A1C &gt; 8.0, blood glucose over 200)  If you have one of these conditions, contact your surgeon for an antibiotic prescription, prior to your dental procedure.

## 2024-05-11 NOTE — Anesthesia Procedure Notes (Signed)
 Anesthesia Regional Block: Interscalene brachial plexus block   Pre-Anesthetic Checklist: , timeout performed,  Correct Patient, Correct Site, Correct Laterality,  Correct Procedure, Correct Position, site marked,  Risks and benefits discussed,  Surgical consent,  Pre-op evaluation,  At surgeon's request and post-op pain management  Laterality: Upper and Right  Prep: chloraprep       Needles:  Injection technique: Single-shot  Needle Type: Stimulator Needle - 40     Needle Length: 4cm  Needle Gauge: 22     Additional Needles:   Procedures:,,,, ultrasound used (permanent image in chart),,    Narrative:  Start time: 05/11/2024 9:03 AM End time: 05/11/2024 9:23 AM Injection made incrementally with aspirations every 5 mL.  Performed by: Personally  Anesthesiologist: Darlyn Rush, MD  Additional Notes: BP cuff, SpO2 and EKG monitors applied. Sedation begun. Nerve location verified with ultrasound. Anesthetic injected incrementally, slowly, and after neg aspirations under direct u/s guidance. Good perineural spread. Tolerated well.

## 2024-05-11 NOTE — Progress Notes (Signed)
 Remote Loop Recorder Transmission

## 2024-05-11 NOTE — Transfer of Care (Signed)
 Immediate Anesthesia Transfer of Care Note  Patient: Mark [name]  Procedure(s) Performed: ARTHROPLASTY, SHOULDER, TOTAL, REVERSE (Right: Shoulder)  Patient Location: PACU  Anesthesia Type:General and Regional  Level of Consciousness: awake, alert , oriented, and patient cooperative  Airway & Oxygen Therapy: Patient Spontanous Breathing and Patient connected to face mask oxygen  Post-op Assessment: Report given to RN and Post -op Vital signs reviewed and stable  Post vital signs: Reviewed and stable  Last Vitals:  Vitals Value Taken Time  BP 134/76 05/11/24 11:02  Temp 36.4 C 05/11/24 11:02  Pulse 51 05/11/24 11:05  Resp 18 05/11/24 11:05  SpO2 91 % 05/11/24 11:05  Vitals shown include unfiled device data.  Last Pain:  Vitals:   05/11/24 1102  TempSrc:   PainSc: 0-No pain         Complications: No notable events documented.

## 2024-05-11 NOTE — Anesthesia Procedure Notes (Signed)
 Procedure Name: Intubation Date/Time: 05/11/2024 9:41 AM  Performed by: Franchot Delon RAMAN, CRNAPre-anesthesia Checklist: Patient identified, Emergency Drugs available, Suction available and Patient being monitored Patient Re-evaluated:Patient Re-evaluated prior to induction Oxygen Delivery Method: Circle System Utilized Preoxygenation: Pre-oxygenation with 100% oxygen Induction Type: IV induction Ventilation: Oral airway inserted - appropriate to patient size Laryngoscope Size: Mac and 4 Grade View: Grade I Tube type: Oral Tube size: 7.5 mm Number of attempts: 1 Airway Equipment and Method: Stylet and Oral airway Placement Confirmation: ETT inserted through vocal cords under direct vision, positive ETCO2 and breath sounds checked- equal and bilateral Secured at: 23 cm Tube secured with: Tape Dental Injury: Teeth and Oropharynx as per pre-operative assessment  Comments: Intubation performed by Ozell Riding, Paramedic student with supervision by Franchot, CRNA.

## 2024-05-11 NOTE — Anesthesia Postprocedure Evaluation (Signed)
 Anesthesia Post Note  Patient: Mark [name]  Procedure(s) Performed: ARTHROPLASTY, SHOULDER, TOTAL, REVERSE (Right: Shoulder)     Patient location during evaluation: PACU Anesthesia Type: General Level of consciousness: sedated and patient cooperative Pain management: pain level controlled Vital Signs Assessment: post-procedure vital signs reviewed and stable Respiratory status: spontaneous breathing Cardiovascular status: stable Anesthetic complications: no   No notable events documented.  Last Vitals:  Vitals:   05/11/24 1136 05/11/24 1200  BP: 108/68 112/86  Pulse: (!) 48 (!) 50  Resp: 16   Temp:    SpO2: 94% 95%    Last Pain:  Vitals:   05/11/24 1200  TempSrc:   PainSc: 0-No pain                 Norleen Pope

## 2024-05-11 NOTE — Evaluation (Signed)
 Occupational Therapy Evaluation Patient Details Name: Mark [name] MRN: [id number] DOB: 1960-10-06 Today's Date: 05/11/2024   History of Present Illness   Mr. 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     Clinical Impressions Pt is s/p shoulder replacement of right dominant upper extremity on 05-11-14. Therapist provided education and instruction to the patient and his spouse with regards to ROM/exercise protocol, post-op precautions, upper extremity and sling positioning, donning upper extremity clothing, RUE NWB status, sling wear schedule, bathing while maintaining shoulder precautions, use of ice for pain and edema management, correct use of ice machine, and correctly donning/doffing sling. Patient and his spouse presented with good understanding, recall, and teach back abilities. Patient needed assistance to donn shirt, shorts, and shoes with instruction provided on compensatory strategies to perform ADLs. Patient to progress rehab of shoulder as ordered by MD at follow-up appointment.        If plan is discharge home, recommend the following:   Assistance with cooking/housework;Assist for transportation;Help with stairs or ramp for entrance;A little help with bathing/dressing/bathroom     Functional Status Assessment   Patient has had a recent decline in their functional status and demonstrates the ability to make significant improvements in function in a reasonable and predictable amount of time.     Equipment Recommendations   Tub/shower seat     Recommendations for Other Services         Precautions/Restrictions   Precautions Precautions: Shoulder Type of Shoulder Precautions: no shoulder ROM, okay to perform elbow, wrist, and hand ROM, sling on at all times except ADLs/exercise Shoulder Interventions: Shoulder sling/immobilizer Precaution Booklet  Issued: Yes (comment) Recall of Precautions/Restrictions: Intact Required Braces or Orthoses: Sling Restrictions Weight Bearing Restrictions Per Provider Order: Yes RUE Weight Bearing Per Provider Order: Non weight bearing     Mobility Bed Mobility      General bed mobility comments: pt was received seated in the bedside chair    Transfers Overall transfer level: Needs assistance Equipment used: None Transfers: Sit to/from Stand Sit to Stand: Contact guard assist           Balance Overall balance assessment: No apparent balance deficits (not formally assessed)       ADL either performed or assessed with clinical judgement   ADL Overall ADL's : Needs assistance/impaired                 Upper Body Dressing : Moderate assistance;Cueing for compensatory techniques;Sitting Upper Body Dressing Details (indicate cue type and reason): He required assist to donn an around the back shirt, as well as RUE sling. OT instructed the pt and his spouse on the proper technique to perform task, given shoulder precautions. Lower Body Dressing: Moderate assistance;Sit to/from stand;Sitting/lateral leans;Cueing for compensatory techniques Lower Body Dressing Details (indicate cue type and reason): Pt required assist to donn his underwear and shoes, with OT providing instruction on compensatory strategies to perform, given his shoulder precautions.                      Pertinent Vitals/Pain Pain Assessment Pain Assessment: No/denies pain     Extremity/Trunk Assessment Upper Extremity Assessment Upper Extremity Assessment: Right hand dominant   Lower Extremity Assessment Lower Extremity Assessment: Overall WFL for tasks assessed       Communication Communication Communication: No apparent difficulties   Cognition Arousal: Alert Behavior  During Therapy: WFL for tasks assessed/performed Cognition: No apparent impairments             OT - Cognition Comments:  Oriented 4        Following commands: Intact       Cueing  General Comments   Cueing Techniques: Verbal cues         Shoulder Instructions Shoulder Instructions Donning/doffing shirt without moving shoulder: Moderate assistance Method for sponge bathing under operated UE:  (Pt and his spouse verbalized understanding) Donning/doffing sling/immobilizer: Moderate assistance Correct positioning of sling/immobilizer: Moderate assistance Pendulum exercises (written home exercise program):  (N/A) ROM for elbow, wrist and digits of operated UE:  (Pt and his spouse verbalized understanding) Sling wearing schedule (on at all times/off for ADL's):  (Pt and his spouse verbalized understanding) Proper positioning of operated UE when showering:  (Pt and his spouse verbalized understanding) Dressing change:  (Pt and his spouse verbalized understanding) Positioning of UE while sleeping:  (Pt and his spouse verbalized understanding)    Home Living Family/patient expects to be discharged to:: Private residence Living Arrangements: Spouse/significant other Available Help at Discharge: Family Type of Home: House Home Access: Stairs to enter Secretary/administrator of Steps: 5 Entrance Stairs-Rails: Right Home Layout: One level     Bathroom Shower/Tub: Tub/shower unit         Home Equipment: Cane - single point          Prior Functioning/Environment Prior Level of Function : Independent/Modified Independent;Driving             Mobility Comments: Independent with ambulation. ADLs Comments: Independent with ADLs.    OT Problem List: Impaired UE functional use;Decreased range of motion;Decreased strength        OT Goals(Current goals can be found in the care plan section)   Acute Rehab OT Goals OT Goal Formulation: All assessment and education complete, DC therapy   AM-PAC OT 6 Clicks Daily Activity     Outcome Measure Help from another person eating meals?:  None Help from another person taking care of personal grooming?: None Help from another person toileting, which includes using toliet, bedpan, or urinal?: A Little Help from another person bathing (including washing, rinsing, drying)?: A Little Help from another person to put on and taking off regular upper body clothing?: A Lot Help from another person to put on and taking off regular lower body clothing?: A Lot 6 Click Score: 18   End of Session Equipment Utilized During Treatment: Other (comment) (N/A) Nurse Communication: Other (comment) (shoulder education completed)  Activity Tolerance: Patient tolerated treatment well Patient left: in chair;with call bell/phone within reach;with family/visitor present  OT Visit Diagnosis: Muscle weakness (generalized) (M62.81)                Time: 8794-8769 OT Time Calculation (min): 25 min Charges:  OT General Charges $OT Visit: 1 Visit OT Evaluation $OT Eval Moderate Complexity: 1 Mod OT Treatments $Self Care/Home Management : 8-22 mins   Delanna JINNY Lesches, OTR/L 05/11/2024, 3:11 PM

## 2024-05-11 NOTE — Op Note (Signed)
 Procedure(s): ARTHROPLASTY, SHOULDER, TOTAL, REVERSE Procedure Note  Mark [name] male 63 y.o. 05/11/2024  Preoperative diagnosis: Right shoulder end-stage rotator cuff tear arthropathy  Postoperative diagnosis: Same  Procedure(s) and Anesthesia Type:    * ARTHROPLASTY, SHOULDER, TOTAL, REVERSE - Choice   Indications:  63 y.o. male  With endstage right shoulder arthritis with irrepairable rotator cuff tear. Pain and dysfunction interfered with quality of life and nonoperative treatment with activity modification, NSAIDS and injections failed.     Surgeon: Mark [name]   Assistants: Jeoffrey Northern PA-C [name] was present and scrubbed throughout the procedure and was essential in positioning, retraction, exposure, and closure)  Anesthesia: General endotracheal anesthesia with preoperative interscalene block given by the attending anesthesiologist    Procedure Detail  ARTHROPLASTY, SHOULDER, TOTAL, REVERSE   Estimated Blood Loss:  200 mL         Drains: none  Blood Given: none          Specimens: none        Complications:  * No complications entered in OR log *         Disposition: PACU - hemodynamically stable.         Condition: stable      OPERATIVE FINDINGS:  A DJO Altivate pressfit reverse total shoulder arthroplasty was placed with a  size 12 stem, a 36 standard glenosphere, and a standard-mm poly insert. The base plate  fixation was excellent.  PROCEDURE: The patient was identified in the preoperative holding area  where I personally marked the operative site after verifying site, side,  and procedure with the patient. An interscalene block given by  the attending anesthesiologist in the holding area and the patient was taken back to the operating room where all extremities were  carefully padded in position after general anesthesia was induced. She  was placed in a beach-chair position and the operative upper extremity was  prepped and  draped in a standard sterile fashion. An approximately 10-  cm incision was made from the tip of the coracoid process to the center  point of the humerus at the level of the axilla. Dissection was carried  down through subcutaneous tissues to the level of the cephalic vein  which was taken laterally with the deltoid. The pectoralis major was  retracted medially. The subdeltoid space was developed and the lateral  edge of the conjoined tendon was identified. The undersurface of  conjoined tendon was palpated and the musculocutaneous nerve was not in  the field. Retractor was placed underneath the conjoined and second  retractor was placed lateral into the deltoid. The circumflex humeral  artery and vessels were identified and clamped and coagulated. The  biceps tendon was absent.  The subscapularis was severely degenerated and thin and taken down as a peel.  The  joint was then gently externally rotated while the capsule was released  from the humeral neck around to just beyond the 6 o'clock position. At  this point, the joint was dislocated and the humeral head was presented  into the wound. The excessive osteophyte formation was removed with a  large rongeur.  The cutting guide was used to make the appropriate  head cut and the head was saved for potentially bone grafting.  The glenoid was exposed with the arm in an  abducted extended position. The anterior and posterior labrum were  completely excised and the capsule was released circumferentially to  allow for exposure of the glenoid for preparation. The 2.5 mm  drill was  placed using the guide in 5-10 inferior angulation and the tap was then advanced in the same hole. Small and large reamers were then used. The tap was then removed and the Metaglene was then screwed in with excellent purchase.  The peripheral guide was then used to drilled measured and filled peripheral locking screws. The size 36 standard glenosphere was then impacted on  the [facility] taper and the central screw was placed. The humerus was then again exposed and the diaphyseal reamers were used followed by the metaphyseal reamers. The final broach was left in place in the proximal trial was placed. The joint was reduced and with this implant it was felt that soft tissue tensioning was appropriate with excellent stability and excellent range of motion. Therefore, final humeral stem was placed press-fit.  And then the trial polyethylene inserts were tested again and the above implant was felt to be the most appropriate for final insertion. The joint was reduced taken through full range of motion and felt to be stable. Soft tissue tension was appropriate.  The joint was then copiously irrigated with pulse  lavage and the wound was then closed. The subscapularis was not repairable.  Skin was closed with 2-0 Vicryl in a deep dermal layer and 4-0  Monocryl for skin closure. Steri-Strips were applied. Sterile  dressings were then applied as well as a sling. The patient was allowed  to awaken from general anesthesia, transferred to stretcher, and taken  to recovery room in stable condition.   POSTOPERATIVE PLAN: The patient will be observed in the recovery room and if his pain is well-controlled with regional anesthesia and he is hemodynamically stable he can be discharged home today with family.

## 2024-05-12 ENCOUNTER — Telehealth: Payer: Self-pay | Admitting: Pharmacist

## 2024-05-12 ENCOUNTER — Encounter (HOSPITAL_COMMUNITY): Payer: Self-pay | Admitting: Orthopedic Surgery

## 2024-05-12 NOTE — Telephone Encounter (Signed)
 Patient called. Had surgery yesterday and released on hydromorphone  and methocarbamol . Advised neither of these medications should interact with his warfarin.

## 2024-05-16 ENCOUNTER — Ambulatory Visit: Attending: Cardiology | Admitting: Pharmacist

## 2024-05-16 DIAGNOSIS — I4891 Unspecified atrial fibrillation: Secondary | ICD-10-CM | POA: Insufficient documentation

## 2024-05-16 DIAGNOSIS — Z7901 Long term (current) use of anticoagulants: Secondary | ICD-10-CM | POA: Diagnosis not present

## 2024-05-16 DIAGNOSIS — Z952 Presence of prosthetic heart valve: Secondary | ICD-10-CM | POA: Insufficient documentation

## 2024-05-16 LAB — POCT INR: INR: 2.5 (ref 2.0–3.0)

## 2024-05-16 NOTE — Progress Notes (Signed)
 Description   INR-2.5; Take 10mg  today, tomorrow, and Thursday (depending on INR value) then continue warfarin 1/2 tablet daily except 1 tablet on Sunday and Wednesday. Follow patient instructions for upcoming procedure Recheck INR on 9/18  Amio 200mg  daily.  Coumadin  Clinic [phone] Cardiac clearance form Fax to [phone]

## 2024-05-16 NOTE — Patient Instructions (Signed)
 Description   INR-2.5; Take 10mg  today, tomorrow, and Thursday (depending on INR value) then continue warfarin 1/2 tablet daily except 1 tablet on Sunday and Wednesday. Follow patient instructions for upcoming procedure Recheck INR on 9/18  Amio 200mg  daily.  Coumadin  Clinic [phone] Cardiac clearance form Fax to [phone]

## 2024-05-18 ENCOUNTER — Ambulatory Visit (INDEPENDENT_AMBULATORY_CARE_PROVIDER_SITE_OTHER)

## 2024-05-18 ENCOUNTER — Ambulatory Visit: Attending: Cardiology | Admitting: Pharmacist

## 2024-05-18 DIAGNOSIS — Z7901 Long term (current) use of anticoagulants: Secondary | ICD-10-CM | POA: Diagnosis not present

## 2024-05-18 DIAGNOSIS — Z952 Presence of prosthetic heart valve: Secondary | ICD-10-CM

## 2024-05-18 DIAGNOSIS — I4891 Unspecified atrial fibrillation: Secondary | ICD-10-CM | POA: Diagnosis not present

## 2024-05-18 LAB — POCT INR: INR: 2.7 (ref 2.0–3.0)

## 2024-05-18 NOTE — Progress Notes (Signed)
 Patient ID: Mark [name]                 DOB: 10/23/1960                    MRN: [id number]     HPI: Mark [name] is a 63 y.o. male patient referred to pharmacy clinic by Harlene Ganong to initiate GLP1-RA therapy. PMH is significant for OSA (AHI 15.9), PAF, bicuspid aortic valve complicated by endocarditis, status post St. Jude mechanical aortic valve replacement in 1983, and obesity. Most recent BMI 36.3 kg/m .  Patient presents today to PharmD clinic. He had shoulder replacement surgery last week. Up until about a month or 2 ago, he was walking about an hour a day. His wife tries to make sure he eats heart healthy. However he does drink a fair amount of Gatorade and 1 soda per day.  We discussed the high sugar content of these.  He is willing to get rid of the Gatorade.  Diet:  Gatorade, water , 1 soda per day Fish, chicken  Exercise: was walking 1 hr until he had surgery  Social History: no ETOH, no tobacco  Labs: Lab Results  Component Value Date   HGBA1C 5.8 08/20/2021    Wt Readings from Last 1 Encounters:  05/11/24 251 lb 6.4 oz (114 kg)    BP Readings from Last 1 Encounters:  05/11/24 112/86   Pulse Readings from Last 1 Encounters:  05/11/24 (!) 50       Component Value Date/Time   CHOL 159 08/20/2021 1139   TRIG 188.0 (H) 08/20/2021 1139   HDL 36.00 (L) 08/20/2021 1139   CHOLHDL 4 08/20/2021 1139   VLDL 37.6 08/20/2021 1139   LDLCALC 86 08/20/2021 1139   LDLDIRECT 166.0 10/18/2015 0916    Past Medical History:  Diagnosis Date   ADHD (attention deficit hyperactivity disorder)    Aortic stenosis    a. due to bicuspid AoV; 1983 S/p mechanical AVR (St. Jude) - chronic coumadin  with INR run between 3.5-4.5;  b. 03/2012 Echo: EF 60%, nl wall motion, mod dil LA.   Arthritis    Ascending aortic aneurysm (HCC)    Atrial tachycardia (HCC)    a. admitted 07/2012   CAD (coronary artery disease)    CHF (congestive heart failure) (HCC)     COVID janurary 2022   Dysrhythmia    a-fib   ED (erectile dysfunction)    Gallstones    GERD (gastroesophageal reflux disease)    Headache(784.0)    Heart murmur    S/P AVR   Hepatitis A    a. as a child (from [employer])   History of hiatal hernia    History of kidney stones    Hypertension    OSA (obstructive sleep apnea)    a. noncompliant with CPAP   Paroxysmal atrial fibrillation (HCC)    s/p afib ablation x 2   Pneumonia     Current Outpatient Medications on File Prior to Visit  Medication Sig Dispense Refill   acetaminophen  (TYLENOL ) 650 MG CR tablet Take 1,300 mg by mouth every 8 (eight) hours as needed for pain.     amiodarone  (PACERONE ) 200 MG tablet TAKE 1 TABLET BY MOUTH EVERY DAY 90 tablet 1   aspirin  EC 81 MG tablet Take 81 mg by mouth at bedtime.      carvedilol  (COREG ) 6.25 MG tablet TAKE 1 TABLET BY MOUTH TWICE DAILY 180 tablet 3   chlorhexidine  (HIBICLENS )  4 % external liquid Apply 15 mLs (1 Application total) topically as directed for 30 doses. Use as directed daily for 5 days every other week for 6 weeks. 946 mL 1   enoxaparin  (LOVENOX ) 120 MG/0.8ML injection Inject 0.8 mLs (120 mg total) into the skin every 12 (twelve) hours. 16 mL 1   FARXIGA  10 MG TABS tablet TAKE 1 TABLET BY MOUTH DAILY BEFORE BREAKFAST. 30 tablet 11   furosemide  (LASIX ) 40 MG tablet Take 1 tablet (40 mg total) by mouth daily. 90 tablet 3   lisinopril  (ZESTRIL ) 40 MG tablet TAKE 1 TABLET BY MOUTH EVERY DAY 30 tablet 11   loratadine (CLARITIN) 10 MG tablet Take 10 mg by mouth daily.     methocarbamol  (ROBAXIN ) 500 MG tablet Take 1 tablet (500 mg total) by mouth every 6 (six) hours as needed. 30 tablet 0   Multiple Vitamin (MULTIVITAMIN WITH MINERALS) TABS tablet Take 1 tablet by mouth daily.     mupirocin  ointment (BACTROBAN ) 2 % Place 1 Application into the nose 2 (two) times daily for 60 doses. Use as directed 2 times daily for 5 days every other week for 6 weeks. 60 g 0   ondansetron   (ZOFRAN ) 4 MG tablet Take 1 tablet (4 mg total) by mouth every 8 (eight) hours as needed for nausea or vomiting. 20 tablet 0   pantoprazole  (PROTONIX ) 40 MG tablet Take 1 tablet (40 mg total) by mouth daily. 30 tablet 1   simvastatin  (ZOCOR ) 20 MG tablet TAKE 1 TABLET BY MOUTH EVERYDAY AT BEDTIME 90 tablet 1   spironolactone  (ALDACTONE ) 25 MG tablet TAKE 1 TABLET BY MOUTH EVERY DAY 90 tablet 1   warfarin (COUMADIN ) 10 MG tablet TAKE 1/2 TO 1 TABLET BY MOUTH DAILY AS DIRECTED BY COUMADIN  CLINIC (Patient taking differently: Take 5-10 mg by mouth See admin instructions. Take 10 mg by mouth on Sunday and Wednesday and take 5 mg on Monday, Tuesday, Thursday, Friday and Saturday) 90 tablet 1   No current facility-administered medications on file prior to visit.    Allergies  Allergen Reactions   Codeine  Nausea And Vomiting   Percocet [Oxycodone -Acetaminophen ] Nausea And Vomiting     Assessment/Plan:  1. Weight loss - Patient has not met goal of at least 5% of body weight loss with comprehensive lifestyle modifications alone in the past 3-6 months. Pharmacotherapy is appropriate to pursue as augmentation. Will start Zepbound 2.5 mg weekly. Confirmed patient has no personal or family history of medullary thyroid  carcinoma (MTC) or Multiple Endocrine Neoplasia syndrome type 2 (MEN 2).  No history of pancreatitis.  Does have a history of gallstones, gallbladder removed.  Patient educated on increased risk.  Injection technique reviewed at today's visit.  Advised patient on common side effects including nausea, diarrhea, dyspepsia, decreased appetite, and fatigue. Counseled patient on reducing meal size and how to titrate medication to minimize side effects. Counseled patient to call if intolerable side effects or if experiencing dehydration, abdominal pain, or dizziness. Along with pharmacotherapy, the patient will follow dietary modifications and aim for at least 150 minutes of moderate-intensity exercise  per week, plus resistance training twice a week (as recommended by the American Heart Association). This resistance training--such as weightlifting, bodyweight exercises, or using resistance bands, adapted to the patient's ability--will help prevent muscle loss.  Follow up in 1-2 days regarding coverage of Zepbound. If therapy is initiated, phone follow-ups will be conducted every 4 weeks for dose titration until the patient reaches the effective therapeutic dose  and target weight.  Anjela Cassara D Dewey Neukam, Pharm.JONETTA SARAN, CPP [facility] of [facility] [address], [place], [place] [address]  Phone: [phone]; Fax: [phone]

## 2024-05-18 NOTE — Progress Notes (Signed)
 INR 2.7 Please see anticoagulation encounter Take 2 tablets today only then continue 0.5 tablet daily, except 1 tablet every Sunday and Wednesday Recheck INR in 1 week  Amio 200mg  daily.  Coumadin  Clinic [phone] Cardiac clearance form Fax to [phone]

## 2024-05-18 NOTE — Patient Instructions (Signed)
 Take 2 tablets today only then continue 0.5 tablet daily, except 1 tablet every Sunday and Wednesday Recheck INR in 1 week  Amio 200mg  daily.  Coumadin  Clinic [phone] Cardiac clearance form Fax to [phone]

## 2024-05-18 NOTE — Patient Instructions (Signed)

## 2024-05-19 ENCOUNTER — Other Ambulatory Visit (HOSPITAL_COMMUNITY): Payer: Self-pay

## 2024-05-19 ENCOUNTER — Telehealth: Payer: Self-pay | Admitting: Pharmacy Technician

## 2024-05-19 MED ORDER — WEGOVY 0.25 MG/0.5ML ~~LOC~~ SOAJ
0.2500 mg | SUBCUTANEOUS | 0 refills | Status: DC
  Filled 2024-05-19 (×2): qty 2, 28d supply, fill #0

## 2024-05-19 NOTE — Telephone Encounter (Signed)
 Wegovy  was approved.  Zepbound denied he has to fail Wegovy  first.  Called patient and left voicemail for him to call back.  Pt called back. RX sent to [facility] for delivery

## 2024-05-19 NOTE — Telephone Encounter (Signed)
 Pharmacy Patient Advocate Encounter  Received notification from [facility] MEDICAID that Prior Authorization for wegovy  has been APPROVED from 05/19/24 to 11/16/24. Ran test claim, Copay is $4.00. This test claim was processed through [facility]- copay amounts may vary at other pharmacies due to pharmacy/plan contracts, or as the patient moves through the different stages of their insurance plan.   PA #/Case ID/Reference #: [id number]

## 2024-05-19 NOTE — Telephone Encounter (Signed)
   Pharmacy Patient Advocate Encounter   Received notification from staff that prior authorization for zepbound is required/requested.   Insurance verification completed.   The patient is insured through [facility] MEDICAID .   Per test claim: PA required; PA submitted to above mentioned insurance via Latent Key/confirmation #/EOC [facility] Status is pending

## 2024-05-19 NOTE — Telephone Encounter (Signed)
 Pharmacy Patient Advocate Encounter  Received notification from [facility] MEDICAID that Prior Authorization for zepbound has been DENIED.  Full denial letter will be uploaded to the media tab. See denial reason below.   PA #/Case ID/Reference #: [id number]    [employer]  needs a prior auth per test claim. I started a prior auth for wegovy  in case you wanted to go that route.   Pharmacy Patient Advocate Encounter   Received notification from Pt Calls Messages that prior authorization for WEGOVY  is required/requested.   Insurance verification completed.   The patient is insured through [facility] MEDICAID .   Per test claim: PA required; PA submitted to above mentioned insurance via Latent Key/confirmation #/EOC [id number] Status is pending

## 2024-05-19 NOTE — Addendum Note (Signed)
 Addended by: Shadrach Bartunek D on: 05/19/2024 04:21 PM   Modules accepted: Orders

## 2024-05-20 ENCOUNTER — Other Ambulatory Visit: Payer: Self-pay | Admitting: Family Medicine

## 2024-05-25 ENCOUNTER — Ambulatory Visit: Attending: Cardiology

## 2024-05-25 DIAGNOSIS — I4891 Unspecified atrial fibrillation: Secondary | ICD-10-CM | POA: Insufficient documentation

## 2024-05-25 DIAGNOSIS — Z952 Presence of prosthetic heart valve: Secondary | ICD-10-CM | POA: Diagnosis not present

## 2024-05-25 DIAGNOSIS — Z7901 Long term (current) use of anticoagulants: Secondary | ICD-10-CM | POA: Insufficient documentation

## 2024-05-25 LAB — POCT INR: INR: 2.3 (ref 2.0–3.0)

## 2024-05-25 NOTE — Patient Instructions (Signed)
 Take 1 tablet today, tomorrow and Saturday only then continue 0.5 tablet daily, except 1 tablet every Sunday and Wednesday Recheck INR in 2 weeks  Amio 200mg  daily.  Coumadin  Clinic [phone] Cardiac clearance form Fax to [phone]

## 2024-05-25 NOTE — Progress Notes (Signed)
 INR 2.3  Please see anticoagulation encounter Take 1 tablet today, tomorrow and Saturday only then continue 0.5 tablet daily, except 1 tablet every Sunday and Wednesday Recheck INR in 2 weeks  Amio 200mg  daily.  Coumadin  Clinic [phone] Cardiac clearance form Fax to [phone]

## 2024-05-26 DIAGNOSIS — M19011 Primary osteoarthritis, right shoulder: Secondary | ICD-10-CM | POA: Diagnosis not present

## 2024-05-29 ENCOUNTER — Ambulatory Visit (INDEPENDENT_AMBULATORY_CARE_PROVIDER_SITE_OTHER): Payer: Self-pay

## 2024-05-29 DIAGNOSIS — D631 Anemia in chronic kidney disease: Secondary | ICD-10-CM | POA: Diagnosis not present

## 2024-05-29 DIAGNOSIS — N1831 Chronic kidney disease, stage 3a: Secondary | ICD-10-CM | POA: Diagnosis not present

## 2024-05-29 DIAGNOSIS — E669 Obesity, unspecified: Secondary | ICD-10-CM | POA: Diagnosis not present

## 2024-05-29 DIAGNOSIS — I4891 Unspecified atrial fibrillation: Secondary | ICD-10-CM

## 2024-05-29 DIAGNOSIS — I5030 Unspecified diastolic (congestive) heart failure: Secondary | ICD-10-CM | POA: Diagnosis not present

## 2024-05-29 DIAGNOSIS — I480 Paroxysmal atrial fibrillation: Secondary | ICD-10-CM | POA: Diagnosis not present

## 2024-05-29 DIAGNOSIS — N179 Acute kidney failure, unspecified: Secondary | ICD-10-CM | POA: Diagnosis not present

## 2024-05-29 DIAGNOSIS — I129 Hypertensive chronic kidney disease with stage 1 through stage 4 chronic kidney disease, or unspecified chronic kidney disease: Secondary | ICD-10-CM | POA: Diagnosis not present

## 2024-05-29 LAB — CUP PACEART REMOTE DEVICE CHECK
Date Time Interrogation Session: 20250928231526
Implantable Pulse Generator Implant Date: 20221031

## 2024-05-31 NOTE — Progress Notes (Signed)
 Remote Loop Recorder Transmission

## 2024-06-01 ENCOUNTER — Ambulatory Visit: Payer: Self-pay | Admitting: Cardiology

## 2024-06-01 ENCOUNTER — Ambulatory Visit: Attending: Family Medicine

## 2024-06-01 ENCOUNTER — Other Ambulatory Visit: Payer: Self-pay

## 2024-06-01 DIAGNOSIS — R600 Localized edema: Secondary | ICD-10-CM | POA: Diagnosis not present

## 2024-06-01 DIAGNOSIS — M25511 Pain in right shoulder: Secondary | ICD-10-CM | POA: Diagnosis not present

## 2024-06-01 DIAGNOSIS — M25611 Stiffness of right shoulder, not elsewhere classified: Secondary | ICD-10-CM | POA: Insufficient documentation
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
## 2024-06-05 ENCOUNTER — Ambulatory Visit

## 2024-06-05 DIAGNOSIS — R600 Localized edema: Secondary | ICD-10-CM

## 2024-06-05 DIAGNOSIS — B351 Tinea unguium: Secondary | ICD-10-CM | POA: Diagnosis not present

## 2024-06-05 DIAGNOSIS — M25611 Stiffness of right shoulder, not elsewhere classified: Secondary | ICD-10-CM

## 2024-06-05 DIAGNOSIS — M25511 Pain in right shoulder: Secondary | ICD-10-CM | POA: Diagnosis not present
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
## 2024-06-08 ENCOUNTER — Ambulatory Visit

## 2024-06-08 DIAGNOSIS — R600 Localized edema: Secondary | ICD-10-CM

## 2024-06-08 DIAGNOSIS — M25611 Stiffness of right shoulder, not elsewhere classified: Secondary | ICD-10-CM | POA: Diagnosis not present

## 2024-06-08 DIAGNOSIS — M25511 Pain in right shoulder: Secondary | ICD-10-CM | POA: Diagnosis not present
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
## 2024-06-09 ENCOUNTER — Ambulatory Visit: Attending: Cardiology | Admitting: *Deleted

## 2024-06-09 DIAGNOSIS — Z952 Presence of prosthetic heart valve: Secondary | ICD-10-CM | POA: Insufficient documentation

## 2024-06-09 DIAGNOSIS — Z7901 Long term (current) use of anticoagulants: Secondary | ICD-10-CM | POA: Insufficient documentation

## 2024-06-09 DIAGNOSIS — I4891 Unspecified atrial fibrillation: Secondary | ICD-10-CM | POA: Diagnosis not present

## 2024-06-09 LAB — POCT INR: INR: 4.3 — AB (ref 2.0–3.0)

## 2024-06-09 NOTE — Patient Instructions (Signed)
 Description   INR-4.3; Continue taking warfarin 1/2 tablet daily, except 1 tablet every Sunday and Wednesday. Recheck INR in 3 weeks  Amio 200mg  daily.  Coumadin  Clinic [phone] Cardiac clearance form Fax to [phone]

## 2024-06-09 NOTE — Progress Notes (Signed)
 Description   INR-4.3; Continue taking warfarin 1/2 tablet daily, except 1 tablet every Sunday and Wednesday. Recheck INR in 3 weeks  Amio 200mg  daily.  Coumadin  Clinic [phone] Cardiac clearance form Fax to [phone]

## 2024-06-12 ENCOUNTER — Telehealth: Payer: Self-pay | Admitting: Pharmacist

## 2024-06-12 NOTE — Telephone Encounter (Signed)
 Called pt and LVM for him to call back. Calling to f/u on [employer] 

## 2024-06-12 NOTE — Telephone Encounter (Signed)
 I spoke with patient.  He is having some nausea and burping and gas.  States he has lost some weight.  He gave his third injection yesterday.  I advised that he call me after his fourth to let me know if he wants to repeat the 0.25 mg dose or if he is feeling much better we can increase.

## 2024-06-13 ENCOUNTER — Ambulatory Visit

## 2024-06-13 DIAGNOSIS — R600 Localized edema: Secondary | ICD-10-CM

## 2024-06-13 DIAGNOSIS — M25611 Stiffness of right shoulder, not elsewhere classified: Secondary | ICD-10-CM

## 2024-06-13 DIAGNOSIS — M25511 Pain in right shoulder: Secondary | ICD-10-CM | POA: Diagnosis not present
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
## 2024-06-15 ENCOUNTER — Ambulatory Visit

## 2024-06-15 DIAGNOSIS — M25611 Stiffness of right shoulder, not elsewhere classified: Secondary | ICD-10-CM

## 2024-06-15 DIAGNOSIS — M25511 Pain in right shoulder: Secondary | ICD-10-CM

## 2024-06-15 DIAGNOSIS — R600 Localized edema: Secondary | ICD-10-CM
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
## 2024-06-17 ENCOUNTER — Other Ambulatory Visit (HOSPITAL_COMMUNITY): Payer: Self-pay | Admitting: Internal Medicine

## 2024-06-17 DIAGNOSIS — Z952 Presence of prosthetic heart valve: Secondary | ICD-10-CM

## 2024-06-17 DIAGNOSIS — I4891 Unspecified atrial fibrillation: Secondary | ICD-10-CM

## 2024-06-20 ENCOUNTER — Ambulatory Visit

## 2024-06-20 DIAGNOSIS — M25611 Stiffness of right shoulder, not elsewhere classified: Secondary | ICD-10-CM

## 2024-06-20 DIAGNOSIS — M25511 Pain in right shoulder: Secondary | ICD-10-CM | POA: Diagnosis not present

## 2024-06-20 DIAGNOSIS — R600 Localized edema: Secondary | ICD-10-CM | POA: Diagnosis not present

## 2024-06-20 NOTE — Telephone Encounter (Signed)
 Pt LVM about Wegovy  dose titration. Called back, N/A LVM to call back.
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
## 2024-06-21 ENCOUNTER — Telehealth: Payer: Self-pay | Admitting: Pharmacy Technician

## 2024-06-21 ENCOUNTER — Encounter: Payer: Self-pay | Admitting: Pharmacist

## 2024-06-21 ENCOUNTER — Other Ambulatory Visit: Payer: Self-pay

## 2024-06-21 ENCOUNTER — Other Ambulatory Visit (HOSPITAL_COMMUNITY): Payer: Self-pay

## 2024-06-21 ENCOUNTER — Telehealth (HOSPITAL_COMMUNITY): Payer: Self-pay | Admitting: Pharmacist

## 2024-06-21 MED ORDER — WEGOVY 0.5 MG/0.5ML ~~LOC~~ SOAJ
0.5000 mg | SUBCUTANEOUS | 0 refills | Status: DC
  Filled 2024-06-21: qty 2, 28d supply, fill #0

## 2024-06-21 NOTE — Addendum Note (Signed)
 Addended by: Merle Cirelli D on: 06/21/2024 01:47 PM   Modules accepted: Orders

## 2024-06-21 NOTE — Telephone Encounter (Signed)
 Pt left VM that he wanted to increase dose. I called pt and LVM. New rx sent

## 2024-06-21 NOTE — Telephone Encounter (Signed)
 Pharmacy Patient Advocate Encounter   Received notification from Physician's Office that prior authorization for Wegovy  0.5 MG is required/requested.   Insurance verification completed.   The patient is insured through [employer].   Per test claim: PA required; PA submitted to above mentioned insurance via Latent Key/confirmation #/EOC [id number] Status is pending

## 2024-06-22 ENCOUNTER — Other Ambulatory Visit (HOSPITAL_COMMUNITY): Payer: Self-pay

## 2024-06-22 ENCOUNTER — Ambulatory Visit

## 2024-06-23 NOTE — Telephone Encounter (Signed)
 Patient called to follow up on PA. Advised we are just waiting to hear back.

## 2024-06-27 ENCOUNTER — Other Ambulatory Visit (HOSPITAL_COMMUNITY): Payer: Self-pay

## 2024-06-27 NOTE — Telephone Encounter (Signed)
 Status still pending.

## 2024-06-28 NOTE — Telephone Encounter (Signed)
 Pharmacy Patient Advocate Encounter  Received notification from [facility] MEDICAID that Prior Authorization for Wegovy  has been DENIED.  See denial reason below. No denial letter attached in CMM. Will attach denial letter to Media tab once received.  Has to have BMI 40 or higher 3 months of documented lifestyle changes previous to start Established CVD- stroke, MI, PAD   Called to have the denial refaxed- will attach once received   PA #/Case ID/Reference #: [id number]

## 2024-06-28 NOTE — Telephone Encounter (Signed)
 Patient was advised that PA was denied due to him not having a BMI greater than 40.  I advised although not confident we will when I would be happy to appeal on his behalf once he gets the denial letter enzymes the appeal request for me can turn it into us  and we can submit an appeals.

## 2024-06-29 ENCOUNTER — Ambulatory Visit (INDEPENDENT_AMBULATORY_CARE_PROVIDER_SITE_OTHER): Payer: Self-pay

## 2024-06-29 ENCOUNTER — Ambulatory Visit: Payer: Self-pay

## 2024-06-29 ENCOUNTER — Ambulatory Visit: Payer: Self-pay | Admitting: Cardiology

## 2024-06-29 DIAGNOSIS — M25511 Pain in right shoulder: Secondary | ICD-10-CM | POA: Diagnosis not present

## 2024-06-29 DIAGNOSIS — R600 Localized edema: Secondary | ICD-10-CM

## 2024-06-29 DIAGNOSIS — M25611 Stiffness of right shoulder, not elsewhere classified: Secondary | ICD-10-CM | POA: Diagnosis not present

## 2024-06-29 DIAGNOSIS — I4891 Unspecified atrial fibrillation: Secondary | ICD-10-CM | POA: Diagnosis not present

## 2024-06-29 LAB — CUP PACEART REMOTE DEVICE CHECK
Date Time Interrogation Session: 20251029231045
Implantable Pulse Generator Implant Date: 20221031
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
## 2024-06-30 ENCOUNTER — Ambulatory Visit: Attending: Cardiology | Admitting: Pharmacist

## 2024-06-30 DIAGNOSIS — Z952 Presence of prosthetic heart valve: Secondary | ICD-10-CM | POA: Diagnosis not present

## 2024-06-30 DIAGNOSIS — Z7901 Long term (current) use of anticoagulants: Secondary | ICD-10-CM | POA: Insufficient documentation

## 2024-06-30 DIAGNOSIS — I4891 Unspecified atrial fibrillation: Secondary | ICD-10-CM | POA: Diagnosis not present

## 2024-06-30 LAB — POCT INR: INR: 3.9 — AB (ref 2.0–3.0)

## 2024-06-30 NOTE — Patient Instructions (Signed)
 Description   INR-3.9 Continue taking warfarin 1/2 tablet daily, except 1 tablet every Sunday and Wednesday. Recheck INR in 4 weeks.  Amio 200mg  daily.  Coumadin  Clinic [phone] Cardiac clearance form Fax to [phone]

## 2024-06-30 NOTE — Progress Notes (Signed)
 Description   INR-3.9 Continue taking warfarin 1/2 tablet daily, except 1 tablet every Sunday and Wednesday. Recheck INR in 4 weeks.  Amio 200mg  daily.  Coumadin  Clinic [phone] Cardiac clearance form Fax to [phone]
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
## 2024-07-04 ENCOUNTER — Ambulatory Visit: Payer: Self-pay | Attending: Orthopedic Surgery

## 2024-07-04 ENCOUNTER — Telehealth: Payer: Self-pay

## 2024-07-04 NOTE — Telephone Encounter (Signed)
 Called patient about NS appointment, spoke with wife who stated he will call us  back.  1st no-show  Mark [name], [name] 07/04/24 9:36 AM

## 2024-07-05 NOTE — Progress Notes (Signed)
 Remote Loop Recorder Transmission

## 2024-07-06 ENCOUNTER — Ambulatory Visit: Payer: Self-pay

## 2024-07-07 ENCOUNTER — Ambulatory Visit: Admitting: Student in an Organized Health Care Education/Training Program

## 2024-07-10 ENCOUNTER — Ambulatory Visit (HOSPITAL_BASED_OUTPATIENT_CLINIC_OR_DEPARTMENT_OTHER)
Admission: RE | Admit: 2024-07-10 | Discharge: 2024-07-10 | Disposition: A | Discharge status: Home / Self Care | Source: Ambulatory Visit | Attending: Student in an Organized Health Care Education/Training Program | Admitting: Student in an Organized Health Care Education/Training Program

## 2024-07-10 ENCOUNTER — Ambulatory Visit: Admitting: Student in an Organized Health Care Education/Training Program

## 2024-07-10 ENCOUNTER — Encounter: Payer: Self-pay | Admitting: Student in an Organized Health Care Education/Training Program

## 2024-07-10 VITALS — BP 103/68 | HR 95 | Temp 97.5°F | Wt 248.0 lb

## 2024-07-10 DIAGNOSIS — R918 Other nonspecific abnormal finding of lung field: Secondary | ICD-10-CM | POA: Diagnosis not present

## 2024-07-10 DIAGNOSIS — Z96611 Presence of right artificial shoulder joint: Secondary | ICD-10-CM | POA: Diagnosis not present

## 2024-07-10 DIAGNOSIS — J22 Unspecified acute lower respiratory infection: Secondary | ICD-10-CM

## 2024-07-10 DIAGNOSIS — I5032 Chronic diastolic (congestive) heart failure: Secondary | ICD-10-CM | POA: Diagnosis not present

## 2024-07-10 DIAGNOSIS — R059 Cough, unspecified: Secondary | ICD-10-CM | POA: Diagnosis not present

## 2024-07-10 DIAGNOSIS — J189 Pneumonia, unspecified organism: Secondary | ICD-10-CM | POA: Insufficient documentation

## 2024-07-10 LAB — POCT INFLUENZA A/B
Influenza A, POC: NEGATIVE
Influenza B, POC: NEGATIVE

## 2024-07-10 LAB — POC COVID19 BINAXNOW: SARS Coronavirus 2 Ag: NEGATIVE

## 2024-07-10 MED ORDER — AMOXICILLIN-POT CLAVULANATE 875-125 MG PO TABS: 1.0000 | ORAL_TABLET | Freq: Two times a day (BID) | ORAL | 0 refills | Status: AC

## 2024-07-10 NOTE — Patient Instructions (Signed)
  VISIT SUMMARY: Today, you came in with a cough, headache, and sore throat that started a few days ago. You also mentioned experiencing night sweats and a decrease in fluid intake. Given your significant cardiac history and previous episodes of pneumonia, we are taking a thorough approach to determine the cause of your symptoms.  YOUR PLAN: -ACUTE RESPIRATORY INFECTION: An acute respiratory infection is a sudden infection that affects your respiratory tract. We have ordered COVID-19 and influenza tests, and a chest x-ray to check for pneumonia. Depending on the results, we will discuss treatment options, including the possibility of antibiotics if necessary.  -CONGESTIVE HEART FAILURE: Congestive heart failure is a condition where your heart doesn't pump blood as well as it should. This may be contributing to the crackling sounds in your lungs. We will continue to monitor and manage this condition.  -ATRIAL FIBRILLATION: Atrial fibrillation is an irregular and often rapid heart rate. You are currently managing this condition with amiodarone  and warfarin, and we will continue with this treatment.  -AORTIC VALVE REPLACEMENT: You have had an aortic valve replacement, which is a surgery to replace a damaged valve in your heart. This is a chronic condition that we will continue to monitor.  -AORTIC ANEURYSM: An aortic aneurysm is an abnormal bulge in the wall of the aorta. This is a chronic condition that we will continue to monitor.  -ASTHMA: Asthma is a condition in which your airways narrow and swell, causing wheezing and difficulty breathing. You are experiencing symptoms, and we will manage this as needed.  INSTRUCTIONS: Please follow up with the COVID-19 and influenza test results as soon as they are available. Additionally, we will review your chest x-ray to determine if you have pneumonia and discuss further treatment options. Continue taking your current medications as prescribed. If your  symptoms worsen or you experience new symptoms, please contact our office immediately.

## 2024-07-10 NOTE — Assessment & Plan Note (Signed)
 3 days of acute illness with systemic symptoms including fever, he has coarse crackles on exam in his left lung.  I think there is high risk here for commune acquired pneumonia.  He has a history of pneumonia as recently as 1 year ago.  He has a number of other comorbidities especially heart disease.  Our viral testing for influenza and COVID were negative.  Will start antibiotics with Augmentin  twice daily.  Should have pretty limited impact on his INR, he has a routine INR check coming up next week.  We talked about supportive care.  Will get a chest x-ray.

## 2024-07-10 NOTE — Progress Notes (Signed)
 Acute Office Visit  Patient ID: Mark [name], male    DOB: [birth year], 63 y.o.   MRN: [id number]  PCP: Mahlon Comer BRAVO, MD  Chief Complaint  Patient presents with   Cough    Cough ,sore throat and cracking in chest     Subjective:     HPI  Discussed the use of AI scribe software for clinical note transcription with the patient, who gave verbal consent to proceed.  History of Present Illness Mark [name] is a 63 year old male with a history of atrial fibrillation and heart failure who presents with cough, headache, and sore throat.  He has been experiencing cough, headache, and sore throat since Friday. He missed a previously scheduled appointment on Friday and rescheduled for today. He slept all day yesterday, which is unusual for him, and experienced night sweats. He has been eating and drinking, though his fluid intake has decreased today.  He has a significant cardiac history, including atrial fibrillation, aortic valve replacement, aortic aneurysm, chronic diastolic heart failure, and congestive heart failure. His current medications include amiodarone , carvedilol , Farxiga , lisinopril , Lasix , warfarin, spironolactone , and simvastatin . He was previously on Wegovy  for weight loss but discontinued it due to insurance issues. He has lost weight independently but has reached a plateau.  He has a history of asthma and has required an inhaler in the past. He has had pneumonia twice in the last two years, with the most recent episode about a year ago. He describes his chest as 'crackly' and experiences wheezing.  He is retired due to health issues and helps care for his two grandchildren. His mother-in-law works at a hospital, which may have exposed him to illness. He denies recent contact with sick individuals other than his wife, who was ill six weeks ago.      Objective:    BP 103/68   Pulse 95   Temp (!) 97.5 F (36.4 C) (Temporal)   Wt 248  lb (112.5 kg)   SpO2 100%   BMI 35.58 kg/m   Physical Exam  Gen: Well-appearing man Ears: Bilateral tympanic membranes are normal with no effusion Mouth: No posterior erythema Neck: No tender cervical lymphadenopathy Heart: Regular, end systolic click Lungs: Unlabored, moderate coarse crackles heard at the left base, mild crackles at the right base.   Results for orders placed or performed in visit on 07/10/24  POCT Influenza A/B  Result Value Ref Range   Influenza A, POC Negative Negative   Influenza B, POC Negative Negative  POC COVID-19  Result Value Ref Range   SARS Coronavirus 2 Ag Negative Negative      Assessment & Plan:   Problem List Items Addressed This Visit       Unprioritized   Chronic diastolic heart failure (HCC)   Chronic and stable.  He has crackles on exam, little hard to tell if this is pulmonary edema or focal infiltrate from pneumonia.  I favor pneumonia given his history.  The rest of his exam looks euvolemic.  Will continue with his current dose of Lasix  40 mg daily.  Oral intake has been adequate to continue with lisinopril  and spironolactone .      Community acquired pneumonia - Primary   3 days of acute illness with systemic symptoms including fever, he has coarse crackles on exam in his left lung.  I think there is high risk here for commune acquired pneumonia.  He has a history of pneumonia as recently as 1 year  ago.  He has a number of other comorbidities especially heart disease.  Our viral testing for influenza and COVID were negative.  Will start antibiotics with Augmentin  twice daily.  Should have pretty limited impact on his INR, he has a routine INR check coming up next week.  We talked about supportive care.  Will get a chest x-ray.      Relevant Medications   amoxicillin -clavulanate (AUGMENTIN ) 875-125 MG tablet     Meds ordered this encounter  Medications   amoxicillin -clavulanate (AUGMENTIN ) 875-125 MG tablet    Sig: Take 1 tablet  by mouth 2 (two) times daily for 7 days.    Dispense:  14 tablet    Refill:  0    Return if symptoms worsen or fail to improve.  Mark Debby Specking, MD [facility] at [facility]

## 2024-07-10 NOTE — Assessment & Plan Note (Signed)
 Chronic and stable.  He has crackles on exam, little hard to tell if this is pulmonary edema or focal infiltrate from pneumonia.  I favor pneumonia given his history.  The rest of his exam looks euvolemic.  Will continue with his current dose of Lasix  40 mg daily.  Oral intake has been adequate to continue with lisinopril  and spironolactone .

## 2024-07-11 ENCOUNTER — Telehealth: Payer: Self-pay | Admitting: *Deleted

## 2024-07-11 NOTE — Telephone Encounter (Signed)
 Patient called an reported he is taking Augmentin  twice a day. Advised that med is safe to take with warfarin and to call back if any new medication changes.

## 2024-07-12 ENCOUNTER — Telehealth: Payer: Self-pay | Admitting: Student in an Organized Health Care Education/Training Program
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
## 2024-07-13 ENCOUNTER — Ambulatory Visit: Payer: Self-pay | Admitting: Student in an Organized Health Care Education/Training Program

## 2024-07-13 ENCOUNTER — Encounter: Payer: Self-pay | Admitting: Family Medicine

## 2024-07-13 MED ORDER — BENZONATATE 200 MG PO CAPS: 200.0000 mg | ORAL_CAPSULE | Freq: Three times a day (TID) | ORAL | 0 refills | Status: AC | PRN

## 2024-07-14 ENCOUNTER — Other Ambulatory Visit (HOSPITAL_COMMUNITY): Payer: Self-pay

## 2024-07-17 ENCOUNTER — Telehealth: Payer: Self-pay

## 2024-07-17 ENCOUNTER — Encounter: Payer: Self-pay | Admitting: Family Medicine

## 2024-07-17 ENCOUNTER — Other Ambulatory Visit (HOSPITAL_COMMUNITY): Payer: Self-pay

## 2024-07-17 NOTE — Telephone Encounter (Signed)
PA request has been submitted

## 2024-07-17 NOTE — Telephone Encounter (Signed)
 Go ahead and submit it. Im prepared to appeal.

## 2024-07-17 NOTE — Telephone Encounter (Signed)
 Pharmacy Patient Advocate Encounter   Received notification from Physician's Office that prior authorization for ZEPBOUND is required/requested.   Insurance verification completed.   The patient is insured through [facility] MEDICAID.   Per test claim: PA required; PA started via CoverMyMeds. KEY [id number] . Please see clinical question(s) below that I am not finding the answer to in their chart and advise.   Prior to therapy, does the patient have a documented baseline BMI of greater than or equal to 40 kg/m2? Plan criteria for Zepbound with OSA requires an initial baseline BMI over 40 kg/m^2. Please advise.

## 2024-07-17 NOTE — Telephone Encounter (Signed)
-----   Message from Eleanor JONETTA Crews sent at 07/14/2024  4:20 PM EST ----- Please try again for Zepbound for OSA. Thanks

## 2024-07-19 MED ORDER — PROMETHAZINE-DM 6.25-15 MG/5ML PO SYRP: 5.0000 mL | ORAL_SOLUTION | Freq: Four times a day (QID) | ORAL | 0 refills | Status: DC | PRN

## 2024-07-19 MED ORDER — DOXYCYCLINE HYCLATE 100 MG PO TABS: 100.0000 mg | ORAL_TABLET | Freq: Two times a day (BID) | ORAL | 0 refills | Status: DC

## 2024-07-19 NOTE — Addendum Note (Signed)
 Addended by: Quinteria Chisum E on: 07/19/2024 02:07 PM   Modules accepted: Orders

## 2024-07-19 NOTE — Telephone Encounter (Signed)
 Pharmacy Patient Advocate Encounter  Received notification from [facility] MEDICAID that Prior Authorization for ZEPBOUND has been DENIED.  Full denial letter will be uploaded to the media tab. See denial reason below.

## 2024-07-20 ENCOUNTER — Ambulatory Visit: Admitting: Family Medicine

## 2024-07-21 ENCOUNTER — Other Ambulatory Visit (HOSPITAL_COMMUNITY): Payer: Self-pay

## 2024-07-21 ENCOUNTER — Telehealth: Payer: Self-pay

## 2024-07-21 NOTE — Telephone Encounter (Signed)
 Pharmacy Patient Advocate Encounter   Received notification from Physician's Office that prior authorization for Doxycycline  100mg  tabs is required/requested.   Insurance verification completed.   The patient is insured through [employer].   Per test claim: The current 7 day co-pay is, $4.00.  No PA needed at this time. This test claim was processed through [facility]- copay amounts may vary at other pharmacies due to pharmacy/plan contracts, or as the patient moves through the different stages of their insurance plan.

## 2024-07-24 ENCOUNTER — Encounter: Payer: Self-pay | Admitting: Pharmacist

## 2024-07-24 NOTE — Telephone Encounter (Signed)
 Appeals faxed 07/24/24

## 2024-07-30 ENCOUNTER — Ambulatory Visit: Attending: Cardiology

## 2024-07-30 DIAGNOSIS — I4891 Unspecified atrial fibrillation: Secondary | ICD-10-CM

## 2024-07-31 ENCOUNTER — Ambulatory Visit

## 2024-07-31 LAB — CUP PACEART REMOTE DEVICE CHECK
Date Time Interrogation Session: 20251129231450
Implantable Pulse Generator Implant Date: 20221031

## 2024-08-01 ENCOUNTER — Ambulatory Visit: Payer: Self-pay | Admitting: Cardiology

## 2024-08-03 ENCOUNTER — Ambulatory Visit: Admitting: Family Medicine

## 2024-08-03 ENCOUNTER — Encounter: Payer: Self-pay | Admitting: Family Medicine

## 2024-08-03 VITALS — BP 118/60 | HR 54 | Temp 98.0°F | Ht 70.0 in | Wt 247.0 lb

## 2024-08-03 DIAGNOSIS — J189 Pneumonia, unspecified organism: Secondary | ICD-10-CM | POA: Diagnosis not present

## 2024-08-03 MED ORDER — PREDNISONE 10 MG PO TABS: ORAL_TABLET | ORAL | 0 refills | Status: AC

## 2024-08-03 MED ORDER — PROMETHAZINE-DM 6.25-15 MG/5ML PO SYRP: 5.0000 mL | ORAL_SOLUTION | Freq: Four times a day (QID) | ORAL | 0 refills | Status: AC | PRN

## 2024-08-03 NOTE — Progress Notes (Signed)
 Remote Loop Recorder Transmission

## 2024-08-03 NOTE — Patient Instructions (Addendum)
 Schedule your complete physical in 4 months START the Prednisone  as directed- 3 pills at the same time x3 days, then 2 pills at the same time x3 days, then 1 pill daily.  Take w/ food  REST! Call with any questions or concerns Stay Safe! Merry Christmas!!!

## 2024-08-03 NOTE — Progress Notes (Signed)
" ° °  Subjective:    Patient ID: Mark [name], male    DOB: [birth year], 63 y.o.   MRN: [id number]  HPI PNA- pt was seen on 11/10 and dx'd w/ PNA.  Was started on Augmentin  twice daily.  Sxs were not improving so we added a round of Doxycycline .  Pt reports feeling much better but cough persists.  Still wheezing at times.   Review of Systems For ROS see HPI     Objective:   Physical Exam Vitals reviewed.  Constitutional:      General: He is not in acute distress.    Appearance: He is not ill-appearing.  HENT:     Head: Normocephalic and atraumatic.  Eyes:     Extraocular Movements: Extraocular movements intact.     Conjunctiva/sclera: Conjunctivae normal.  Cardiovascular:     Rate and Rhythm: Normal rate and regular rhythm.  Pulmonary:     Breath sounds: Wheezing (diffuse audible wheezes) present.     Comments: + hacking cough, almost continuous Musculoskeletal:     Cervical back: Neck supple.  Lymphadenopathy:     Cervical: No cervical adenopathy.  Skin:    General: Skin is warm and dry.  Neurological:     General: No focal deficit present.     Mental Status: He is alert and oriented to person, place, and time.  Psychiatric:        Mood and Affect: Mood normal.        Behavior: Behavior normal.        Thought Content: Thought content normal.           Assessment & Plan:  CAP- new.  Pt has completed rounds of Augmentin  and Doxycycline .  While he is feeling better, he continues to have almost constant hacking cough and audible wheezing.  Start Prednisone  taper.  Cough meds prn.  Reviewed supportive care and red flags that should prompt return.  Pt expressed understanding and is in agreement w/ plan.  "

## 2024-08-07 ENCOUNTER — Ambulatory Visit: Attending: Cardiology

## 2024-08-07 DIAGNOSIS — Z952 Presence of prosthetic heart valve: Secondary | ICD-10-CM | POA: Diagnosis not present

## 2024-08-07 DIAGNOSIS — Z7901 Long term (current) use of anticoagulants: Secondary | ICD-10-CM

## 2024-08-07 DIAGNOSIS — Z96611 Presence of right artificial shoulder joint: Secondary | ICD-10-CM | POA: Diagnosis not present

## 2024-08-07 DIAGNOSIS — I4891 Unspecified atrial fibrillation: Secondary | ICD-10-CM | POA: Diagnosis not present

## 2024-08-07 LAB — POCT INR: INR: 4.7 — AB (ref 2.0–3.0)

## 2024-08-07 NOTE — Patient Instructions (Addendum)
 Description   INR-4.7; Do not take any warfarin today then take warfarin 1/2 tablet daily this week then continue taking warfarin 1/2 tablet daily, except 1 tablet every Sunday and Wednesday. Recheck INR in 2 weeks.  Amio 200mg  daily.  Coumadin  Clinic [phone] Cardiac clearance form Fax to [phone]

## 2024-08-07 NOTE — Progress Notes (Signed)
 Description   INR-4.7; Do not take any warfarin today then take warfarin 1/2 tablet daily this week then continue taking warfarin 1/2 tablet daily, except 1 tablet every Sunday and Wednesday. Recheck INR in 2 weeks.  Amio 200mg  daily.  Coumadin  Clinic [phone] Cardiac clearance form Fax to [phone]

## 2024-08-17 ENCOUNTER — Telehealth: Payer: Self-pay | Admitting: Pharmacy Technician

## 2024-08-17 ENCOUNTER — Other Ambulatory Visit (HOSPITAL_COMMUNITY): Payer: Self-pay

## 2024-08-17 NOTE — Telephone Encounter (Signed)
° ° °  Bmi on 08/03/24 was 35.44  Pharmacy Patient Advocate Encounter   Received notification from staff-melissa that prior authorization for zepbound is required/requested.   Insurance verification completed.   The patient is insured through [facility] MEDICAID.   Per test claim: PA required; PA submitted to above mentioned insurance via Latent Key/confirmation #/EOC [id number] Status is pending

## 2024-08-17 NOTE — Telephone Encounter (Signed)
 Pharmacy Patient Advocate Encounter  Received notification from [facility] MEDICAID that Prior Authorization for zepbound has been APPROVED from 08/17/24 to 02/15/25. Ran test claim, Copay is $4.00. This test claim was processed through [facility]- copay amounts may vary at other pharmacies due to pharmacy/plan contracts, or as the patient moves through the different stages of their insurance plan.   PA #/Case ID/Reference #: [id number]

## 2024-08-21 ENCOUNTER — Other Ambulatory Visit (HOSPITAL_COMMUNITY): Payer: Self-pay

## 2024-08-21 ENCOUNTER — Ambulatory Visit: Attending: Cardiology

## 2024-08-21 DIAGNOSIS — Z952 Presence of prosthetic heart valve: Secondary | ICD-10-CM | POA: Diagnosis present

## 2024-08-21 DIAGNOSIS — I4891 Unspecified atrial fibrillation: Secondary | ICD-10-CM | POA: Diagnosis present

## 2024-08-21 DIAGNOSIS — Z7901 Long term (current) use of anticoagulants: Secondary | ICD-10-CM | POA: Insufficient documentation

## 2024-08-21 LAB — POCT INR: INR: 4.6 — AB (ref 2.0–3.0)

## 2024-08-21 MED ORDER — ZEPBOUND 2.5 MG/0.5ML ~~LOC~~ SOAJ
2.5000 mg | SUBCUTANEOUS | 0 refills | Status: DC
  Filled 2024-08-21: qty 2, 28d supply, fill #0

## 2024-08-21 NOTE — Progress Notes (Signed)
 Description   INR-4.6; Eat an extra serving of greens today and continue taking warfarin 1/2 tablet daily, except 1 tablet every Sunday and Wednesday. Recheck INR in 3 weeks.  Amio 200mg  daily.  Coumadin  Clinic [phone] Cardiac clearance form Fax to [phone]

## 2024-08-21 NOTE — Telephone Encounter (Signed)
 Spoke with patient while he was here for his Coumadin  clinic visit.  Made aware that Zepbound  is now approved.  Rx was sent to [facility] pharmacy so he could pick up today.  Will reach out next month to follow-up on how he is doing.

## 2024-08-21 NOTE — Patient Instructions (Signed)
 Description   INR-4.6; Eat an extra serving of greens today and continue taking warfarin 1/2 tablet daily, except 1 tablet every Sunday and Wednesday. Recheck INR in 3 weeks.  Amio 200mg  daily.  Coumadin  Clinic [phone] Cardiac clearance form Fax to [phone]

## 2024-08-21 NOTE — Addendum Note (Signed)
 Addended by: Zyria Fiscus D on: 08/21/2024 07:57 AM   Modules accepted: Orders

## 2024-08-30 ENCOUNTER — Ambulatory Visit: Payer: Self-pay | Admitting: Cardiovascular Disease

## 2024-08-30 ENCOUNTER — Ambulatory Visit

## 2024-08-30 DIAGNOSIS — I4891 Unspecified atrial fibrillation: Secondary | ICD-10-CM | POA: Diagnosis not present

## 2024-08-30 LAB — CUP PACEART REMOTE DEVICE CHECK
Date Time Interrogation Session: 20251230231035
Implantable Pulse Generator Implant Date: 20221031

## 2024-09-04 NOTE — Progress Notes (Signed)
 Remote Loop Recorder Transmission

## 2024-09-11 ENCOUNTER — Ambulatory Visit: Attending: Cardiology

## 2024-09-11 ENCOUNTER — Other Ambulatory Visit: Payer: Self-pay | Admitting: Surgery

## 2024-09-11 DIAGNOSIS — Z7901 Long term (current) use of anticoagulants: Secondary | ICD-10-CM | POA: Diagnosis present

## 2024-09-11 DIAGNOSIS — Z952 Presence of prosthetic heart valve: Secondary | ICD-10-CM | POA: Insufficient documentation

## 2024-09-11 DIAGNOSIS — I7121 Aneurysm of the ascending aorta, without rupture: Secondary | ICD-10-CM

## 2024-09-11 DIAGNOSIS — I4891 Unspecified atrial fibrillation: Secondary | ICD-10-CM | POA: Insufficient documentation

## 2024-09-11 LAB — POCT INR: INR: 3 (ref 2.0–3.0)

## 2024-09-11 NOTE — Patient Instructions (Signed)
 Take 1 tablet today only then continue taking warfarin 1/2 tablet daily, except 1 tablet every Sunday and Wednesday. Recheck INR in 4 weeks.  Amio 200mg  daily.  Coumadin  Clinic [phone] Cardiac clearance form Fax to [phone]

## 2024-09-11 NOTE — Progress Notes (Signed)
"   INR 3.0 Please see anticoagulation encounter Take 1 tablet today only then continue taking warfarin 1/2 tablet daily, except 1 tablet every Sunday and Wednesday. Recheck INR in 4 weeks.  Amio 200mg  daily.  Coumadin  Clinic [phone] Cardiac clearance form Fax to [phone] "

## 2024-09-17 ENCOUNTER — Other Ambulatory Visit (HOSPITAL_COMMUNITY): Payer: Self-pay | Admitting: Internal Medicine

## 2024-09-18 ENCOUNTER — Other Ambulatory Visit (HOSPITAL_COMMUNITY): Payer: Self-pay

## 2024-09-18 MED ORDER — ZEPBOUND 5 MG/0.5ML ~~LOC~~ SOAJ
5.0000 mg | SUBCUTANEOUS | 0 refills | Status: AC
  Filled 2024-09-18 – 2024-09-20 (×2): qty 2, 28d supply, fill #0

## 2024-09-20 ENCOUNTER — Other Ambulatory Visit (HOSPITAL_COMMUNITY): Payer: Self-pay

## 2024-09-21 ENCOUNTER — Other Ambulatory Visit (HOSPITAL_COMMUNITY): Payer: Self-pay

## 2024-09-24 ENCOUNTER — Encounter: Payer: Self-pay | Admitting: Family Medicine

## 2024-09-25 ENCOUNTER — Encounter: Payer: Self-pay | Admitting: Pharmacy Technician

## 2024-09-25 ENCOUNTER — Other Ambulatory Visit (HOSPITAL_COMMUNITY): Payer: Self-pay

## 2024-09-25 ENCOUNTER — Other Ambulatory Visit: Payer: Self-pay

## 2024-09-25 ENCOUNTER — Telehealth: Admitting: Student in an Organized Health Care Education/Training Program

## 2024-09-25 ENCOUNTER — Encounter: Payer: Self-pay | Admitting: Student in an Organized Health Care Education/Training Program

## 2024-09-25 VITALS — BP 118/60 | Ht 70.0 in | Wt 237.0 lb

## 2024-09-25 DIAGNOSIS — J189 Pneumonia, unspecified organism: Secondary | ICD-10-CM

## 2024-09-25 DIAGNOSIS — J45909 Unspecified asthma, uncomplicated: Secondary | ICD-10-CM | POA: Insufficient documentation

## 2024-09-25 DIAGNOSIS — J4541 Moderate persistent asthma with (acute) exacerbation: Secondary | ICD-10-CM

## 2024-09-25 MED ORDER — BUDESONIDE-FORMOTEROL FUMARATE 80-4.5 MCG/ACT IN AERO
2.0000 | INHALATION_SPRAY | Freq: Two times a day (BID) | RESPIRATORY_TRACT | 3 refills | Status: AC
  Filled 2024-09-25 (×2): qty 10.2, 30d supply, fill #0

## 2024-09-25 MED ORDER — DOXYCYCLINE HYCLATE 100 MG PO TABS
100.0000 mg | ORAL_TABLET | Freq: Two times a day (BID) | ORAL | 0 refills | Status: AC
  Filled 2024-09-25 (×2): qty 14, 7d supply, fill #0

## 2024-09-25 MED ORDER — AMOXICILLIN-POT CLAVULANATE 875-125 MG PO TABS
1.0000 | ORAL_TABLET | Freq: Two times a day (BID) | ORAL | 0 refills | Status: AC
  Filled 2024-09-25 (×2): qty 14, 7d supply, fill #0

## 2024-09-25 NOTE — Assessment & Plan Note (Signed)
 He reports a possible history of asthma, said he was called asthmatic in the past.  No pulmonary function tests available.  In December he was treated with prednisone  by his PCP for respiratory tract infection.  He has albuterol  inhaler at home, not helping as much.  I am going to start him on Symbicort  twice daily.  Once he recovers from this acute respiratory tract infection, would recommend formal pulmonary function testing.

## 2024-09-25 NOTE — Telephone Encounter (Signed)
 Scheduled a virtual visit with Dr. Jerrell today

## 2024-09-25 NOTE — Progress Notes (Signed)
 "                    MyChart Video Visit    Virtual Visit via Video Note   This format is felt to be most appropriate for this patient at this time. Physical exam was limited by quality of the video and audio technology used for the visit.    Patient location: home Provider location: [facility] Persons involved in the visit: patient, provider  I discussed the limitations of evaluation and management by telemedicine and the availability of in person appointments. The patient expressed understanding and agreed to proceed.  Patient: Mark [name]   DOB: 1960-12-20   64 y.o. Male  MRN: [id number] Visit Date: 09/25/2024  Today's healthcare provider: Cleatus Debby Specking, MD   Chief Complaint  Patient presents with   Acute Visit    Sore throat. Cough and wheezing in right lung    Subjective:    HPI  Discussed the use of AI scribe software for clinical note transcription with the patient, who gave verbal consent to proceed.  History of Present Illness Mark [name] is a 64 year old male with asthma who presents with wheezing and sore throat.  He has been experiencing significant wheezing in the right lung, a sore throat, and a nonproductive cough for the past three to four days. These symptoms have been progressively worsening.  He denies any recent exposure to individuals with the flu or similar illnesses and has not experienced any fever. He monitors his weight daily, which has remained stable at 237 pounds. No swelling in the legs, and he is eating and drinking normally.  He is worried about the possibility of pneumonia because his symptoms feel similar to his previous episode in November. He has been mostly indoors for the past few days and has not experienced any choking sensations or coughing while eating.  He has a history of asthma and uses an albuterol  inhaler, which helps alleviate his symptoms.     Objective:    BP 118/60   Ht 5' 10 (1.778 m)   Wt 237 lb (107.5 kg)   BMI 34.01 kg/m    Physical Exam   Gen: Well-appearing man Lungs: Unlabored, normal work of breathing, frequent productive cough Neck: No JVD Ext: Hide the patient press down on his lower anterior legs, there was no lower extremity edema     Assessment & Plan:    Problem List Items Addressed This Visit       Unprioritized   Community acquired pneumonia - Primary   Suspected recurrence, second episode in two months, with symptoms of wheezing, sore throat and productive cough.  It is unclear to me why he has a second recurrence of commune acquired pneumonia in just 2 months.  He has a possible history of underlying asthma.  Denies any issues with aspiration events.  But I am starting to suspect some type of obstructive structural lung disease as the cause.  Chest x-ray not available today because of severe local weather.  Because of recent antibiotic use, I am going to prescribe Augmentin  and doxycycline  for 7 days.  He is planned for a CT angiogram of the chest to follow-up on a thoracic aortic aneurysm on 2/5.  This is good timing to give us  a look at his airways and rule out a chronic bronchial obstruction as the cause of these recurrent pneumonias.      Relevant Medications  amoxicillin -clavulanate (AUGMENTIN ) 875-125 MG tablet   doxycycline  (VIBRA -TABS) 100 MG tablet   budesonide -formoterol  (SYMBICORT ) 80-4.5 MCG/ACT inhaler   Asthma   He reports a possible history of asthma, said he was called asthmatic in the past.  No pulmonary function tests available.  In December he was treated with prednisone  by his PCP for respiratory tract infection.  He has albuterol  inhaler at home, not helping as much.  I am going to start him on Symbicort  twice daily.  Once he recovers from this acute respiratory tract infection, would recommend formal pulmonary function testing.      Relevant Medications    budesonide -formoterol  (SYMBICORT ) 80-4.5 MCG/ACT inhaler    Meds ordered this encounter  Medications   amoxicillin -clavulanate (AUGMENTIN ) 875-125 MG tablet    Sig: Take 1 tablet by mouth 2 (two) times daily for 7 days.    Dispense:  14 tablet    Refill:  0   doxycycline  (VIBRA -TABS) 100 MG tablet    Sig: Take 1 tablet (100 mg total) by mouth 2 (two) times daily for 7 days.    Dispense:  14 tablet    Refill:  0   budesonide -formoterol  (SYMBICORT ) 80-4.5 MCG/ACT inhaler    Sig: Inhale 2 puffs into the lungs 2 (two) times daily.    Dispense:  10.2 g    Refill:  3     Return if symptoms worsen or fail to improve.     I discussed the assessment and treatment plan with the patient. The patient was provided an opportunity to ask questions and all were answered. The patient agreed with the plan and demonstrated an understanding of the instructions.   The patient was advised to call back or seek an in-person evaluation if the symptoms worsen or if the condition fails to improve as anticipated.  I provided 15 minutes of non-face-to-face time during this encounter.  Cleatus Debby Specking, MD [facility]    "

## 2024-09-25 NOTE — Assessment & Plan Note (Signed)
 Suspected recurrence, second episode in two months, with symptoms of wheezing, sore throat and productive cough.  It is unclear to me why he has a second recurrence of commune acquired pneumonia in just 2 months.  He has a possible history of underlying asthma.  Denies any issues with aspiration events.  But I am starting to suspect some type of obstructive structural lung disease as the cause.  Chest x-ray not available today because of severe local weather.  Because of recent antibiotic use, I am going to prescribe Augmentin  and doxycycline  for 7 days.  He is planned for a CT angiogram of the chest to follow-up on a thoracic aortic aneurysm on 2/5.  This is good timing to give us  a look at his airways and rule out a chronic bronchial obstruction as the cause of these recurrent pneumonias.

## 2024-09-25 NOTE — Patient Instructions (Addendum)
" °  VISIT SUMMARY: During your visit, we discussed your symptoms of wheezing, sore throat, and nonproductive cough. We addressed your concerns about a possible recurrence of pneumonia and reviewed your asthma and heart condition.  YOUR PLAN: -COMMUNITY ACQUIRED PNEUMONIA: Community acquired pneumonia is an infection of the lungs acquired outside of a hospital setting. Given your symptoms and history, we suspect a recurrence. We have prescribed doxycycline  to be taken twice daily and Augmentin  to be taken once daily for 5-7 days. Please update us  if your symptoms persist or worsen.  -CHRONIC DIASTOLIC HEART FAILURE: Chronic diastolic heart failure is a condition where the heart has difficulty relaxing and filling with blood. Currently, there are no signs of fluid overload, and your weight is stable with no leg swelling or shortness of breath on exertion.  -ASTHMA: Asthma is a condition that causes your airways to become inflamed and narrow, leading to wheezing and difficulty breathing. You are currently using an albuterol  inhaler for relief. We have prescribed a Symbicort  inhaler to be used twice daily to help control inflammation.  INSTRUCTIONS: Please take the prescribed medications as directed. If your symptoms persist or worsen, contact our office. Continue monitoring your weight and report any significant changes. Use the Symbicort  inhaler twice daily as prescribed for your asthma. Follow up with us  if you experience any new or worsening symptoms.    Contains text generated by [employer].   "

## 2024-09-30 ENCOUNTER — Ambulatory Visit: Attending: Cardiovascular Disease

## 2024-09-30 DIAGNOSIS — I4891 Unspecified atrial fibrillation: Secondary | ICD-10-CM

## 2024-10-02 ENCOUNTER — Other Ambulatory Visit (HOSPITAL_COMMUNITY): Payer: Self-pay | Admitting: Internal Medicine

## 2024-10-02 LAB — CUP PACEART REMOTE DEVICE CHECK
Date Time Interrogation Session: 20260130230612
Implantable Pulse Generator Implant Date: 20221031

## 2024-10-05 ENCOUNTER — Ambulatory Visit (HOSPITAL_COMMUNITY)

## 2024-10-05 ENCOUNTER — Encounter (HOSPITAL_COMMUNITY): Payer: Self-pay

## 2024-10-06 NOTE — Progress Notes (Signed)
 Remote Loop Recorder Transmission

## 2024-10-09 ENCOUNTER — Ambulatory Visit (HOSPITAL_COMMUNITY)

## 2024-10-09 ENCOUNTER — Ambulatory Visit

## 2024-10-10 ENCOUNTER — Ambulatory Visit: Admitting: Family Medicine

## 2024-10-11 ENCOUNTER — Ambulatory Visit

## 2024-10-31 ENCOUNTER — Ambulatory Visit
